# Patient Record
Sex: Female | Born: 1975
Health system: Southern US, Community
[De-identification: ages and names within clinical notes are randomized; demographics above are authoritative.]

## PROBLEM LIST (undated history)

## (undated) DIAGNOSIS — D509 Iron deficiency anemia, unspecified: Secondary | ICD-10-CM

## (undated) DIAGNOSIS — F419 Anxiety disorder, unspecified: Secondary | ICD-10-CM

## (undated) DIAGNOSIS — K5909 Other constipation: Secondary | ICD-10-CM

## (undated) DIAGNOSIS — E559 Vitamin D deficiency, unspecified: Secondary | ICD-10-CM

## (undated) DIAGNOSIS — E039 Hypothyroidism, unspecified: Secondary | ICD-10-CM

## (undated) DIAGNOSIS — G43909 Migraine, unspecified, not intractable, without status migrainosus: Secondary | ICD-10-CM

## (undated) DIAGNOSIS — T7840XA Allergy, unspecified, initial encounter: Secondary | ICD-10-CM

## (undated) DIAGNOSIS — L509 Urticaria, unspecified: Secondary | ICD-10-CM

## (undated) HISTORY — DX: Iron deficiency anemia, unspecified: D50.9

## (undated) HISTORY — DX: Vitamin D deficiency, unspecified: E55.9

## (undated) HISTORY — DX: Anxiety disorder, unspecified: F41.9

## (undated) HISTORY — DX: Allergy, unspecified, initial encounter: T78.40XA

## (undated) HISTORY — PX: NOSE SURGERY: SHX723

## (undated) HISTORY — DX: Urticaria, unspecified: L50.9

## (undated) HISTORY — DX: Hypothyroidism, unspecified: E03.9

## (undated) HISTORY — PX: BREAST BIOPSY: SHX20

---

## 2006-11-01 ENCOUNTER — Emergency Department (HOSPITAL_COMMUNITY): Admission: EM | Admit: 2006-11-01 | Discharge: 2006-11-01 | Payer: Self-pay | Admitting: Emergency Medicine

## 2006-11-16 ENCOUNTER — Other Ambulatory Visit: Admission: RE | Admit: 2006-11-16 | Discharge: 2006-11-16 | Payer: Self-pay | Admitting: Gynecology

## 2006-11-21 ENCOUNTER — Encounter: Admission: RE | Admit: 2006-11-21 | Discharge: 2006-11-21 | Payer: Self-pay | Admitting: Gynecology

## 2008-04-17 ENCOUNTER — Emergency Department (HOSPITAL_COMMUNITY): Admission: EM | Admit: 2008-04-17 | Discharge: 2008-04-17 | Payer: Self-pay | Admitting: Emergency Medicine

## 2010-05-06 ENCOUNTER — Encounter: Admission: RE | Admit: 2010-05-06 | Discharge: 2010-05-06 | Payer: Self-pay | Admitting: Infectious Diseases

## 2010-07-09 ENCOUNTER — Encounter: Admission: RE | Admit: 2010-07-09 | Discharge: 2010-07-09 | Payer: Self-pay | Admitting: Infectious Diseases

## 2011-08-04 LAB — URINALYSIS, ROUTINE W REFLEX MICROSCOPIC
Bilirubin Urine: NEGATIVE
Glucose, UA: NEGATIVE
Hgb urine dipstick: NEGATIVE
Ketones, ur: 15 — AB
Nitrite: NEGATIVE
Protein, ur: NEGATIVE
Specific Gravity, Urine: 1.015
Urobilinogen, UA: 0.2
pH: 6.5

## 2012-07-24 ENCOUNTER — Encounter (HOSPITAL_COMMUNITY): Payer: Self-pay

## 2012-07-24 ENCOUNTER — Emergency Department (HOSPITAL_COMMUNITY)
Admission: EM | Admit: 2012-07-24 | Discharge: 2012-07-24 | Disposition: A | Discharge status: Home / Self Care | Payer: Self-pay | Attending: Emergency Medicine | Admitting: Emergency Medicine

## 2012-07-24 DIAGNOSIS — K5900 Constipation, unspecified: Secondary | ICD-10-CM | POA: Insufficient documentation

## 2012-07-24 DIAGNOSIS — N390 Urinary tract infection, site not specified: Secondary | ICD-10-CM | POA: Insufficient documentation

## 2012-07-24 DIAGNOSIS — Z809 Family history of malignant neoplasm, unspecified: Secondary | ICD-10-CM | POA: Insufficient documentation

## 2012-07-24 DIAGNOSIS — Z8489 Family history of other specified conditions: Secondary | ICD-10-CM | POA: Insufficient documentation

## 2012-07-24 DIAGNOSIS — R112 Nausea with vomiting, unspecified: Secondary | ICD-10-CM | POA: Insufficient documentation

## 2012-07-24 DIAGNOSIS — Z888 Allergy status to other drugs, medicaments and biological substances status: Secondary | ICD-10-CM | POA: Insufficient documentation

## 2012-07-24 DIAGNOSIS — Z8249 Family history of ischemic heart disease and other diseases of the circulatory system: Secondary | ICD-10-CM | POA: Insufficient documentation

## 2012-07-24 DIAGNOSIS — R109 Unspecified abdominal pain: Secondary | ICD-10-CM | POA: Insufficient documentation

## 2012-07-24 HISTORY — DX: Migraine, unspecified, not intractable, without status migrainosus: G43.909

## 2012-07-24 LAB — CBC WITH DIFFERENTIAL/PLATELET
Basophils Absolute: 0 10*3/uL (ref 0.0–0.1)
Basophils Relative: 0 % (ref 0–1)
Eosinophils Absolute: 0.1 10*3/uL (ref 0.0–0.7)
Eosinophils Relative: 0 % (ref 0–5)
HCT: 40 % (ref 36.0–46.0)
Hemoglobin: 13.8 g/dL (ref 12.0–15.0)
Lymphocytes Relative: 9 % — ABNORMAL LOW (ref 12–46)
Lymphs Abs: 1 10*3/uL (ref 0.7–4.0)
MCH: 30.8 pg (ref 26.0–34.0)
MCHC: 34.5 g/dL (ref 30.0–36.0)
MCV: 89.3 fL (ref 78.0–100.0)
Monocytes Absolute: 1 10*3/uL (ref 0.1–1.0)
Monocytes Relative: 9 % (ref 3–12)
Neutro Abs: 9.1 10*3/uL — ABNORMAL HIGH (ref 1.7–7.7)
Neutrophils Relative %: 82 % — ABNORMAL HIGH (ref 43–77)
Platelets: 240 10*3/uL (ref 150–400)
RBC: 4.48 MIL/uL (ref 3.87–5.11)
RDW: 12.1 % (ref 11.5–15.5)
WBC: 11.2 10*3/uL — ABNORMAL HIGH (ref 4.0–10.5)

## 2012-07-24 LAB — COMPREHENSIVE METABOLIC PANEL
ALT: 16 U/L (ref 0–35)
AST: 17 U/L (ref 0–37)
Albumin: 4 g/dL (ref 3.5–5.2)
Alkaline Phosphatase: 98 U/L (ref 39–117)
BUN: 10 mg/dL (ref 6–23)
CO2: 28 mEq/L (ref 19–32)
Calcium: 8.9 mg/dL (ref 8.4–10.5)
Chloride: 94 mEq/L — ABNORMAL LOW (ref 96–112)
Creatinine, Ser: 0.57 mg/dL (ref 0.50–1.10)
GFR calc Af Amer: >90 mL/min (ref >90)
GFR calc non Af Amer: >90 mL/min (ref >90)
Glucose, Bld: 131 mg/dL — ABNORMAL HIGH (ref 70–99)
Potassium: 3.5 mEq/L (ref 3.5–5.1)
Sodium: 134 mEq/L — ABNORMAL LOW (ref 135–145)
Total Bilirubin: 1.4 mg/dL — ABNORMAL HIGH (ref 0.3–1.2)
Total Protein: 7.6 g/dL (ref 6.0–8.3)

## 2012-07-24 LAB — URINALYSIS, MICROSCOPIC ONLY
Glucose, UA: NEGATIVE mg/dL
Hgb urine dipstick: NEGATIVE
Ketones, ur: >80 mg/dL — AB
Nitrite: NEGATIVE
Protein, ur: 30 mg/dL — AB
Specific Gravity, Urine: 1.025 (ref 1.005–1.030)
Urobilinogen, UA: 1 mg/dL (ref 0.0–1.0)
pH: 6 (ref 5.0–8.0)

## 2012-07-24 LAB — LIPASE, BLOOD: Lipase: 15 U/L (ref 11–59)

## 2012-07-24 LAB — POCT PREGNANCY, URINE: Preg Test, Ur: NEGATIVE

## 2012-07-24 MED ORDER — DEXTROSE 5 % AND 0.9 % NACL IV BOLUS
1000.0000 mL | Freq: Once | INTRAVENOUS | Status: AC
  Administered 2012-07-24: 1000 mL via INTRAVENOUS

## 2012-07-24 MED ORDER — CIPROFLOXACIN HCL 500 MG PO TABS: 500.0000 mg | ORAL_TABLET | Freq: Two times a day (BID) | ORAL | Status: DC

## 2012-07-24 MED ORDER — SODIUM CHLORIDE 0.9 % IV BOLUS (SEPSIS)
1000.0000 mL | Freq: Once | INTRAVENOUS | Status: DC
  Administered 2012-07-24: 1000 mL via INTRAVENOUS

## 2012-07-24 MED ORDER — HYDROMORPHONE HCL PF 1 MG/ML IJ SOLN
1.0000 mg | Freq: Once | INTRAMUSCULAR | Status: AC
  Administered 2012-07-24: 1 mg via INTRAVENOUS
  Filled 2012-07-24: qty 1

## 2012-07-24 MED ORDER — PANTOPRAZOLE SODIUM 40 MG IV SOLR
40.0000 mg | Freq: Once | INTRAVENOUS | Status: AC
  Administered 2012-07-24: 40 mg via INTRAVENOUS
  Filled 2012-07-24: qty 40

## 2012-07-24 MED ORDER — DEXTROSE 5 % IV SOLN
1.0000 g | Freq: Once | INTRAVENOUS | Status: AC
  Administered 2012-07-24: 1 g via INTRAVENOUS
  Filled 2012-07-24: qty 10

## 2012-07-24 MED ORDER — ONDANSETRON 8 MG/NS 50 ML IVPB
8.0000 mg | Freq: Once | INTRAVENOUS | Status: AC
  Administered 2012-07-24: 8 mg via INTRAVENOUS
  Filled 2012-07-24: qty 8

## 2012-07-24 MED ORDER — ONDANSETRON HCL 4 MG PO TABS: 4.0000 mg | ORAL_TABLET | Freq: Four times a day (QID) | ORAL | Status: DC | PRN

## 2012-07-24 MED ORDER — ONDANSETRON HCL 4 MG/2ML IJ SOLN
4.0000 mg | Freq: Once | INTRAMUSCULAR | Status: AC
  Administered 2012-07-24: 4 mg via INTRAVENOUS
  Filled 2012-07-24: qty 2

## 2012-07-24 NOTE — ED Notes (Addendum)
Pt states nausea is lessened.  Asked for water.  Gave water, will see if pt able to tolerate

## 2012-07-24 NOTE — ED Notes (Signed)
Pt presents with no acute distress.  Pt reports vomiting since Sunday.  Denies Diarrhea and fever-  Epigastric pain

## 2012-07-25 ENCOUNTER — Encounter (HOSPITAL_COMMUNITY): Payer: Self-pay | Admitting: *Deleted

## 2012-07-25 ENCOUNTER — Emergency Department (HOSPITAL_COMMUNITY): Payer: Self-pay

## 2012-07-25 ENCOUNTER — Inpatient Hospital Stay (HOSPITAL_COMMUNITY)
Admission: EM | Admit: 2012-07-25 | Discharge: 2012-07-26 | DRG: 392 | Disposition: A | Discharge status: Home / Self Care | Payer: MEDICAID | Attending: Internal Medicine | Admitting: Internal Medicine

## 2012-07-25 DIAGNOSIS — R51 Headache: Secondary | ICD-10-CM | POA: Diagnosis present

## 2012-07-25 DIAGNOSIS — Z79899 Other long term (current) drug therapy: Secondary | ICD-10-CM

## 2012-07-25 DIAGNOSIS — R109 Unspecified abdominal pain: Secondary | ICD-10-CM

## 2012-07-25 DIAGNOSIS — K838 Other specified diseases of biliary tract: Secondary | ICD-10-CM

## 2012-07-25 DIAGNOSIS — K5900 Constipation, unspecified: Secondary | ICD-10-CM | POA: Diagnosis present

## 2012-07-25 DIAGNOSIS — A088 Other specified intestinal infections: Principal | ICD-10-CM | POA: Diagnosis present

## 2012-07-25 DIAGNOSIS — K2970 Gastritis, unspecified, without bleeding: Secondary | ICD-10-CM

## 2012-07-25 DIAGNOSIS — E876 Hypokalemia: Secondary | ICD-10-CM

## 2012-07-25 DIAGNOSIS — R112 Nausea with vomiting, unspecified: Secondary | ICD-10-CM

## 2012-07-25 HISTORY — DX: Other constipation: K59.09

## 2012-07-25 LAB — COMPREHENSIVE METABOLIC PANEL
ALT: 25 U/L (ref 0–35)
AST: 26 U/L (ref 0–37)
Albumin: 3.3 g/dL — ABNORMAL LOW (ref 3.5–5.2)
Alkaline Phosphatase: 86 U/L (ref 39–117)
BUN: 5 mg/dL — ABNORMAL LOW (ref 6–23)
CO2: 26 mEq/L (ref 19–32)
Calcium: 7.9 mg/dL — ABNORMAL LOW (ref 8.4–10.5)
Chloride: 99 mEq/L (ref 96–112)
Creatinine, Ser: 0.54 mg/dL (ref 0.50–1.10)
GFR calc Af Amer: >90 mL/min (ref >90)
GFR calc non Af Amer: >90 mL/min (ref >90)
Glucose, Bld: 92 mg/dL (ref 70–99)
Potassium: 3.2 mEq/L — ABNORMAL LOW (ref 3.5–5.1)
Sodium: 135 mEq/L (ref 135–145)
Total Bilirubin: 0.9 mg/dL (ref 0.3–1.2)
Total Protein: 6.4 g/dL (ref 6.0–8.3)

## 2012-07-25 LAB — CBC WITH DIFFERENTIAL/PLATELET
Basophils Absolute: 0 10*3/uL (ref 0.0–0.1)
Basophils Relative: 0 % (ref 0–1)
Eosinophils Absolute: 0.1 10*3/uL (ref 0.0–0.7)
Eosinophils Relative: 2 % (ref 0–5)
HCT: 35.9 % — ABNORMAL LOW (ref 36.0–46.0)
Hemoglobin: 12.7 g/dL (ref 12.0–15.0)
Lymphocytes Relative: 13 % (ref 12–46)
Lymphs Abs: 1.2 10*3/uL (ref 0.7–4.0)
MCH: 31.5 pg (ref 26.0–34.0)
MCHC: 35.4 g/dL (ref 30.0–36.0)
MCV: 89.1 fL (ref 78.0–100.0)
Monocytes Absolute: 1 10*3/uL (ref 0.1–1.0)
Monocytes Relative: 10 % (ref 3–12)
Neutro Abs: 7.3 10*3/uL (ref 1.7–7.7)
Neutrophils Relative %: 75 % (ref 43–77)
Platelets: 158 10*3/uL (ref 150–400)
RBC: 4.03 MIL/uL (ref 3.87–5.11)
RDW: 12.4 % (ref 11.5–15.5)
WBC: 9.7 10*3/uL (ref 4.0–10.5)

## 2012-07-25 LAB — LIPASE, BLOOD: Lipase: 13 U/L (ref 11–59)

## 2012-07-25 MED ORDER — SODIUM CHLORIDE 0.9 % IV SOLN
INTRAVENOUS | Status: AC
  Administered 2012-07-25 – 2012-07-26 (×2): via INTRAVENOUS

## 2012-07-25 MED ORDER — ONDANSETRON HCL 4 MG/2ML IJ SOLN
4.0000 mg | Freq: Once | INTRAMUSCULAR | Status: AC
  Administered 2012-07-25: 4 mg via INTRAVENOUS
  Filled 2012-07-25: qty 2

## 2012-07-25 MED ORDER — SODIUM CHLORIDE 0.9 % IV BOLUS (SEPSIS)
1000.0000 mL | Freq: Once | INTRAVENOUS | Status: AC
  Administered 2012-07-25: 1000 mL via INTRAVENOUS

## 2012-07-25 MED ORDER — ONDANSETRON 8 MG PO TBDP
ORAL_TABLET | ORAL | Status: AC
  Filled 2012-07-25: qty 1

## 2012-07-25 MED ORDER — IOHEXOL 300 MG/ML  SOLN
100.0000 mL | Freq: Once | INTRAMUSCULAR | Status: AC | PRN
  Administered 2012-07-25: 100 mL via INTRAVENOUS

## 2012-07-25 MED ORDER — PROMETHAZINE HCL 25 MG/ML IJ SOLN
25.0000 mg | Freq: Once | INTRAMUSCULAR | Status: AC
  Administered 2012-07-25: 25 mg via INTRAVENOUS
  Filled 2012-07-25: qty 1

## 2012-07-25 MED ORDER — PROMETHAZINE HCL 25 MG/ML IJ SOLN
25.0000 mg | INTRAMUSCULAR | Status: DC | PRN
  Administered 2012-07-26 (×3): 25 mg via INTRAVENOUS
  Filled 2012-07-25 (×3): qty 1

## 2012-07-25 MED ORDER — FAMOTIDINE IN NACL 20-0.9 MG/50ML-% IV SOLN
20.0000 mg | Freq: Once | INTRAVENOUS | Status: AC
  Administered 2012-07-25: 20 mg via INTRAVENOUS
  Filled 2012-07-25: qty 50

## 2012-07-25 MED ORDER — GI COCKTAIL ~~LOC~~
30.0000 mL | Freq: Once | ORAL | Status: AC
  Administered 2012-07-25: 30 mL via ORAL
  Filled 2012-07-25: qty 30

## 2012-07-25 MED ORDER — HYDROMORPHONE HCL PF 1 MG/ML IJ SOLN
1.0000 mg | Freq: Once | INTRAMUSCULAR | Status: AC
  Administered 2012-07-25: 1 mg via INTRAVENOUS
  Filled 2012-07-25: qty 1

## 2012-07-25 MED ORDER — ONDANSETRON 8 MG PO TBDP
8.0000 mg | ORAL_TABLET | Freq: Once | ORAL | Status: AC
  Administered 2012-07-25: 8 mg via ORAL

## 2012-07-25 NOTE — ED Notes (Signed)
Pt c/o abd pain a/w vomiting since Sunday. States pain is epigastric and sharp in nature. States was seen last night for same with no improvement in symptoms with prescribed medications.

## 2012-07-25 NOTE — ED Provider Notes (Signed)
History     CSN: [id number]  Arrival date & time 07/25/12  1310   First MD Initiated Contact with Patient 07/25/12 1445      Chief Complaint  Patient presents with  . Emesis  . Abdominal Pain    (Consider location/radiation/quality/duration/timing/severity/associated sxs/prior treatment) HPI Comments: 36 y/o female presents to the ED with worsening abdominal pain, nausea and vomiting since leaving the ED yesterday. Symptoms began on Sunday. She was diagnosed with a UTI and began taking cipro. Also given zofran which is not helping. Describes pain as constant, non radiating, sharp and cramping. She cannot eat or drink anything without vomiting. Admits to dysuria and increased frequency which is beginning to improve. On Saturday she ate [place] food and has not eaten anything since. Admits to eating fatty foods. Denies fever or chills.  Patient is a 36 y.o. female presenting with vomiting and abdominal pain. The history is provided by the patient.  Emesis  Associated symptoms include abdominal pain and headaches. Pertinent negatives include no chills and no fever.  Abdominal Pain The primary symptoms of the illness include abdominal pain, fatigue, shortness of breath, vomiting and dysuria. The primary symptoms of the illness do not include fever.  The dysuria is associated with frequency. The dysuria is not associated with hematuria.   Additional symptoms associated with the illness include frequency. Symptoms associated with the illness do not include chills, hematuria or back pain.    Past Medical History  Diagnosis Date  . Migraine     History reviewed. No pertinent past surgical history.  History reviewed. No pertinent family history.  History  Substance Use Topics  . Smoking status: Never Smoker   . Smokeless tobacco: Not on file  . Alcohol Use: Yes    OB History    Grav Para Term Preterm Abortions TAB SAB Ect Mult Living                  Review of Systems    Constitutional: Positive for appetite change and fatigue. Negative for fever and chills.  HENT: Negative for neck pain and neck stiffness.   Respiratory: Positive for shortness of breath.   Gastrointestinal: Positive for vomiting and abdominal pain.  Genitourinary: Positive for dysuria and frequency. Negative for hematuria, flank pain and pelvic pain.  Musculoskeletal: Negative for back pain.  Skin: Negative for color change.  Neurological: Positive for weakness and headaches. Negative for dizziness and light-headedness.  Psychiatric/Behavioral: Negative for confusion.    Allergies  Motrin  Home Medications   Current Outpatient Rx  Name Route Sig Dispense Refill  . CETIRIZINE HCL 10 MG PO TABS Oral Take 10 mg by mouth daily.    Marland Kitchen CIPROFLOXACIN HCL 500 MG PO TABS Oral Take 1 tablet (500 mg total) by mouth every 12 (twelve) hours. 8 tablet 0  . DIMENHYDRINATE 50 MG PO TABS Oral Take 50 mg by mouth every 6 (six) hours as needed. nausea    . ONDANSETRON HCL 4 MG PO TABS Oral Take 1 tablet (4 mg total) by mouth every 6 (six) hours as needed for nausea. 20 tablet 0    BP 103/62  Pulse 68  Temp 98.5 F (36.9 C) (Oral)  Resp 18  Ht 5' (1.524 m)  Wt 135 lb (61.236 kg)  BMI 26.37 kg/m2  SpO2 100%  Physical Exam  Constitutional: She is oriented to person, place, and time. She appears well-developed and well-nourished. No distress.  HENT:  Head: Normocephalic and atraumatic.  Mouth/Throat: Oropharynx  is clear and moist.  Eyes: Conjunctivae normal are normal.  Neck: Normal range of motion. Neck supple.  Cardiovascular: Normal rate, regular rhythm, normal heart sounds and intact distal pulses.   Pulmonary/Chest: Effort normal and breath sounds normal. She has no wheezes.  Abdominal: Soft. Normal appearance and bowel sounds are normal. There is tenderness in the right upper quadrant and epigastric area. There is positive Murphy's sign. There is no rigidity, no rebound, no guarding and  no CVA tenderness.  Musculoskeletal: Normal range of motion. She exhibits no edema.  Neurological: She is alert and oriented to person, place, and time.  Skin: Skin is warm and dry. No rash noted. She is not diaphoretic. No pallor.  Psychiatric: She has a normal mood and affect. Her behavior is normal.    ED Course  Procedures (including critical care time)  Labs Reviewed - No data to display Results for orders placed during the hospital encounter of 07/25/12  CBC WITH DIFFERENTIAL      Component Value Range   WBC 9.7  4.0 - 10.5 K/uL   RBC 4.03  3.87 - 5.11 MIL/uL   Hemoglobin 12.7  12.0 - 15.0 g/dL   HCT 81.1 (*) 91.4 - 78.2 %   MCV 89.1  78.0 - 100.0 fL   MCH 31.5  26.0 - 34.0 pg   MCHC 35.4  30.0 - 36.0 g/dL   RDW 95.6  21.3 - 08.6 %   Platelets 158  150 - 400 K/uL   Neutrophils Relative 75  43 - 77 %   Neutro Abs 7.3  1.7 - 7.7 K/uL   Lymphocytes Relative 13  12 - 46 %   Lymphs Abs 1.2  0.7 - 4.0 K/uL   Monocytes Relative 10  3 - 12 %   Monocytes Absolute 1.0  0.1 - 1.0 K/uL   Eosinophils Relative 2  0 - 5 %   Eosinophils Absolute 0.1  0.0 - 0.7 K/uL   Basophils Relative 0  0 - 1 %   Basophils Absolute 0.0  0.0 - 0.1 K/uL  COMPREHENSIVE METABOLIC PANEL      Component Value Range   Sodium 135  135 - 145 mEq/L   Potassium 3.2 (*) 3.5 - 5.1 mEq/L   Chloride 99  96 - 112 mEq/L   CO2 26  19 - 32 mEq/L   Glucose, Bld 92  70 - 99 mg/dL   BUN 5 (*) 6 - 23 mg/dL   Creatinine, Ser 5.78  0.50 - 1.10 mg/dL   Calcium 7.9 (*) 8.4 - 10.5 mg/dL   Total Protein 6.4  6.0 - 8.3 g/dL   Albumin 3.3 (*) 3.5 - 5.2 g/dL   AST 26  0 - 37 U/L   ALT 25  0 - 35 U/L   Alkaline Phosphatase 86  39 - 117 U/L   Total Bilirubin 0.9  0.3 - 1.2 mg/dL   GFR calc non Af Amer >90  >90 mL/min   GFR calc Af Amer >90  >90 mL/min  LIPASE, BLOOD      Component Value Range   Lipase 13  11 - 59 U/L    US Abdomen Complete  07/25/2012  *RADIOLOGY REPORT*  Clinical Data:  Abdominal pain.  Nausea and  vomiting the  ABDOMINAL ULTRASOUND COMPLETE  Comparison:  None.  Findings:  Gallbladder:  Several small less than 6 mm polyps are identified within the lumen of the gallbladder.  Sludge is also noted within the gallbladder.  No gallstones present.  No wall thickening or pericholecystic fluid.  Negative sonographic Murphy's sign.  Common Bile Duct:  Within normal limits in caliber.  Liver: No focal mass lesion identified.  Within normal limits in parenchymal echogenicity.  IVC:  Appears normal.  Pancreas:  No abnormality identified.  Spleen:  Within normal limits in size and echotexture.  Right kidney:  Normal in size and parenchymal echogenicity.  No evidence of mass or hydronephrosis.  Left kidney:  Normal in size and parenchymal echogenicity.  No evidence of mass or hydronephrosis.  Abdominal Aorta:  No aneurysm identified.  IMPRESSION:  1.  No acute findings. 2.  Gallbladder sludge and polyps.   Original Report Authenticated By: Rosealee Albee, M.D.      No diagnosis found.    MDM  36 y/o female with abdominal pain, nausea and vomiting. Abdominal US showing gallbladder sludge and polyps. Labs without concern for infection. Will manage pain and nausea in ED.  5:37 PM Pain and nausea improving with dilaudid and zofran, but not completely. Will CT scan abdomen/pelvis to r/o any other cause of pain including appendicitis. If CT normal, will d/c home with pain control, nausea control, and f/u with GI. 8:05 PM Case discussed with Schinlever, PA-C who will take over care of patient at this time.     Trevor Mace, PA-C 07/25/12 2005

## 2012-07-25 NOTE — ED Provider Notes (Signed)
Pt received from [place], [place].  CT abd/pelvis unremarkable.  Results discussed w/ pt.  She reports that the nausea medication she was prescribed yesterday and received here in the ED is not controlling her symptoms.   On re-examination, tenderness across upper abdomen, reported to be worst in epigastrium.  I suspect that patient has gastritis based on exam and her report that the pain radiates into the center of her chest and she vomits almost immediately after taking any pos.  Will treat w/ another NS bolus, phenergan, pepcid and GI cocktail and reassess shortly.  9:23 PM   Pt reports that pain is mildly improved, but she continues to feel nauseous, particularly when she attempts to drink water.  She is requesting admission because this is her second ER visit and she has not had relief w/ phenergan and zofran at home.  [facility] consulted to admit.  11:21 PM   Otilio Miu, PA 07/26/12 [phone]

## 2012-07-25 NOTE — Progress Notes (Signed)
Pt confirms no pcp self pay Cm provided pt with written information for self pay pcp, medication and financial resources  

## 2012-07-26 ENCOUNTER — Encounter (HOSPITAL_COMMUNITY): Payer: Self-pay | Admitting: Internal Medicine

## 2012-07-26 DIAGNOSIS — K2970 Gastritis, unspecified, without bleeding: Secondary | ICD-10-CM

## 2012-07-26 DIAGNOSIS — R112 Nausea with vomiting, unspecified: Secondary | ICD-10-CM | POA: Diagnosis present

## 2012-07-26 DIAGNOSIS — E876 Hypokalemia: Secondary | ICD-10-CM | POA: Diagnosis present

## 2012-07-26 DIAGNOSIS — R109 Unspecified abdominal pain: Secondary | ICD-10-CM | POA: Diagnosis present

## 2012-07-26 LAB — URINE CULTURE: Colony Count: 7000

## 2012-07-26 LAB — BASIC METABOLIC PANEL
BUN: 3 mg/dL — ABNORMAL LOW (ref 6–23)
CO2: 27 mEq/L (ref 19–32)
Calcium: 7.3 mg/dL — ABNORMAL LOW (ref 8.4–10.5)
Chloride: 102 mEq/L (ref 96–112)
Creatinine, Ser: 0.62 mg/dL (ref 0.50–1.10)
GFR calc Af Amer: >90 mL/min (ref >90)
GFR calc non Af Amer: >90 mL/min (ref >90)
Glucose, Bld: 72 mg/dL (ref 70–99)
Potassium: 3.2 mEq/L — ABNORMAL LOW (ref 3.5–5.1)
Sodium: 136 mEq/L (ref 135–145)

## 2012-07-26 MED ORDER — MORPHINE SULFATE 2 MG/ML IJ SOLN
INTRAMUSCULAR | Status: AC
  Filled 2012-07-26: qty 1

## 2012-07-26 MED ORDER — MORPHINE SULFATE 2 MG/ML IJ SOLN
2.0000 mg | Freq: Once | INTRAMUSCULAR | Status: AC
  Administered 2012-07-26: 2 mg via INTRAVENOUS
  Filled 2012-07-26: qty 1

## 2012-07-26 MED ORDER — GI COCKTAIL ~~LOC~~
30.0000 mL | Freq: Two times a day (BID) | ORAL | Status: DC | PRN
  Filled 2012-07-26: qty 30

## 2012-07-26 MED ORDER — MORPHINE SULFATE 2 MG/ML IJ SOLN
2.0000 mg | Freq: Once | INTRAMUSCULAR | Status: AC
  Administered 2012-07-26: 2 mg via INTRAVENOUS

## 2012-07-26 MED ORDER — PROMETHAZINE HCL 12.5 MG PO TABS: 12.5000 mg | ORAL_TABLET | Freq: Four times a day (QID) | ORAL | Status: DC | PRN

## 2012-07-26 MED ORDER — BUTALBITAL-APAP-CAFFEINE 50-325-40 MG PO TABS: 1.0000 | ORAL_TABLET | ORAL | Status: DC | PRN

## 2012-07-26 MED ORDER — HYDROCODONE-ACETAMINOPHEN 5-500 MG PO TABS: 1.0000 | ORAL_TABLET | Freq: Four times a day (QID) | ORAL | Status: DC | PRN

## 2012-07-26 MED ORDER — PROMETHAZINE HCL 25 MG/ML IJ SOLN
INTRAMUSCULAR | Status: AC
  Filled 2012-07-26: qty 1

## 2012-07-26 MED ORDER — POTASSIUM CHLORIDE 10 MEQ/100ML IV SOLN
10.0000 meq | INTRAVENOUS | Status: DC
  Administered 2012-07-26 (×2): 10 meq via INTRAVENOUS
  Filled 2012-07-26 (×4): qty 100

## 2012-07-26 MED ORDER — CIPROFLOXACIN IN D5W 400 MG/200ML IV SOLN
400.0000 mg | Freq: Two times a day (BID) | INTRAVENOUS | Status: DC
  Administered 2012-07-26 (×2): 400 mg via INTRAVENOUS
  Filled 2012-07-26 (×3): qty 200

## 2012-07-26 MED ORDER — POTASSIUM CHLORIDE CRYS ER 20 MEQ PO TBCR
40.0000 meq | EXTENDED_RELEASE_TABLET | Freq: Once | ORAL | Status: AC
  Administered 2012-07-26: 40 meq via ORAL
  Filled 2012-07-26: qty 2

## 2012-07-26 MED ORDER — SODIUM CHLORIDE 0.9 % IV SOLN: INTRAVENOUS | Status: DC

## 2012-07-26 MED ORDER — POTASSIUM CHLORIDE 10 MEQ/100ML IV SOLN
10.0000 meq | INTRAVENOUS | Status: AC
  Administered 2012-07-26 (×4): 10 meq via INTRAVENOUS
  Filled 2012-07-26 (×4): qty 100

## 2012-07-26 MED ORDER — ONDANSETRON HCL 4 MG/2ML IJ SOLN
4.0000 mg | Freq: Four times a day (QID) | INTRAMUSCULAR | Status: DC | PRN
  Filled 2012-07-26: qty 2

## 2012-07-26 NOTE — Progress Notes (Signed)
Pt c/o of really bad HA related to straining while vomiting.  Notified MD.  Received new orders.  Will continue to monitor pt. Daphene Calamity [place] 01:00 07/26/2012

## 2012-07-26 NOTE — H&P (Signed)
[facility] History and Physical  Lisa Townsend ZOX:[id number] DOB: [birth year] DOA: 07/25/2012  Referring physician: ED PCP: No primary provider on file.   Chief Complaint: Intractable nausea and vomiting  HPI: Lisa Townsend is a 36 y.o. female who was healthy (other than the occasional migraine), until she ate at a [employer] on Sat.  She went home was feeling a little "off" but not having any symptoms when she went to bed Sat night.  Then on Sunday she woke up with severe nausea and vomiting, no diarrhea.  She tried taking 3 pills of doxycycline that they had at home, on Monday with no effect.  After determining that this didn't work, she presented to the ED on Tues, was treated and released on cipro and anti nausea meds.  Unfortunately she has not been able to keep the cipro down, or the anti nausea meds down and has returned today.  In the ED a CT scan of her abdomen was negative, her pain was relieved by a GI coctail (inidicating an upper GI process and source of pain).  Vomiting is nonbilious non bloody in quality, and progresses to dry heaves when she doesn't have anything in her stomach.  She was able to keep a little fluid down PO after zofran and phenergan, but because of her symptoms we have been asked to admit for IV fluids for presumed gastritis.  Review of Systems: Patient denies fever, admits to a few chills, denies SOB, chest pain, diarrhea (actually is constipated but this chronic), does have mild headache, husband ate with the patient on Sat and he is not sick, no sick contacts, 12 systems are reviewed and otherwise negative.  Past Medical History  Diagnosis Date  . Migraine   . Chronic constipation    Past Surgical History  Procedure Date  . Nose surgery    Social History:  reports that she has never smoked. She does not have any smokeless tobacco history on file. She reports that she drinks alcohol. She reports that she does not use illicit drugs. Patient lives  at home, performs all ADLs.  Allergies  Allergen Reactions  . Motrin (Ibuprofen)     itching    Family History  Problem Relation Age of Onset  . Urolithiasis Father   . Cancer Mother   . Hypertension Mother     Prior to Admission medications   Medication Sig Start Date End Date Taking? Authorizing Provider  cetirizine (ZYRTEC) 10 MG tablet Take 10 mg by mouth daily.   Yes Historical Provider, MD  ciprofloxacin (CIPRO) 500 MG tablet Take 1 tablet (500 mg total) by mouth every 12 (twelve) hours. 07/24/12  Yes Raeford Razor, MD  dimenhyDRINATE (DRAMAMINE) 50 MG tablet Take 50 mg by mouth every 6 (six) hours as needed. nausea   Yes Historical Provider, MD  ondansetron (ZOFRAN) 4 MG tablet Take 1 tablet (4 mg total) by mouth every 6 (six) hours as needed for nausea. 07/24/12  Yes Raeford Razor, MD   Physical Exam: Filed Vitals:   07/25/12 1353 07/25/12 1935 07/26/12 0020  BP: 103/62 116/74 112/63  Pulse: 68 77 73  Temp: 98.5 F (36.9 C) 98.2 F (36.8 C) 99.7 F (37.6 C)  TempSrc: Oral Oral Oral  Resp: 18 16 16   Height: 5' (1.524 m)    Weight: 61.236 kg (135 lb)    SpO2: 100% 98% 97%     General:  NAD, resting comfortably in hospital bed  Eyes: PEERLA EOMI, sclera non-icteric  ENT:  moist mucous membranes  Neck: supple w/o JVD  Cardiovascular: RRR w/o MRG  Respiratory: CTA B  Abdomen: soft, mild tenderness diffusely, worst in the epigastric region, no rebound, no guarding, bowel sounds are hypoactive.  Skin: without rash or lesion  Labs on Admission:  Basic Metabolic Panel:  Lab 07/25/12 4782 07/24/12 1136  NA 135 134*  K 3.2* 3.5  CL 99 94*  CO2 26 28  GLUCOSE 92 131*  BUN 5* 10  CREATININE 0.54 0.57  CALCIUM 7.9* 8.9  MG -- --  PHOS -- --   Liver Function Tests:  Lab 07/25/12 1615 07/24/12 1136  AST 26 17  ALT 25 16  ALKPHOS 86 98  BILITOT 0.9 1.4*  PROT 6.4 7.6  ALBUMIN 3.3* 4.0    Lab 07/25/12 1615 07/24/12 1136  LIPASE 13 15  AMYLASE --  --   No results found for this basename: AMMONIA:5 in the last 168 hours CBC:  Lab 07/25/12 1615 07/24/12 1136  WBC 9.7 11.2*  NEUTROABS 7.3 9.1*  HGB 12.7 13.8  HCT 35.9* 40.0  MCV 89.1 89.3  PLT 158 240   Cardiac Enzymes: No results found for this basename: CKTOTAL:5,CKMB:5,CKMBINDEX:5,TROPONINI:5 in the last 168 hours  BNP (last 3 results) No results found for this basename: PROBNP:3 in the last 8760 hours CBG: No results found for this basename: GLUCAP:5 in the last 168 hours  Radiological Exams on Admission: US Abdomen Complete  07/25/2012  *RADIOLOGY REPORT*  Clinical Data:  Abdominal pain.  Nausea and vomiting the  ABDOMINAL ULTRASOUND COMPLETE  Comparison:  None.  Findings:  Gallbladder:  Several small less than 6 mm polyps are identified within the lumen of the gallbladder.  Sludge is also noted within the gallbladder.  No gallstones present.  No wall thickening or pericholecystic fluid.  Negative sonographic Murphy's sign.  Common Bile Duct:  Within normal limits in caliber.  Liver: No focal mass lesion identified.  Within normal limits in parenchymal echogenicity.  IVC:  Appears normal.  Pancreas:  No abnormality identified.  Spleen:  Within normal limits in size and echotexture.  Right kidney:  Normal in size and parenchymal echogenicity.  No evidence of mass or hydronephrosis.  Left kidney:  Normal in size and parenchymal echogenicity.  No evidence of mass or hydronephrosis.  Abdominal Aorta:  No aneurysm identified.  IMPRESSION:  1.  No acute findings. 2.  Gallbladder sludge and polyps.   Original Report Authenticated By: Rosealee Albee, M.D.    Ct Abdomen Pelvis W Contrast  07/25/2012  *RADIOLOGY REPORT*  Clinical Data: Abdominal pain with nausea for past 3 days.  CT ABDOMEN AND PELVIS WITH CONTRAST  Technique:  Multidetector CT imaging of the abdomen and pelvis was performed following the standard protocol during bolus administration of intravenous contrast.  Contrast:  OMNIPAQUE IOHEXOL 300 MG/ML  SOLN  Comparison: None.  Findings: Minimal basilar atelectasis.  No extraluminal bowel inflammatory process, free fluid or free air.  No focal hepatic, splenic, pancreatic, renal or adrenal lesion.  No calcified gallstones.  No abdominal aortic aneurysm.  No adenopathy.  Noncontrast filled views urinary bladder unremarkable.  Bilateral ovarian cysts larger on the right measuring up to 3.4 cm.  Degenerative changes most notable L5-S1.  IMPRESSION: No extraluminal bowel inflammatory process, free fluid or free air.  Bilateral ovarian cysts larger on the right measuring up to 3.4 cm.   Original Report Authenticated By: Fuller Canada, M.D.     Assessment/Plan Principal Problem:  *Nausea  and vomiting Active Problems:  Abdominal pain  Hypokalemia   1. Nausea and vomiting - Likely gastritis with infectious cause / food poisoning from the Sat evening dinner, will admit patient, put patient on IV cipro, also treat nausea with zofran and phenergan if zofran isnt sufficient.  Fluid resuscitation. 2. Abdominal pain - secondary to frequent vomiting, improved with GI cocktail, repeat if needed. 3. Hypokalemia - from frequent vomiting - will replace KCl IV  Code Status: Full code Family Communication: Family members present in room at bedside Disposition Plan: Admit to obs  Time spent: 50 min  GARDNER, JARED M. [facility] Pager [phone]  If 7PM-7AM, please contact night-coverage [email or web address] Password [facility] 07/26/2012, 12:29 AM

## 2012-07-26 NOTE — ED Provider Notes (Signed)
Medical screening examination/treatment/procedure(s) were performed by non-physician practitioner and as supervising physician I was immediately available for consultation/collaboration.   Alleyne Lac, MD 07/26/12 0722 

## 2012-07-26 NOTE — Progress Notes (Signed)
Skin around pt's IV reddened and burning despite lowering rate of K+ IVF and adding a y-site of NS.  Spoke with Dr. Elisabeth Pigeon, who recommended attempting the oral K+ which the pt was unable to take in the morning in place of the IV K+.  Pt tolerated oral K+ when dissolved in applesauce.  Pt stated that she felt ready to go home, and that she could manage her pain and nausea if given rx.  I reviewed her discharge medications and instructions with her, and the pt verbalized understanding.   Levonne Lapping, RN

## 2012-07-26 NOTE — Progress Notes (Signed)
Observation review is complete. 

## 2012-07-26 NOTE — Progress Notes (Signed)
Pt is still c/o of a very bad HA after a few hours of sleep.  Notified the MD.  Received new orders.  Will continue to monitor the pt. Lisa Townsend [place] 05:00 07/26/2012

## 2012-07-26 NOTE — Discharge Summary (Signed)
Physician Discharge Summary  Magdalene River VHQ:[id number] DOB: 07/18/1976 DOA: 07/25/2012  PCP: No primary provider on file.  Admit date: 07/25/2012 Discharge date: 07/26/2012  Recommendations for Outpatient Follow-up:  1. Please follow up with PCP in 1-2 weeks or sooner if symptoms worsen 2. Please note that the potassium has been repleted prior to discharge  Discharge Diagnoses:  Principal Problem:  *Nausea and vomiting Active Problems:  Abdominal pain  Hypokalemia   Discharge Condition: medically stable for discharge home today, patient agrees with discharge plan; please note that the scripts provided for fioricet, vicodin and phenergan  Diet recommendation: as tolerated  History of present illness:  36 year old female with history of migraine headache admitted for nausea and vomiting couple of hours after eating in [employer]. No fever or chills, some abdominal cram-like pain, otherwise no blood in stool or urine.  Assessment and Plan:  Active Problems: Viral gastroenteritis - no fever or chills, no elevated WBC - patient will be discharged home with phenergan as needed; she reported nausea but no vomiting - hemodynamically stable prior to discharge  Hypokalemia - due to GI losses - mild, repleted prior to discharge  Manson Passey [facility] [phone]  Discharge Exam: Filed Vitals:   07/26/12 0610  BP: 98/60  Pulse:   Temp:   Resp:    Filed Vitals:   07/26/12 0020 07/26/12 0051 07/26/12 0551 07/26/12 0610  BP: 112/63 106/72 93/51 98/60   Pulse: 73 65 69   Temp: 99.7 F (37.6 C) 99.1 F (37.3 C) 99.9 F (37.7 C)   TempSrc: Oral Oral Oral   Resp: 16 16 16    Height:  5' (1.524 m)    Weight:  61.236 kg (135 lb)    SpO2: 97% 99% 98%     General: Pt is alert, follows commands appropriately, not in acute distress Cardiovascular: Regular rate and rhythm, S1/S2 +, no murmurs, no rubs, no gallops Respiratory: Clear to auscultation bilaterally, no wheezing, no  crackles, no rhonchi Abdominal: Soft, non tender, non distended, bowel sounds +, no guarding Extremities: no edema, no cyanosis, pulses palpable bilaterally DP and PT Neuro: Grossly nonfocal  Discharge Instructions  Discharge Orders    Future Orders Please Complete By Expires   Diet - low sodium heart healthy      Increase activity slowly      Call MD for:  persistant nausea and vomiting      Call MD for:  severe uncontrolled pain      Call MD for:  persistant dizziness or light-headedness      Call MD for:  extreme fatigue          Medication List     As of 07/26/2012 11:12 AM    STOP taking these medications         ciprofloxacin 500 MG tablet   Commonly known as: CIPRO      ondansetron 4 MG tablet   Commonly known as: ZOFRAN      TAKE these medications         butalbital-acetaminophen-caffeine 50-325-40 MG per tablet   Commonly known as: FIORICET, ESGIC   Take 1 tablet by mouth every 4 (four) hours as needed for headache.      cetirizine 10 MG tablet   Commonly known as: ZYRTEC   Take 10 mg by mouth daily.      dimenhyDRINATE 50 MG tablet   Commonly known as: DRAMAMINE   Take 50 mg by mouth every 6 (six) hours as needed.  nausea      HYDROcodone-acetaminophen 5-500 MG per tablet   Commonly known as: VICODIN   Take 1 tablet by mouth every 6 (six) hours as needed for pain.      promethazine 12.5 MG tablet   Commonly known as: PHENERGAN   Take 1 tablet (12.5 mg total) by mouth every 6 (six) hours as needed for nausea.          The results of significant diagnostics from this hospitalization (including imaging, microbiology, ancillary and laboratory) are listed below for reference.    Significant Diagnostic Studies: US Abdomen Complete  07/25/2012  *RADIOLOGY REPORT*  Clinical Data:  Abdominal pain.  Nausea and vomiting the  ABDOMINAL ULTRASOUND COMPLETE  Comparison:  None.  Findings:  Gallbladder:  Several small less than 6 mm polyps are identified within  the lumen of the gallbladder.  Sludge is also noted within the gallbladder.  No gallstones present.  No wall thickening or pericholecystic fluid.  Negative sonographic Murphy's sign.  Common Bile Duct:  Within normal limits in caliber.  Liver: No focal mass lesion identified.  Within normal limits in parenchymal echogenicity.  IVC:  Appears normal.  Pancreas:  No abnormality identified.  Spleen:  Within normal limits in size and echotexture.  Right kidney:  Normal in size and parenchymal echogenicity.  No evidence of mass or hydronephrosis.  Left kidney:  Normal in size and parenchymal echogenicity.  No evidence of mass or hydronephrosis.  Abdominal Aorta:  No aneurysm identified.  IMPRESSION:  1.  No acute findings. 2.  Gallbladder sludge and polyps.   Original Report Authenticated By: Rosealee Albee, M.D.    Ct Abdomen Pelvis W Contrast  07/25/2012  *RADIOLOGY REPORT*  Clinical Data: Abdominal pain with nausea for past 3 days.  CT ABDOMEN AND PELVIS WITH CONTRAST  Technique:  Multidetector CT imaging of the abdomen and pelvis was performed following the standard protocol during bolus administration of intravenous contrast.  Contrast: OMNIPAQUE IOHEXOL 300 MG/ML  SOLN  Comparison: None.  Findings: Minimal basilar atelectasis.  No extraluminal bowel inflammatory process, free fluid or free air.  No focal hepatic, splenic, pancreatic, renal or adrenal lesion.  No calcified gallstones.  No abdominal aortic aneurysm.  No adenopathy.  Noncontrast filled views urinary bladder unremarkable.  Bilateral ovarian cysts larger on the right measuring up to 3.4 cm.  Degenerative changes most notable L5-S1.  IMPRESSION: No extraluminal bowel inflammatory process, free fluid or free air.  Bilateral ovarian cysts larger on the right measuring up to 3.4 cm.   Original Report Authenticated By: Fuller [place], M.D.     Microbiology: Recent Results (from the past 240 hour(s))  URINE CULTURE     Status: Normal    Collection Time   07/24/12  1:15 PM      Component Value Range Status Comment   Specimen Description URINE, CLEAN CATCH   Final    Special Requests NONE   Final    Culture  Setup Time 07/25/2012 01:18   Final    Colony Count 7,000 COLONIES/ML   Final    Culture INSIGNIFICANT GROWTH   Final    Report Status 07/26/2012 FINAL   Final      Labs: Basic Metabolic Panel:  Lab 07/26/12 1610 07/25/12 1615 07/24/12 1136  NA 136 135 134*  K 3.2* 3.2* 3.5  CL 102 99 94*  CO2 27 26 28   GLUCOSE 72 92 131*  BUN 3* 5* 10  CREATININE 0.62 0.54 0.57  CALCIUM 7.3* 7.9* 8.9  MG -- -- --  PHOS -- -- --   Liver Function Tests:  Lab 07/25/12 1615 07/24/12 1136  AST 26 17  ALT 25 16  ALKPHOS 86 98  BILITOT 0.9 1.4*  PROT 6.4 7.6  ALBUMIN 3.3* 4.0    Lab 07/25/12 1615 07/24/12 1136  LIPASE 13 15  AMYLASE -- --   No results found for this basename: AMMONIA:5 in the last 168 hours CBC:  Lab 07/25/12 1615 07/24/12 1136  WBC 9.7 11.2*  NEUTROABS 7.3 9.1*  HGB 12.7 13.8  HCT 35.9* 40.0  MCV 89.1 89.3  PLT 158 240    Time coordinating discharge: Over 30 minutes  Signed:  Manson Passey, MD  [facility] 07/26/2012, 11:12 AM  Pager #: [phone]

## 2012-07-28 NOTE — ED Provider Notes (Signed)
Medical screening examination/treatment/procedure(s) were performed by non-physician practitioner and as supervising physician I was immediately available for consultation/collaboration.   Celene Kras, MD 07/28/12 [phone]

## 2012-08-03 NOTE — ED Provider Notes (Signed)
History    36 year old female with abdominal pain and nausea and vomiting. Onset around Sunday. Gradual onset. Pain is in the upper abdomen and does not radiate. Waxes and wanes but doesn't completely go away. No appreciable exacerbating relieving factors. No fevers or chills. No diarrhea. No urinary complaints.  CSN: [id number]  Arrival date & time 07/24/12  1225   First MD Initiated Contact with Patient 07/24/12 1329      Chief Complaint  Patient presents with  . Emesis    since sunday  . Abdominal Pain    (Consider location/radiation/quality/duration/timing/severity/associated sxs/prior treatment) HPI  Past Medical History  Diagnosis Date  . Migraine   . Chronic constipation     Past Surgical History  Procedure Date  . Nose surgery     Family History  Problem Relation Age of Onset  . Urolithiasis Father   . Cancer Mother   . Hypertension Mother     History  Substance Use Topics  . Smoking status: Never Smoker   . Smokeless tobacco: Not on file  . Alcohol Use: Yes    OB History    Grav Para Term Preterm Abortions TAB SAB Ect Mult Living                  Review of Systems   Review of symptoms negative unless otherwise noted in HPI.   Allergies  Motrin  Home Medications   Current Outpatient Rx  Name Route Sig Dispense Refill  . DIMENHYDRINATE 50 MG PO TABS Oral Take 50 mg by mouth every 6 (six) hours as needed. nausea    . BUTALBITAL-APAP-CAFFEINE 50-325-40 MG PO TABS Oral Take 1 tablet by mouth every 4 (four) hours as needed for headache. 45 tablet 0  . CETIRIZINE HCL 10 MG PO TABS Oral Take 10 mg by mouth daily.    Marland Kitchen HYDROCODONE-ACETAMINOPHEN 5-500 MG PO TABS Oral Take 1 tablet by mouth every 6 (six) hours as needed for pain. 30 tablet 0  . PROMETHAZINE HCL 12.5 MG PO TABS Oral Take 1 tablet (12.5 mg total) by mouth every 6 (six) hours as needed for nausea. 30 tablet 0    BP 99/58  Pulse 68  Temp 97.8 F (36.6 C) (Oral)  Resp 16  SpO2  100%  Physical Exam  Nursing note and vitals reviewed. Constitutional: She appears well-developed and well-nourished. No distress.  HENT:  Head: Normocephalic and atraumatic.  Eyes: Conjunctivae normal are normal. Right eye exhibits no discharge. Left eye exhibits no discharge.  Neck: Neck supple.  Cardiovascular: Normal rate, regular rhythm and normal heart sounds.  Exam reveals no gallop and no friction rub.   No murmur heard. Pulmonary/Chest: Effort normal and breath sounds normal. No respiratory distress.  Abdominal: Soft. She exhibits no distension and no mass. There is tenderness. There is no rebound and no guarding.       Tenderness in epigastrium without rebound or guarding. No mass palpated. No distention.  Genitourinary:       No costovertebral angle tenderness  Musculoskeletal: She exhibits no edema and no tenderness.  Neurological: She is alert.  Skin: Skin is warm and dry.  Psychiatric: She has a normal mood and affect. Her behavior is normal. Thought content normal.    ED Course  Procedures (including critical care time)  Labs Reviewed  URINALYSIS, WITH MICROSCOPIC - Abnormal; Notable for the following:    Color, Urine ORANGE (*)  BIOCHEMICALS MAY BE AFFECTED BY COLOR   APPearance TURBID (*)  Bilirubin Urine SMALL (*)     Ketones, ur >80 (*)     Protein, ur 30 (*)     Leukocytes, UA MODERATE (*)     Bacteria, UA MANY (*)     Squamous Epithelial / LPF MANY (*)     All other components within normal limits  COMPREHENSIVE METABOLIC PANEL - Abnormal; Notable for the following:    Sodium 134 (*)     Chloride 94 (*)     Glucose, Bld 131 (*)     Total Bilirubin 1.4 (*)     All other components within normal limits  CBC WITH DIFFERENTIAL - Abnormal; Notable for the following:    WBC 11.2 (*)     Neutrophils Relative 82 (*)     Neutro Abs 9.1 (*)     Lymphocytes Relative 9 (*)     All other components within normal limits  POCT PREGNANCY, URINE  LIPASE, BLOOD   URINE CULTURE  LAB REPORT - SCANNED   No results found.   1. Nausea and vomiting   2. Abdominal pain   3. UTI (lower urinary tract infection)       MDM  36 year old female with abdominal pain and nausea vomiting. Suspect gastritis, less likely PUD with location and description of pain. Lipase is normal. No peritoneal signs. UA is suggestive of infection. A lot of ketones in the urine suggestive that patient may indeed be having quite a bit of emesis. Fluids with dextrose was provided. Patient with better control of symptoms prior to discharge. Although patient with little urinary complaints. Antibiotics. Zofran as needed for nausea and vomiting. Return precautions were discussed. Outpatient followup otherwise.        Raeford Razor, MD 08/03/12 1133

## 2013-08-15 ENCOUNTER — Encounter: Payer: Self-pay | Admitting: Internal Medicine

## 2013-09-18 ENCOUNTER — Ambulatory Visit: Payer: Self-pay | Admitting: Internal Medicine

## 2013-09-23 IMAGING — CT CT ABD-PELV W/ CM
1 of 2 series · 16 of 32 positions shown, 20 images · IV contrast (OMNIPAQUE 300)
Comparison: None.

CLINICAL DATA: Abdominal pain with nausea for past 3 days.

CT ABDOMEN AND PELVIS WITH CONTRAST
TECHNIQUE: Multidetector CT imaging of the abdomen and pelvis was
performed following the standard protocol during bolus
administration of intravenous contrast.
Contrast: 100mL OMNIPAQUE IOHEXOL 300 MG/ML  SOLN

[Series 2: abd/pel with · axial · 0.67mm/px · z∈[-616,-216]mm · 16 of 88 slices shown, 20 images]
[im 4/88  soft-tissue]
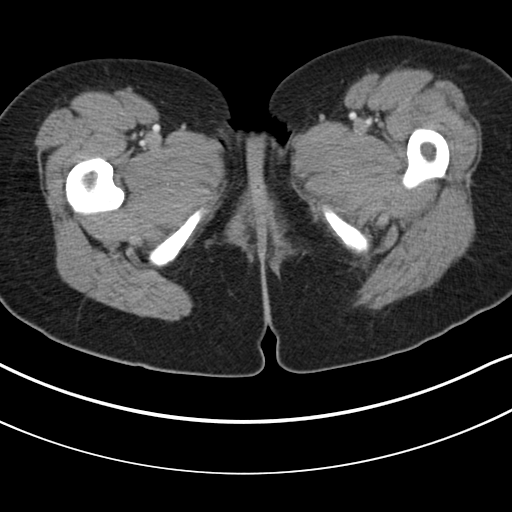
[im 4/88  bone]
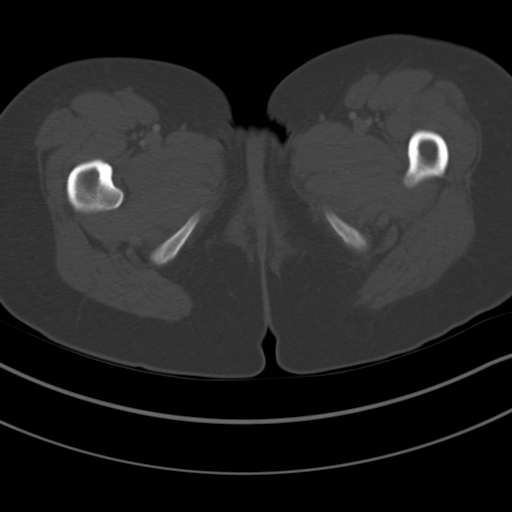
[im 11/88  soft-tissue]
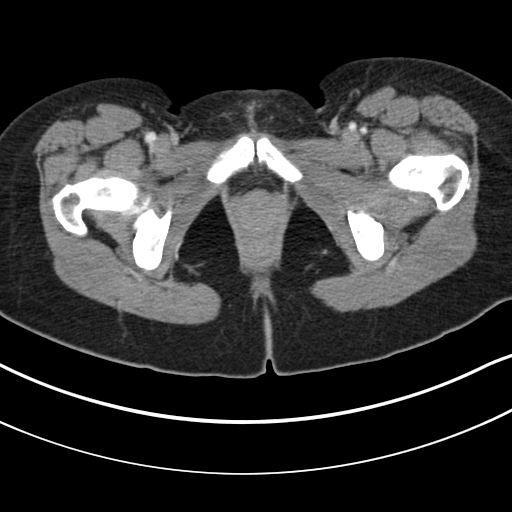
[im 18/88  soft-tissue]
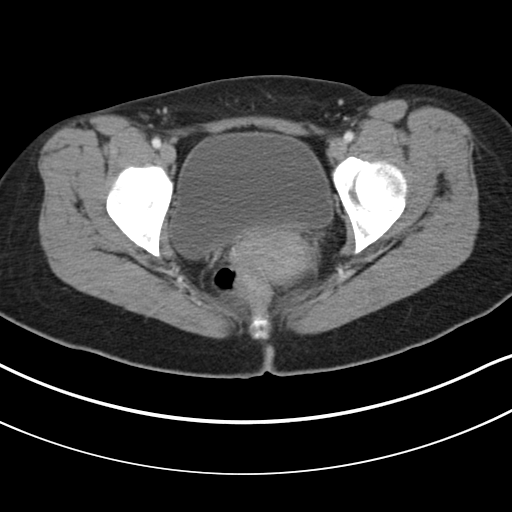
[im 25/88  soft-tissue]
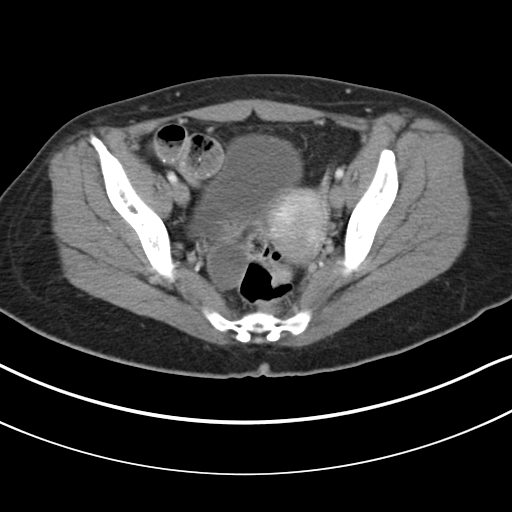
[im 28/88  soft-tissue]
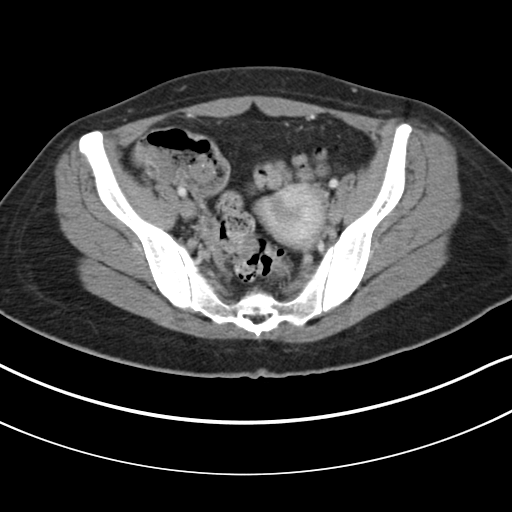
[im 35/88  soft-tissue]
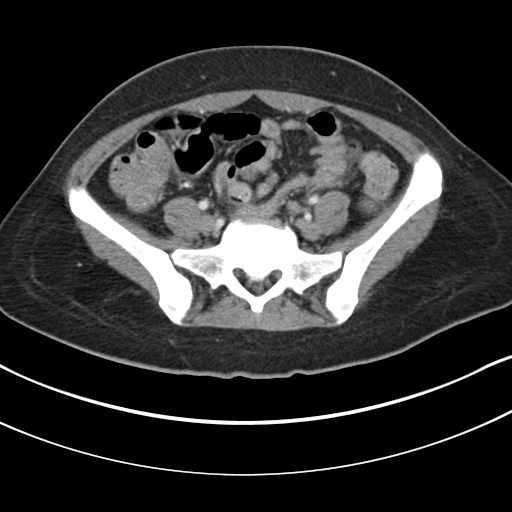
[im 42/88  soft-tissue]
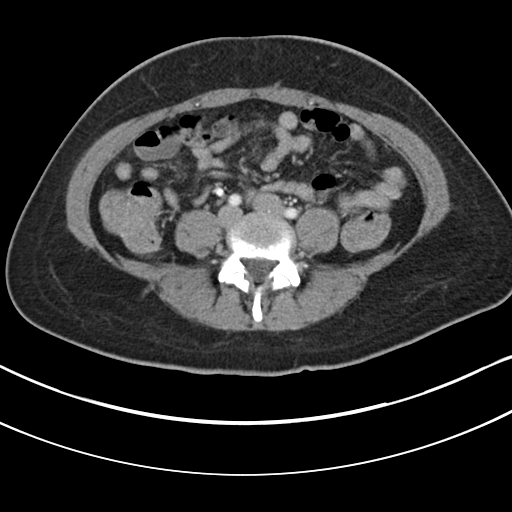
[im 46/88  soft-tissue]
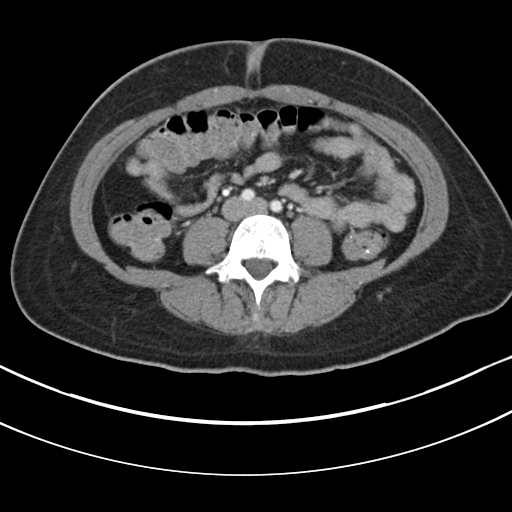
[im 53/88  soft-tissue]
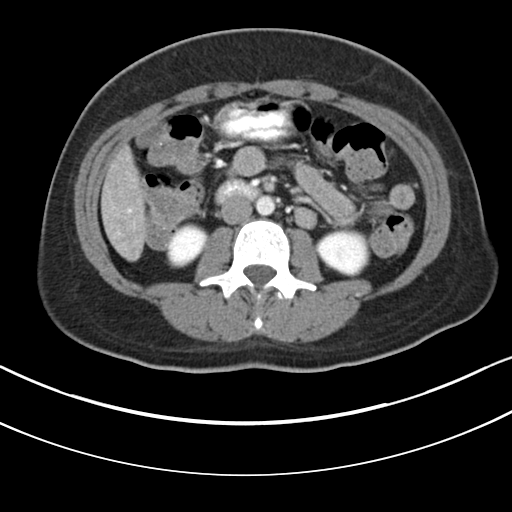
[im 53/88  bone]
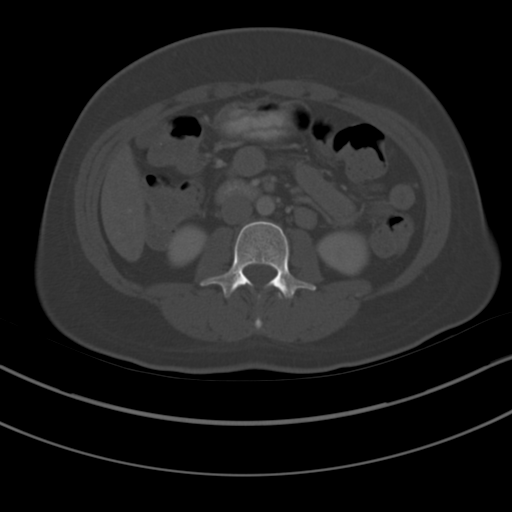
[im 60/88  soft-tissue]
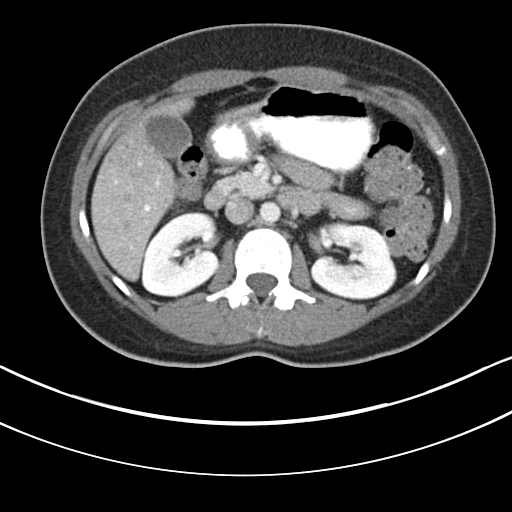
[im 67/88  soft-tissue]
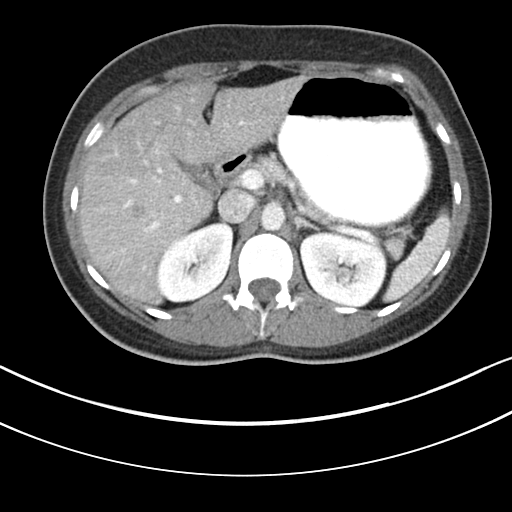
[im 70/88  soft-tissue]
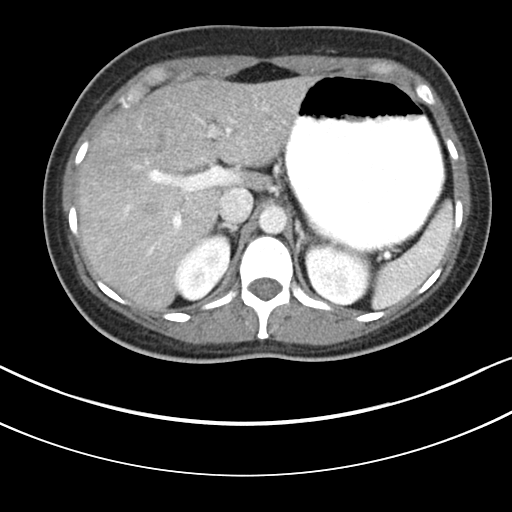
[im 74/88  lung]
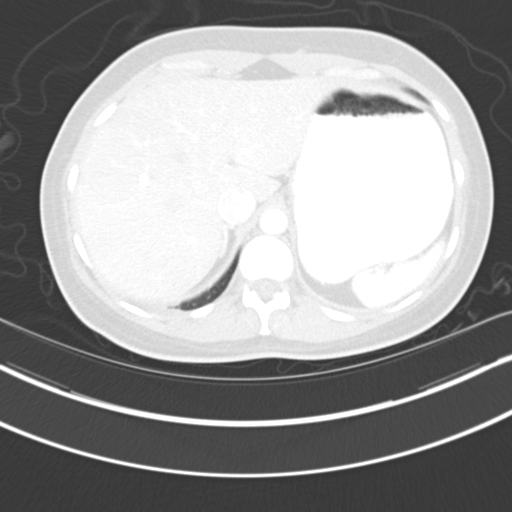
[im 77/88  soft-tissue]
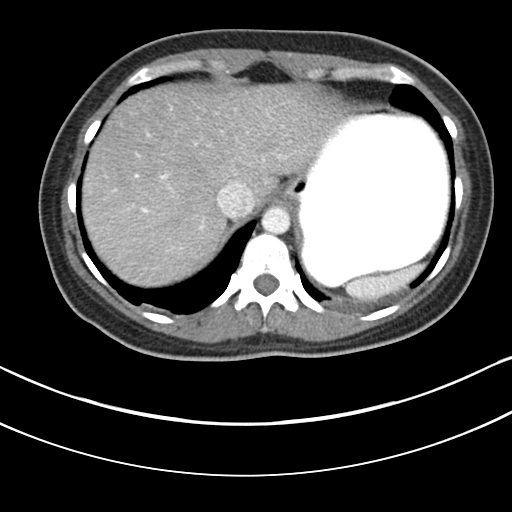
[im 77/88  lung]
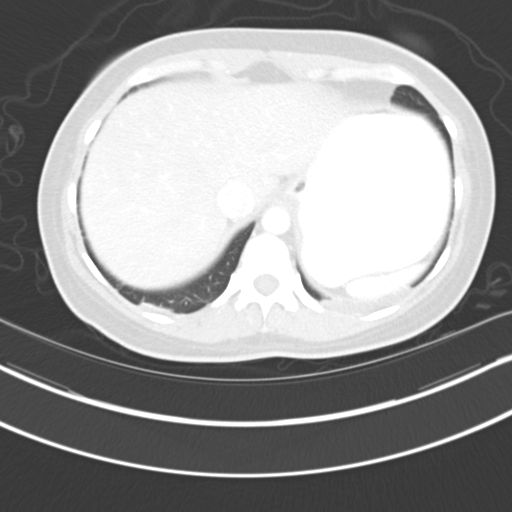
[im 81/88  lung]
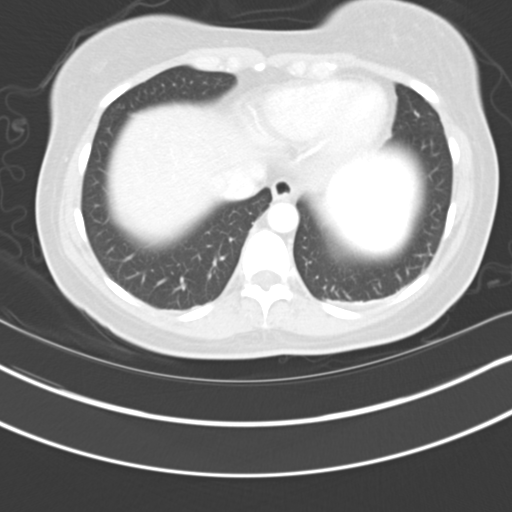
[im 84/88  soft-tissue]
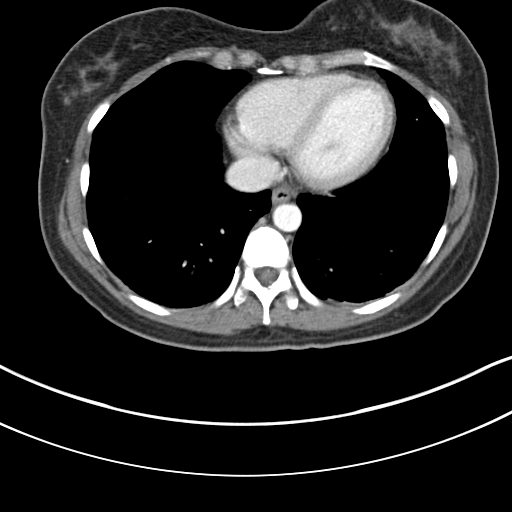
[im 84/88  lung]
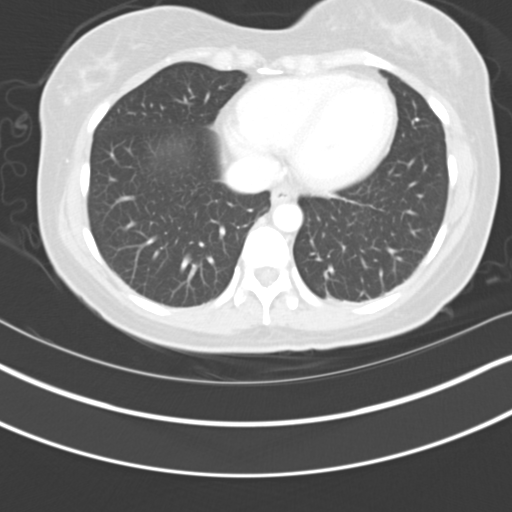

[16 of 32 positions shown; findings below may reference images not displayed]

FINDINGS: Minimal basilar atelectasis.

No extraluminal bowel inflammatory process, free fluid or free air.

No focal hepatic, splenic, pancreatic, renal or adrenal lesion.  No
calcified gallstones.

No abdominal aortic aneurysm.  No adenopathy.

Noncontrast filled views urinary bladder unremarkable.

Bilateral ovarian cysts larger on the right measuring up to 3.4 cm.

Degenerative changes most notable L5-S1.
IMPRESSION: No extraluminal bowel inflammatory process, free fluid or free air.

Bilateral ovarian cysts larger on the right measuring up to 3.4 cm.

## 2013-12-02 ENCOUNTER — Ambulatory Visit: Payer: Self-pay | Admitting: Gynecology

## 2013-12-27 ENCOUNTER — Encounter: Payer: Self-pay | Admitting: Gynecology

## 2013-12-27 ENCOUNTER — Other Ambulatory Visit (HOSPITAL_COMMUNITY)
Admission: RE | Admit: 2013-12-27 | Discharge: 2013-12-27 | Disposition: A | Discharge status: Home / Self Care | Payer: BC Managed Care – PPO | Source: Ambulatory Visit | Attending: Gynecology | Admitting: Gynecology

## 2013-12-27 ENCOUNTER — Ambulatory Visit (INDEPENDENT_AMBULATORY_CARE_PROVIDER_SITE_OTHER): Payer: BC Managed Care – PPO | Admitting: Gynecology

## 2013-12-27 ENCOUNTER — Telehealth: Payer: Self-pay | Admitting: *Deleted

## 2013-12-27 VITALS — BP 120/76 | Ht 60.25 in | Wt 132.6 lb

## 2013-12-27 DIAGNOSIS — Z803 Family history of malignant neoplasm of breast: Secondary | ICD-10-CM

## 2013-12-27 DIAGNOSIS — Z01419 Encounter for gynecological examination (general) (routine) without abnormal findings: Secondary | ICD-10-CM

## 2013-12-27 DIAGNOSIS — Z1151 Encounter for screening for human papillomavirus (HPV): Secondary | ICD-10-CM | POA: Insufficient documentation

## 2013-12-27 LAB — CBC WITH DIFFERENTIAL/PLATELET
Basophils Absolute: 0 10*3/uL (ref 0.0–0.1)
Basophils Relative: 0 % (ref 0–1)
Eosinophils Absolute: 0.1 10*3/uL (ref 0.0–0.7)
Eosinophils Relative: 2 % (ref 0–5)
HCT: 36.9 % (ref 36.0–46.0)
Hemoglobin: 12.9 g/dL (ref 12.0–15.0)
Lymphocytes Relative: 19 % (ref 12–46)
Lymphs Abs: 1.3 10*3/uL (ref 0.7–4.0)
MCH: 31.4 pg (ref 26.0–34.0)
MCHC: 35 g/dL (ref 30.0–36.0)
MCV: 89.8 fL (ref 78.0–100.0)
Monocytes Absolute: 0.4 10*3/uL (ref 0.1–1.0)
Monocytes Relative: 6 % (ref 3–12)
Neutro Abs: 4.8 10*3/uL (ref 1.7–7.7)
Neutrophils Relative %: 73 % (ref 43–77)
Platelets: 248 10*3/uL (ref 150–400)
RBC: 4.11 MIL/uL (ref 3.87–5.11)
RDW: 13.8 % (ref 11.5–15.5)
WBC: 6.6 10*3/uL (ref 4.0–10.5)

## 2013-12-27 LAB — COMPREHENSIVE METABOLIC PANEL
ALT: 15 U/L (ref 0–35)
AST: 16 U/L (ref 0–37)
Albumin: 4.2 g/dL (ref 3.5–5.2)
Alkaline Phosphatase: 59 U/L (ref 39–117)
BUN: 8 mg/dL (ref 6–23)
CO2: 27 mEq/L (ref 19–32)
Calcium: 8.7 mg/dL (ref 8.4–10.5)
Chloride: 103 mEq/L (ref 96–112)
Creat: 0.49 mg/dL — ABNORMAL LOW (ref 0.50–1.10)
Glucose, Bld: 82 mg/dL (ref 70–99)
Potassium: 3.8 mEq/L (ref 3.5–5.3)
Sodium: 136 mEq/L (ref 135–145)
Total Bilirubin: 0.6 mg/dL (ref 0.2–1.2)
Total Protein: 6.5 g/dL (ref 6.0–8.3)

## 2013-12-27 LAB — TSH: TSH: 5.403 u[IU]/mL — ABNORMAL HIGH (ref 0.350–4.500)

## 2013-12-27 LAB — CHOLESTEROL, TOTAL: Cholesterol: 176 mg/dL (ref 0–200)

## 2013-12-27 NOTE — Telephone Encounter (Signed)
Referral faxed to [facility] they will contact patient to schedule.

## 2013-12-27 NOTE — Addendum Note (Signed)
Addended by: Alen Blew on: 12/27/2013 12:24 PM   Modules accepted: Orders

## 2013-12-27 NOTE — Patient Instructions (Addendum)
BRCA-1 y BRCA-2 (BRCA-1 and BRCA-2) Los genes BRCA-1 y BRCA-2 estn vinculados al cncer de mama y al cncer de ovario hereditarios. Cada ao, a aproximadamente 200000 mujeres se les diagnostica cncer de mama invasivo, y a alrededor de 23000, se les diagnostica cncer de ovario (segn la American Cancer Society [Sociedad Estadounidense del Cncer]). De estos tipos de cncer, entre el 5% y el 10% se deben a una mutacin en uno de los genes BRCA. Los hombres tambin pueden heredar un riesgo mayor de Horticulturist, commercial de mama, principalmente debido a una alteracin en el gen BRCA-2.  Las Engineer, manufacturing con [employer] en el gen BRCA-1 o BRCA-2 tienen un riesgo significativamente mayor de Best boy cncer de mama (riesgo de por vida de hasta el 80%), de ovario (riesgo de por vida de hasta el 40%), de mama bilateral y otros tipos de cncer. Las [employer] BRCA se heredan y traspasan de generacin a generacin. Vidor, se heredan del lado paterno.  Se [place] el ADN de los glbulos blancos para detectar las mutaciones en los genes BRCA. Si bien los productos gnicos (protenas) de los genes BRCA actan solo en los tejidos de la mama y de los ovarios, los genes se presentan en cada clula del organismo y Physiological scientist la sangre es la forma ms sencilla de acceder a Patent examiner. PREPARACIN PARA EL ESTUDIO El anlisis para Chief Operating Officer en los genes BRCA se hace con Truddie Coco de sangre obtenida con Ardelia Mems aguja en una vena del brazo. El anlisis no requiere una biopsia [place] del tejido Engineer, structural u ovrico.  HALLAZGOS NORMALES Ningn signo de mutaciones genticas. Los rangos para los resultados normales pueden variar entre diferentes laboratorios y hospitales. Consulte siempre con su mdico despus de Psychologist, counselling estudio para Armed forces logistics/support/administrative officer significado de los [place] y si los valores se consideran "dentro de los lmites normales". SIGNIFICADO DEL ESTUDIO  El mdico leer los resultados y Electrical engineer con  usted sobre la importancia y el significado de los [place], as como las opciones de tratamiento y la necesidad de Optometrist pruebas adicionales, si fuera necesario. OBTENCIN DE LOS RESULTADOS DE LAS PRUEBAS Es su responsabilidad retirar el resultado del [place]. Consulte en el laboratorio cundo y cmo podr [employer]. OTRAS COSAS A TENER EN CUENTA Los resultados de su anlisis pueden tener repercusiones para otros familiares. Cuando un familiar se Network engineer para Chief Operating Officer en el BRCA, suelen surgir dudas sobre si compartir o no esta informacin con el resto de la familia. Asesrese con un consejero gentico sobre cmo Careers information officer resultado a sus familiares.  No est de ms insistir en que consulte a un mdico informado sobre pruebas genticas antes y despus del anlisis.  Deben considerarse muchos puntos durante la preparacin para un [employer] gentico y al [employer], y un consejero gentico tiene la experiencia y los conocimientos que lo ayudarn a Licensed conveyancer.  Si el anlisis de BRCA confirma la mutacin, las opciones son aumentar la frecuencia de los controles (por [place], [place], anlisis de sangre de CA-125 o ecografa transvaginal); recibir medicamentos para reducir Catering manager (por ejemplo, anticonceptivos orales o tamoxifeno); o someterse a una ciruga para extraer las mamas o los ovarios. Existen muchas variables involucradas y es Designer, jewellery sus opciones con su mdico y su consejero gentico. Segn los informes de los estudios de investigacin, cada 1034mjeres sin mutaciones en los genes BRCA, entre 12 y 4Firefighterde mama antes de l[place] y e[place]3 y 4Surveyor, minerals  cncer de ovario antes de esa edad. El riesgo aumenta con la edad. El anlisis puede indicarlo un mdico, preferentemente uno capaz de ofrecer asesoramiento gentico. Cornelia Copa de [place] se enviar a un laboratorio especializado en anlisis de BRCA. Fish farm manager of Clinical Oncology (Sociedad Estadounidense de Oncologa [place]) y [place] ([place]) Psychologist, forensic a las mujeres que se realizan el anlisis para que participen en estudios de [place] a largo plazo para ayudar a Therapist, music informacin sobre la efectividad de los distintos controles y las opciones de Clinical research associate. Document Released: 08/14/2013 [facility] Patient Information 2014 [place], [place].   Autoexamen de [employer]  Teacher, English as a foreign language) El autoexamen de mamas puede detectar problemas de manera temprana, prevenir complicaciones mdicas significativas y posiblemente salvar su vida. Al hacerlo, podr familiarizarse con el aspecto y forma de sus [place], y observar cambios. Esto le permite descubrir cambios de manera precoz. Este autoexamen [employer] ofrece la tranquilidad de que sus senos estn en buen [place] de [place]. Una forma de aprender qu es normal para sus mamas y si sufren modificaciones es Field seismologist un autoexamen.   Si encuentra un bulto o algo que no estaba presente anteriormente, lo mejor es ponerse en contacto con su mdico inmediatamente. Otro hallazgo que debe ser evaluado por su mdico es la secrecin del pezn, especialmente si es con sangre; cambios en la piel o enrojecimiento; reas donde la piel parece estar tironeada (retrada) o nuevos bultos o protuberancias. El dolor en los senos es rara vez se asocia con el cncer (malignidad), pero tambin debe ser evaluado por un mdico.  CMO REALIZAR EL AUTOEXAMEN DE MAMAS  El mejor momento para examinar sus mamas es a los 5 a 7 das despus de finalizado el perodo menstrual. Durante la menstruacin, las mamas estn ms abultadas y puede haber ms dificultad para Pension scheme manager modificaciones. Si no menstra, ha llegado a la menopausia, o le han extirpado el tero (histerectomia), usted debe examinar sus senos a intervalos regulares, por ejemplo cada mes. Si est amamantando, examine sus senos  despus de alimentar al beb o despus de usar un extractor de [place]. Los implantes mamarios no disminuyen el riesgo de bultos o tumores, por lo que debe seguir realizando el autoexamen de [employer] se recomienda. Hable con su mdico acerca de cmo determinar la diferencia entre el implante y el tejido [place]. Adems, debe consultar cuanta presin debe hacer durante el examen. Con el tiempo se familiarizar con las variaciones de las mamas y se sentir ms cmoda para Restaurant manager, fast food. Para el autoexamen deber quitarse toda la ropa de la cintura para [place].  1.  Observe sus senos y pezones. Prese frente a un espejo en una habitacin con buena iluminacin. Con las [employer] caderas, presione las manos firmemente [place]. Busque diferencias en la forma, el contorno y el tamao de un pecho al otro (asimetras). [place] asimetras se incluyen arrugas, depresiones o protuberancias. Tambin, busque cambios en la piel, como reas enrojecidas o escamosas. Busque cambios en los pezones, como secreciones, hoyuelos, cambios en la posicin, o enrojecimiento. 2. [place] cuidadosamente sus senos. Es mucho mejor [employer] en la ducha o en la baera, [place] jabn o cuando est recostada sobre su espalda. Coloque el brazo (en el lado de la mama que se examina) por arriba de la cabeza. Use las yemas (no las puntas) de los tres dedos centrales de la mano opuesta para palpar. Comience en la zona de la axila,  haga crculos de  de pulgada (2 cm) y vaya superponindolos. Utilice 3 niveles diferentes de presin (ligero, medio y [place]) en cada crculo antes de pasar al siguiente. Se necesita una presin ligera para sentir los tejidos ms cercanos a la piel. La presin media ayudar a sentir el tejido [employer] un poco ms profundo, mientras que se necesita una presin firme para palpar el tejido que se encuentra cerca de las [place]. Continuar superponiendo crculos y vaya hacia abajo, hasta sentir las [place], por  debajo del [place]. Luego mueva un espacio del ancho de un dedo hacia el centro del cuerpo. Siga con los crculos del  de pulgada (2 cm) mientras va lentamente hacia la clavcula, cerca de la base del cuello. Contine con el examen hacia arriba y hacia abajo con las 3 intensidades de presin Librarian, academic a la mitad del pecho. Hgalo con cada seno cuidadosamente, buscando bultos o modificaciones. 3. Debe llevar un registro escrito con los cambios o los hallazgos normales que encuentre para cada seno. Si registra esta informacin, no tiene que depender slo de la memoria para Financial trader, la sensibilidad o la ubicacin de los [place]. [place] en qu momento se encuentra del ciclo menstrual, si usted todava est menstruando. El tejido [employer] puede tener algunos bultos o tejidos engrosados. Sin embargo, consulte a su mdico si usted Location manager.   SOLICITE ATENCIN MDICA SI:   Observa cambios en la forma, en el contorno o el tamao de las mamas o los pezones.   Hay modificaciones en la piel, como zonas enrojecidas o escamosas en las mamas o en los pezones.   Tiene una secrecin anormal en los pezones.   Siente un nuevo bulto o reas engrosadas de [place] anormal.  Document Released: 10/24/2005 Document Revised: 10/10/2012 [facility] Patient Information 2014 [place], [place].

## 2013-12-27 NOTE — Progress Notes (Addendum)
Lisa Townsend [birth year] [id number]   History:    38 y.o.  for annual gyn exam has not been seen in our office since 2008. Patient did state that she had been receiving her Pap sm[facility] and her last one was approximately 3 years ago. Patient denied any prior history of abnormal Pap smears. Patient has a Nexplanon  that was placed in June of 2012 which needs to be removed this year. Patient states that currently she is not sexually active.  Patient with strong family history of breast cancer as follows: Mother breast cancer at age 79 Grandmother breast cancer age 27 Patient had a normal mammogram at age 61.  Past medical history,surgical history, family history and social history were all reviewed and documented in the EPIC chart.  Gynecologic History No LMP recorded. Patient has had an implant. Contraception: Nexplanon Last Pap:  3 years ago. Results were: normal Last mammogram: Age 64. Results were: normal  Obstetric History OB History  Gravida Para Term Preterm AB SAB TAB Ectopic Multiple Living  0         0         ROS: A ROS was performed and pertinent positives and negatives are included in the history.  GENERAL: No fevers or chills. HEENT: No change in vision, no earache, sore throat or sinus congestion. NECK: No pain or stiffness. CARDIOVASCULAR: No chest pain or pressure. No palpitations. PULMONARY: No shortness of breath, cough or wheeze. GASTROINTESTINAL: No abdominal pain, nausea, vomiting or diarrhea, melena or bright red blood per rectum. GENITOURINARY: No urinary frequency, urgency, hesitancy or dysuria. MUSCULOSKELETAL: No joint or muscle pain, no back pain, no recent trauma. DERMATOLOGIC: No rash, no itching, no lesions. ENDOCRINE: No polyuria, polydipsia, no heat or cold intolerance. No recent change in weight. HEMATOLOGICAL: No anemia or easy bruising or bleeding. NEUROLOGIC: No headache, seizures, numbness, tingling or weakness. PSYCHIATRIC:  No depression, no loss of interest in normal activity or change in sleep pattern.     Exam: chaperone present  BP 120/76  Ht 5' 0.25" (1.53 m)  Wt 132 lb 9.6 oz (60.147 kg)  BMI 25.69 kg/m2  Body mass index is 25.69 kg/(m^2).  General appearance : Well developed well nourished female. No acute distress HEENT: Neck supple, trachea midline, no carotid bruits, no thyroidmegaly Lungs: Clear to auscultation, no rhonchi or wheezes, or rib retractions  Heart: Regular rate and rhythm, no murmurs or gallops Breast:Examined in sitting and supine position were symmetrical in appearance, no palpable masses or tenderness,  no skin retraction, no nipple inversion, no nipple discharge, no skin discoloration, no axillary or supraclavicular lymphadenopathy Abdomen: no palpable masses or tenderness, no rebound or guarding Extremities: no edema or skin discoloration or tenderness  Pelvic:  Bartholin, Urethra, Skene Glands: Within normal limits             Vagina: No gross lesions or discharge  Cervix: No gross lesions or discharge  Uterus  anteverted, normal size, shape and consistency, non-tender and mobile  Adnexa  Without masses or tenderness  Anus and perineum  normal   Rectovaginal  normal sphincter tone without palpated masses or tenderness             Hemoccult not indicated     Assessment/Plan:  38 y.o. female for annual exam with strong family history of breast cancer will be referred to the [facility] for genetic counseling for BRCA1 and BRCA2 gene mutation testing. Information was  provided in [place]. I have stressed on her the importance of monthly breast exam as well. Her Pap smear was done today in the following labs were ordered: CBC, comprehensive metabolic panel, fasting lipid profile, TSH and urinalysis. She was provided also with a requisition to schedule for a 3-dimensional mammogram.  Note: This dictation was prepared with  Dragon/digital dictation along  withSmart phrase technology. Any transcriptional errors that result from this process are unintentional.   Terrance Mass MD, 11:49 AM 12/27/2013

## 2013-12-27 NOTE — Telephone Encounter (Signed)
Message copied by Thamas Jaegers on Fri Dec 27, 2013 11:55 AM ------      Message from: Terrance Mass      Created: Fri Dec 27, 2013 11:37 AM       Please schedule appointment for genetic counseling at the [facility] from  [place]. Patients mother with breast Ca at age 39 and grandmother on mother side at 18.  ------

## 2013-12-28 LAB — URINALYSIS W MICROSCOPIC + REFLEX CULTURE
Bacteria, UA: NONE SEEN
Bilirubin Urine: NEGATIVE
Casts: NONE SEEN
Crystals: NONE SEEN
Glucose, UA: NEGATIVE mg/dL
Hgb urine dipstick: NEGATIVE
Ketones, ur: NEGATIVE mg/dL
Leukocytes, UA: NEGATIVE
Nitrite: NEGATIVE
Protein, ur: NEGATIVE mg/dL
Specific Gravity, Urine: 1.008 (ref 1.005–1.030)
Squamous Epithelial / LPF: NONE SEEN
Urobilinogen, UA: 0.2 mg/dL (ref 0.0–1.0)
pH: 6 (ref 5.0–8.0)

## 2013-12-30 ENCOUNTER — Other Ambulatory Visit: Payer: Self-pay | Admitting: Gynecology

## 2013-12-30 DIAGNOSIS — R7989 Other specified abnormal findings of blood chemistry: Secondary | ICD-10-CM

## 2013-12-31 ENCOUNTER — Telehealth: Payer: Self-pay | Admitting: Genetic Counselor

## 2013-12-31 ENCOUNTER — Other Ambulatory Visit: Payer: Self-pay

## 2013-12-31 ENCOUNTER — Other Ambulatory Visit: Payer: BC Managed Care – PPO

## 2013-12-31 DIAGNOSIS — Z803 Family history of malignant neoplasm of breast: Secondary | ICD-10-CM

## 2013-12-31 DIAGNOSIS — Z1231 Encounter for screening mammogram for malignant neoplasm of breast: Secondary | ICD-10-CM

## 2013-12-31 DIAGNOSIS — R7989 Other specified abnormal findings of blood chemistry: Secondary | ICD-10-CM

## 2013-12-31 LAB — THYROID PANEL WITH TSH
Free Thyroxine Index: 2.9 (ref 1.0–3.9)
T3 Uptake: 37.6 % — ABNORMAL HIGH (ref 22.5–37.0)
T4, Total: 7.8 ug/dL (ref 5.0–12.5)
TSH: 6.263 u[IU]/mL — ABNORMAL HIGH (ref 0.350–4.500)

## 2013-12-31 NOTE — Telephone Encounter (Signed)
S/W PATIENT AND GAVE GENETIC COUSELING APPT FOR 05/04 @ 10 W/KAREN POWELL.  REFERRING DR. Uvaldo Rising DX- GENETIC BRCA 1/2 TESTING WELCOME PACKET MAILED.

## 2014-01-03 NOTE — Telephone Encounter (Signed)
appt 03/10/14 @ 10:00 am

## 2014-01-06 ENCOUNTER — Ambulatory Visit (INDEPENDENT_AMBULATORY_CARE_PROVIDER_SITE_OTHER): Payer: BC Managed Care – PPO | Admitting: Gynecology

## 2014-01-06 ENCOUNTER — Encounter: Payer: Self-pay | Admitting: Gynecology

## 2014-01-06 VITALS — BP 120/76

## 2014-01-06 DIAGNOSIS — R5381 Other malaise: Secondary | ICD-10-CM

## 2014-01-06 DIAGNOSIS — B9689 Other specified bacterial agents as the cause of diseases classified elsewhere: Secondary | ICD-10-CM

## 2014-01-06 DIAGNOSIS — E039 Hypothyroidism, unspecified: Secondary | ICD-10-CM | POA: Insufficient documentation

## 2014-01-06 DIAGNOSIS — Z113 Encounter for screening for infections with a predominantly sexual mode of transmission: Secondary | ICD-10-CM

## 2014-01-06 DIAGNOSIS — N898 Other specified noninflammatory disorders of vagina: Secondary | ICD-10-CM

## 2014-01-06 DIAGNOSIS — R5383 Other fatigue: Secondary | ICD-10-CM

## 2014-01-06 DIAGNOSIS — A499 Bacterial infection, unspecified: Secondary | ICD-10-CM

## 2014-01-06 DIAGNOSIS — Z8349 Family history of other endocrine, nutritional and metabolic diseases: Secondary | ICD-10-CM

## 2014-01-06 DIAGNOSIS — N760 Acute vaginitis: Secondary | ICD-10-CM

## 2014-01-06 LAB — WET PREP FOR TRICH, YEAST, CLUE
Trich, Wet Prep: NONE SEEN
Yeast Wet Prep HPF POC: NONE SEEN

## 2014-01-06 LAB — HEPATITIS B SURFACE ANTIGEN: Hepatitis B Surface Ag: NEGATIVE

## 2014-01-06 LAB — HEPATITIS C ANTIBODY: HCV Ab: NEGATIVE

## 2014-01-06 LAB — HIV ANTIBODY (ROUTINE TESTING W REFLEX): HIV: NONREACTIVE

## 2014-01-06 MED ORDER — TINIDAZOLE 500 MG PO TABS: ORAL_TABLET | ORAL | Status: DC

## 2014-01-06 MED ORDER — LEVOTHYROXINE SODIUM 50 MCG PO TABS: 50.0000 ug | ORAL_TABLET | Freq: Every day | ORAL | Status: DC

## 2014-01-06 NOTE — Patient Instructions (Addendum)
Levothyroxine oral capsules Qu es este medicamento? La LEVOTIROXINA es una hormona tiroidea. Este medicamento puede [employer] Corporation sntomas de deficiencia tiroidea, tales como hablar con lentitud, falta de energa, aumento de [place], cada del cabello, piel seca y sensibilidad inusual al fro. Tambin sirve para tratar una enfermedad llamada bocio (la dilatacin de la glndula tiroides). Se utiliza tambin para tratar algunos tipos de cncer tiroideo junto con la [place] y otros medicamentos. Este medicamento puede ser utilizado para otros usos; si tiene alguna pregunta consulte con su proveedor de atencin mdica o con su farmacutico. MARCAS COMERCIALES DISPONIBLES: Tirosint Qu le debo informar a mi profesional de la salud antes de tomar este medicamento? Necesita saber si usted presenta alguno de los [employer] o situaciones: -angina -problemas de coagulacin  -diabetes -si est haciendo dieta o participa en un programa para bajar de peso -problemas de fertilidad -enfermedad cardiaca -altos niveles de la hormona tiroidea -problema de la glndula pituitaria -ataque cardaco previo -una reaccin alrgica o inusual a la levotiroxina, otras hormonas tiroideas, a otros medicamentos, alimentos, colorantes o conservantes -si est embarazada o buscando quedar embarazada -si est amamantando a un beb Cmo debo utilizar este medicamento? Tome este medicamento por va oral con un vaso de agua. Es mejor tomar este medicamento con el estmago vaco por lo menos 30 minutos hasta una hora antes de Merchandiser, retail. Evite tomar anticidos que contengan aluminio o magnesio, simeticona, secuestradores del cidos biliares, carbonato de calcio, sulfonato de sodio de poliestireno, sulfato de hierro y Child psychotherapist de 4 horas de Systems developer. No corte, triture ni mstique este medicamento. Siga las instrucciones de la etiqueta del [place]. Tmelo a la [employer] Technologies Corporation. No tome su  medicamento con una frecuencia mayor que la indicada. Hable con su pediatra para informarse acerca del uso de este medicamento en nios. Aunque este medicamento se puede recetar para condiciones selectivas, las precauciones se aplican. Ya que las cpsulas no se pueden triturar o Sports administrator, solo se Occupational hygienist a los nios que pueden tragar una cpsula intacta. Sobredosis: Pngase en contacto inmediatamente con un centro toxicolgico o una sala de urgencia si usted cree que haya tomado demasiado medicamento. ATENCIN: [employer] es solo para usted. No comparta este medicamento con nadie. Qu sucede si me olvido de una dosis? Si olvida una dosis, tmela lo antes posible. Si es casi la hora de la prxima dosis, tome slo esa dosis. No tome dosis adicionales o dobles. Qu puede interactuar con este medicamento? -amiodarona -anticidos -medicamentos antitiroideos -suplementos de calcio -carbamazepina -colestiramina -colestipol -digoxina -hormonas femeninas, incluyendo el contraconceptivo o las pldoras anticonceptivas -suplementos de hierro -Personal assistant -productos lquidos de nutricin, como Ensure -medicamentos para resfros y Primary school teacher -medicamentos para la diabetes -medicamentos para la depresin mental -medicamentos o productos a base de hierbas para bajar de peso o reducir el apetito -fenobarbital u otros barbitricos -fenitona -prednisona u otros corticosteroides -rifabutina -rifampicina -simeticona -sulfonato de sodio de poliestireno -isoflavonas de soya -sucralfato -teofilina -warfarina Puede ser que esta lista no menciona todas las posibles interacciones. Informe a su profesional de KB Home	[place] de [employer] Corporation productos a base de hierbas, medicamentos de [place] o suplementos nutritivos que est tomando. Si usted fuma, consume bebidas alcohlicas o si utiliza drogas ilegales, indqueselo tambin a su profesional de KB Home	[place]. Algunas sustancias pueden  interactuar con su medicamento. A qu debo estar atento al usar Coca-Cola? No cambie de marca de Coca-Cola a menos que su profesional de KB Home	[place]  est de acuerdo. Consulte con su profesional de la salud si no est seguro. Necesitar realizarse [employer] regularmente y [place] de sangre de [place] en cuando para evaluar su respuesta al [place]. Si est News Corporation para el hipotiroidismo, pueden transcurrir varias semanas antes de que observe Southwest Airlines. Consulte con su mdico o con su profesional de la salud si sus sntomas no mejoran. Tal vez se necesario usar este medicamento de por vida. No deje de usarlo a menos que su mdico o su profesional de KB Home	[place] as lo indique. Este medicamento puede afectar su nivel de Dispensing optician. Si tiene diabetes, controle su nivel de azcar como se le haya indicado. Es posible que se le caiga un poco el cabello al comenzar [place]. Por lo general este problema se resuelve solo. Si va a someterse a una operacin, informe a su mdico o a su profesional de la salud que est tomando Coca-Cola. Qu efectos secundarios puedo tener al Masco Corporation este medicamento? Efectos secundarios que debe informar a su mdico o a Barrister's clerk de la salud tan pronto como sea posible: -Chief of Staff como erupcin cutnea, picazn o urticarias, hinchazn de la cara, labios o lengua -dolor en el pecho -sudoracin excesiva o intolerancia al calor -pulso cardiaco rpido o irregular -nerviosismo -hinchazn de tobillos, pies o piernas -temblores  Efectos secundarios que, por lo general, no requieren atencin mdica (debe informarlos a su mdico o a su profesional de la salud si persisten o si son molestos): -cambios en el apetito -cambios en los perodos menstruales -diarrea -cada del cabello -dolor de cabeza -dificultad para conciliar el sueno -prdida de peso Puede ser que esta lista no menciona todos los posibles efectos  secundarios. Comunquese a su mdico por asesoramiento mdico Humana Inc. Usted puede informar los efectos secundarios a la FDA por telfono al 1-800-FDA-1088. Dnde debo guardar mi medicina? Mantngala fuera del alcance de los nios. Gurdela a FPL Group, entre 15 y 43 grados C (96 y 26 grados F). Protjala de la luz y de la humedad. Mantenga el envase bien cerrado. Deseche todo el medicamento que no haya utilizado, despus de la fecha de vencimiento. ATENCIN: Este folleto es un resumen. Puede ser que no cubra toda la posible informacin. Si usted tiene preguntas acerca de esta medicina, consulte con su mdico, su farmacutico o su profesional de Technical sales engineer.  2014, Elsevier/Gold Standard. (2009-02-04 11:22:55) Hipotiroidismo (Hypothyroidism) La tiroides es una glndula grande ubicada en la parte anterior e inferior del cuello. La glndula tiroides interviene en el control del metabolismo. El metabolismo es el modo en que el organismo utiliza los alimentos. El control del metabolismo se realiza a travs de una hormona denominada tiroxina. Cuando la actividad de esta glndula est por debajo de lo normal (hipotiroidismo) produce muy poca cantidad de hormona. CAUSAS Aqu se incluyen:   Ausencia de tejido tiroideo.  Bocio por dficit de yodo.  Bocio por medicamentos.  Defectos congnitos (desde el nacimiento).  Trastornos de la glndula pituitaria Esto ocasiona la falta de TSH (sigla que significa hormona estimulante de la tiroides) Esta hormona le informa a la tiroides que debe producir ms hormona. SNTOMAS  Letargia (sentir que no se tiene Teacher, early years/pre)  Intolerancia al fro  Sun[employer] (a pesar de una ingesta normal de alimentos)  Piel seca  Cabello seco  Irregularidades menstruales  Enlentecimiento de los procesos de pensamiento La insuficiente cantidad de hormona tiroidea tambin puede ocasionar problemas cardacos. El hipotiroidismo en el recin  nacido es el cretinismo en su forma extrema. Es importante que esta forma se trate de modo adecuado e inmediato, ya que puede conducir rpidamente al retardo del desarrollo fsico y mental. DIAGNSTICO Para comprobar la existencia de hipotiroidismo, Mining engineer anlisis de sangre y radiografas y estudios con [place]. Muchas veces los signos estn ocultos. Es necesario que el profesional vigile la enfermedad con anlisis de [place]. Esto se realiza luego de Electrical engineer diagnstico (determinar cul es el problema). Puede ser necesario que el profesional que lo asiste controle esta enfermedad con anlisis de sangre ya sea antes o despus del diagnstico y [place]. TRATAMIENTO Los niveles bajos de hormona tiroidea se incrementan con el uso de hormona tiroidea sinttica. Este es un tratamiento seguro y [place]. Se dispone de hormona tiroidea sinttica para el tratamiento de este trastorno. Generalmente lleva algunas semanas obtener el efecto total de los medicamentos. Luego de obtener el efecto completo del [place], habitualmente pasan otras cuatro semanas para que los sntomas empiezan a Armed forces operational officer. El profesional podr comenzar indicndole dosis bajas. Si usted tuvo problemas cardacos, la dosis se aumentar de manera gradual. Podr volver a lo normal sin Firefighter una situacin de emergencia. [place] los [employer] ha indicado el profesional que lo asiste. Infrmele al profesional todos los medicamentos que toma o que ha comenzado a Radio producer. El profesional que lo asiste lo ayudar con los esquemas de las dosis.  A medida que obtiene mejora, es necesario aumentar la dosis. Ser necesario Optometrist continuos anlisis de [place], segn lo indique el profesional.  Informe acerca de todos los efectos secundarios que sospeche que podran deberse a los medicamentos. SOLICITE ATENCIN MDICA SI: Solicite atencin mdica si  observa:  Sudoracin.  Temblores.  Ansiedad.  Rpida prdida de peso.  Intolerancia al calor.  Cambios emocionales.  Diarrea.  Debilidad. SOLICITE ATENCIN MDICA DE INMEDIATO SI: Presenta dolor en el pecho, una frecuencia cardaca irregular (palpitaciones) o latidos cardacos rpidos. EST SEGURO QUE:   Comprende las instrucciones para el alta mdica.  Controlar su enfermedad.  Solicitar atencin mdica de inmediato segn las indicaciones. Document Released: 10/24/2005 Document Revised: 01/16/2012 [facility] Patient Information 2014 [place], [place]. Vaginosis bacteriana (Bacterial Vaginosis) La vaginosis bacteriana es una infeccin vaginal que perturba el equilibrio normal de las bacterias que se encuentran en la vagina. Es el resultado de un crecimiento excesivo de ciertas bacterias. Esta es la infeccin vaginal ms frecuente en mujeres en edad reproductiva. El tratamiento es importante para prevenir complicaciones, especialmente en mujeres embarazadas, dado que puede causar un parto prematuro. CAUSAS  La vaginosis bacteriana se origina por un aumento de bacterias nocivas que, generalmente, estn presentes en cantidades ms pequeas en la vagina. Varios tipos diferentes de bacterias pueden causar esta afeccin. Sin embargo, la causa de su desarrollo no se comprende totalmente. Soldier o comportamientos pueden exponerlo a un mayor riesgo de desarrollar vaginosis bacteriana, entre los que se incluyen:  Tener una nueva pareja sexual o mltiples parejas sexuales.  Las duchas vaginales  El uso del DIU (dispositivo intrauterino) como mtodo anticonceptivo. El contagio no se produce en baos, por ropas de cama, en piscinas o por contacto con objetos. SIGNOS Y SNTOMAS  Algunas mujeres que padecen vaginosis bacteriana no presentan signos ni sntomas. Los sntomas ms comunes son:  Secrecin vaginal de color grisceo.  Secrecin vaginal con olor  similar al W[employer] International, especialmente despus de Retail banker.  Picazn o sensacin de ardor en  la vagina o la vulva.  Ardor o dolor al [employer] Airlines. DIAGNSTICO  Su mdico analizar su historia clnica y le examinar la vagina para detectar signos de vaginosis bacteriana. Puede tomarle Tr[place]ie [place] de flujo vaginal. Su mdico examinar esta muestra con un microscopio para controlar las bacterias y clulas anormales. Tambin puede realizarse un anlisis del pH vaginal.  TRATAMIENTO  La vaginosis bacteriana puede tratarse con antibiticos, en forma de comprimidos o de crema vaginal. Puede indicarse una segunda tanda de antibiticos si la afeccin se repite despus del tratamiento.  Bonita solo medicamentos de venta libre o recetados, segn las indicaciones del mdico.  Si le han recetado antibiticos, tmelos como se le indic. Asegrese de que finaliza la prescripcin completa aunque se sienta mejor.  No mantenga relaciones sexuales Animator.  Comunique a sus compaeros sexuales que sufre una infeccin vaginal. Deben consultar a su mdico y recibir tratamiento si tienen problemas, como picazn o una erupcin cutnea leve.  Practique el sexo seguro usando preservativos y tenga un nico compaero sexual. SOLICITE ATENCIN MDICA SI:   Sus sntomas no mejoran despus de 3 das de [place].  Aumenta la secrecin o Conservation officer, historic buildings.  Tiene fiebre. ASEGRESE DE QUE:   Comprende estas instrucciones.  Controlar su afeccin.  Recibir ayuda de inmediato si no mejora o si empeora. PARA OBTENER MS INFORMACIN  Centros para el control y la prevencin de Probation officer for Disease Control and Prevention, CDC): AppraiserFraud.fi Asociacin Estadounidense de la Salud Sexual (American Sexual Health Association, SHA): www.ashastd.org  Document Released: 01/31/2008 Document Revised: 08/14/2013 [facility] Patient Information  2014 [place], [place].

## 2014-01-06 NOTE — Progress Notes (Signed)
   Patient presented to the office today to discuss the results of her recent thyroid function tests that were ordered, for annual exam. It was repeated a week later because her TSH was continued to be found elevated and this is the reason for today's visit. Patient's mother had history of hypothyroidism as well. Patient's complain of tiredness and fatigue and lack of energy but states her weight has remained more or less the same. She has a Nexplanon which is due to be removed this coming May. She also is requesting this STD screen today. She was also complaining of fascia and vaginal discharge.  Her recent thyroid function tests were as follows: Results for Lisa Townsend (MRN [id number]) as of 01/06/2014 12:24  Ref. Range 12/27/2013 11:51 12/31/2013 09:22  TSH Latest Range: 0.350-4.500 uIU/mL 5.403 (H) 6.263 (H)  T4, Total Latest Range: 5.0-12.5 ug/dL  7.8  Free Thyroxine Index Latest Range: 1.0-3.9   2.9  T3 was 37.6  Exam: Bartholin urethra Skene was within normal limits Vagina: Fishy odor  gray-colored discharge Cervix: No lesions seen Bimanual exam: Not done Rectal exam: Not done  GC and Chlamydia culture obtained Wet prep: Pos Amine, many clue cells, few WBC, too numerous to count bacteria  Assessment 4/plan: #1 hypothyroidism. Patient be started on levothyroxine 50 mcg daily. Patient will return back to the office in one month for a followup TSH. Literature and information on thyroid disease and on levothyroxine was provided in [place]. #2 bacterial vaginosis will be treated with Tindamaz 500 mg 4 tablets today and to repeat in 24 hours #3 we discussed probiotic gel to use twice a week to prevent recurrent yeast or BV #4 STD screening consisting of GC and Chlamydia culture, HIV, RPR, hepatitis B and C. pending at time of this dictation. #5 patient will return back to the office in May to remove her Nexplanon which will expire.

## 2014-01-07 LAB — GC/CHLAMYDIA PROBE AMP
CT Probe RNA: NEGATIVE
GC Probe RNA: NEGATIVE

## 2014-01-07 LAB — RPR

## 2014-01-08 ENCOUNTER — Ambulatory Visit: Payer: BC Managed Care – PPO | Admitting: Gynecology

## 2014-01-14 ENCOUNTER — Ambulatory Visit
Admission: RE | Admit: 2014-01-14 | Discharge: 2014-01-14 | Disposition: A | Discharge status: Home / Self Care | Payer: BC Managed Care – PPO | Source: Ambulatory Visit

## 2014-01-14 DIAGNOSIS — Z803 Family history of malignant neoplasm of breast: Secondary | ICD-10-CM

## 2014-01-14 DIAGNOSIS — Z1231 Encounter for screening mammogram for malignant neoplasm of breast: Secondary | ICD-10-CM

## 2014-01-15 ENCOUNTER — Other Ambulatory Visit: Payer: Self-pay | Admitting: Gynecology

## 2014-01-15 DIAGNOSIS — R928 Other abnormal and inconclusive findings on diagnostic imaging of breast: Secondary | ICD-10-CM

## 2014-01-28 ENCOUNTER — Ambulatory Visit
Admission: RE | Admit: 2014-01-28 | Discharge: 2014-01-28 | Disposition: A | Discharge status: Home / Self Care | Payer: BC Managed Care – PPO | Source: Ambulatory Visit | Attending: Gynecology | Admitting: Gynecology

## 2014-01-28 ENCOUNTER — Other Ambulatory Visit: Payer: BC Managed Care – PPO

## 2014-01-28 ENCOUNTER — Other Ambulatory Visit: Payer: Self-pay | Admitting: Gynecology

## 2014-01-28 ENCOUNTER — Ambulatory Visit (HOSPITAL_BASED_OUTPATIENT_CLINIC_OR_DEPARTMENT_OTHER): Payer: BC Managed Care – PPO | Admitting: Genetic Counselor

## 2014-01-28 ENCOUNTER — Encounter: Payer: Self-pay | Admitting: Genetic Counselor

## 2014-01-28 DIAGNOSIS — Z8042 Family history of malignant neoplasm of prostate: Secondary | ICD-10-CM

## 2014-01-28 DIAGNOSIS — Z803 Family history of malignant neoplasm of breast: Secondary | ICD-10-CM

## 2014-01-28 DIAGNOSIS — IMO0002 Reserved for concepts with insufficient information to code with codable children: Secondary | ICD-10-CM

## 2014-01-28 DIAGNOSIS — R928 Other abnormal and inconclusive findings on diagnostic imaging of breast: Secondary | ICD-10-CM

## 2014-01-28 DIAGNOSIS — Z800 Family history of malignant neoplasm of digestive organs: Secondary | ICD-10-CM

## 2014-01-28 NOTE — Progress Notes (Signed)
Dr. Uvaldo Rising requested a consultation for genetic counseling and risk assessment for Lisa Townsend, a 38 y.o. female, for discussion of her family history of breast and stomach cancer.  She presents to clinic today to discuss the possibility of a genetic predisposition to cancer, and to further clarify her risks, as well as her family members' risks for cancer.   HISTORY OF PRESENT ILLNESS: Lisa Townsend is a 38 y.o. female with no personal history of cancer.  Her mother passed away last month from breast cancer.  She was a patient of Dr. Jana Hakim.  [name] has had one mammogram, and has never had a breast biopsy.  Past Medical History  Diagnosis Date  . Migraine   . Chronic constipation     Past Surgical History  Procedure Laterality Date  . Nose surgery      History   Social History  . Marital Status: Single    Spouse Name: N/A    Number of Children: 0  . Years of Education: N/A   Social History Main Topics  . Smoking status: Never Smoker   . Smokeless tobacco: None  . Alcohol Use: Yes     Comment: occasional  . Drug Use: No  . Sexual Activity: Yes   Other Topics Concern  . None   Social History Narrative  . None    REPRODUCTIVE HISTORY AND PERSONAL RISK ASSESSMENT FACTORS: Menarche was at age 22.   premenopausal Uterus Intact: yes Ovaries Intact: yes [id number], first live birth at age N/A  She has not previously undergone treatment for infertility.   Oral Contraceptive use: 1 years   She has not used HRT in the past.    FAMILY HISTORY:  We obtained a detailed, 4-generation family history.  Significant diagnoses are listed below: Family History  Problem Relation Age of Onset  . Urolithiasis Father   . Cancer Mother   . Hypertension Mother   . Breast cancer Mother 67  . Breast cancer Maternal Grandmother     dx under 66  . Stomach cancer Paternal Grandmother     dx in her 78s  . Alcohol abuse Maternal Uncle   . Prostate cancer Paternal Uncle     mid  44s    Patient's maternal ancestors are of [place] (originally from [place]) descent, and paternal ancestors are of [place] (originally from [place]) descent. There is no reported Ashkenazi Jewish ancestry. There is no known consanguinity.  GENETIC COUNSELING ASSESSMENT: Lisa Townsend is a 37 y.o. female with a family history of breast cancer which somewhat suggestive of a hereditary cancer syndrome and predisposition to cancer. We, therefore, discussed and recommended the following at today's visit.   DISCUSSION: We reviewed the characteristics, features and inheritance patterns of hereditary cancer syndromes. We also discussed genetic testing, including the appropriate family members to test, the process of testing, insurance coverage and turn-around-time for results. We reviewed hereditary cancer genes, and the common reasons why there would be a hereditary breast cancer running in the family.  PLAN: After considering the risks, benefits, and limitations, Lisa Townsend provided informed consent to pursue genetic testing and the blood sample will be sent to [facility] for analysis of the Breast/Ovarian Cancer Panel. We discussed the implications of a positive, negative and/ or variant of uncertain significance genetic test result. Results should be available within approximately 3 weeks' time, at which point they will be disclosed by telephone to Lisa Townsend, as will any additional recommendations warranted by these results. Lisa Townsend  will receive a summary of her genetic counseling visit and a copy of her results once available. This information will also be available in Epic. We encouraged Lisa Townsend to remain in contact with cancer genetics annually so that we can continuously update the family history and inform her of any changes in cancer genetics and testing that may be of benefit for her family. [name] Morris's questions were answered to her satisfaction today. Our contact information  was provided should additional questions or concerns arise.  The patient was seen for a total of 60 minutes, greater than 50% of which was spent face-to-face counseling.  This note will also be sent to the referring provider via the electronic medical record. The patient will be supplied with a summary of this genetic counseling discussion as well as educational information on the discussed hereditary cancer syndromes following the conclusion of their visit.   Patient was discussed with Dr. Marcy Panning.   _______________________________________________________________________ For Office Staff:  Number of people involved in session: 1 Was an Intern/ student involved with case: no

## 2014-01-30 ENCOUNTER — Other Ambulatory Visit: Payer: BC Managed Care – PPO

## 2014-01-30 DIAGNOSIS — R5383 Other fatigue: Secondary | ICD-10-CM

## 2014-01-30 DIAGNOSIS — Z8349 Family history of other endocrine, nutritional and metabolic diseases: Secondary | ICD-10-CM

## 2014-01-30 DIAGNOSIS — E039 Hypothyroidism, unspecified: Secondary | ICD-10-CM

## 2014-01-31 LAB — TSH: TSH: 2.084 u[IU]/mL (ref 0.350–4.500)

## 2014-02-03 ENCOUNTER — Ambulatory Visit
Admission: RE | Admit: 2014-02-03 | Discharge: 2014-02-03 | Disposition: A | Discharge status: Home / Self Care | Payer: BC Managed Care – PPO | Source: Ambulatory Visit | Attending: Gynecology | Admitting: Gynecology

## 2014-02-03 ENCOUNTER — Other Ambulatory Visit: Payer: Self-pay | Admitting: Gynecology

## 2014-02-03 DIAGNOSIS — R928 Other abnormal and inconclusive findings on diagnostic imaging of breast: Secondary | ICD-10-CM

## 2014-02-03 HISTORY — PX: BREAST BIOPSY: SHX20

## 2014-02-03 LAB — HM MAMMOGRAPHY

## 2014-02-21 ENCOUNTER — Encounter: Payer: Self-pay | Admitting: Genetic Counselor

## 2014-02-21 NOTE — Progress Notes (Signed)
HPI:  Lisa Townsend was previously seen in the [facility] due to a family history of cancer and concerns regarding a hereditary predisposition to cancer. Please refer to our prior cancer genetics clinic note for more information regarding Lisa Townsend's medical, social and family histories, and our assessment and recommendations, at the time. Lisa Townsend recent genetic test results were disclosed to her, as were recommendations warranted by these results. These results and recommendations are discussed in more detail below.  GENETIC TEST RESULTS: At the time of Lisa Townsend's visit, we recommended she pursue genetic testing of the Breast/Ovarian cancer gene panel. This test, which included sequencing and deletion/duplication analysis of the genes listed on the test report, was performed at [facility]. Genetic testing was normal, and did not reveal a pathogenic mutation in these genes. A complete list of all genes tested is located on the test report scanned into EPIC. 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 At this time, it is unknown if this variant is a normal finding in Lisa Townsend or is associated with risk. With time, we suspect the lab will reclassify this variant and when that happens, we will try to recontact Lisa Townsend to discuss the reclassification and it's significance, if any. At this time, we would not change her medical management based on this variant found.   We discussed with Lisa Townsend that since the current genetic testing is not perfect, it is possible there may be a gene mutation in one of these genes that current testing cannot detect, but that chance is small.  We also discussed, that it is possible that another gene that has not yet been discovered, or that we have not yet tested, is responsible for the cancer diagnoses in the family, and it is, therefore, important to remain in touch with  cancer genetics in the future so that we can continue to offer Lisa Townsend the most up to date genetic testing.   CANCER SCREENING RECOMMENDATIONS: This normal result is reassuring and indicates that Lisa Townsend does not likely have an increased risk of cancer due to a a mutation in one of these genes.  We, therefore, recommended  Lisa Townsend continue to follow the cancer screening guidelines provided by her primary healthcare providers.   RECOMMENDATIONS FOR FAMILY MEMBERS:  Women in this family might be at some increased risk of developing cancer, over the general population risk, simply due to the family history of cancer.  We recommended women in this family have a yearly mammogram beginning at age 54, an an annual clinical breast exam, and perform monthly breast self-exams. Women in this family should also have a gynecological exam as recommended by their primary provider. All family members should have a colonoscopy by age 91.  FOLLOW-UP: Lastly, we discussed with Lisa Townsend that cancer genetics is a rapidly advancing field and it is possible that new genetic tests will be appropriate for her and/or her family members in the future. We encouraged her to remain in contact with cancer genetics on an annual basis so we can update her personal and family histories and let her know of advances in cancer genetics that may benefit this family.   Our contact number was provided. Lisa Townsend questions were answered to her satisfaction, and she knows she is welcome to call us at anytime with additional questions or concerns. This patient was discussed with Dr. Toney Rakes who agrees with the above.  Catherine A. Fine, MS, CGC Certified Genetic Counseor phone: [phone] [email or web address]

## 2014-03-10 ENCOUNTER — Other Ambulatory Visit: Payer: Self-pay

## 2014-03-10 ENCOUNTER — Encounter: Payer: Self-pay | Admitting: Genetic Counselor

## 2014-03-25 ENCOUNTER — Ambulatory Visit: Payer: BC Managed Care – PPO | Admitting: Gynecology

## 2014-03-25 ENCOUNTER — Ambulatory Visit (INDEPENDENT_AMBULATORY_CARE_PROVIDER_SITE_OTHER): Payer: BC Managed Care – PPO | Admitting: Women's Health

## 2014-03-25 DIAGNOSIS — G43909 Migraine, unspecified, not intractable, without status migrainosus: Secondary | ICD-10-CM

## 2014-03-25 DIAGNOSIS — B9689 Other specified bacterial agents as the cause of diseases classified elsewhere: Secondary | ICD-10-CM

## 2014-03-25 DIAGNOSIS — Z113 Encounter for screening for infections with a predominantly sexual mode of transmission: Secondary | ICD-10-CM

## 2014-03-25 DIAGNOSIS — N760 Acute vaginitis: Secondary | ICD-10-CM

## 2014-03-25 DIAGNOSIS — L293 Anogenital pruritus, unspecified: Secondary | ICD-10-CM

## 2014-03-25 DIAGNOSIS — A499 Bacterial infection, unspecified: Secondary | ICD-10-CM

## 2014-03-25 DIAGNOSIS — N898 Other specified noninflammatory disorders of vagina: Secondary | ICD-10-CM

## 2014-03-25 LAB — HEPATITIS B SURFACE ANTIGEN: Hepatitis B Surface Ag: NEGATIVE

## 2014-03-25 LAB — WET PREP FOR TRICH, YEAST, CLUE: Trich, Wet Prep: NONE SEEN

## 2014-03-25 LAB — HEPATITIS C ANTIBODY: HCV Ab: NEGATIVE

## 2014-03-25 LAB — RPR

## 2014-03-25 MED ORDER — METRONIDAZOLE 500 MG PO TABS: 500.0000 mg | ORAL_TABLET | Freq: Two times a day (BID) | ORAL | Status: DC

## 2014-03-25 MED ORDER — FLUCONAZOLE 150 MG PO TABS: 150.0000 mg | ORAL_TABLET | Freq: Once | ORAL | Status: DC

## 2014-03-25 MED ORDER — SUMATRIPTAN SUCCINATE 50 MG PO TABS: ORAL_TABLET | ORAL | Status: DC

## 2014-03-25 NOTE — Patient Instructions (Signed)
Monilial Vaginitis  Vaginitis in a soreness, swelling and redness (inflammation) of the vagina and vulva. Monilial vaginitis is not a sexually transmitted infection.  CAUSES   Yeast vaginitis is caused by yeast (candida) that is normally found in your vagina. With a yeast infection, the candida has overgrown in number to a point that upsets the chemical balance.  SYMPTOMS   · White, thick vaginal discharge.  · Swelling, itching, redness and irritation of the vagina and possibly the lips of the vagina (vulva).  · Burning or painful urination.  · Painful intercourse.  DIAGNOSIS   Things that may contribute to monilial vaginitis are:  · Postmenopausal and virginal states.  · Pregnancy.  · Infections.  · Being tired, sick or stressed, especially if you had monilial vaginitis in the past.  · Diabetes. Good control will help lower the chance.  · Birth control pills.  · Tight fitting garments.  · Using bubble bath, feminine sprays, douches or deodorant tampons.  · Taking certain medications that kill germs (antibiotics).  · Sporadic recurrence can occur if you become ill.  TREATMENT   Your caregiver will give you medication.  · There are several kinds of anti monilial vaginal creams and suppositories specific for monilial vaginitis. For recurrent yeast infections, use a suppository or cream in the vagina 2 times a week, or as directed.  · Anti-monilial or steroid cream for the itching or irritation of the vulva may also be used. Get your caregiver's permission.  · Painting the vagina with methylene blue solution may help if the monilial cream does not work.  · Eating yogurt may help prevent monilial vaginitis.  HOME CARE INSTRUCTIONS   · Finish all medication as prescribed.  · Do not have sex until treatment is completed or after your caregiver tells you it is okay.  · Take warm sitz baths.  · Do not douche.  · Do not use tampons, especially scented ones.  · Wear cotton underwear.  · Avoid tight pants and panty  hose.  · Tell your sexual partner that you have a yeast infection. They should go to their caregiver if they have symptoms such as mild rash or itching.  · Your sexual partner should be treated as well if your infection is difficult to eliminate.  · Practice safer sex. Use condoms.  · Some vaginal medications cause latex condoms to fail. Vaginal medications that harm condoms are:  · Cleocin cream.  · Butoconazole (Femstat®).  · Terconazole (Terazol®) vaginal suppository.  · Miconazole (Monistat®) (may be purchased over the counter).  SEEK MEDICAL CARE IF:   · You have a temperature by mouth above 102° F (38.9° C).  · The infection is getting worse after 2 days of treatment.  · The infection is not getting better after 3 days of treatment.  · You develop blisters in or around your vagina.  · You develop vaginal bleeding, and it is not your menstrual period.  · You have pain when you urinate.  · You develop intestinal problems.  · You have pain with sexual intercourse.  Document Released: 08/03/2005 Document Revised: 01/16/2012 Document Reviewed: 04/17/2009  Exit[employer]® Patient Information ©2014 [employer].

## 2014-03-25 NOTE — Progress Notes (Signed)
Patient ID: Reita Cliche, female   DOB: [birth year], 38 y.o.   MRN: [id number] Presents with vaginal irritation with itching. Had tried over-the-counter Monistat with little relief. Denies urinary symptoms, abdominal pain or fever. Rare cycle on nexplanon, due to be removed next month. New partner.  Exam: Appears well. External genitalia erythematous, speculum exam scant discharge, no odor noted wet prep positive for yeast, clues, amines, and TNTC bacteria. GC/Chlamydia culture taken. Bimanual no CMT or adnexal fullness or tenderness.  Yeast vaginitis Bacteria vaginosis Contraception management  Plan: Diflucan 150 by mouth x1 dose, Flagyl 500 twice daily for 7 days, alcohol precautions reviewed. Instructed to call or return if no relief of  symptoms. Contraception options reviewed, would like to try birth control pills, history of migraines without aaura, has used Imitrex in the past with good relief. Would like refill. Imitrex 50 mg onset of headache, repeat if needed in 2 hours, name more than 2 tablets in 24 hours prescription given.

## 2014-03-26 LAB — GC/CHLAMYDIA PROBE AMP
CT Probe RNA: NEGATIVE
GC Probe RNA: NEGATIVE

## 2014-03-26 LAB — HIV ANTIBODY (ROUTINE TESTING W REFLEX): HIV 1&2 Ab, 4th Generation: NONREACTIVE

## 2014-03-27 ENCOUNTER — Telehealth: Payer: Self-pay | Admitting: *Deleted

## 2014-03-27 NOTE — Telephone Encounter (Signed)
Pt was prescribed Diflucan 150 by mouth x1 dose, Flagyl 500 twice daily for 7 days, on OV 03/25/14 pt said no relief at all still itching and burning. Please advise

## 2014-03-27 NOTE — Telephone Encounter (Signed)
Unable to leave message on cell

## 2014-03-28 ENCOUNTER — Telehealth: Payer: Self-pay

## 2014-03-28 NOTE — Telephone Encounter (Signed)
Imitrex 50 mg, may repeat in 2 hours if headaches persists, no more than 2 tablets in 24 hours.

## 2014-03-28 NOTE — Telephone Encounter (Signed)
Pharmacist is calling to verify directions on Imitrex you prescribed.  Your directions said "may repeat in two hours if headache persists or returns".   I think they need more complete directions. Please advise what to tell them.

## 2014-03-28 NOTE — Telephone Encounter (Signed)
Telephone call, reviewed on day 4 of  medication, instructed to finish out medication it still symptomatic next week to call the office.

## 2014-03-28 NOTE — Telephone Encounter (Signed)
Pharmacy advised "Take one tablet initially, then may repeat in two hours if headache persists. No more than 2 tabs in 24 hours."

## 2014-04-23 ENCOUNTER — Ambulatory Visit (INDEPENDENT_AMBULATORY_CARE_PROVIDER_SITE_OTHER): Payer: BC Managed Care – PPO | Admitting: Gynecology

## 2014-04-23 ENCOUNTER — Encounter: Payer: Self-pay | Admitting: Gynecology

## 2014-04-23 VITALS — BP 126/72

## 2014-04-23 DIAGNOSIS — E039 Hypothyroidism, unspecified: Secondary | ICD-10-CM

## 2014-04-23 DIAGNOSIS — N390 Urinary tract infection, site not specified: Secondary | ICD-10-CM

## 2014-04-23 DIAGNOSIS — Z309 Encounter for contraceptive management, unspecified: Secondary | ICD-10-CM

## 2014-04-23 DIAGNOSIS — B3731 Acute candidiasis of vulva and vagina: Secondary | ICD-10-CM

## 2014-04-23 DIAGNOSIS — R5381 Other malaise: Secondary | ICD-10-CM

## 2014-04-23 DIAGNOSIS — R5383 Other fatigue: Secondary | ICD-10-CM

## 2014-04-23 DIAGNOSIS — L293 Anogenital pruritus, unspecified: Secondary | ICD-10-CM

## 2014-04-23 DIAGNOSIS — B373 Candidiasis of vulva and vagina: Secondary | ICD-10-CM

## 2014-04-23 DIAGNOSIS — N898 Other specified noninflammatory disorders of vagina: Secondary | ICD-10-CM

## 2014-04-23 DIAGNOSIS — R300 Dysuria: Secondary | ICD-10-CM

## 2014-04-23 DIAGNOSIS — Z3046 Encounter for surveillance of implantable subdermal contraceptive: Secondary | ICD-10-CM

## 2014-04-23 LAB — URINALYSIS W MICROSCOPIC + REFLEX CULTURE
Bilirubin Urine: NEGATIVE
Casts: NONE SEEN
Crystals: NONE SEEN
Glucose, UA: NEGATIVE mg/dL
Ketones, ur: NEGATIVE mg/dL
Nitrite: POSITIVE — AB
Protein, ur: NEGATIVE mg/dL
Specific Gravity, Urine: 1.02 (ref 1.005–1.030)
Urobilinogen, UA: 0.2 mg/dL (ref 0.0–1.0)
pH: 6.5 (ref 5.0–8.0)

## 2014-04-23 LAB — HEMOGLOBIN A1C
Hgb A1c MFr Bld: 5.3 % (ref <5.7)
Mean Plasma Glucose: 105 mg/dL (ref <117)

## 2014-04-23 MED ORDER — NITROFURANTOIN MONOHYD MACRO 100 MG PO CAPS: 100.0000 mg | ORAL_CAPSULE | Freq: Two times a day (BID) | ORAL | Status: DC

## 2014-04-23 MED ORDER — LEVONORGESTREL-ETHINYL ESTRAD 0.1-20 MG-MCG PO TABS: 1.0000 | ORAL_TABLET | Freq: Every day | ORAL | Status: DC

## 2014-04-23 MED ORDER — FLUCONAZOLE 150 MG PO TABS: 150.0000 mg | ORAL_TABLET | Freq: Once | ORAL | Status: DC

## 2014-04-23 MED ORDER — PHENAZOPYRIDINE HCL 200 MG PO TABS: 200.0000 mg | ORAL_TABLET | Freq: Three times a day (TID) | ORAL | Status: DC | PRN

## 2014-04-23 NOTE — Progress Notes (Signed)
Patient presents to the office today to discuss several issues. Patient is here first of all to have her Nexplanon removed which was [facility] 3 years [facility]. Patient also would like to go on the oral contraceptive pill for better cycle control as well. Patient had been on oral contraceptive pills several years ago without no issues. The patient has family history of breast cancer as follows:  Mother breast cancer at age 39  Grandmother breast cancer age 72  Patient had a normal mammogram at age 75.  The patient recently was tested for BRCA1 and BRCA2 mutation was negative.  Patient also complaining today of dysuria and frequency. She has history of hypothyroidism and has been complaining of tiredness and sleeping for long periods of time. She is on Synthroid 50 mcg daily.                                                             Nexplanon procedure note (removal)  The patient presented to the office today requesting for removal of her Nexplanon that was placed in the year 2012 on her left arm.   On examination the nexplanon implant was palpated and the distal end  (end  closest to the elbow) was marked. The area was sterilized with Betadine solution. 1% lidocaine was used for local anesthesia and approximately 1 cc  was injected into the site that was marked where  the incision was to be made. The local anesthetic was injected under the implant in an effort to keep it  close to the skin surface. Slight pressure pushing downward was made at the proximal end  of the implant in an effort to stabilize it. A bulge appeared indicating the distal end of the implant. Starting at the distal tip of the implant, a small longitudinal incision of 2 mm was made towards the elbow. By gently pushing the implant toward the incision the tip became visible. Grasping the implant with a curved forcep facilitated in gently removing the implant. Full confirmation of the entire implant which is 4 cm  long was inspected and was intact and was shown to the patient and discarded. After removing the implant, the incision was closed with a Steri-Strip and applying and adhesive Kerlix bandage. Patient will be instructed to remove the pressure bandage in 24 hours and the Steri-Strips in 3-5 days.  Follow labs ordered today: Hemoglobin A1c, TSH, and vitamin D level.  Urinalysis today demonstrated 11-20 WBC, 3-6 RBC many bacteria and yeast were noted.  Assessment/plan: #1 removal of Nexplanon which was outdated. Patient started on Alesse 28 day oral contraceptive pill which she will start on her second day of her cycle meanwhile she will use barrier contraception. #2 for her urinary tract infection she will be started on Macrobid one by mouth twice a day for 7 days. Her bladder discomfort she was prescribed peridium 200 mg to take 1 by mouth 3 times a day for the next 2-3 days. #3 for yeast infection she was prescribed Diflucan 150 mg one by mouth. #4 hypothyroidism we'll check her TSH to the fact that she's been feeling tired and sleeping. We'll also check a hemoglobin A1c and vitamin D level.  Note: This dictation was prepared with  Dragon/digital dictation along withSmart phrase technology.  Any transcriptional errors that result from this process are unintentional.   

## 2014-04-23 NOTE — Patient Instructions (Signed)

## 2014-04-24 ENCOUNTER — Other Ambulatory Visit: Payer: Self-pay | Admitting: Gynecology

## 2014-04-24 DIAGNOSIS — E559 Vitamin D deficiency, unspecified: Secondary | ICD-10-CM

## 2014-04-24 LAB — VITAMIN D 25 HYDROXY (VIT D DEFICIENCY, FRACTURES): Vit D, 25-Hydroxy: 23 ng/mL — ABNORMAL LOW (ref 30–89)

## 2014-04-24 LAB — TSH: TSH: 4.46 u[IU]/mL (ref 0.350–4.500)

## 2014-04-24 MED ORDER — VITAMIN D (ERGOCALCIFEROL) 1.25 MG (50000 UNIT) PO CAPS: 50000.0000 [IU] | ORAL_CAPSULE | ORAL | Status: DC

## 2014-04-26 LAB — URINE CULTURE: Colony Count: >=100000

## 2014-04-28 ENCOUNTER — Other Ambulatory Visit: Payer: Self-pay | Admitting: Gynecology

## 2014-04-28 MED ORDER — SULFAMETHOXAZOLE-TMP DS 800-160 MG PO TABS: 1.0000 | ORAL_TABLET | Freq: Two times a day (BID) | ORAL | Status: DC

## 2014-07-04 ENCOUNTER — Other Ambulatory Visit: Payer: Self-pay | Admitting: Gynecology

## 2014-08-05 ENCOUNTER — Ambulatory Visit (INDEPENDENT_AMBULATORY_CARE_PROVIDER_SITE_OTHER): Payer: BC Managed Care – PPO | Admitting: Family Medicine

## 2014-08-05 ENCOUNTER — Ambulatory Visit (INDEPENDENT_AMBULATORY_CARE_PROVIDER_SITE_OTHER): Payer: BC Managed Care – PPO

## 2014-08-05 VITALS — BP 100/62 | HR 74 | Temp 98.4°F | Resp 18 | Ht 61.0 in | Wt 133.0 lb

## 2014-08-05 DIAGNOSIS — J029 Acute pharyngitis, unspecified: Secondary | ICD-10-CM

## 2014-08-05 DIAGNOSIS — R079 Chest pain, unspecified: Secondary | ICD-10-CM

## 2014-08-05 DIAGNOSIS — R5381 Other malaise: Secondary | ICD-10-CM

## 2014-08-05 DIAGNOSIS — R109 Unspecified abdominal pain: Secondary | ICD-10-CM

## 2014-08-05 DIAGNOSIS — R319 Hematuria, unspecified: Secondary | ICD-10-CM

## 2014-08-05 DIAGNOSIS — Z3009 Encounter for other general counseling and advice on contraception: Secondary | ICD-10-CM

## 2014-08-05 DIAGNOSIS — R5383 Other fatigue: Secondary | ICD-10-CM

## 2014-08-05 DIAGNOSIS — N390 Urinary tract infection, site not specified: Secondary | ICD-10-CM

## 2014-08-05 DIAGNOSIS — R300 Dysuria: Secondary | ICD-10-CM

## 2014-08-05 LAB — POCT URINALYSIS DIPSTICK
Bilirubin, UA: NEGATIVE
Glucose, UA: NEGATIVE
Ketones, UA: NEGATIVE
Nitrite, UA: NEGATIVE
Protein, UA: NEGATIVE
Spec Grav, UA: 1.015
Urobilinogen, UA: 0.2
pH, UA: 6

## 2014-08-05 LAB — POCT UA - MICROSCOPIC ONLY
Casts, Ur, LPF, POC: NEGATIVE
Crystals, Ur, HPF, POC: NEGATIVE
Mucus, UA: NEGATIVE
Yeast, UA: NEGATIVE

## 2014-08-05 LAB — POCT CBC
Granulocyte percent: 7.7 %G — AB (ref 37–80)
HCT, POC: 41.4 % (ref 37.7–47.9)
Hemoglobin: 13.4 g/dL (ref 12.2–16.2)
Lymph, poc: 1.5 (ref 0.6–3.4)
MCH, POC: 31.5 pg — AB (ref 27–31.2)
MCHC: 32.3 g/dL (ref 31.8–35.4)
MCV: 97.5 fL — AB (ref 80–97)
MID (cbc): 0.3 (ref 0–0.9)
MPV: 8.8 fL (ref 0–99.8)
POC Granulocyte: 81.5 — AB (ref 2–6.9)
POC LYMPH PERCENT: 15.4 %L (ref 10–50)
POC MID %: 3.1 %M (ref 0–12)
Platelet Count, POC: 254 10*3/uL (ref 142–424)
RBC: 4.25 M/uL (ref 4.04–5.48)
RDW, POC: 13.4 %
WBC: 9.5 10*3/uL (ref 4.6–10.2)

## 2014-08-05 LAB — COMPREHENSIVE METABOLIC PANEL
ALT: 14 U/L (ref 0–35)
AST: 13 U/L (ref 0–37)
Albumin: 4.7 g/dL (ref 3.5–5.2)
Alkaline Phosphatase: 52 U/L (ref 39–117)
BUN: 8 mg/dL (ref 6–23)
CO2: 24 mEq/L (ref 19–32)
Calcium: 9.2 mg/dL (ref 8.4–10.5)
Chloride: 101 mEq/L (ref 96–112)
Creat: 0.73 mg/dL (ref 0.50–1.10)
Glucose, Bld: 94 mg/dL (ref 70–99)
Potassium: 4.2 mEq/L (ref 3.5–5.3)
Sodium: 136 mEq/L (ref 135–145)
Total Bilirubin: 0.8 mg/dL (ref 0.2–1.2)
Total Protein: 7.2 g/dL (ref 6.0–8.3)

## 2014-08-05 LAB — POCT URINE PREGNANCY: Preg Test, Ur: NEGATIVE

## 2014-08-05 LAB — POCT RAPID STREP A (OFFICE): Rapid Strep A Screen: NEGATIVE

## 2014-08-05 MED ORDER — CIPROFLOXACIN HCL 250 MG PO TABS: 250.0000 mg | ORAL_TABLET | Freq: Two times a day (BID) | ORAL | Status: DC

## 2014-08-05 NOTE — Progress Notes (Signed)
Subjective:  This chart was scribed for Merri Ray, MD by Marti Sleigh, Medical Scribe. This patient was seen in Room 14 and the patient's care was started at 1:50 PM.     Patient ID: Lisa Townsend, female    DOB: February 06, 1976, 38 y.o.   MRN: [id number]  Sore Throat  Associated symptoms include abdominal pain and shortness of breath. Pertinent negatives include no coughing, diarrhea, headaches or vomiting.   HPI Comments: Lisa Townsend is a 38 y.o. female who presents to [facility] complaining of chest soreness and SOB that started two weeks ago. Pt reports symptoms improved three days after symptoms began, and worsened a day later. Pt has been taking mucinex, nyquil and dramamine for the last two weeks without relief. Pt finds some relief from chest pain with dramamine. Pt experiences heartburn when she takes mucinex. Pt has experienced symptoms before but only when she is stressed. Pt complains of mild sore throat, diaphoresis, fatigue, decreased appetite, myalgias, chills, dysuria, and frequency as associated symptoms. Pt denies increased stress this week, cough, diarrhea, vomiting suicidal ideation, depression, hematuria, urgency and homicidal ideation. Pt states that she had high levels of stress last week due to family problems, better this week. Pt states that she has not seen a counselor and does not think she needs to see a counselor. Pt does not have a history of antidepressants, or antianxiety medication. Pt takes birth control pills, and her LNMP was two weeks ago, not sexually active recently.  Pt has normal BM daily. Also notes some dysuria at times, and urinating more frequently - past few weeks. No hematuria.   Last UTI few months ago.   Patient Active Problem List   Diagnosis Date Noted  . Unspecified hypothyroidism 01/06/2014  . Family history of breast cancer 12/27/2013  . Nausea and vomiting 07/26/2012  . Abdominal pain 07/26/2012  . Hypokalemia 07/26/2012   Past Medical History   Diagnosis Date  . Migraine   . Chronic constipation    Past Surgical History  Procedure Laterality Date  . Nose surgery     Allergies  Allergen Reactions  . Motrin [Ibuprofen]     itching   Prior to Admission medications   Medication Sig Start Date End Date Taking? Authorizing Provider  cetirizine (ZYRTEC) 10 MG tablet Take 10 mg by mouth daily.   Yes Historical Provider, MD  levonorgestrel-ethinyl estradiol (AVIANE,ALESSE,LESSINA) 0.1-20 MG-MCG tablet Take 1 tablet by mouth daily. 04/23/14  Yes Terrance Mass, MD  levothyroxine (SYNTHROID, LEVOTHROID) 50 MCG tablet Take 1 tablet (50 mcg total) by mouth daily. 01/06/14  Yes Terrance Mass, MD  Vitamin D, Ergocalciferol, (DRISDOL) 50000 UNITS CAPS capsule Take 1 capsule (50,000 Units total) by mouth every 7 (seven) days. 04/24/14  Yes Terrance Mass, MD   History   Social History  . Marital Status: Divorced    Spouse Name: N/A    Number of Children: 0  . Years of Education: N/A   Occupational History  . Not on file.   Social History Main Topics  . Smoking status: Never Smoker   . Smokeless tobacco: Not on file  . Alcohol Use: Yes     Comment: occasional  . Drug Use: No  . Sexual Activity: Yes   Other Topics Concern  . Not on file   Social History Narrative  . No narrative on file      Review of Systems  Constitutional: Positive for fever, chills, diaphoresis, appetite change and fatigue. Negative for  unexpected weight change.  HENT: Positive for sore throat.   Respiratory: Positive for shortness of breath. Negative for cough and chest tightness.   Cardiovascular: Positive for chest pain (Muscular chest wall.). Negative for palpitations and leg swelling.  Gastrointestinal: Positive for nausea and abdominal pain. Negative for vomiting, diarrhea and blood in stool.  Genitourinary: Positive for dysuria and pelvic pain.  Neurological: Negative for dizziness, syncope, light-headedness and headaches.    Psychiatric/Behavioral: Negative for suicidal ideas. The patient is nervous/anxious.        Objective:   Physical Exam  Vitals reviewed. Constitutional: She is oriented to person, place, and time. She appears well-developed and well-nourished. No distress.  HENT:  Head: Normocephalic and atraumatic.  Right Ear: Hearing, tympanic membrane, external ear and ear canal normal.  Left Ear: Hearing, tympanic membrane, external ear and ear canal normal.  Nose: Nose normal.  Mouth/Throat: Oropharynx is clear and moist. No oropharyngeal exudate.  Eyes: Conjunctivae and EOM are normal. Pupils are equal, round, and reactive to light.  Cardiovascular: Normal rate, regular rhythm, normal heart sounds and intact distal pulses.   No murmur heard. Pulmonary/Chest: Effort normal and breath sounds normal. No respiratory distress. She has no wheezes. She has no rhonchi.  Abdominal: Soft. There is tenderness. There is no rebound and no guarding.  Slight epigastric RUQ, suprapubic, tenderness.  Neurological: She is alert and oriented to person, place, and time.  Skin: Skin is warm and dry. No rash noted.  Psychiatric: She has a normal mood and affect. Her behavior is normal.      Filed Vitals:   08/05/14 1316  BP: 100/62  Pulse: 74  Temp: 98.4 F (36.9 C)  TempSrc: Oral  Resp: 18  Height: 5\' 1"  (1.549 m)  Weight: 133 lb (60.328 kg)  SpO2: 100%   Results for orders placed in visit on 08/05/14  POCT RAPID STREP A (OFFICE)      Result Value Ref Range   Rapid Strep A Screen Negative  Negative  POCT CBC      Result Value Ref Range   WBC 9.5  4.6 - 10.2 K/uL   Lymph, poc 1.5  0.6 - 3.4   POC LYMPH PERCENT 15.4  10 - 50 %L   MID (cbc) 0.3  0 - 0.9   POC MID % 3.1  0 - 12 %M   POC Granulocyte 81.5 (*) 2 - 6.9   Granulocyte percent 7.7 (*) 37 - 80 %G   RBC 4.25  4.04 - 5.48 M/uL   Hemoglobin 13.4  12.2 - 16.2 g/dL   HCT, POC 41.4  37.7 - 47.9 %   MCV 97.5 (*) 80 - 97 fL   MCH, POC 31.5 (*)  27 - 31.2 pg   MCHC 32.3  31.8 - 35.4 g/dL   RDW, POC 13.4     Platelet Count, POC 254  142 - 424 K/uL   MPV 8.8  0 - 99.8 fL  POCT URINE PREGNANCY      Result Value Ref Range   Preg Test, Ur Negative    POCT UA - MICROSCOPIC ONLY      Result Value Ref Range   WBC, Ur, HPF, POC 17-20     RBC, urine, microscopic 2-3     Bacteria, U Microscopic 3+     Mucus, UA neg     Epithelial cells, urine per micros 2-10     Crystals, Ur, HPF, POC neg     Casts, Ur, LPF, POC  neg     Yeast, UA neg     Renal tubular cells      POCT URINALYSIS DIPSTICK      Result Value Ref Range   Color, UA yellow     Clarity, UA cloudy     Glucose, UA neg     Bilirubin, UA neg     Ketones, UA neg     Spec Grav, UA 1.015     Blood, UA trace-lysed     pH, UA 6.0     Protein, UA neg     Urobilinogen, UA 0.2     Nitrite, UA neg     Leukocytes, UA moderate (2+)     EKG: SR, no acute findings.  [facility] reading (PRIMARY) by  Dr. Carlota Raspberry: CXR: NAD.    Assessment & Plan:    Lisa Townsend is a 38 y.o. female Dysuria , urinary tract infection with hematuria, site unspecified - Plan: Urine culture, ciprofloxacin (CIPRO) 250 MG tablet  -5 days of Cipro as prior cx intermediate sensitivity to Macrobid. Sx care, fluids rtc precautions. Urine cx pending.  Chest pain, unspecified - Plan: DG Chest 2 View, EKG 12-Lead  -reassuring EKG, CXR, no known cardiac RF's, or calf pain/swelling prior. Has been on OCP's, so will have staff call and offer for blood draw for D Dimer (as not discussed prior to her leaving office), but less likely PE with normal HR, O2sat, CXR and EKG.  Recheck in next 5 days if not improving.   Other malaise and fatigue - Plan: Culture, Group A Strep, Comprehensive metabolic panel  -may be related to UTI. If not improved with abx, rtc to discuss further. Labs pending.   Sore throat - Plan: POCT rapid strep A, Culture, Group A Strep  -?viral syndrome. Throat cx pending. Clearing fluids without  difficulty.   Abdominal pain, unspecified site - Plan: POCT CBC, Comprehensive metabolic panel  - possibly d/t UTI. Check CMP, but if persists/worsens - RTC.  Other general counseling and advice for contraceptive management  -she plans to d/c OCP's for awhile as not sexually active and feels like some o her current sx's are related to these medicines, btu cautioned on pregnancy risk, even oince she decides to restart. CAn rtc to discuss options further.   Meds ordered this encounter  Medications  . ciprofloxacin (CIPRO) 250 MG tablet    Sig: Take 1 tablet (250 mg total) by mouth 2 (two) times daily.    Dispense:  10 tablet    Refill:  0   Patient Instructions  Start antibiotic. You should receive a call or letter about your lab results within the next week to 10 days. If chest pain or other symptoms not improving in next 5 days, or any worsening sooner - return for recheck here or in emergency room.  If you are not sexually active and choose to stop contraception, remember you will need to be back on these medicines for at least 2-4 weeks for contraception to be effective again.   Return to the clinic or go to the nearest emergency room if any of your symptoms worsen or new symptoms occur.  Urinary Tract Infection Urinary tract infections (UTIs) can develop anywhere along your urinary tract. Your urinary tract is your body's drainage system for removing wastes and extra water. Your urinary tract includes two kidneys, two ureters, a bladder, and a urethra. Your kidneys are a pair of bean-shaped organs. Each kidney is about the size of your fist.  They are located below your ribs, one on each side of your spine. CAUSES Infections are caused by microbes, which are microscopic organisms, including fungi, viruses, and bacteria. These organisms are so small that they can only be seen through a microscope. Bacteria are the microbes that most commonly cause UTIs. SYMPTOMS  Symptoms of UTIs may vary  by age and gender of the patient and by the location of the infection. Symptoms in young women typically include a frequent and intense urge to urinate and a painful, burning feeling in the bladder or urethra during urination. Older women and men are more likely to be tired, shaky, and weak and have muscle aches and abdominal pain. A fever may mean the infection is in your kidneys. Other symptoms of a kidney infection include pain in your back or sides below the ribs, nausea, and vomiting. DIAGNOSIS To diagnose a UTI, your caregiver will ask you about your symptoms. Your caregiver also will ask to provide a urine sample. The urine sample will be tested for bacteria and white blood cells. White blood cells are made by your body to help fight infection. TREATMENT  Typically, UTIs can be treated with medication. Because most UTIs are caused by a bacterial infection, they usually can be treated with the use of antibiotics. The choice of antibiotic and length of treatment depend on your symptoms and the type of bacteria causing your infection. HOME CARE INSTRUCTIONS  If you were prescribed antibiotics, take them exactly as your caregiver instructs you. Finish the medication even if you feel better after you have only taken some of the medication.  Drink enough water and fluids to keep your urine clear or pale yellow.  Avoid caffeine, tea, and carbonated beverages. They tend to irritate your bladder.  Empty your bladder often. Avoid holding urine for long periods of time.  Empty your bladder before and after sexual intercourse.  After a bowel movement, women should cleanse from front to back. Use each tissue only once. SEEK MEDICAL CARE IF:   You have back pain.  You develop a fever.  Your symptoms do not begin to resolve within 3 days. SEEK IMMEDIATE MEDICAL CARE IF:   You have severe back pain or lower abdominal pain.  You develop chills.  You have nausea or vomiting.  You have continued  burning or discomfort with urination. MAKE SURE YOU:   Understand these instructions.  Will watch your condition.  Will get help right away if you are not doing well or get worse. Document Released: 08/03/2005 Document Revised: 04/24/2012 Document Reviewed: 12/02/2011 [facility] Patient Information 2015 [place], [place]. This information is not intended to replace advice given to you by your health care provider. Make sure you discuss any questions you have with your health care provider.   Chest Pain (Nonspecific) It is often hard to give a specific diagnosis for the cause of chest pain. There is always a chance that your pain could be related to something serious, such as a heart attack or a blood clot in the lungs. You need to follow up with your health care provider for further evaluation. CAUSES   Heartburn.  Pneumonia or bronchitis.  Anxiety or stress.  Inflammation around your heart (pericarditis) or lung (pleuritis or pleurisy).  A blood clot in the lung.  A collapsed lung (pneumothorax). It can develop suddenly on its own (spontaneous pneumothorax) or from trauma to the chest.  Shingles infection (herpes zoster virus). The chest wall is composed of bones, muscles, and  cartilage. Any of these can be the source of the pain.  The bones can be bruised by injury.  The muscles or cartilage can be strained by coughing or overwork.  The cartilage can be affected by inflammation and become sore (costochondritis). DIAGNOSIS  Lab tests or other studies may be needed to find the cause of your pain. Your health care provider may have you take a test called an ambulatory electrocardiogram (ECG). An ECG records your heartbeat patterns over a 24-hour period. You may also have other tests, such as:  Transthoracic echocardiogram (TTE). During echocardiography, sound waves are used to evaluate how blood flows through your heart.  Transesophageal echocardiogram (TEE).  Cardiac  monitoring. This allows your health care provider to monitor your heart rate and rhythm in real time.  Holter monitor. This is a portable device that records your heartbeat and can help diagnose heart arrhythmias. It allows your health care provider to track your heart activity for several days, if needed.  Stress tests by exercise or by giving medicine that makes the heart beat faster. TREATMENT   Treatment depends on what may be causing your chest pain. Treatment may include:  Acid blockers for heartburn.  Anti-inflammatory medicine.  Pain medicine for inflammatory conditions.  Antibiotics if an infection is present.  You may be advised to change lifestyle habits. This includes stopping smoking and avoiding alcohol, caffeine, and chocolate.  You may be advised to keep your head raised (elevated) when sleeping. This reduces the chance of acid going backward from your stomach into your esophagus. Most of the time, nonspecific chest pain will improve within 2-3 days with rest and mild pain medicine.  HOME CARE INSTRUCTIONS   If antibiotics were prescribed, take them as directed. Finish them even if you start to feel better.  For the next few days, avoid physical activities that bring on chest pain. Continue physical activities as directed.  Do not use any tobacco products, including cigarettes, chewing tobacco, or electronic cigarettes.  Avoid drinking alcohol.  Only take medicine as directed by your health care provider.  Follow your health care provider's suggestions for further testing if your chest pain does not go away.  Keep any follow-up appointments you made. If you do not go to an appointment, you could develop lasting (chronic) problems with pain. If there is any problem keeping an appointment, call to reschedule. SEEK MEDICAL CARE IF:   Your chest pain does not go away, even after treatment.  You have a rash with blisters on your chest.  You have a fever. SEEK  IMMEDIATE MEDICAL CARE IF:   You have increased chest pain or pain that spreads to your arm, neck, jaw, back, or abdomen.  You have shortness of breath.  You have an increasing cough, or you cough up blood.  You have severe back or abdominal pain.  You feel nauseous or vomit.  You have severe weakness.  You faint.  You have chills. This is an emergency. Do not wait to see if the pain will go away. Get medical help at once. Call your local emergency services (911 in [place] Do not drive yourself to the hospital. MAKE SURE YOU:   Understand these instructions.  Will watch your condition.  Will get help right away if you are not doing well or get worse. Document Released: 08/03/2005 Document Revised: 10/29/2013 Document Reviewed: 05/29/2008 [facility] Patient Information 2015 [place], [place]. This information is not intended to replace advice given to you by your health care  provider. Make sure you discuss any questions you have with your health care provider.     I personally performed the services described in this documentation, which was scribed in my presence. The recorded information has been reviewed and considered, and addended by me as needed.

## 2014-08-05 NOTE — Patient Instructions (Signed)
Start antibiotic. You should receive a call or letter about your lab results within the next week to 10 days. If chest pain or other symptoms not improving in next 5 days, or any worsening sooner - return for recheck here or in emergency room.  If you are not sexually active and choose to stop contraception, remember you will need to be back on these medicines for at least 2-4 weeks for contraception to be effective again.   Return to the clinic or go to the nearest emergency room if any of your symptoms worsen or new symptoms occur.  Urinary Tract Infection Urinary tract infections (UTIs) can develop anywhere along your urinary tract. Your urinary tract is your body's drainage system for removing wastes and extra water. Your urinary tract includes two kidneys, two ureters, a bladder, and a urethra. Your kidneys are a pair of bean-shaped organs. Each kidney is about the size of your fist. They are located below your ribs, one on each side of your spine. CAUSES Infections are caused by microbes, which are microscopic organisms, including fungi, viruses, and bacteria. These organisms are so small that they can only be seen through a microscope. Bacteria are the microbes that most commonly cause UTIs. SYMPTOMS  Symptoms of UTIs may vary by age and gender of the patient and by the location of the infection. Symptoms in young women typically include a frequent and intense urge to urinate and a painful, burning feeling in the bladder or urethra during urination. Older women and men are more likely to be tired, shaky, and weak and have muscle aches and abdominal pain. A fever may mean the infection is in your kidneys. Other symptoms of a kidney infection include pain in your back or sides below the ribs, nausea, and vomiting. DIAGNOSIS To diagnose a UTI, your caregiver will ask you about your symptoms. Your caregiver also will ask to provide a urine sample. The urine sample will be tested for bacteria and white  blood cells. White blood cells are made by your body to help fight infection. TREATMENT  Typically, UTIs can be treated with medication. Because most UTIs are caused by a bacterial infection, they usually can be treated with the use of antibiotics. The choice of antibiotic and length of treatment depend on your symptoms and the type of bacteria causing your infection. HOME CARE INSTRUCTIONS  If you were prescribed antibiotics, take them exactly as your caregiver instructs you. Finish the medication even if you feel better after you have only taken some of the medication.  Drink enough water and fluids to keep your urine clear or pale yellow.  Avoid caffeine, tea, and carbonated beverages. They tend to irritate your bladder.  Empty your bladder often. Avoid holding urine for long periods of time.  Empty your bladder before and after sexual intercourse.  After a bowel movement, women should cleanse from front to back. Use each tissue only once. SEEK MEDICAL CARE IF:   You have back pain.  You develop a fever.  Your symptoms do not begin to resolve within 3 days. SEEK IMMEDIATE MEDICAL CARE IF:   You have severe back pain or lower abdominal pain.  You develop chills.  You have nausea or vomiting.  You have continued burning or discomfort with urination. MAKE SURE YOU:   Understand these instructions.  Will watch your condition.  Will get help right away if you are not doing well or get worse. Document Released: 08/03/2005 Document Revised: 04/24/2012 Document Reviewed: 12/02/2011  [employer] Patient Information 2015 [place]. This information is not intended to replace advice given to you by your health care provider. Make sure you discuss any questions you have with your health care provider.   Chest Pain (Nonspecific) It is often hard to give a specific diagnosis for the cause of chest pain. There is always a chance that your pain could be related to something serious,  such as a heart attack or a blood clot in the lungs. You need to follow up with your health care provider for further evaluation. CAUSES   Heartburn.  Pneumonia or bronchitis.  Anxiety or stress.  Inflammation around your heart (pericarditis) or lung (pleuritis or pleurisy).  A blood clot in the lung.  A collapsed lung (pneumothorax). It can develop suddenly on its own (spontaneous pneumothorax) or from trauma to the chest.  Shingles infection (herpes zoster virus). The chest wall is composed of bones, muscles, and cartilage. Any of these can be the source of the pain.  The bones can be bruised by injury.  The muscles or cartilage can be strained by coughing or overwork.  The cartilage can be affected by inflammation and become sore (costochondritis). DIAGNOSIS  Lab tests or other studies may be needed to find the cause of your pain. Your health care provider may have you take a test called an ambulatory electrocardiogram (ECG). An ECG records your heartbeat patterns over a 24-hour period. You may also have other tests, such as:  Transthoracic echocardiogram (TTE). During echocardiography, sound waves are used to evaluate how blood flows through your heart.  Transesophageal echocardiogram (TEE).  Cardiac monitoring. This allows your health care provider to monitor your heart rate and rhythm in real time.  Holter monitor. This is a portable device that records your heartbeat and can help diagnose heart arrhythmias. It allows your health care provider to track your heart activity for several days, if needed.  Stress tests by exercise or by giving medicine that makes the heart beat faster. TREATMENT   Treatment depends on what may be causing your chest pain. Treatment may include:  Acid blockers for heartburn.  Anti-inflammatory medicine.  Pain medicine for inflammatory conditions.  Antibiotics if an infection is present.  You may be advised to change lifestyle habits.  This includes stopping smoking and avoiding alcohol, caffeine, and chocolate.  You may be advised to keep your head raised (elevated) when sleeping. This reduces the chance of acid going backward from your stomach into your esophagus. Most of the time, nonspecific chest pain will improve within 2-3 days with rest and mild pain medicine.  HOME CARE INSTRUCTIONS   If antibiotics were prescribed, take them as directed. Finish them even if you start to feel better.  For the next few days, avoid physical activities that bring on chest pain. Continue physical activities as directed.  Do not use any tobacco products, including cigarettes, chewing tobacco, or electronic cigarettes.  Avoid drinking alcohol.  Only take medicine as directed by your health care provider.  Follow your health care provider's suggestions for further testing if your chest pain does not go away.  Keep any follow-up appointments you made. If you do not go to an appointment, you could develop lasting (chronic) problems with pain. If there is any problem keeping an appointment, call to reschedule. SEEK MEDICAL CARE IF:   Your chest pain does not go away, even after treatment.  You have a rash with blisters on your chest.  You have a fever. SEEK  IMMEDIATE MEDICAL CARE IF:   You have increased chest pain or pain that spreads to your arm, neck, jaw, back, or abdomen.  You have shortness of breath.  You have an increasing cough, or you cough up blood.  You have severe back or abdominal pain.  You feel nauseous or vomit.  You have severe weakness.  You faint.  You have chills. This is an emergency. Do not wait to see if the pain will go away. Get medical help at once. Call your local emergency services (911 in [place] Do not drive yourself to the hospital. MAKE SURE YOU:   Understand these instructions.  Will watch your condition.  Will get help right away if you are not doing well or get worse. Document  Released: 08/03/2005 Document Revised: 10/29/2013 Document Reviewed: 05/29/2008 [facility] Patient Information 2015 [place], [place]. This information is not intended to replace advice given to you by your health care provider. Make sure you discuss any questions you have with your health care provider.

## 2014-08-06 ENCOUNTER — Telehealth: Payer: Self-pay | Admitting: *Deleted

## 2014-08-06 NOTE — Telephone Encounter (Signed)
Message copied by Belva Chimes on Wed Aug 06, 2014  9:37 AM ------      Message from: Carlota Raspberry, [place] R      Created: Tue Aug 05, 2014 10:54 PM       Please call patient.       I saw her Tuesday afternoon for a number of symptoms, but did complain of chest pain. Did not send out a ddimer to rule out PE as did not sound like this was case in office, but as she has been on OCP's, recommend we draw this, then if elevated  - may need chest CT.       Can have drawn as lab only order - DDimer., then if elevated - CT chest if she is ok with this plan.        ------

## 2014-08-06 NOTE — Telephone Encounter (Signed)
Tried to reach pt on cell phone- VM is not set up. Will try again later.

## 2014-08-07 LAB — URINE CULTURE: Colony Count: >=100000

## 2014-08-07 LAB — CULTURE, GROUP A STREP: Organism ID, Bacteria: NORMAL

## 2014-08-15 ENCOUNTER — Encounter: Payer: Self-pay | Admitting: Radiology

## 2014-10-03 ENCOUNTER — Other Ambulatory Visit: Payer: Self-pay | Admitting: Women's Health

## 2014-10-13 ENCOUNTER — Ambulatory Visit (INDEPENDENT_AMBULATORY_CARE_PROVIDER_SITE_OTHER): Payer: BC Managed Care – PPO | Admitting: Anesthesiology

## 2014-10-13 DIAGNOSIS — Z23 Encounter for immunization: Secondary | ICD-10-CM

## 2014-10-27 ENCOUNTER — Other Ambulatory Visit: Payer: Self-pay | Admitting: Gynecology

## 2014-10-29 ENCOUNTER — Ambulatory Visit (INDEPENDENT_AMBULATORY_CARE_PROVIDER_SITE_OTHER): Payer: BC Managed Care – PPO | Admitting: Gynecology

## 2014-10-29 ENCOUNTER — Encounter: Payer: Self-pay | Admitting: Gynecology

## 2014-10-29 DIAGNOSIS — N3000 Acute cystitis without hematuria: Secondary | ICD-10-CM

## 2014-10-29 DIAGNOSIS — B3731 Acute candidiasis of vulva and vagina: Secondary | ICD-10-CM

## 2014-10-29 DIAGNOSIS — R300 Dysuria: Secondary | ICD-10-CM

## 2014-10-29 DIAGNOSIS — B373 Candidiasis of vulva and vagina: Secondary | ICD-10-CM

## 2014-10-29 DIAGNOSIS — N898 Other specified noninflammatory disorders of vagina: Secondary | ICD-10-CM

## 2014-10-29 LAB — URINALYSIS W MICROSCOPIC + REFLEX CULTURE
Bilirubin Urine: NEGATIVE
Casts: NONE SEEN
Crystals: NONE SEEN
Glucose, UA: NEGATIVE mg/dL
Ketones, ur: NEGATIVE mg/dL
Nitrite: NEGATIVE
Protein, ur: 30 mg/dL — AB
Specific Gravity, Urine: 1.015 (ref 1.005–1.030)
Urobilinogen, UA: 0.2 mg/dL (ref 0.0–1.0)
pH: 6.5 (ref 5.0–8.0)

## 2014-10-29 LAB — WET PREP FOR TRICH, YEAST, CLUE
Clue Cells Wet Prep HPF POC: NONE SEEN
Trich, Wet Prep: NONE SEEN

## 2014-10-29 MED ORDER — FLUCONAZOLE 200 MG PO TABS: 200.0000 mg | ORAL_TABLET | Freq: Every day | ORAL | Status: DC

## 2014-10-29 MED ORDER — SULFAMETHOXAZOLE-TRIMETHOPRIM 800-160 MG PO TABS: 1.0000 | ORAL_TABLET | Freq: Two times a day (BID) | ORAL | Status: DC

## 2014-10-29 NOTE — Addendum Note (Signed)
Addended by: Nelva Nay on: 10/29/2014 11:34 AM   Modules accepted: Orders, SmartSet

## 2014-10-29 NOTE — Progress Notes (Signed)
Lisa Townsend 08/28/1976 [id number]        38 y.o.  G0P0 patient presents complaining of vaginal irritation and dysuria over the last several days. She took one Diflucan pill that she had at home but her symptoms have persisted. No real discharge but primarily dysuria and vulvar/vaginal irritation. No fever or chills, low back pain, abdominal pain, nausea, vomiting, diarrhea, constipation.  Past medical history,surgical history, problem list, medications, allergies, family history and social history were all reviewed and documented in the EPIC chart.  Directed ROS with pertinent positives and negatives documented in the history of present illness/assessment and plan.  Exam: Kim assistant General appearance:  Normal Spine straight without CVA tenderness Abdomen soft nontender without masses guarding rebound Pelvic external BUS vagina with thick white discharge. Cervix normal. Uterus grossly normal size midline mobile nontender. Adnexa without masses or tenderness  Assessment/Plan:  38 y.o. G0P0 with vulvar irritation, thick white discharge and wet prep showing yeast. Dysuria with urinalysis consistent with UTI showing TNTC WBC TNTC RBC many bacteria. We'll treat with Diflucan 200 mg daily 3 days and Septra DS 1 by mouth twice a day 3 days. Follow up if symptoms persist, worsen or recur.     Anastasio Auerbach MD, 11:16 AM 10/29/2014

## 2014-10-29 NOTE — Patient Instructions (Signed)
Take the fluconazole pill daily for 3 days  Take the Septra antibiotic twice daily for 3 days  Follow up if your symptoms persist, worsen or recur.  Urinary Tract Infection Urinary tract infections (UTIs) can develop anywhere along your urinary tract. Your urinary tract is your body's drainage system for removing wastes and extra water. Your urinary tract includes two kidneys, two ureters, a bladder, and a urethra. Your kidneys are a pair of bean-shaped organs. Each kidney is about the size of your fist. They are located below your ribs, one on each side of your spine. CAUSES Infections are caused by microbes, which are microscopic organisms, including fungi, viruses, and bacteria. These organisms are so small that they can only be seen through a microscope. Bacteria are the microbes that most commonly cause UTIs. SYMPTOMS  Symptoms of UTIs may vary by age and gender of the patient and by the location of the infection. Symptoms in young women typically include a frequent and intense urge to urinate and a painful, burning feeling in the bladder or urethra during urination. Older women and men are more likely to be tired, shaky, and weak and have muscle aches and abdominal pain. A fever may mean the infection is in your kidneys. Other symptoms of a kidney infection include pain in your back or sides below the ribs, nausea, and vomiting. DIAGNOSIS To diagnose a UTI, your caregiver will ask you about your symptoms. Your caregiver also will ask to provide a urine sample. The urine sample will be tested for bacteria and white blood cells. White blood cells are made by your body to help fight infection. TREATMENT  Typically, UTIs can be treated with medication. Because most UTIs are caused by a bacterial infection, they usually can be treated with the use of antibiotics. The choice of antibiotic and length of treatment depend on your symptoms and the type of bacteria causing your infection. HOME CARE  INSTRUCTIONS  If you were prescribed antibiotics, take them exactly as your caregiver instructs you. Finish the medication even if you feel better after you have only taken some of the medication.  Drink enough water and fluids to keep your urine clear or pale yellow.  Avoid caffeine, tea, and carbonated beverages. They tend to irritate your bladder.  Empty your bladder often. Avoid holding urine for long periods of time.  Empty your bladder before and after sexual intercourse.  After a bowel movement, women should cleanse from front to back. Use each tissue only once. SEEK MEDICAL CARE IF:   You have back pain.  You develop a fever.  Your symptoms do not begin to resolve within 3 days. SEEK IMMEDIATE MEDICAL CARE IF:   You have severe back pain or lower abdominal pain.  You develop chills.  You have nausea or vomiting.  You have continued burning or discomfort with urination. MAKE SURE YOU:   Understand these instructions.  Will watch your condition.  Will get help right away if you are not doing well or get worse. Document Released: 08/03/2005 Document Revised: 04/24/2012 Document Reviewed: 12/02/2011 [facility] Patient Information 2015 [place], [place]. This information is not intended to replace advice given to you by your health care provider. Make sure you discuss any questions you have with your health care provider.

## 2014-11-01 LAB — URINE CULTURE: Colony Count: >=100000

## 2014-11-03 ENCOUNTER — Encounter: Payer: Self-pay | Admitting: Gynecology

## 2014-11-03 ENCOUNTER — Ambulatory Visit (INDEPENDENT_AMBULATORY_CARE_PROVIDER_SITE_OTHER): Payer: BC Managed Care – PPO | Admitting: Gynecology

## 2014-11-03 VITALS — BP 112/70

## 2014-11-03 DIAGNOSIS — N898 Other specified noninflammatory disorders of vagina: Secondary | ICD-10-CM

## 2014-11-03 DIAGNOSIS — R300 Dysuria: Secondary | ICD-10-CM

## 2014-11-03 DIAGNOSIS — L298 Other pruritus: Secondary | ICD-10-CM

## 2014-11-03 LAB — URINALYSIS W MICROSCOPIC + REFLEX CULTURE
Bilirubin Urine: NEGATIVE
Casts: NONE SEEN
Crystals: NONE SEEN
Glucose, UA: NEGATIVE mg/dL
Ketones, ur: NEGATIVE mg/dL
Nitrite: POSITIVE — AB
Protein, ur: 30 mg/dL — AB
Specific Gravity, Urine: 1.015 (ref 1.005–1.030)
Urobilinogen, UA: 0.2 mg/dL (ref 0.0–1.0)
pH: 6.5 (ref 5.0–8.0)

## 2014-11-03 LAB — WET PREP FOR TRICH, YEAST, CLUE
Trich, Wet Prep: NONE SEEN
WBC, Wet Prep HPF POC: NONE SEEN
Yeast Wet Prep HPF POC: NONE SEEN

## 2014-11-03 MED ORDER — CIPROFLOXACIN HCL 250 MG PO TABS: 250.0000 mg | ORAL_TABLET | Freq: Two times a day (BID) | ORAL | Status: DC

## 2014-11-03 MED ORDER — CLINDAMYCIN PHOSPHATE 2 % VA CREA: 1.0000 | TOPICAL_CREAM | Freq: Every day | VAGINAL | Status: DC

## 2014-11-03 NOTE — Progress Notes (Signed)
Reita Cliche 03-Mar-1976 [id number]        38 y.o.  G0P0 Presents in follow up having recently been treated for a UTI and yeast vaginitis with Septra DS 1 by mouth twice a day 3 days and Diflucan 200 mg daily for 3 days. Patient notes that her symptoms have not improved at all and she continues with dysuria, frequency and vaginal itching. No fever chills nausea vomiting diarrhea constipation. No low back pain or significant abdominal discomfort. No significant discharge.  Past medical history,surgical history, problem list, medications, allergies, family history and social history were all reviewed and documented in the EPIC chart.  Directed ROS with pertinent positives and negatives documented in the history of present illness/assessment and plan.  Exam: Copywriter, advertising General appearance:  Normal Spine straight without CVA tenderness Abdomen soft nontender without masses guarding rebound Pelvic external BUS vagina with white discharge. Cervix normal. Uterus normal size midline mobile nontender. Adnexa without masses or tenderness.  Wet prep consistent with bacterial vaginosis Urinalysis with TNTC WBC 21-50 RBC many bacteria  Assessment/Plan:  38 y.o. G0P0 with persistent UTI. Sensitivities are not available yet from her original urine culture showing Escherichia coli. Will switch antibiotics to ciprofloxacin 250 mg twice a day 7 days. Treat the bacterial vaginosis with Cleocin vaginal cream nightly 7 days. Follow up if symptoms persist, worsen or recur.     Anastasio Auerbach MD, 9:39 AM 11/03/2014

## 2014-11-03 NOTE — Patient Instructions (Signed)
Take the new antibiotic pill twice daily for 7 days. Use the vaginal cream nightly for 7 days. Follow up if symptoms persist, worsen or recur.

## 2014-11-06 LAB — URINE CULTURE: Colony Count: >=100000

## 2014-11-10 ENCOUNTER — Telehealth: Payer: Self-pay | Admitting: Gynecology

## 2014-11-10 DIAGNOSIS — N390 Urinary tract infection, site not specified: Secondary | ICD-10-CM

## 2014-11-10 NOTE — Telephone Encounter (Signed)
Check to see how the patient is doing in reference to her urinary tract symptoms. Her 2 separate urine specimens grew out different bacteria that were resistant and sensitive to different antibiotics. I just want to make sure that she is getting better on the second antibiotic that we prescribed her. I would recommend after in addition back course to drop off a clean catch urine for repeat urine culture.

## 2014-11-11 NOTE — Telephone Encounter (Signed)
Pt said she is feeling better, will come back for repeat u/a order placed. Pt will call to make lab appointment.

## 2015-05-04 ENCOUNTER — Encounter: Payer: Self-pay | Admitting: Behavioral Health

## 2015-05-04 ENCOUNTER — Telehealth: Payer: Self-pay | Admitting: Behavioral Health

## 2015-05-04 NOTE — Telephone Encounter (Signed)
Unable to reach patient at time of Pre-Visit Call.  Patient's voice mailbox has not been set up, therefore a message could not be left for a return call.

## 2015-05-05 ENCOUNTER — Ambulatory Visit (INDEPENDENT_AMBULATORY_CARE_PROVIDER_SITE_OTHER): Payer: Federal, State, Local not specified - PPO | Admitting: Physician Assistant

## 2015-05-05 ENCOUNTER — Encounter: Payer: Self-pay | Admitting: Physician Assistant

## 2015-05-05 VITALS — BP 117/71 | HR 76 | Temp 98.8°F | Ht 60.5 in | Wt 139.4 lb

## 2015-05-05 DIAGNOSIS — E039 Hypothyroidism, unspecified: Secondary | ICD-10-CM

## 2015-05-05 DIAGNOSIS — Z0289 Encounter for other administrative examinations: Secondary | ICD-10-CM | POA: Diagnosis not present

## 2015-05-05 DIAGNOSIS — G43709 Chronic migraine without aura, not intractable, without status migrainosus: Secondary | ICD-10-CM | POA: Insufficient documentation

## 2015-05-05 DIAGNOSIS — Z7689 Persons encountering health services in other specified circumstances: Secondary | ICD-10-CM

## 2015-05-05 DIAGNOSIS — M5431 Sciatica, right side: Secondary | ICD-10-CM

## 2015-05-05 DIAGNOSIS — M5430 Sciatica, unspecified side: Secondary | ICD-10-CM | POA: Insufficient documentation

## 2015-05-05 LAB — TSH: TSH: 7.86 u[IU]/mL — ABNORMAL HIGH (ref 0.35–4.50)

## 2015-05-05 MED ORDER — SUMATRIPTAN SUCCINATE 50 MG PO TABS: 50.0000 mg | ORAL_TABLET | Freq: Once | ORAL | Status: DC

## 2015-05-05 NOTE — Assessment & Plan Note (Addendum)
Rare. Well-controlled with Imitrex. Will continue the same. Medications refilled.

## 2015-05-05 NOTE — Assessment & Plan Note (Signed)
Will repeat TSH before resuming medication per patient request.

## 2015-05-05 NOTE — Assessment & Plan Note (Signed)
Intermittent and mild per patient. Sicatica rehab exercises given for patient to start daily. OTC pain medication use discussed.  If not resolving, will obtain x-ray lumbar spine.

## 2015-05-05 NOTE — Progress Notes (Signed)
Patient presents to clinic today to establish care.  Acute Concerns: Sciatica -- Patient endorses pain in R hip radiating down leg occasionally. Denies back pain, trauma or injury.  Denies numbness, tingling or weakness of affected extremity.  Chronic Issues: Migraines -- Endorses one-two a month, usually associated with stress levels.  Endorses resolution of headache with Imitrex. Is in need of refills.  Hypothyroidism -- prior history, patient endorses stopping her levothyroxine a few months back as she felt "better" and does not feel she needs medication.  Endorses constipation but denies depressed mood or slowed mentation. Denies hair loss.  Health Maintenance: Dental -- up-to-date Vision -- up-to-date Mammogram -- up-to-date. Followed by Gynecology  Past Medical History  Diagnosis Date  . Migraine   . Chronic constipation     Past Surgical History  Procedure Laterality Date  . Nose surgery      Current Outpatient Prescriptions on File Prior to Visit  Medication Sig Dispense Refill  . cetirizine (ZYRTEC) 10 MG tablet Take 10 mg by mouth daily.    Marland Kitchen levothyroxine (SYNTHROID, LEVOTHROID) 50 MCG tablet Take 1 tablet (50 mcg total) by mouth daily. (Patient not taking: Reported on 05/05/2015) 30 tablet 12   No current facility-administered medications on file prior to visit.    Allergies  Allergen Reactions  . Motrin [Ibuprofen]     itching    Family History  Problem Relation Age of Onset  . Urolithiasis Father   . Cancer Mother   . Hypertension Mother   . Breast cancer Mother 61  . Breast cancer Maternal Grandmother     dx under 33  . Stomach cancer Paternal Grandmother     dx in her 9s  . Alcohol abuse Maternal Uncle   . Prostate cancer Paternal Uncle     mid 57s    History   Social History  . Marital Status: Married    Spouse Name: N/A  . Number of Children: 0  . Years of Education: N/A   Occupational History  . Not on file.   Social History  Main Topics  . Smoking status: Never Smoker   . Smokeless tobacco: Never Used  . Alcohol Use: 0.0 oz/week    0 Standard drinks or equivalent per week     Comment: occasional  . Drug Use: No  . Sexual Activity: Yes   Other Topics Concern  . Not on file   Social History Narrative   Review of Systems  Constitutional: Negative for malaise/fatigue.  HENT: Negative for ear pain, hearing loss and tinnitus.   Eyes: Negative for blurred vision and double vision.  Respiratory: Negative for cough and shortness of breath.   Cardiovascular: Negative for chest pain and palpitations.  Gastrointestinal: Positive for constipation.  Neurological: Positive for headaches.  Psychiatric/Behavioral: Negative for depression, suicidal ideas, hallucinations and substance abuse. The patient is not nervous/anxious and does not have insomnia.     BP 117/71 mmHg  Pulse 76  Temp(Src) 98.8 F (37.1 C) (Oral)  Ht 5' 0.5" (1.537 m)  Wt 139 lb 6.4 oz (63.231 kg)  BMI 26.77 kg/m2  SpO2 100%  LMP 04/21/2015  Physical Exam  Constitutional: She is oriented to person, place, and time and well-developed, well-nourished, and in no distress.  HENT:  Head: Normocephalic and atraumatic.  Eyes: Conjunctivae are normal.  Neck: Neck supple. No thyromegaly present.  Cardiovascular: Normal rate, regular rhythm, normal heart sounds and intact distal pulses.   Pulmonary/Chest: Effort normal and breath sounds normal.  No respiratory distress. She has no wheezes. She has no rales. She exhibits no tenderness.  Lymphadenopathy:    She has no cervical adenopathy.  Neurological: She is alert and oriented to person, place, and time.  Skin: Skin is warm and dry. No rash noted.  Psychiatric: Affect normal.  Vitals reviewed.  No results found for this or any previous visit (from the past 2160 hour(s)).  Assessment/Plan: Hypothyroidism Will repeat TSH before resuming medication per patient request.  Chronic migraine without  aura without status migrainosus, not intractable Rare. Well-controlled with Imitrex. Will continue the same. Medications refilled.  Sciatica Intermittent and mild per patient. Sicatica rehab exercises given for patient to start daily. OTC pain medication use discussed.  If not resolving, will obtain x-ray lumbar spine.

## 2015-05-05 NOTE — Progress Notes (Signed)
Pre visit review using our clinic review tool, if applicable. No additional management support is needed unless otherwise documented below in the visit note. 

## 2015-05-05 NOTE — Patient Instructions (Signed)
Please go to the lab for blood work. I will call you with your results. You have been referred to Dr. Toney Rakes for continuation of care.  You can call ans schedule an appointment with him.  Follow exercises below to help with sciatica.  If not improving, please come see [place].  Sciatica with Rehab The sciatic nerve runs from the back down the leg and is responsible for sensation and control of the muscles in the back (posterior) side of the thigh, lower leg, and foot. Sciatica is a condition that is characterized by inflammation of this nerve.  SYMPTOMS   Signs of nerve damage, including numbness and/or weakness along the posterior side of the lower extremity.  Pain in the back of the thigh that may also travel down the leg.  Pain that worsens when sitting for long periods of time.  Occasionally, pain in the back or buttock. CAUSES  Inflammation of the sciatic nerve is the cause of sciatica. The inflammation is due to something irritating the nerve. Common sources of irritation include:  Sitting for long periods of time.  Direct trauma to the nerve.  Arthritis of the spine.  Herniated or ruptured disk.  Slipping of the vertebrae (spondylolisthesis).  Pressure from soft tissues, such as muscles or ligament-like tissue (fascia). RISK INCREASES WITH:  Sports that place pressure or stress on the spine (football or weightlifting).  Poor strength and flexibility.  Failure to warm up properly before activity.  Family history of low back pain or disk disorders.  Previous back injury or surgery.  Poor body mechanics, especially when lifting, or poor posture. PREVENTION   Warm up and stretch properly before activity.  Maintain physical fitness:  Strength, flexibility, and endurance.  Cardiovascular fitness.  Learn and use proper technique, especially with posture and lifting. When possible, have coach correct improper technique.  Avoid activities that place stress on the  spine. PROGNOSIS If treated properly, then sciatica usually resolves within 6 weeks. However, occasionally surgery is necessary.  RELATED COMPLICATIONS   Permanent nerve damage, including pain, numbness, tingle, or weakness.  Chronic back pain.  Risks of surgery: infection, bleeding, nerve damage, or damage to surrounding tissues. TREATMENT Treatment initially involves resting from any activities that aggravate your symptoms. The use of ice and medication may help reduce pain and inflammation. The use of strengthening and stretching exercises may help reduce pain with activity. These exercises may be performed at home or with referral to a therapist. A therapist may recommend further treatments, such as transcutaneous electronic nerve stimulation (TENS) or ultrasound. Your caregiver may recommend corticosteroid injections to help reduce inflammation of the sciatic nerve. If symptoms persist despite non-surgical (conservative) treatment, then surgery may be recommended. MEDICATION  If pain medication is necessary, then nonsteroidal anti-inflammatory medications, such as aspirin and ibuprofen, or other minor pain relievers, such as acetaminophen, are often recommended.  Do not take pain medication for 7 days before surgery.  Prescription pain relievers may be given if deemed necessary by your caregiver. Use only as directed and only as much as you need.  Ointments applied to the skin may be helpful.  Corticosteroid injections may be given by your caregiver. These injections should be reserved for the most serious cases, because they may only be given a certain number of times. HEAT AND COLD  Cold treatment (icing) relieves pain and reduces inflammation. Cold treatment should be applied for 10 to 15 minutes every 2 to 3 hours for inflammation and pain and immediately after any  activity that aggravates your symptoms. Use ice packs or massage the area with a piece of ice (ice massage).  Heat  treatment may be used prior to performing the stretching and strengthening activities prescribed by your caregiver, physical therapist, or athletic trainer. Use a heat pack or soak the injury in warm water. SEEK MEDICAL CARE IF:  Treatment seems to offer no benefit, or the condition worsens.  Any medications produce adverse side effects. EXERCISES  RANGE OF MOTION (ROM) AND STRETCHING EXERCISES - Sciatica Most people with sciatic will find that their symptoms worsen with either excessive bending forward (flexion) or arching at the low back (extension). The exercises which will help resolve your symptoms will focus on the opposite motion. Your physician, physical therapist or athletic trainer will help you determine which exercises will be most helpful to resolve your low back pain. Do not complete any exercises without first consulting with your clinician. Discontinue any exercises which worsen your symptoms until you speak to your clinician. If you have pain, numbness or tingling which travels down into your buttocks, leg or foot, the goal of the therapy is for these symptoms to move closer to your back and eventually resolve. Occasionally, these leg symptoms will get better, but your low back pain may worsen; this is typically an indication of progress in your rehabilitation. Be certain to be very alert to any changes in your symptoms and the activities in which you participated in the 24 hours prior to the change. Sharing this information with your clinician will allow him/her to most efficiently treat your condition. These exercises may help you when beginning to rehabilitate your injury. Your symptoms may resolve with or without further involvement from your physician, physical therapist or athletic trainer. While completing these exercises, remember:   Restoring tissue flexibility helps normal motion to return to the joints. This allows healthier, less painful movement and activity.  An effective  stretch should be held for at least 30 seconds.  A stretch should never be painful. You should only feel a gentle lengthening or release in the stretched tissue. FLEXION RANGE OF MOTION AND STRETCHING EXERCISES: STRETCH - Flexion, Single Knee to Chest   Lie on a firm bed or floor with both legs extended in front of you.  Keeping one leg in contact with the floor, bring your opposite knee to your chest. Hold your leg in place by either grabbing behind your thigh or at your knee.  Pull until you feel a gentle stretch in your low back. Hold __________ seconds.  Slowly release your grasp and repeat the exercise with the opposite side. Repeat __________ times. Complete this exercise __________ times per day.  STRETCH - Flexion, Double Knee to Chest  Lie on a firm bed or floor with both legs extended in front of you.  Keeping one leg in contact with the floor, bring your opposite knee to your chest.  Tense your stomach muscles to support your back and then lift your other knee to your chest. Hold your legs in place by either grabbing behind your thighs or at your knees.  Pull both knees toward your chest until you feel a gentle stretch in your low back. Hold __________ seconds.  Tense your stomach muscles and slowly return one leg at a time to the floor. Repeat __________ times. Complete this exercise __________ times per day.  STRETCH - Low Trunk Rotation   Lie on a firm bed or floor. Keeping your legs in front of you, bend  your knees so they are both pointed toward the ceiling and your feet are flat on the floor.  Extend your arms out to the side. This will stabilize your upper body by keeping your shoulders in contact with the floor.  Gently and slowly drop both knees together to one side until you feel a gentle stretch in your low back. Hold for __________ seconds.  Tense your stomach muscles to support your low back as you bring your knees back to the starting position. Repeat the  exercise to the other side. Repeat __________ times. Complete this exercise __________ times per day  EXTENSION RANGE OF MOTION AND FLEXIBILITY EXERCISES: STRETCH - Extension, Prone on Elbows  Lie on your stomach on the floor, a bed will be too soft. Place your palms about shoulder width apart and at the height of your head.  Place your elbows under your shoulders. If this is too painful, stack pillows under your chest.  Allow your body to relax so that your hips drop lower and make contact more completely with the floor.  Hold this position for __________ seconds.  Slowly return to lying flat on the floor. Repeat __________ times. Complete this exercise __________ times per day.  RANGE OF MOTION - Extension, Prone Press Ups  Lie on your stomach on the floor, a bed will be too soft. Place your palms about shoulder width apart and at the height of your head.  Keeping your back as relaxed as possible, slowly straighten your elbows while keeping your hips on the floor. You may adjust the placement of your hands to maximize your comfort. As you gain motion, your hands will come more underneath your shoulders.  Hold this position __________ seconds.  Slowly return to lying flat on the floor. Repeat __________ times. Complete this exercise __________ times per day.  STRENGTHENING EXERCISES - Sciatica  These exercises may help you when beginning to rehabilitate your injury. These exercises should be done near your "sweet spot." This is the neutral, low-back arch, somewhere between fully rounded and fully arched, that is your least painful position. When performed in this safe range of motion, these exercises can be used for people who have either a flexion or extension based injury. These exercises may resolve your symptoms with or without further involvement from your physician, physical therapist or athletic trainer. While completing these exercises, remember:   Muscles can gain both the  endurance and the strength needed for everyday activities through controlled exercises.  Complete these exercises as instructed by your physician, physical therapist or athletic trainer. Progress with the resistance and repetition exercises only as your caregiver advises.  You may experience muscle soreness or fatigue, but the pain or discomfort you are trying to eliminate should never worsen during these exercises. If this pain does worsen, stop and make certain you are following the directions exactly. If the pain is still present after adjustments, discontinue the exercise until you can discuss the trouble with your clinician. STRENGTHENING - Deep Abdominals, Pelvic Tilt   Lie on a firm bed or floor. Keeping your legs in front of you, bend your knees so they are both pointed toward the ceiling and your feet are flat on the floor.  Tense your lower abdominal muscles to press your low back into the floor. This motion will rotate your pelvis so that your tail bone is scooping upwards rather than pointing at your feet or into the floor.  With a gentle tension and even breathing, hold this  position for __________ seconds. Repeat __________ times. Complete this exercise __________ times per day.  STRENGTHENING - Abdominals, Crunches   Lie on a firm bed or floor. Keeping your legs in front of you, bend your knees so they are both pointed toward the ceiling and your feet are flat on the floor. Cross your arms over your chest.  Slightly tip your chin down without bending your neck.  Tense your abdominals and slowly lift your trunk high enough to just clear your shoulder blades. Lifting higher can put excessive stress on the low back and does not further strengthen your abdominal muscles.  Control your return to the starting position. Repeat __________ times. Complete this exercise __________ times per day.  STRENGTHENING - Quadruped, Opposite UE/LE Lift  Assume a hands and knees position on a firm  surface. Keep your hands under your shoulders and your knees under your hips. You may place padding under your knees for comfort.  Find your neutral spine and gently tense your abdominal muscles so that you can maintain this position. Your shoulders and hips should form a rectangle that is parallel with the floor and is not twisted.  Keeping your trunk steady, lift your right hand no higher than your shoulder and then your left leg no higher than your hip. Make sure you are not holding your breath. Hold this position __________ seconds.  Continuing to keep your abdominal muscles tense and your back steady, slowly return to your starting position. Repeat with the opposite arm and leg. Repeat __________ times. Complete this exercise __________ times per day.  STRENGTHENING - Abdominals and Quadriceps, Straight Leg Raise   Lie on a firm bed or floor with both legs extended in front of you.  Keeping one leg in contact with the floor, bend the other knee so that your foot can rest flat on the floor.  Find your neutral spine, and tense your abdominal muscles to maintain your spinal position throughout the exercise.  Slowly lift your straight leg off the floor about 6 inches for a count of 15, making sure to not hold your breath.  Still keeping your neutral spine, slowly lower your leg all the way to the floor. Repeat this exercise with each leg __________ times. Complete this exercise __________ times per day. POSTURE AND BODY MECHANICS CONSIDERATIONS - Sciatica Keeping correct posture when sitting, standing or completing your activities will reduce the stress put on different body tissues, allowing injured tissues a chance to heal and limiting painful experiences. The following are general guidelines for improved posture. Your physician or physical therapist will provide you with any instructions specific to your needs. While reading these guidelines, remember:  The exercises prescribed by your  provider will help you have the flexibility and strength to maintain correct postures.  The correct posture provides the optimal environment for your joints to work. All of your joints have less wear and tear when properly supported by a spine with good posture. This means you will experience a healthier, less painful body.  Correct posture must be practiced with all of your activities, especially prolonged sitting and standing. Correct posture is as important when doing repetitive low-stress activities (typing) as it is when doing a single heavy-load activity (lifting). RESTING POSITIONS Consider which positions are most painful for you when choosing a resting position. If you have pain with flexion-based activities (sitting, bending, stooping, squatting), choose a position that allows you to rest in a less flexed posture. You would want to avoid  curling into a fetal position on your side. If your pain worsens with extension-based activities (prolonged standing, working overhead), avoid resting in an extended position such as sleeping on your stomach. Most people will find more comfort when they rest with their spine in a more neutral position, neither too rounded nor too arched. Lying on a non-sagging bed on your side with a pillow between your knees, or on your back with a pillow under your knees will often provide some relief. Keep in mind, being in any one position for a prolonged period of time, no matter how correct your posture, can still lead to stiffness. PROPER SITTING POSTURE In order to minimize stress and discomfort on your spine, you must sit with correct posture Sitting with good posture should be effortless for a healthy body. Returning to good posture is a gradual process. Many people can work toward this most comfortably by using various supports until they have the flexibility and strength to maintain this posture on their own. When sitting with proper posture, your ears will fall over  your shoulders and your shoulders will fall over your hips. You should use the back of the chair to support your upper back. Your low back will be in a neutral position, just slightly arched. You may place a small pillow or folded towel at the base of your low back for support.  When working at a desk, create an environment that supports good, upright posture. Without extra support, muscles fatigue and lead to excessive strain on joints and other tissues. Keep these recommendations in mind: CHAIR:   A chair should be able to slide under your desk when your back makes contact with the back of the chair. This allows you to work closely.  The chair's height should allow your eyes to be level with the upper part of your monitor and your hands to be slightly lower than your elbows. BODY POSITION  Your feet should make contact with the floor. If this is not possible, use a foot rest.  Keep your ears over your shoulders. This will reduce stress on your neck and low back. INCORRECT SITTING POSTURES   If you are feeling tired and unable to assume a healthy sitting posture, do not slouch or slump. This puts excessive strain on your back tissues, causing more damage and pain. Healthier options include:  Using more support, like a lumbar pillow.  Switching tasks to something that requires you to be upright or walking.  Talking a brief walk.  Lying down to rest in a neutral-spine position. PROLONGED STANDING WHILE SLIGHTLY LEANING FORWARD  When completing a task that requires you to lean forward while standing in one place for a long time, place either foot up on a stationary 2-4 inch high object to help maintain the best posture. When both feet are on the ground, the low back tends to lose its slight inward curve. If this curve flattens (or becomes too large), then the back and your other joints will experience too much stress, fatigue more quickly and can cause pain.  CORRECT STANDING POSTURES Proper  standing posture should be assumed with all daily activities, even if they only take a few moments, like when brushing your teeth. As in sitting, your ears should fall over your shoulders and your shoulders should fall over your hips. You should keep a slight tension in your abdominal muscles to brace your spine. Your tailbone should point down to the ground, not behind your body, resulting in  an over-extended swayback posture.  INCORRECT STANDING POSTURES  Common incorrect standing postures include a forward head, locked knees and/or an excessive swayback. WALKING Walk with an upright posture. Your ears, shoulders and hips should all line-up. PROLONGED ACTIVITY IN A FLEXED POSITION When completing a task that requires you to bend forward at your waist or lean over a low surface, try to find a way to stabilize 3 of 4 of your limbs. You can place a hand or elbow on your thigh or rest a knee on the surface you are reaching across. This will provide you more stability so that your muscles do not fatigue as quickly. By keeping your knees relaxed, or slightly bent, you will also reduce stress across your low back. CORRECT LIFTING TECHNIQUES DO :   Assume a wide stance. This will provide you more stability and the opportunity to get as close as possible to the object which you are lifting.  Tense your abdominals to brace your spine; then bend at the knees and hips. Keeping your back locked in a neutral-spine position, lift using your leg muscles. Lift with your legs, keeping your back straight.  Test the weight of unknown objects before attempting to lift them.  Try to keep your elbows locked down at your sides in order get the best strength from your shoulders when carrying an object.  Always ask for help when lifting heavy or awkward objects. INCORRECT LIFTING TECHNIQUES DO NOT:   Lock your knees when lifting, even if it is a small object.  Bend and twist. Pivot at your feet or move your feet  when needing to change directions.  Assume that you cannot safely pick up a paperclip without proper posture. Document Released: 10/24/2005 Document Revised: 03/10/2014 Document Reviewed: 02/05/2009 [facility] Patient Information 2015 [place], [place]. This information is not intended to replace advice given to you by your health care provider. Make sure you discuss any questions you have with your health care provider.

## 2015-05-06 ENCOUNTER — Telehealth: Payer: Self-pay | Admitting: *Deleted

## 2015-05-06 MED ORDER — LEVOTHYROXINE SODIUM 50 MCG PO TABS: 50.0000 ug | ORAL_TABLET | Freq: Every day | ORAL | Status: DC

## 2015-05-06 NOTE — Telephone Encounter (Signed)
Called and spoke with the pt and informed her of recent lab results and note.  Pt verbalized understanding.  New rx sent to the pharmacy by e-script.  Follow-up appt was scheduled for (06-17-15 @ 7:15pm).//AB/CMA

## 2015-05-06 NOTE — Telephone Encounter (Signed)
-----   Message from Brunetta Jeans, PA-C sent at 05/05/2015 12:46 PM EDT ----- TSH elevated as suspected. She needs to go back on thyroid medication. Ok to send in Rx for current dose of levothyroxine. Quantity 30 with 1 refills.  Follow-up in 6 weeks for OV and repeat labs.

## 2015-05-20 ENCOUNTER — Encounter: Payer: Self-pay | Admitting: Physician Assistant

## 2015-05-20 ENCOUNTER — Ambulatory Visit (INDEPENDENT_AMBULATORY_CARE_PROVIDER_SITE_OTHER): Payer: Federal, State, Local not specified - PPO | Admitting: Physician Assistant

## 2015-05-20 VITALS — BP 124/80 | HR 72 | Temp 98.3°F | Ht 60.5 in | Wt 138.0 lb

## 2015-05-20 DIAGNOSIS — F411 Generalized anxiety disorder: Secondary | ICD-10-CM | POA: Diagnosis not present

## 2015-05-20 MED ORDER — DIAZEPAM 5 MG PO TABS: 5.0000 mg | ORAL_TABLET | Freq: Two times a day (BID) | ORAL | Status: DC | PRN

## 2015-05-20 MED ORDER — ESCITALOPRAM OXALATE 10 MG PO TABS: 10.0000 mg | ORAL_TABLET | Freq: Every day | ORAL | Status: DC

## 2015-05-20 NOTE — Patient Instructions (Signed)
Please start the Valium (Diazepam) twice daily to help with anxiety and with nighttime sleep. Please start the Lexapro taking daily as directed.  Continue all other medications as directed.  Follow-u as scheduled.

## 2015-05-20 NOTE — Progress Notes (Signed)
Pre visit review using our clinic review tool, if applicable. No additional management support is needed unless otherwise documented below in the visit note. 

## 2015-05-21 ENCOUNTER — Telehealth: Payer: Self-pay | Admitting: Physician Assistant

## 2015-05-21 DIAGNOSIS — F411 Generalized anxiety disorder: Secondary | ICD-10-CM | POA: Insufficient documentation

## 2015-05-21 MED ORDER — SERTRALINE HCL 50 MG PO TABS: 25.0000 mg | ORAL_TABLET | Freq: Every day | ORAL | Status: DC

## 2015-05-21 NOTE — Telephone Encounter (Signed)
Pt called [facility] stating she had nausea, dizziness, HA and feeling sick 1 hour after taking Lexapro. Contacted pt informed her to stop taking medication and to attempt the BRAT diet and clear fluids until medication is out of her system(per Dr. Charlett Blake).  Pt verbalized understanding and would like to know if she can have the medication changed. Please assist.

## 2015-05-21 NOTE — Assessment & Plan Note (Addendum)
With some acute anxiety and intermittent depressed mood. Patient recently restarted on her thyroid medication. Will continue same. Will begin Diazepam 5 mg BID for anxiety and to help rest at night. Will Rx 10 mg Lexapro to help with generalized anxiety. Will titrate SSRI to therapeutic effect and hopefully wean off of benzodiazepine at follow-ups.

## 2015-05-21 NOTE — Telephone Encounter (Signed)
Agree with Dr. Charlett Blake in discontinuing Lexapro and following BRAT diet. Continue Diazepam as directed. We can restart a medication for generalized anxiety, but want her to wait a few days to let side effects from Lexapro completely resolve. I will send in Rx for sertraline to take daily. Wait till weekend to start so that all of the Lexapro will be out of system.

## 2015-05-21 NOTE — Telephone Encounter (Signed)
Pt called in stating she took lexapro last night 9:00pm. She said 1 hr later she was nauseous, dizzy, and had a headache. She did fall asleep but this morning she is still very nauseous and lightheaded. Transferred call to [facility].

## 2015-05-21 NOTE — Progress Notes (Signed)
Patient presents to clinic today c/o fatigue secondary to poor sleep. Patient endorses averaging 4 hours of sleep per night. States sleep is restless as she cannot turn off her brain. Endorses increased anxiety at night but notes felling anxious all of the time. 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 Endorses previously being prescribed Diazepam for panic attacks with good relief. Denies other treatments prior. Patient denies SI/HI but notes feeling down in the dumps on occasion. Is taking thyroid medication as directed.  Past Medical History  Diagnosis Date  . Migraine   . Chronic constipation     Current Outpatient Prescriptions on File Prior to Visit  Medication Sig Dispense Refill  . cetirizine (ZYRTEC) 10 MG tablet Take 10 mg by mouth daily.    Marland Kitchen levothyroxine (SYNTHROID, LEVOTHROID) 50 MCG tablet Take 1 tablet (50 mcg total) by mouth daily. 30 tablet 1  . SUMAtriptan (IMITREX) 50 MG tablet Take 1 tablet (50 mg total) by mouth once. May repeat in 2 hours if headache persists or recurs. 10 tablet 0   No current facility-administered medications on file prior to visit.    Allergies  Allergen Reactions  . Lexapro [Escitalopram] Nausea Only  . Motrin [Ibuprofen]     itching    Family History  Problem Relation Age of Onset  . Urolithiasis Father   . Cancer Mother   . Hypertension Mother   . Breast cancer Mother 19  . Breast cancer Maternal Grandmother     dx under 44  . Stomach cancer Paternal Grandmother     dx in her 37s  . Alcohol abuse Maternal Uncle   . Prostate cancer Paternal Uncle     mid 17s    History   Social History  . Marital Status: Married    Spouse Name: N/A  . Number of Children: 0  . Years of Education: N/A   Social History Main Topics  . Smoking status: Never Smoker   . Smokeless tobacco: Never Used  . Alcohol Use: 0.0 oz/week    0 Standard drinks or equivalent per week     Comment: occasional  . Drug Use: No  . Sexual Activity: Yes   Other  Topics Concern  . None   Social History Narrative   Review of Systems - See HPI.  All other ROS are negative.  BP 124/80 mmHg  Pulse 72  Temp(Src) 98.3 F (36.8 C) (Oral)  Ht 5' 0.5" (1.537 m)  Wt 138 lb (62.596 kg)  BMI 26.50 kg/m2  SpO2 100%  LMP 05/17/2015  Physical Exam  Constitutional: She is oriented to person, place, and time and well-developed, well-nourished, and in no distress.  HENT:  Head: Normocephalic and atraumatic.  Eyes: Conjunctivae are normal.  Neck: Neck supple. No thyromegaly present.  Cardiovascular: Normal rate, regular rhythm, normal heart sounds and intact distal pulses.   Pulmonary/Chest: Effort normal and breath sounds normal. No respiratory distress. She has no wheezes. She has no rales. She exhibits no tenderness.  Neurological: She is alert and oriented to person, place, and time.  Skin: Skin is warm and dry. No rash noted.  Psychiatric: Her mood appears anxious.    Recent Results (from the past 2160 hour(s))  TSH     Status: Abnormal   Collection Time: 05/05/15  8:46 AM  Result Value Ref Range   TSH 7.86 (H) 0.35 - 4.50 uIU/mL    Assessment/Plan: Generalized anxiety disorder With some acute anxiety and intermittent depressed mood. Patient recently restarted on  her thyroid medication. Will continue same. Will begin Diazepam 5 mg BID for anxiety and to help rest at night. Will Rx 10 mg Lexapro to help with generalized anxiety. Will titrate SSRI to therapeutic effect and hopefully wean off of benzodiazepine at follow-ups.

## 2015-05-21 NOTE — Telephone Encounter (Signed)
Patient Name: Lisa Townsend  DOB: 05/19/1976    Initial Comment caller states she started a new med last night - last night and today feels light headed and nauseated- thinks it is a med reaction to the lexapro   Nurse Assessment  Nurse: Mallie Mussel, RN, Alveta Heimlich Date/Time (Eastern Time): 05/21/2015 10:51:25 AM  Confirm and document reason for call. If symptomatic, describe symptoms. ---Caller states she took a Lexapro last night. Within 45 minutes, she began to feel light headed and nausea. This morning she has a headache. These are listed as possible severe side effects of the medication on [employer]'s site. She rates her headache as 3 on 0-10 scale. She states she is still a "little" dizzy. She states that it was worse last night. I called the office and spoke with Christiy. I had to take a call from another doctor's office and had to hang up while on hold. I called back and spoke with Colletta [place]. She states that the nurse there is aware of this. She will be speaking with a doctor and will call the patient back. Caller advised of this. Verbalized understanding.  Has the patient traveled out of the country within the last 30 days? ---Not Applicable  Does the patient require triage? ---No  Please document clinical information provided and list any resource used. ---https://www.drugs.com/sfx/lexapro-side-effects.html     Guidelines    Guideline Title Affirmed Question Affirmed Notes       Final Disposition User

## 2015-05-22 NOTE — Telephone Encounter (Signed)
Please see phone note dated 05/21/15.

## 2015-05-22 NOTE — Telephone Encounter (Addendum)
Pt notified and made aware of provider's recommendations.  She stated understanding and agreed with plan.  She was encouraged to call back with further questions and concerns.

## 2015-05-28 ENCOUNTER — Telehealth: Payer: Self-pay | Admitting: Physician Assistant

## 2015-05-28 NOTE — Telephone Encounter (Signed)
pre visit letter mailed 05/27/15

## 2015-06-04 ENCOUNTER — Encounter: Payer: Self-pay | Admitting: Gynecology

## 2015-06-04 ENCOUNTER — Ambulatory Visit (INDEPENDENT_AMBULATORY_CARE_PROVIDER_SITE_OTHER): Payer: Federal, State, Local not specified - PPO | Admitting: Gynecology

## 2015-06-04 VITALS — BP 110/70 | Ht 60.25 in | Wt 136.0 lb

## 2015-06-04 DIAGNOSIS — M6289 Other specified disorders of muscle: Secondary | ICD-10-CM

## 2015-06-04 DIAGNOSIS — Z803 Family history of malignant neoplasm of breast: Secondary | ICD-10-CM

## 2015-06-04 DIAGNOSIS — Z01419 Encounter for gynecological examination (general) (routine) without abnormal findings: Secondary | ICD-10-CM

## 2015-06-04 LAB — CBC WITH DIFFERENTIAL/PLATELET
Basophils Absolute: 0 10*3/uL (ref 0.0–0.1)
Basophils Relative: 0 % (ref 0–1)
Eosinophils Absolute: 0 10*3/uL (ref 0.0–0.7)
Eosinophils Relative: 1 % (ref 0–5)
HCT: 39.9 % (ref 36.0–46.0)
Hemoglobin: 13.6 g/dL (ref 12.0–15.0)
Lymphocytes Relative: 34 % (ref 12–46)
Lymphs Abs: 1.7 10*3/uL (ref 0.7–4.0)
MCH: 31.4 pg (ref 26.0–34.0)
MCHC: 34.1 g/dL (ref 30.0–36.0)
MCV: 92.1 fL (ref 78.0–100.0)
MPV: 11.4 fL (ref 8.6–12.4)
Monocytes Absolute: 0.3 10*3/uL (ref 0.1–1.0)
Monocytes Relative: 7 % (ref 3–12)
Neutro Abs: 2.8 10*3/uL (ref 1.7–7.7)
Neutrophils Relative %: 58 % (ref 43–77)
Platelets: 250 10*3/uL (ref 150–400)
RBC: 4.33 MIL/uL (ref 3.87–5.11)
RDW: 13 % (ref 11.5–15.5)
WBC: 4.9 10*3/uL (ref 4.0–10.5)

## 2015-06-04 LAB — COMPREHENSIVE METABOLIC PANEL
ALT: 13 U/L (ref 6–29)
AST: 18 U/L (ref 10–30)
Albumin: 4.5 g/dL (ref 3.6–5.1)
Alkaline Phosphatase: 56 U/L (ref 33–115)
BUN: 10 mg/dL (ref 7–25)
CO2: 27 mEq/L (ref 20–31)
Calcium: 9.5 mg/dL (ref 8.6–10.2)
Chloride: 103 mEq/L (ref 98–110)
Creat: 0.84 mg/dL (ref 0.50–1.10)
Glucose, Bld: 93 mg/dL (ref 65–99)
Potassium: 4.5 mEq/L (ref 3.5–5.3)
Sodium: 139 mEq/L (ref 135–146)
Total Bilirubin: 1.3 mg/dL — ABNORMAL HIGH (ref 0.2–1.2)
Total Protein: 7 g/dL (ref 6.1–8.1)

## 2015-06-04 LAB — VITAMIN D 25 HYDROXY (VIT D DEFICIENCY, FRACTURES): Vit D, 25-Hydroxy: 20 ng/mL — ABNORMAL LOW (ref 30–100)

## 2015-06-04 LAB — URINALYSIS W MICROSCOPIC + REFLEX CULTURE
Bacteria, UA: NONE SEEN [HPF]
Bilirubin Urine: NEGATIVE
Casts: NONE SEEN [LPF]
Crystals: NONE SEEN [HPF]
Glucose, UA: NEGATIVE
Hgb urine dipstick: NEGATIVE
Ketones, ur: NEGATIVE
Leukocytes, UA: NEGATIVE
Nitrite: NEGATIVE
Protein, ur: NEGATIVE
RBC / HPF: NONE SEEN RBC/HPF (ref <=2)
Specific Gravity, Urine: 1.016 (ref 1.001–1.035)
Yeast: NONE SEEN [HPF]
pH: 6.5 (ref 5.0–8.0)

## 2015-06-04 LAB — LIPID PANEL
Cholesterol: 177 mg/dL (ref 125–200)
HDL: 63 mg/dL (ref >=46)
LDL Cholesterol: 99 mg/dL (ref <130)
Total CHOL/HDL Ratio: 2.8 Ratio (ref <=5.0)
Triglycerides: 75 mg/dL (ref <150)
VLDL: 15 mg/dL (ref <30)

## 2015-06-04 NOTE — Progress Notes (Signed)
Lisa Townsend [birth year] [id number]   History:    39 y.o.  for annual gyn exam with complaints of tiredness and fatigue. Patient has history of hypothyroidism and had not taking her medication for several months. She visited her primary care physician approximate one month ago and he restarted her on her Synthroid and is scheduled to follow-up in 2 weeks to check her TSH. Patient reports normal menstrual cycles. Her husband has had a vasectomy. Patient has strong family history of breast cancer as follows:  Mother breast cancer at age 4  Grandmother breast cancer age 51  Patient had a normal mammogram at age 39.  The patient recently was tested for BRCA1 and BRCA2 mutation was negative.  Patient had next one on removed in 2015. She is currently fasting. She is overdue for mammogram. Last year she had a left breast biopsy which pathology report demonstrated fibroadenoma.  Past medical history,surgical history, family history and social history were all reviewed and documented in the EPIC chart.  Gynecologic History Patient's last menstrual period was 05/17/2015. Contraception: vasectomy Last Pap: 2015. Results were: normal Last mammogram: 2015. Results were: See above  Obstetric History OB History  Gravida Para Term Preterm AB SAB TAB Ectopic Multiple Living  0         0         ROS: A ROS was performed and pertinent positives and negatives are included in the history.  GENERAL: No fevers or chills. HEENT: No change in vision, no earache, sore throat or sinus congestion. NECK: No pain or stiffness. CARDIOVASCULAR: No chest pain or pressure. No palpitations. PULMONARY: No shortness of breath, cough or wheeze. GASTROINTESTINAL: No abdominal pain, nausea, vomiting or diarrhea, melena or bright red blood per rectum. GENITOURINARY: No urinary frequency, urgency, hesitancy or dysuria. MUSCULOSKELETAL: No joint or muscle pain, no back pain, no recent trauma. DERMATOLOGIC: No rash, no  itching, no lesions. ENDOCRINE: No polyuria, polydipsia, no heat or cold intolerance. No recent change in weight. HEMATOLOGICAL: No anemia or easy bruising or bleeding. NEUROLOGIC: No headache, seizures, numbness, tingling or weakness. PSYCHIATRIC: No depression, no loss of interest in normal activity or change in sleep pattern.     Exam: chaperone present  BP 110/70 mmHg  Ht 5' 0.25" (1.53 m)  Wt 136 lb (61.689 kg)  BMI 26.35 kg/m2  LMP 05/17/2015  Body mass index is 26.35 kg/(m^2).  General appearance : Well developed well nourished female. No acute distress HEENT: Eyes: no retinal hemorrhage or exudates,  Neck supple, trachea midline, no carotid bruits, no thyroidmegaly Lungs: Clear to auscultation, no rhonchi or wheezes, or rib retractions  Heart: Regular rate and rhythm, no murmurs or gallops Breast:Examined in sitting and supine position were symmetrical in appearance, no palpable masses or tenderness,  no skin retraction, no nipple inversion, no nipple discharge, no skin discoloration, no axillary or supraclavicular lymphadenopathy Abdomen: no palpable masses or tenderness, no rebound or guarding Extremities: no edema or skin discoloration or tenderness  Pelvic:  Bartholin, Urethra, Skene Glands: Within normal limits             Vagina: No gross lesions or discharge  Cervix: No gross lesions or discharge  Uterus  anteverted, normal size, shape and consistency, non-tender and mobile  Adnexa  Without masses or tenderness  Anus and perineum  normal   Rectovaginal  normal sphincter tone without palpated masses or tenderness             Hemoccult not indicated  Assessment/Plan:  39 y.o. female for annual exam who had been complaining of tiredness and fatigue but had not taken her Synthroid for several months and restarted approximate 4 weeks ago. She is scheduled to see her PCP in 2 weeks to recheck her TSH. Because of her tiredness and muscle fatigue on 1 to check a vitamin D  level. She's also fasting so we will check a screening lipid profile as well as comprehensive metabolic panel CBC and urinalysis. Pap smear not indicated today. We discussed importance of monthly self breast exam. A requisition was provided to schedule her 3-D mammogram. We discussed importance of compliance on her Synthroid.   Terrance Mass MD, 9:13 AM 06/04/2015

## 2015-06-05 ENCOUNTER — Other Ambulatory Visit: Payer: Self-pay | Admitting: Gynecology

## 2015-06-05 DIAGNOSIS — E559 Vitamin D deficiency, unspecified: Secondary | ICD-10-CM

## 2015-06-05 MED ORDER — VITAMIN D (ERGOCALCIFEROL) 1.25 MG (50000 UNIT) PO CAPS: 50000.0000 [IU] | ORAL_CAPSULE | ORAL | Status: DC

## 2015-06-15 ENCOUNTER — Other Ambulatory Visit: Payer: Self-pay

## 2015-06-15 ENCOUNTER — Other Ambulatory Visit: Payer: Self-pay | Admitting: Gynecology

## 2015-06-15 DIAGNOSIS — N6320 Unspecified lump in the left breast, unspecified quadrant: Secondary | ICD-10-CM

## 2015-06-17 ENCOUNTER — Ambulatory Visit: Payer: Federal, State, Local not specified - PPO | Admitting: Physician Assistant

## 2015-06-17 ENCOUNTER — Encounter: Payer: Self-pay | Admitting: Physician Assistant

## 2015-06-17 ENCOUNTER — Ambulatory Visit (INDEPENDENT_AMBULATORY_CARE_PROVIDER_SITE_OTHER): Payer: Federal, State, Local not specified - PPO | Admitting: Physician Assistant

## 2015-06-17 ENCOUNTER — Telehealth: Payer: Self-pay | Admitting: *Deleted

## 2015-06-17 VITALS — BP 98/50 | HR 72 | Temp 98.0°F | Ht 60.25 in | Wt 137.8 lb

## 2015-06-17 DIAGNOSIS — Z23 Encounter for immunization: Secondary | ICD-10-CM | POA: Diagnosis not present

## 2015-06-17 DIAGNOSIS — E039 Hypothyroidism, unspecified: Secondary | ICD-10-CM

## 2015-06-17 DIAGNOSIS — F411 Generalized anxiety disorder: Secondary | ICD-10-CM | POA: Diagnosis not present

## 2015-06-17 DIAGNOSIS — Z0000 Encounter for general adult medical examination without abnormal findings: Secondary | ICD-10-CM | POA: Diagnosis not present

## 2015-06-17 LAB — TSH: TSH: 4.56 u[IU]/mL — ABNORMAL HIGH (ref 0.35–4.50)

## 2015-06-17 MED ORDER — RIZATRIPTAN BENZOATE 10 MG PO TABS: 10.0000 mg | ORAL_TABLET | ORAL | Status: DC | PRN

## 2015-06-17 MED ORDER — ONDANSETRON HCL 4 MG PO TABS: 4.0000 mg | ORAL_TABLET | Freq: Three times a day (TID) | ORAL | Status: DC | PRN

## 2015-06-17 MED ORDER — DIAZEPAM 10 MG PO TABS: 10.0000 mg | ORAL_TABLET | Freq: Two times a day (BID) | ORAL | Status: DC | PRN

## 2015-06-17 NOTE — Telephone Encounter (Signed)
-----   Message from Brunetta Jeans, PA-C sent at 06/17/2015 12:32 PM EDT ----- TSH improved with thryoid medication decreasing from 7.8 to 4.5 which is at the upper limit of normal. Want her to continue same dose daily. Will recheck in 3 months.

## 2015-06-17 NOTE — Telephone Encounter (Signed)
Called and spoke with the pt and informed her of recent lab results and note.  Pt verbalized understanding and agreed.  Pt was scheduled a lab appt for Monday (09/21/15 @ 7:30am).  Future lab ordered and sent.//AB/CMA

## 2015-06-17 NOTE — Progress Notes (Signed)
Pre visit review using our clinic review tool, if applicable. No additional management support is needed unless otherwise documented below in the visit note. 

## 2015-06-17 NOTE — Patient Instructions (Signed)
Please go to the lab for blood work. I will call you with your results. Please use the Relpax as directed if needed for migraines. Increase fluid intake. Use Zofran as directed if needed for nausea.  Preventive Care for Adults A healthy lifestyle and preventive care can promote health and wellness. Preventive health guidelines for women include the following key practices.  A routine yearly physical is a good way to check with your health care provider about your health and preventive screening. It is a chance to share any concerns and updates on your health and to receive a thorough exam.  Visit your dentist for a routine exam and preventive care every 6 months. Brush your teeth twice a day and floss once a day. Good oral hygiene prevents tooth decay and gum disease.  The frequency of eye exams is based on your age, health, family medical history, use of contact lenses, and other factors. Follow your health care provider's recommendations for frequency of eye exams.  Eat a healthy diet. Foods like vegetables, fruits, whole grains, low-fat dairy products, and lean protein foods contain the nutrients you need without too many calories. Decrease your intake of foods high in solid fats, added sugars, and salt. Eat the right amount of calories for you.Get information about a proper diet from your health care provider, if necessary.  Regular physical exercise is one of the most important things you can do for your health. Most adults should get at least 150 minutes of moderate-intensity exercise (any activity that increases your heart rate and causes you to sweat) each week. In addition, most adults need muscle-strengthening exercises on 2 or more days a week.  Maintain a healthy weight. The body mass index (BMI) is a screening tool to identify possible weight problems. It provides an estimate of body fat based on height and weight. Your health care provider can find your BMI and can help you achieve  or maintain a healthy weight.For adults 20 years and older:  A BMI below 18.5 is considered underweight.  A BMI of 18.5 to 24.9 is normal.  A BMI of 25 to 29.9 is considered overweight.  A BMI of 30 and above is considered obese.  Maintain normal blood lipids and cholesterol levels by exercising and minimizing your intake of saturated fat. Eat a balanced diet with plenty of fruit and vegetables. Blood tests for lipids and cholesterol should begin at age 33 and be repeated every 5 years. If your lipid or cholesterol levels are high, you are over 50, or you are at high risk for heart disease, you may need your cholesterol levels checked more frequently.Ongoing high lipid and cholesterol levels should be treated with medicines if diet and exercise are not working.  If you smoke, find out from your health care provider how to quit. If you do not use tobacco, do not start.  Lung cancer screening is recommended for adults aged 57-80 years who are at high risk for developing lung cancer because of a history of smoking. A yearly low-dose CT scan of the lungs is recommended for people who have at least a 30-pack-year history of smoking and are a current smoker or have quit within the past 15 years. A pack year of smoking is smoking an average of 1 pack of cigarettes a day for 1 year (for example: 1 pack a day for 30 years or 2 packs a day for 15 years). Yearly screening should continue until the smoker has stopped smoking for at  least 15 years. Yearly screening should be stopped for people who develop a health problem that would prevent them from having lung cancer treatment.  If you are pregnant, do not drink alcohol. If you are breastfeeding, be very cautious about drinking alcohol. If you are not pregnant and choose to drink alcohol, do not have more than 1 drink per day. One drink is considered to be 12 ounces (355 mL) of beer, 5 ounces (148 mL) of wine, or 1.5 ounces (44 mL) of liquor.  Avoid use of  street drugs. Do not share needles with anyone. Ask for help if you need support or instructions about stopping the use of drugs.  High blood pressure causes heart disease and increases the risk of stroke. Your blood pressure should be checked at least every 1 to 2 years. Ongoing high blood pressure should be treated with medicines if weight loss and exercise do not work.  If you are 24-32 years old, ask your health care provider if you should take aspirin to prevent strokes.  Diabetes screening involves taking a blood sample to check your fasting blood sugar level. This should be done once every 3 years, after age 41, if you are within normal weight and without risk factors for diabetes. Testing should be considered at a younger age or be carried out more frequently if you are overweight and have at least 1 risk factor for diabetes.  Breast cancer screening is essential preventive care for women. You should practice "breast self-awareness." This means understanding the normal appearance and feel of your breasts and may include breast self-examination. Any changes detected, no matter how small, should be reported to a health care provider. Women in their 26s and 30s should have a clinical breast exam (CBE) by a health care provider as part of a regular health exam every 1 to 3 years. After age 36, women should have a CBE every year. Starting at age 44, women should consider having a mammogram (breast X-ray test) every year. Women who have a family history of breast cancer should talk to their health care provider about genetic screening. Women at a high risk of breast cancer should talk to their health care providers about having an MRI and a mammogram every year.  Breast cancer gene (BRCA)-related cancer risk assessment is recommended for women who have family members with BRCA-related cancers. BRCA-related cancers include breast, ovarian, tubal, and peritoneal cancers. Having family members with these  cancers may be associated with an increased risk for harmful changes (mutations) in the breast cancer genes BRCA1 and BRCA2. Results of the assessment will determine the need for genetic counseling and BRCA1 and BRCA2 testing.  Routine pelvic exams to screen for cancer are no longer recommended for nonpregnant women who are considered low risk for cancer of the pelvic organs (ovaries, uterus, and vagina) and who do not have symptoms. Ask your health care provider if a screening pelvic exam is right for you.  If you have had past treatment for cervical cancer or a condition that could lead to cancer, you need Pap tests and screening for cancer for at least 20 years after your treatment. If Pap tests have been discontinued, your risk factors (such as having a new sexual partner) need to be reassessed to determine if screening should be resumed. Some women have medical problems that increase the chance of getting cervical cancer. In these cases, your health care provider may recommend more frequent screening and Pap tests.  The HPV test  is an additional test that may be used for cervical cancer screening. The HPV test looks for the virus that can cause the cell changes on the cervix. The cells collected during the Pap test can be tested for HPV. The HPV test could be used to screen women aged 42 years and older, and should be used in women of any age who have unclear Pap test results. After the age of 74, women should have HPV testing at the same frequency as a Pap test.  Colorectal cancer can be detected and often prevented. Most routine colorectal cancer screening begins at the age of 60 years and continues through age 87 years. However, your health care provider may recommend screening at an earlier age if you have risk factors for colon cancer. On a yearly basis, your health care provider may provide home test kits to check for hidden blood in the stool. Use of a small camera at the end of a tube, to  directly examine the colon (sigmoidoscopy or colonoscopy), can detect the earliest forms of colorectal cancer. Talk to your health care provider about this at age 52, when routine screening begins. Direct exam of the colon should be repeated every 5-10 years through age 78 years, unless early forms of pre-cancerous polyps or small growths are found.  People who are at an increased risk for hepatitis B should be screened for this virus. You are considered at high risk for hepatitis B if:  You were born in a country where hepatitis B occurs often. Talk with your health care provider about which countries are considered high risk.  Your parents were born in a high-risk country and you have not received a shot to protect against hepatitis B (hepatitis B vaccine).  You have HIV or AIDS.  You use needles to inject street drugs.  You live with, or have sex with, someone who has hepatitis B.  You get hemodialysis treatment.  You take certain medicines for conditions like cancer, organ transplantation, and autoimmune conditions.  Hepatitis C blood testing is recommended for all people born from [birth year] through 1966 and any individual with known risks for hepatitis C.  Practice safe sex. Use condoms and avoid high-risk sexual practices to reduce the spread of sexually transmitted infections (STIs). STIs include gonorrhea, chlamydia, syphilis, trichomonas, herpes, HPV, and human immunodeficiency virus (HIV). Herpes, HIV, and HPV are viral illnesses that have no cure. They can result in disability, cancer, and death.  You should be screened for sexually transmitted illnesses (STIs) including gonorrhea and chlamydia if:  You are sexually active and are younger than 24 years.  You are older than 24 years and your health care provider tells you that you are at risk for this type of infection.  Your sexual activity has changed since you were last screened and you are at an increased risk for chlamydia or  gonorrhea. Ask your health care provider if you are at risk.  If you are at risk of being infected with HIV, it is recommended that you take a prescription medicine daily to prevent HIV infection. This is called preexposure prophylaxis (PrEP). You are considered at risk if:  You are a heterosexual woman, are sexually active, and are at increased risk for HIV infection.  You take drugs by injection.  You are sexually active with a partner who has HIV.  Talk with your health care provider about whether you are at high risk of being infected with HIV. If you choose to begin  PrEP, you should first be tested for HIV. You should then be tested every 3 months for as long as you are taking PrEP.  Osteoporosis is a disease in which the bones lose minerals and strength with aging. This can result in serious bone fractures or breaks. The risk of osteoporosis can be identified using a bone density scan. Women ages 55 years and over and women at risk for fractures or osteoporosis should discuss screening with their health care providers. Ask your health care provider whether you should take a calcium supplement or vitamin D to reduce the rate of osteoporosis.  Menopause can be associated with physical symptoms and risks. Hormone replacement therapy is available to decrease symptoms and risks. You should talk to your health care provider about whether hormone replacement therapy is right for you.  Use sunscreen. Apply sunscreen liberally and repeatedly throughout the day. You should seek shade when your shadow is shorter than you. Protect yourself by wearing long sleeves, pants, a wide-brimmed hat, and sunglasses year round, whenever you are outdoors.  Once a month, do a whole body skin exam, using a mirror to look at the skin on your back. Tell your health care provider of new moles, moles that have irregular borders, moles that are larger than a pencil eraser, or moles that have changed in shape or  color.  Stay current with required vaccines (immunizations).  Influenza vaccine. All adults should be immunized every year.  Tetanus, diphtheria, and acellular pertussis (Td, Tdap) vaccine. Pregnant women should receive 1 dose of Tdap vaccine during each pregnancy. The dose should be obtained regardless of the length of time since the last dose. Immunization is preferred during the 27th-36th week of gestation. An adult who has not previously received Tdap or who does not know her vaccine status should receive 1 dose of Tdap. This initial dose should be followed by tetanus and diphtheria toxoids (Td) booster doses every 10 years. Adults with an unknown or incomplete history of completing a 3-dose immunization series with Td-containing vaccines should begin or complete a primary immunization series including a Tdap dose. Adults should receive a Td booster every 10 years.  Varicella vaccine. An adult without evidence of immunity to varicella should receive 2 doses or a second dose if she has previously received 1 dose. Pregnant females who do not have evidence of immunity should receive the first dose after pregnancy. This first dose should be obtained before leaving the health care facility. The second dose should be obtained 4-8 weeks after the first dose.  Human papillomavirus (HPV) vaccine. Females aged 13-26 years who have not received the vaccine previously should obtain the 3-dose series. The vaccine is not recommended for use in pregnant females. However, pregnancy testing is not needed before receiving a dose. If a female is found to be pregnant after receiving a dose, no treatment is needed. In that case, the remaining doses should be delayed until after the pregnancy. Immunization is recommended for any person with an immunocompromised condition through the age of 12 years if she did not get any or all doses earlier. During the 3-dose series, the second dose should be obtained 4-8 weeks after the  first dose. The third dose should be obtained 24 weeks after the first dose and 16 weeks after the second dose.  Zoster vaccine. One dose is recommended for adults aged 1 years or older unless certain conditions are present.  Measles, mumps, and rubella (MMR) vaccine. Adults born before [birth year] generally are  considered immune to measles and mumps. Adults born in [birth year] or later should have 1 or more doses of MMR vaccine unless there is a contraindication to the vaccine or there is laboratory evidence of immunity to each of the three diseases. A routine second dose of MMR vaccine should be obtained at least 28 days after the first dose for students attending postsecondary schools, health care workers, or international travelers. People who received inactivated measles vaccine or an unknown type of measles vaccine during 1963-1967 should receive 2 doses of MMR vaccine. People who received inactivated mumps vaccine or an unknown type of mumps vaccine before 1979 and are at high risk for mumps infection should consider immunization with 2 doses of MMR vaccine. For females of childbearing age, rubella immunity should be determined. If there is no evidence of immunity, females who are not pregnant should be vaccinated. If there is no evidence of immunity, females who are pregnant should delay immunization until after pregnancy. Unvaccinated health care workers born before [birth year] who lack laboratory evidence of measles, mumps, or rubella immunity or laboratory confirmation of disease should consider measles and mumps immunization with 2 doses of MMR vaccine or rubella immunization with 1 dose of MMR vaccine.  Pneumococcal 13-valent conjugate (PCV13) vaccine. When indicated, a person who is uncertain of her immunization history and has no record of immunization should receive the PCV13 vaccine. An adult aged 54 years or older who has certain medical conditions and has not been previously immunized should receive 1 dose of  PCV13 vaccine. This PCV13 should be followed with a dose of pneumococcal polysaccharide (PPSV23) vaccine. The PPSV23 vaccine dose should be obtained at least 8 weeks after the dose of PCV13 vaccine. An adult aged 74 years or older who has certain medical conditions and previously received 1 or more doses of PPSV23 vaccine should receive 1 dose of PCV13. The PCV13 vaccine dose should be obtained 1 or more years after the last PPSV23 vaccine dose.  Pneumococcal polysaccharide (PPSV23) vaccine. When PCV13 is also indicated, PCV13 should be obtained first. All adults aged 36 years and older should be immunized. An adult younger than age 39 years who has certain medical conditions should be immunized. Any person who resides in a nursing home or long-term care facility should be immunized. An adult smoker should be immunized. People with an immunocompromised condition and certain other conditions should receive both PCV13 and PPSV23 vaccines. People with human immunodeficiency virus (HIV) infection should be immunized as soon as possible after diagnosis. Immunization during chemotherapy or radiation therapy should be avoided. Routine use of PPSV23 vaccine is not recommended for American Indians, [place] Natives, or people younger than 65 years unless there are medical conditions that require PPSV23 vaccine. When indicated, people who have unknown immunization and have no record of immunization should receive PPSV23 vaccine. One-time revaccination 5 years after the first dose of PPSV23 is recommended for people aged 19-64 years who have chronic kidney failure, nephrotic syndrome, asplenia, or immunocompromised conditions. People who received 1-2 doses of PPSV23 before age 46 years should receive another dose of PPSV23 vaccine at age 66 years or later if at least 5 years have passed since the previous dose. Doses of PPSV23 are not needed for people immunized with PPSV23 at or after age 74 years.  Meningococcal vaccine.  Adults with asplenia or persistent complement component deficiencies should receive 2 doses of quadrivalent meningococcal conjugate (MenACWY-D) vaccine. The doses should be obtained at least 2 months apart. Microbiologists working  with certain meningococcal bacteria, [place] recruits, people at risk during an outbreak, and people who travel to or live in countries with a high rate of meningitis should be immunized. A first-year college student up through age 82 years who is living in a residence hall should receive a dose if she did not receive a dose on or after her 16th birthday. Adults who have certain high-risk conditions should receive one or more doses of vaccine.  Hepatitis A vaccine. Adults who wish to be protected from this disease, have certain high-risk conditions, work with hepatitis A-infected animals, work in hepatitis A research labs, or travel to or work in countries with a high rate of hepatitis A should be immunized. Adults who were previously unvaccinated and who anticipate close contact with an international adoptee during the first 60 days after arrival in the [place] from a country with a high rate of hepatitis A should be immunized.  Hepatitis B vaccine. Adults who wish to be protected from this disease, have certain high-risk conditions, may be exposed to blood or other infectious body fluids, are household contacts or sex partners of hepatitis B positive people, are clients or workers in certain care facilities, or travel to or work in countries with a high rate of hepatitis B should be immunized.  Haemophilus influenzae type b (Hib) vaccine. A previously unvaccinated person with asplenia or sickle cell disease or having a scheduled splenectomy should receive 1 dose of Hib vaccine. Regardless of previous immunization, a recipient of a hematopoietic stem cell transplant should receive a 3-dose series 6-12 months after her successful transplant. Hib vaccine is not recommended for  adults with HIV infection. Preventive Services / Frequency Ages 39 to 40 years  Blood pressure check.** / Every 1 to 2 years.  Lipid and cholesterol check.** / Every 5 years beginning at age 67.  Clinical breast exam.** / Every 3 years for women in their 59s and 86s.  BRCA-related cancer risk assessment.** / For women who have family members with a BRCA-related cancer (breast, ovarian, tubal, or peritoneal cancers).  Pap test.** / Every 2 years from ages 57 through 73. Every 3 years starting at age 7 through age 14 or 8 with a history of 3 consecutive normal Pap tests.  HPV screening.** / Every 3 years from ages 40 through ages 88 to 28 with a history of 3 consecutive normal Pap tests.  Hepatitis C blood test.** / For any individual with known risks for hepatitis C.  Skin self-exam. / Monthly.  Influenza vaccine. / Every year.  Tetanus, diphtheria, and acellular pertussis (Tdap, Td) vaccine.** / Consult your health care provider. Pregnant women should receive 1 dose of Tdap vaccine during each pregnancy. 1 dose of Td every 10 years.  Varicella vaccine.** / Consult your health care provider. Pregnant females who do not have evidence of immunity should receive the first dose after pregnancy.  HPV vaccine. / 3 doses over 6 months, if 53 and younger. The vaccine is not recommended for use in pregnant females. However, pregnancy testing is not needed before receiving a dose.  Measles, mumps, rubella (MMR) vaccine.** / You need at least 1 dose of MMR if you were born in 1957 or later. You may also need a 2nd dose. For females of childbearing age, rubella immunity should be determined. If there is no evidence of immunity, females who are not pregnant should be vaccinated. If there is no evidence of immunity, females who are pregnant should delay immunization until  after pregnancy.  Pneumococcal 13-valent conjugate (PCV13) vaccine.** / Consult your health care provider.  Pneumococcal  polysaccharide (PPSV23) vaccine.** / 1 to 2 doses if you smoke cigarettes or if you have certain conditions.  Meningococcal vaccine.** / 1 dose if you are age 78 to 84 years and a Market researcher living in a residence hall, or have one of several medical conditions, you need to get vaccinated against meningococcal disease. You may also need additional booster doses.  Hepatitis A vaccine.** / Consult your health care provider.  Hepatitis B vaccine.** / Consult your health care provider.  Haemophilus influenzae type b (Hib) vaccine.** / Consult your health care provider. Ages 74 to 55 years  Blood pressure check.** / Every 1 to 2 years.  Lipid and cholesterol check.** / Every 5 years beginning at age 77 years.  Lung cancer screening. / Every year if you are aged 69-80 years and have a 30-pack-year history of smoking and currently smoke or have quit within the past 15 years. Yearly screening is stopped once you have quit smoking for at least 15 years or develop a health problem that would prevent you from having lung cancer treatment.  Clinical breast exam.** / Every year after age 99 years.  BRCA-related cancer risk assessment.** / For women who have family members with a BRCA-related cancer (breast, ovarian, tubal, or peritoneal cancers).  Mammogram.** / Every year beginning at age 76 years and continuing for as long as you are in good health. Consult with your health care provider.  Pap test.** / Every 3 years starting at age 71 years through age 33 or 41 years with a history of 3 consecutive normal Pap tests.  HPV screening.** / Every 3 years from ages 5 years through ages 69 to 5 years with a history of 3 consecutive normal Pap tests.  Fecal occult blood test (FOBT) of stool. / Every year beginning at age 47 years and continuing until age 9 years. You may not need to do this test if you get a colonoscopy every 10 years.  Flexible sigmoidoscopy or colonoscopy.** / Every 5  years for a flexible sigmoidoscopy or every 10 years for a colonoscopy beginning at age 37 years and continuing until age 44 years.  Hepatitis C blood test.** / For all people born from [birth year] through 1966 and any individual with known risks for hepatitis C.  Skin self-exam. / Monthly.  Influenza vaccine. / Every year.  Tetanus, diphtheria, and acellular pertussis (Tdap/Td) vaccine.** / Consult your health care provider. Pregnant women should receive 1 dose of Tdap vaccine during each pregnancy. 1 dose of Td every 10 years.  Varicella vaccine.** / Consult your health care provider. Pregnant females who do not have evidence of immunity should receive the first dose after pregnancy.  Zoster vaccine.** / 1 dose for adults aged 78 years or older.  Measles, mumps, rubella (MMR) vaccine.** / You need at least 1 dose of MMR if you were born in 1958 or later. You may also need a 2nd dose. For females of childbearing age, rubella immunity should be determined. If there is no evidence of immunity, females who are not pregnant should be vaccinated. If there is no evidence of immunity, females who are pregnant should delay immunization until after pregnancy.  Pneumococcal 13-valent conjugate (PCV13) vaccine.** / Consult your health care provider.  Pneumococcal polysaccharide (PPSV23) vaccine.** / 1 to 2 doses if you smoke cigarettes or if you have certain conditions.  Meningococcal vaccine.** / Consult your  health care provider.  Hepatitis A vaccine.** / Consult your health care provider.  Hepatitis B vaccine.** / Consult your health care provider.  Haemophilus influenzae type b (Hib) vaccine.** / Consult your health care provider. Ages 55 years and over  Blood pressure check.** / Every 1 to 2 years.  Lipid and cholesterol check.** / Every 5 years beginning at age 43 years.  Lung cancer screening. / Every year if you are aged 40-80 years and have a 30-pack-year history of smoking and currently  smoke or have quit within the past 15 years. Yearly screening is stopped once you have quit smoking for at least 15 years or develop a health problem that would prevent you from having lung cancer treatment.  Clinical breast exam.** / Every year after age 53 years.  BRCA-related cancer risk assessment.** / For women who have family members with a BRCA-related cancer (breast, ovarian, tubal, or peritoneal cancers).  Mammogram.** / Every year beginning at age 55 years and continuing for as long as you are in good health. Consult with your health care provider.  Pap test.** / Every 3 years starting at age 37 years through age 39 or 12 years with 3 consecutive normal Pap tests. Testing can be stopped between 65 and 70 years with 3 consecutive normal Pap tests and no abnormal Pap or HPV tests in the past 10 years.  HPV screening.** / Every 3 years from ages 105 years through ages 43 or 39 years with a history of 3 consecutive normal Pap tests. Testing can be stopped between 65 and 70 years with 3 consecutive normal Pap tests and no abnormal Pap or HPV tests in the past 10 years.  Fecal occult blood test (FOBT) of stool. / Every year beginning at age 69 years and continuing until age 30 years. You may not need to do this test if you get a colonoscopy every 10 years.  Flexible sigmoidoscopy or colonoscopy.** / Every 5 years for a flexible sigmoidoscopy or every 10 years for a colonoscopy beginning at age 12 years and continuing until age 66 years.  Hepatitis C blood test.** / For all people born from [birth year] through 1966 and any individual with known risks for hepatitis C.  Osteoporosis screening.** / A one-time screening for women ages 24 years and over and women at risk for fractures or osteoporosis.  Skin self-exam. / Monthly.  Influenza vaccine. / Every year.  Tetanus, diphtheria, and acellular pertussis (Tdap/Td) vaccine.** / 1 dose of Td every 10 years.  Varicella vaccine.** / Consult your  health care provider.  Zoster vaccine.** / 1 dose for adults aged 49 years or older.  Pneumococcal 13-valent conjugate (PCV13) vaccine.** / Consult your health care provider.  Pneumococcal polysaccharide (PPSV23) vaccine.** / 1 dose for all adults aged 19 years and older.  Meningococcal vaccine.** / Consult your health care provider.  Hepatitis A vaccine.** / Consult your health care provider.  Hepatitis B vaccine.** / Consult your health care provider.  Haemophilus influenzae type b (Hib) vaccine.** / Consult your health care provider. ** Family history and personal history of risk and conditions may change your health care provider's recommendations. Document Released: 12/20/2001 Document Revised: 03/10/2014 Document Reviewed: 03/21/2011 [facility] Patient Information 2015 [place], [place]. This information is not intended to replace advice given to you by your health care provider. Make sure you discuss any questions you have with your health care provider.

## 2015-06-18 DIAGNOSIS — Z0000 Encounter for general adult medical examination without abnormal findings: Secondary | ICD-10-CM | POA: Insufficient documentation

## 2015-06-18 DIAGNOSIS — Z23 Encounter for immunization: Secondary | ICD-10-CM | POA: Insufficient documentation

## 2015-06-18 NOTE — Assessment & Plan Note (Signed)
TDaP given by nursing staff. 

## 2015-06-18 NOTE — Assessment & Plan Note (Signed)
Sertraline discontinued as patient is hesitant to take due to side effect of lexapro. Is handling anxiety much better on her own presently. Will continue Valium but will increase to 10 mg BID as needed. Follow-up 6 months.

## 2015-06-18 NOTE — Progress Notes (Signed)
Patient presents to clinic today for annual exam.  Patient is fasting for labs.  Chronic Issues: Hypothyroidism -- patient endorses taking her levothyroxine 50 mcg tablet daily as directed. Endorses improvement in energy. Is due for repeat TSH to assess efficacy of current dose in normalizing TSH.  Migraines -- patient with history of chronic migraine. Endorses headaches every few weeks. Was previously controlled with Imitrex but patient notes medications are now sub-therapeutic.  Health Maintenance: Dental -- up-to-date Vision -- up-to-date Immunizations -- TDaP due. Mammogram -- Followed by OB/GYN Toney Rakes). Has mammogram scheduled. PAP -- Followed by OB/GYN. Endorses up-to-date   Past Medical History  Diagnosis Date  . Migraine   . Chronic constipation   . Vitamin D deficiency     Past Surgical History  Procedure Laterality Date  . Nose surgery      Current Outpatient Prescriptions on File Prior to Visit  Medication Sig Dispense Refill  . cetirizine (ZYRTEC) 10 MG tablet Take 10 mg by mouth daily.    Marland Kitchen levothyroxine (SYNTHROID, LEVOTHROID) 50 MCG tablet Take 1 tablet (50 mcg total) by mouth daily. 30 tablet 1  . Vitamin D, Ergocalciferol, (DRISDOL) 50000 UNITS CAPS capsule Take 1 capsule (50,000 Units total) by mouth every 7 (seven) days. 12 capsule 0   No current facility-administered medications on file prior to visit.    Allergies  Allergen Reactions  . Lexapro [Escitalopram] Nausea Only  . Motrin [Ibuprofen]     itching    Family History  Problem Relation Age of Onset  . Urolithiasis Father   . Cancer Mother   . Hypertension Mother   . Breast cancer Mother 76  . Breast cancer Maternal Grandmother     dx under 70  . Stomach cancer Paternal Grandmother     dx in her 82s  . Alcohol abuse Maternal Uncle   . Prostate cancer Paternal Uncle     mid 51s    Social History   Social History  . Marital Status: Married    Spouse Name: N/A  . Number of  Children: 0  . Years of Education: N/A   Occupational History  . Not on file.   Social History Main Topics  . Smoking status: Never Smoker   . Smokeless tobacco: Never Used  . Alcohol Use: 0.0 oz/week    0 Standard drinks or equivalent per week     Comment: occasional  . Drug Use: No  . Sexual Activity: Yes   Other Topics Concern  . Not on file   Social History Narrative   Review of Systems  Constitutional: Negative for fever and weight loss.  HENT: Negative for ear discharge, ear pain, hearing loss and tinnitus.   Eyes: Negative for blurred vision, double vision, photophobia and pain.  Respiratory: Negative for cough and shortness of breath.   Cardiovascular: Negative for chest pain and palpitations.  Gastrointestinal: Negative for heartburn, nausea, vomiting, abdominal pain, diarrhea, constipation, blood in stool and melena.  Genitourinary: Negative for dysuria, urgency, frequency, hematuria and flank pain.  Musculoskeletal: Negative for falls.  Neurological: Positive for headaches. Negative for dizziness and loss of consciousness.  Endo/Heme/Allergies: Negative for environmental allergies.  Psychiatric/Behavioral: Negative for depression, suicidal ideas, hallucinations and substance abuse. The patient is not nervous/anxious and does not have insomnia.     BP 98/50 mmHg  Pulse 72  Temp(Src) 98 F (36.7 C) (Oral)  Ht 5' 0.25" (1.53 m)  Wt 137 lb 12.8 oz (62.506 kg)  BMI 26.70 kg/m2  SpO2 99%  LMP 06/13/2015  Physical Exam  Constitutional: She is oriented to person, place, and time and well-developed, well-nourished, and in no distress.  HENT:  Head: Normocephalic and atraumatic.  Right Ear: Tympanic membrane, external ear and ear canal normal.  Left Ear: Tympanic membrane, external ear and ear canal normal.  Nose: Nose normal. No mucosal edema.  Mouth/Throat: Uvula is midline, oropharynx is clear and moist and mucous membranes are normal. No oropharyngeal exudate  or posterior oropharyngeal erythema.  Eyes: Conjunctivae are normal. Pupils are equal, round, and reactive to light.  Neck: Neck supple. No thyromegaly present.  Cardiovascular: Normal rate, regular rhythm, normal heart sounds and intact distal pulses.   Pulmonary/Chest: Effort normal and breath sounds normal. No respiratory distress. She has no wheezes. She has no rales.  Abdominal: Soft. Bowel sounds are normal. She exhibits no distension and no mass. There is no tenderness. There is no rebound and no guarding.  Lymphadenopathy:    She has no cervical adenopathy.  Neurological: She is alert and oriented to person, place, and time. No cranial nerve deficit.  Skin: Skin is warm and dry. No rash noted.  Psychiatric: Affect normal.  Vitals reviewed.   Recent Results (from the past 2160 hour(s))  TSH     Status: Abnormal   Collection Time: 05/05/15  8:46 AM  Result Value Ref Range   TSH 7.86 (H) 0.35 - 4.50 uIU/mL  CBC with Differential/Platelet     Status: None   Collection Time: 06/04/15  9:17 AM  Result Value Ref Range   WBC 4.9 4.0 - 10.5 K/uL   RBC 4.33 3.87 - 5.11 MIL/uL   Hemoglobin 13.6 12.0 - 15.0 g/dL   HCT 39.9 36.0 - 46.0 %   MCV 92.1 78.0 - 100.0 fL   MCH 31.4 26.0 - 34.0 pg   MCHC 34.1 30.0 - 36.0 g/dL   RDW 13.0 11.5 - 15.5 %   Platelets 250 150 - 400 K/uL   MPV 11.4 8.6 - 12.4 fL   Neutrophils Relative % 58 43 - 77 %   Neutro Abs 2.8 1.7 - 7.7 K/uL   Lymphocytes Relative 34 12 - 46 %   Lymphs Abs 1.7 0.7 - 4.0 K/uL   Monocytes Relative 7 3 - 12 %   Monocytes Absolute 0.3 0.1 - 1.0 K/uL   Eosinophils Relative 1 0 - 5 %   Eosinophils Absolute 0.0 0.0 - 0.7 K/uL   Basophils Relative 0 0 - 1 %   Basophils Absolute 0.0 0.0 - 0.1 K/uL   Smear Review Criteria for review not met   Comprehensive metabolic panel     Status: Abnormal   Collection Time: 06/04/15  9:17 AM  Result Value Ref Range   Sodium 139 135 - 146 mEq/L   Potassium 4.5 3.5 - 5.3 mEq/L   Chloride  103 98 - 110 mEq/L   CO2 27 20 - 31 mEq/L   Glucose, Bld 93 65 - 99 mg/dL   BUN 10 7 - 25 mg/dL   Creat 0.84 0.50 - 1.10 mg/dL   Total Bilirubin 1.3 (H) 0.2 - 1.2 mg/dL   Alkaline Phosphatase 56 33 - 115 U/L   AST 18 10 - 30 U/L   ALT 13 6 - 29 U/L   Total Protein 7.0 6.1 - 8.1 g/dL   Albumin 4.5 3.6 - 5.1 g/dL   Calcium 9.5 8.6 - 10.2 mg/dL  Lipid panel     Status: None  Collection Time: 06/04/15  9:17 AM  Result Value Ref Range   Cholesterol 177 125 - 200 mg/dL    Comment: ATP III Classification:       < 200        mg/dL        Desirable      200 - 239     mg/dL        Borderline High      >= 240        mg/dL        High      Triglycerides 75 <150 mg/dL   HDL 63 >=46 mg/dL   Total CHOL/HDL Ratio 2.8 <=5.0 Ratio   VLDL 15 <30 mg/dL   LDL Cholesterol 99 <130 mg/dL    Comment:   Total Cholesterol/HDL Ratio:CHD Risk                        Coronary Heart Disease Risk Table                                        Men       Women          1/2 Average Risk              3.4        3.3              Average Risk              5.0        4.4           2X Average Risk              9.6        7.1           3X Average Risk             23.4       11.0 Use the calculated Patient Ratio above and the CHD Risk table  to determine the patient's CHD Risk. ATP III Classification (LDL):       < 100        mg/dL         Optimal      100 - 129     mg/dL         Near or Above Optimal      130 - 159     mg/dL         Borderline High      160 - 189     mg/dL         High       > 190        mg/dL         Very High     Vit D  25 hydroxy (rtn osteoporosis monitoring)     Status: Abnormal   Collection Time: 06/04/15  9:17 AM  Result Value Ref Range   Vit D, 25-Hydroxy 20 (L) 30 - 100 ng/mL    Comment: Vitamin D Status           25-OH Vitamin D        Deficiency                <20 ng/mL        Insufficiency  20 - 29 ng/mL        Optimal             > or = 30 ng/mL   For 25-OH Vitamin D  testing on patients on D2-supplementation and patients for whom quantitation of D2 and D3 fractions is required, the QuestAssureD 25-OH VIT D, (D2,D3), LC/MS/MS is recommended: order code [phone] (patients > 2 yrs).   Footnotes:  (1) ** Please note change in unit of measure and reference range(s). **     Urinalysis w microscopic + reflex cultur     Status: Abnormal   Collection Time: 06/04/15  9:18 AM  Result Value Ref Range   Color, Urine YELLOW YELLOW    Comment: ** Please note change in unit of measure and reference range(s). **      APPearance CLEAR CLEAR   Specific Gravity, Urine 1.016 1.001 - 1.035   pH 6.5 5.0 - 8.0   Glucose, UA NEGATIVE NEGATIVE   Bilirubin Urine NEGATIVE NEGATIVE   Ketones, ur NEGATIVE NEGATIVE   Hgb urine dipstick NEGATIVE NEGATIVE   Protein, ur NEGATIVE NEGATIVE   Nitrite NEGATIVE NEGATIVE   Leukocytes, UA NEGATIVE NEGATIVE   WBC, UA 0-5 <=5 WBC/HPF   RBC / HPF NONE SEEN <=2 RBC/HPF   Squamous Epithelial / LPF 6-10 (A) <=5 HPF   Bacteria, UA NONE SEEN NONE SEEN HPF   Crystals NONE SEEN NONE SEEN HPF   Casts NONE SEEN NONE SEEN LPF   Yeast NONE SEEN NONE SEEN HPF  TSH     Status: Abnormal   Collection Time: 06/17/15  8:18 AM  Result Value Ref Range   TSH 4.56 (H) 0.35 - 4.50 uIU/mL    Assessment/Plan: Need for diphtheria-tetanus-pertussis (Tdap) vaccine, adult/adolescent TDaP given by nursing staff.  Visit for preventive health examination Depression screen negative. Mammogram scheduled. PAP up-to-date. Preventive schedule discussed and handout given in AVS.  Will obtain fasting labs today.  Generalized anxiety disorder Sertraline discontinued as patient is hesitant to take due to side effect of lexapro. Is handling anxiety much better on her own presently. Will continue Valium but will increase to 10 mg BID as needed. Follow-up 6 months.  Hypothyroidism Will obtain repeat TSH today.

## 2015-06-18 NOTE — Assessment & Plan Note (Signed)
Depression screen negative. Mammogram scheduled. PAP up-to-date. Preventive schedule discussed and handout given in AVS.  Will obtain fasting labs today.

## 2015-06-18 NOTE — Assessment & Plan Note (Signed)
Will obtain repeat TSH today. 

## 2015-06-22 ENCOUNTER — Ambulatory Visit
Admission: RE | Admit: 2015-06-22 | Discharge: 2015-06-22 | Disposition: A | Discharge status: Home / Self Care | Payer: Federal, State, Local not specified - PPO | Source: Ambulatory Visit | Attending: Gynecology | Admitting: Gynecology

## 2015-06-22 DIAGNOSIS — N6320 Unspecified lump in the left breast, unspecified quadrant: Secondary | ICD-10-CM

## 2015-07-21 ENCOUNTER — Telehealth: Payer: Self-pay | Admitting: Physician Assistant

## 2015-07-21 NOTE — Telephone Encounter (Signed)
She will need an appointment for evaluation.

## 2015-07-21 NOTE — Telephone Encounter (Signed)
Scheduled pt for 07/22/15 9:15am

## 2015-07-21 NOTE — Telephone Encounter (Signed)
Caller name: [name] Dresden   Relationship to patient: self  Can be reached: [phone] Pharmacy:  Reason for call: pt says that she need a Rx for Flexeril for her sciatic nerve pain.

## 2015-07-22 ENCOUNTER — Ambulatory Visit: Payer: Federal, State, Local not specified - PPO | Admitting: Physician Assistant

## 2015-07-22 ENCOUNTER — Other Ambulatory Visit: Payer: Self-pay | Admitting: Physician Assistant

## 2015-07-22 ENCOUNTER — Telehealth: Payer: Self-pay | Admitting: Physician Assistant

## 2015-07-22 ENCOUNTER — Ambulatory Visit (INDEPENDENT_AMBULATORY_CARE_PROVIDER_SITE_OTHER): Payer: Federal, State, Local not specified - PPO | Admitting: Physician Assistant

## 2015-07-22 ENCOUNTER — Encounter: Payer: Self-pay | Admitting: Physician Assistant

## 2015-07-22 VITALS — BP 98/62 | HR 72 | Temp 97.8°F | Resp 16 | Ht 60.25 in | Wt 140.1 lb

## 2015-07-22 DIAGNOSIS — M5431 Sciatica, right side: Secondary | ICD-10-CM | POA: Diagnosis not present

## 2015-07-22 MED ORDER — CYCLOBENZAPRINE HCL 10 MG PO TABS: 10.0000 mg | ORAL_TABLET | Freq: Every day | ORAL | Status: DC

## 2015-07-22 MED ORDER — METHYLPREDNISOLONE 4 MG PO TBPK: ORAL_TABLET | ORAL | Status: DC

## 2015-07-22 MED ORDER — HYDROCODONE-ACETAMINOPHEN 10-325 MG PO TABS
1.0000 | ORAL_TABLET | Freq: Three times a day (TID) | ORAL | Status: DC | PRN
Start: 2015-07-22 — End: 2016-02-17
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
## 2015-07-22 NOTE — Assessment & Plan Note (Signed)
Rx medrol dose pack. Norco for severe pain. Flexeril at bedtime. Limit heavy lifting. Rest. Follow sciatica exercises given previously. Imaging will be needed if symptoms not quickly resolving.

## 2015-07-22 NOTE — Telephone Encounter (Signed)
Refill sent per [facility] refill protocol/SLS  

## 2015-07-22 NOTE — Patient Instructions (Signed)
Please take Medrol dose pack as directed. Take Flexeril at bedtime. Use Norco if needed for severe pain. Limit heavy lifting or overexertion. Restart the stretching exercises as symptoms begin to remove.  If symptoms are not resolving we will need repeat imaging.

## 2015-08-05 NOTE — Telephone Encounter (Signed)
No charge this time. 

## 2015-08-05 NOTE — Telephone Encounter (Signed)
Pt was no show 07/22/15 9:15am, acute appt, pt came in same day at 11:0am, charge for no show?

## 2015-08-25 ENCOUNTER — Other Ambulatory Visit: Payer: Self-pay | Admitting: Physician Assistant

## 2015-09-11 ENCOUNTER — Other Ambulatory Visit: Payer: Federal, State, Local not specified - PPO

## 2015-09-11 DIAGNOSIS — E559 Vitamin D deficiency, unspecified: Secondary | ICD-10-CM

## 2015-09-12 LAB — VITAMIN D 25 HYDROXY (VIT D DEFICIENCY, FRACTURES): Vit D, 25-Hydroxy: 33 ng/mL (ref 30–100)

## 2015-09-21 ENCOUNTER — Telehealth: Payer: Self-pay | Admitting: Physician Assistant

## 2015-09-21 ENCOUNTER — Other Ambulatory Visit (INDEPENDENT_AMBULATORY_CARE_PROVIDER_SITE_OTHER): Payer: Federal, State, Local not specified - PPO

## 2015-09-21 DIAGNOSIS — E039 Hypothyroidism, unspecified: Secondary | ICD-10-CM | POA: Diagnosis not present

## 2015-09-21 LAB — TSH: TSH: 3.21 u[IU]/mL (ref 0.35–4.50)

## 2015-09-21 NOTE — Telephone Encounter (Signed)
error:[id number] ° °

## 2015-09-22 ENCOUNTER — Other Ambulatory Visit: Payer: Self-pay | Admitting: Physician Assistant

## 2015-09-22 NOTE — Telephone Encounter (Signed)
Rx request faxed to pharmacy/SLS  

## 2015-09-23 ENCOUNTER — Other Ambulatory Visit: Payer: Self-pay | Admitting: Physician Assistant

## 2015-10-22 ENCOUNTER — Other Ambulatory Visit: Payer: Self-pay | Admitting: Physician Assistant

## 2015-12-25 ENCOUNTER — Other Ambulatory Visit: Payer: Self-pay | Admitting: Physician Assistant

## 2015-12-30 ENCOUNTER — Encounter: Payer: Self-pay | Admitting: Gynecology

## 2015-12-30 ENCOUNTER — Ambulatory Visit (INDEPENDENT_AMBULATORY_CARE_PROVIDER_SITE_OTHER): Payer: Federal, State, Local not specified - PPO | Admitting: Gynecology

## 2015-12-30 VITALS — BP 124/78

## 2015-12-30 DIAGNOSIS — N898 Other specified noninflammatory disorders of vagina: Secondary | ICD-10-CM

## 2015-12-30 DIAGNOSIS — Z113 Encounter for screening for infections with a predominantly sexual mode of transmission: Secondary | ICD-10-CM

## 2015-12-30 DIAGNOSIS — N3000 Acute cystitis without hematuria: Secondary | ICD-10-CM | POA: Diagnosis not present

## 2015-12-30 DIAGNOSIS — R300 Dysuria: Secondary | ICD-10-CM | POA: Diagnosis not present

## 2015-12-30 LAB — WET PREP FOR TRICH, YEAST, CLUE
Clue Cells Wet Prep HPF POC: NONE SEEN
Trich, Wet Prep: NONE SEEN
Yeast Wet Prep HPF POC: NONE SEEN

## 2015-12-30 LAB — URINALYSIS W MICROSCOPIC + REFLEX CULTURE
Bilirubin Urine: NEGATIVE
Casts: NONE SEEN [LPF]
Crystals: NONE SEEN [HPF]
Glucose, UA: NEGATIVE
Hgb urine dipstick: NEGATIVE
Leukocytes, UA: NEGATIVE
Nitrite: NEGATIVE
Protein, ur: NEGATIVE
Specific Gravity, Urine: 1.015 (ref 1.001–1.035)
Yeast: NONE SEEN [HPF]
pH: 6 (ref 5.0–8.0)

## 2015-12-30 MED ORDER — PHENAZOPYRIDINE HCL 200 MG PO TABS: 200.0000 mg | ORAL_TABLET | Freq: Three times a day (TID) | ORAL | Status: DC | PRN

## 2015-12-30 MED ORDER — NITROFURANTOIN MONOHYD MACRO 100 MG PO CAPS: 100.0000 mg | ORAL_CAPSULE | Freq: Two times a day (BID) | ORAL | Status: DC

## 2015-12-30 MED ORDER — FLUCONAZOLE 150 MG PO TABS: ORAL_TABLET | ORAL | Status: DC

## 2015-12-30 NOTE — Progress Notes (Signed)
   Patient is a 40 year old who presented to the office today complaining of several days of pressure 1 voiding some mild dysuria she had taken some preterm she had at home which helped with some of her symptoms. She is also complaining some vulvar irritation and pruritus and thought she had a white discharge but no odor recently. She isn't an monogamous relationship. Her husband has had a vasectomy. She reports normal menstrual cycles. Patient denies any back pain, chills, nausea, vomiting.  Exam: Back: No CVA tenderness Abdomen: Soft nontender no rebound or guarding Pelvic: Bartholin urethra Skene glands within normal limits Vagina: No discharge noted Cervix: No lesions or discharge Bimanual exam not done Adnexa: Not examined Rectal exam: Not examined  Wet prep all a few bacteria rare WBC  Urinalysis: 0-WBC, 0-2 RBC, many bacteria  Assessment/plan: Based on patient's symptoms very likely she has an early urinary tract infection for this reason as we wait for the urine culture she's going to be prescribed Macrobid one by mouth twice a day for 7 days. She'll also be prescribed Pyridium 200 mg take 1 by mouth 3 times a day for 3 days. She will be encouraged to increase her fluid intake. Also in the event that she does have a yeast infection which was not sampled from her external genitalia I have given her prescription Diflucan 150 mg 1 by mouth today. A GC and Chlamydia culture was obtained today result pending at time of this dictation as well.

## 2015-12-30 NOTE — Patient Instructions (Addendum)
Phenazopyridine tablets  What is this medicine?  PHENAZOPYRIDINE (fen az oh PEER i deen) is a pain reliever. It is used to stop the pain, burning, or discomfort caused by infection or irritation of the urinary tract. This medicine is not an antibiotic. It will not cure a urinary tract infection.  This medicine may be used for other purposes; ask your health care provider or pharmacist if you have questions.  What should I tell my health care provider before I take this medicine?  They need to know if you have any of these conditions:  -glucose-6-phosphate dehydrogenase (G6PD) deficiency  -kidney disease  -an unusual or allergic reaction to phenazopyridine, other medicines, foods, dyes, or preservatives  -pregnant or trying to get pregnant  -breast-feeding  How should I use this medicine?  Take this medicine by mouth with a glass of water. Follow the directions on the prescription label. Take after meals. Take your doses at regular intervals. Do not take your medicine more often than directed. Do not skip doses or stop your medicine early even if you feel better. Do not stop taking except on your doctor's advice.  Talk to your pediatrician regarding the use of this medicine in children. Special care may be needed.  Overdosage: If you think you have taken too much of this medicine contact a poison control center or emergency room at once.  NOTE: This medicine is only for you. Do not share this medicine with others.  What if I miss a dose?  If you miss a dose, take it as soon as you can. If it is almost time for your next dose, take only that dose. Do not take double or extra doses.  What may interact with this medicine?  Interactions are not expected.  This list may not describe all possible interactions. Give your health care provider a list of all the medicines, herbs, non-prescription drugs, or dietary supplements you use. Also tell them if you smoke, drink alcohol, or use illegal drugs. Some items may interact  with your medicine.  What should I watch for while using this medicine?  Tell your doctor or health care professional if your symptoms do not improve or if they get worse.  This medicine colors body fluids red. This effect is harmless and will go away after you are done taking the medicine. It will change urine to an dark orange or red color. The red color may stain clothing. Soft contact lenses may become permanently stained. It is best not to wear soft contact lenses while taking this medicine.  If you are diabetic you may get a false positive result for sugar in your urine. Talk to your health care provider.  What side effects may I notice from receiving this medicine?  Side effects that you should report to your doctor or health care professional as soon as possible:  -allergic reactions like skin rash, itching or hives, swelling of the face, lips, or tongue  -blue or purple color of the skin  -difficulty breathing  -fever  -less urine  -unusual bleeding, bruising  -unusual tired, weak  -vomiting  -yellowing of the eyes or skin  Side effects that usually do not require medical attention (report to your doctor or health care professional if they continue or are bothersome):  -dark urine  -headache  -stomach upset  This list may not describe all possible side effects. Call your doctor for medical advice about side effects. You may report side effects to FDA at 1-800-FDA-1088.    expiration date. NOTE: This sheet is a summary. It may not cover all possible information. If you have questions about this medicine, talk to your doctor, pharmacist, or health care provider.    2016, Elsevier/Gold Standard. (2008-05-22 11:04:07) Nitrofurantoin tablets or capsules What is this medicine? NITROFURANTOIN  (nye troe fyoor AN toyn) is an antibiotic. It is used to treat urinary tract infections. This medicine may be used for other purposes; ask your health care provider or pharmacist if you have questions. What should I tell my health care provider before I take this medicine? They need to know if you have any of these conditions: -anemia -diabetes -glucose-6-phosphate dehydrogenase deficiency -kidney disease -liver disease -lung disease -other chronic illness -an unusual or allergic reaction to nitrofurantoin, other antibiotics, other medicines, foods, dyes or preservatives -pregnant or trying to get pregnant -breast-feeding How should I use this medicine? Take this medicine by mouth with a glass of water. Follow the directions on the prescription label. Take this medicine with food or milk. Take your doses at regular intervals. Do not take your medicine more often than directed. Do not stop taking except on your doctor's advice. Talk to your pediatrician regarding the use of this medicine in children. While this drug may be prescribed for selected conditions, precautions do apply. Overdosage: If you think you have taken too much of this medicine contact a poison control center or emergency room at once. NOTE: This medicine is only for you. Do not share this medicine with others. What if I miss a dose? If you miss a dose, take it as soon as you can. If it is almost time for your next dose, take only that dose. Do not take double or extra doses. What may interact with this medicine? -antacids containing magnesium trisilicate -probenecid -quinolone antibiotics like ciprofloxacin, lomefloxacin, norfloxacin and ofloxacin -sulfinpyrazone This list may not describe all possible interactions. Give your health care provider a list of all the medicines, herbs, non-prescription drugs, or dietary supplements you use. Also tell them if you smoke, drink alcohol, or use illegal drugs. Some items may  interact with your medicine. What should I watch for while using this medicine? Tell your doctor or health care professional if your symptoms do not improve or if you get new symptoms. Drink several glasses of water a day. If you are taking this medicine for a long time, visit your doctor for regular checks on your progress. If you are diabetic, you may get a false positive result for sugar in your urine with certain brands of urine tests. Check with your doctor. What side effects may I notice from receiving this medicine? Side effects that you should report to your doctor or health care professional as soon as possible: -allergic reactions like skin rash or hives, swelling of the face, lips, or tongue -chest pain -cough -difficulty breathing -dizziness, drowsiness -fever or infection -joint aches or pains -pale or blue-tinted skin -redness, blistering, peeling or loosening of the skin, including inside the mouth -tingling, burning, pain, or numbness in hands or feet -unusual bleeding or bruising -unusually weak or tired -yellowing of eyes or skin Side effects that usually do not require medical attention (report to your doctor or health care professional if they continue or are bothersome): -dark urine -diarrhea -headache -loss of appetite -nausea or vomiting -temporary hair loss This list may not describe all possible side effects. Call your doctor for medical advice about side effects. You may report side effects to FDA at 1-800-FDA-1088. Where  should I keep my medicine? Keep out of the reach of children. Store at room temperature between 15 and 30 degrees C (59 and 86 degrees F). Protect from light. Throw away any unused medicine after the expiration date. NOTE: This sheet is a summary. It may not cover all possible information. If you have questions about this medicine, talk to your doctor, pharmacist, or health care provider.    2016, Elsevier/Gold Standard. (2008-05-14  15:56:47) Monilial Vaginitis Vaginitis in a soreness, swelling and redness (inflammation) of the vagina and vulva. Monilial vaginitis is not a sexually transmitted infection. CAUSES  Yeast vaginitis is caused by yeast (candida) that is normally found in your vagina. With a yeast infection, the candida has overgrown in number to a point that upsets the chemical balance. SYMPTOMS   White, thick vaginal discharge.  Swelling, itching, redness and irritation of the vagina and possibly the lips of the vagina (vulva).  Burning or painful urination.  Painful intercourse. DIAGNOSIS  Things that may contribute to monilial vaginitis are:  Postmenopausal and virginal states.  Pregnancy.  Infections.  Being tired, sick or stressed, especially if you had monilial vaginitis in the past.  Diabetes. Good control will help lower the chance.  Birth control pills.  Tight fitting garments.  Using bubble bath, feminine sprays, douches or deodorant tampons.  Taking certain medications that kill germs (antibiotics).  Sporadic recurrence can occur if you become ill. TREATMENT  Your caregiver will give you medication.  There are several kinds of anti monilial vaginal creams and suppositories specific for monilial vaginitis. For recurrent yeast infections, use a suppository or cream in the vagina 2 times a week, or as directed.  Anti-monilial or steroid cream for the itching or irritation of the vulva may also be used. Get your caregiver's permission.  Painting the vagina with methylene blue solution may help if the monilial cream does not work.  Eating yogurt may help prevent monilial vaginitis. HOME CARE INSTRUCTIONS   Finish all medication as prescribed.  Do not have sex until treatment is completed or after your caregiver tells you it is okay.  Take warm sitz baths.  Do not douche.  Do not use tampons, especially scented ones.  Wear cotton underwear.  Avoid tight pants and panty  hose.  Tell your sexual partner that you have a yeast infection. They should go to their caregiver if they have symptoms such as mild rash or itching.  Your sexual partner should be treated as well if your infection is difficult to eliminate.  Practice safer sex. Use condoms.  Some vaginal medications cause latex condoms to fail. Vaginal medications that harm condoms are:  Cleocin cream.  Butoconazole (Femstat).  Terconazole (Terazol) vaginal suppository.  Miconazole (Monistat) (may be purchased over the counter). SEEK MEDICAL CARE IF:   You have a temperature by mouth above 102 F (38.9 C).  The infection is getting worse after 2 days of treatment.  The infection is not getting better after 3 days of treatment.  You develop blisters in or around your vagina.  You develop vaginal bleeding, and it is not your menstrual period.  You have pain when you urinate.  You develop intestinal problems.  You have pain with sexual intercourse.   This information is not intended to replace advice given to you by your health care provider. Make sure you discuss any questions you have with your health care provider.   Document Released: 08/03/2005 Document Revised: 01/16/2012 Document Reviewed: 04/27/2015 Elsevier Interactive Patient Education  2016 [employer] Inc. Urinary Tract Infection Urinary tract infections (UTIs) can develop anywhere along your urinary tract. Your urinary tract is your body's drainage system for removing wastes and extra water. Your urinary tract includes two kidneys, two ureters, a bladder, and a urethra. Your kidneys are a pair of bean-shaped organs. Each kidney is about the size of your fist. They are located below your ribs, one on each side of your spine. CAUSES Infections are caused by microbes, which are microscopic organisms, including fungi, viruses, and bacteria. These organisms are so small that they can only be seen through a microscope. Bacteria are  the microbes that most commonly cause UTIs. SYMPTOMS  Symptoms of UTIs may vary by age and gender of the patient and by the location of the infection. Symptoms in young women typically include a frequent and intense urge to urinate and a painful, burning feeling in the bladder or urethra during urination. Older women and men are more likely to be tired, shaky, and weak and have muscle aches and abdominal pain. A fever may mean the infection is in your kidneys. Other symptoms of a kidney infection include pain in your back or sides below the ribs, nausea, and vomiting. DIAGNOSIS To diagnose a UTI, your caregiver will ask you about your symptoms. Your caregiver will also ask you to provide a urine sample. The urine sample will be tested for bacteria and white blood cells. White blood cells are made by your body to help fight infection. TREATMENT  Typically, UTIs can be treated with medication. Because most UTIs are caused by a bacterial infection, they usually can be treated with the use of antibiotics. The choice of antibiotic and length of treatment depend on your symptoms and the type of bacteria causing your infection. HOME CARE INSTRUCTIONS  If you were prescribed antibiotics, take them exactly as your caregiver instructs you. Finish the medication even if you feel better after you have only taken some of the medication.  Drink enough water and fluids to keep your urine clear or pale yellow.  Avoid caffeine, tea, and carbonated beverages. They tend to irritate your bladder.  Empty your bladder often. Avoid holding urine for long periods of time.  Empty your bladder before and after sexual intercourse.  After a bowel movement, women should cleanse from front to back. Use each tissue only once. SEEK MEDICAL CARE IF:   You have back pain.  You develop a fever.  Your symptoms do not begin to resolve within 3 days. SEEK IMMEDIATE MEDICAL CARE IF:   You have severe back pain or lower  abdominal pain.  You develop chills.  You have nausea or vomiting.  You have continued burning or discomfort with urination. MAKE SURE YOU:   Understand these instructions.  Will watch your condition.  Will get help right away if you are not doing well or get worse.   This information is not intended to replace advice given to you by your health care provider. Make sure you discuss any questions you have with your health care provider.   Document Released: 08/03/2005 Document Revised: 07/15/2015 Document Reviewed: 12/02/2011 Elsevier Interactive Patient Education 2016 [employer] Inc.  Fluconazole tablets What is this medicine? FLUCONAZOLE (floo KON na zole) is an antifungal medicine. It is used to treat certain kinds of fungal or yeast infections. This medicine may be used for other purposes; ask your health care provider or pharmacist if you have questions. What should I tell my health care provider before I  take this medicine? They need to know if you have any of these conditions: -electrolyte abnormalities -history of irregular heart beat -kidney disease -an unusual or allergic reaction to fluconazole, other azole antifungals, medicines, foods, dyes, or preservatives -pregnant or trying to get pregnant -breast-feeding How should I use this medicine? Take this medicine by mouth. Follow the directions on the prescription label. Do not take your medicine more often than directed. Talk to your pediatrician regarding the use of this medicine in children. Special care may be needed. This medicine has been used in children as young as 3 months of age. Overdosage: If you think you have taken too much of this medicine contact a poison control center or emergency room at once. NOTE: This medicine is only for you. Do not share this medicine with others. What if I miss a dose? If you miss a dose, take it as soon as you can. If it is almost time for your next dose, take only that dose. Do  not take double or extra doses. What may interact with this medicine? Do not take this medicine with any of the following medications: -astemizole -certain medicines for irregular heart beat like dofetilide, dronedarone, quinidine -cisapride -erythromycin -lomitapide -other medicines that prolong the QT interval (cause an abnormal heart rhythm) -pimozide -terfenadine -thioridazine -tolvaptan -ziprasidone This medicine may also interact with the following medications: -antiviral medicines for HIV or AIDS -birth control pills -certain antibiotics like rifabutin, rifampin -certain medicines for blood pressure like amlodipine, isradipine, felodipine, hydrochlorothiazide, losartan, nifedipine -certain medicines for cancer like cyclophosphamide, vinblastine, vincristine -certain medicines for cholesterol like atorvastatin, lovastatin, fluvastatin, simvastatin -certain medicines for depression, anxiety, or psychotic disturbances like amitriptyline, midazolam, nortriptyline, triazolam -certain medicines for diabetes like glipizide, glyburide, tolbutamide -certain medicines for pain like alfentanil, fentanyl, methadone -certain medicines for seizures like carbamazepine, phenytoin -certain medicines that treat or prevent blood clots like warfarin -halofantrine -medicines that lower your chance of fighting infection like cyclosporine, prednisone, tacrolimus -NSAIDS, medicines for pain and inflammation, like celecoxib, diclofenac, flurbiprofen, ibuprofen, meloxicam, naproxen -other medicines for fungal infections -sirolimus -theophylline -tofacitinib This list may not describe all possible interactions. Give your health care provider a list of all the medicines, herbs, non-prescription drugs, or dietary supplements you use. Also tell them if you smoke, drink alcohol, or use illegal drugs. Some items may interact with your medicine. What should I watch for while using this medicine? Visit your  doctor or health care professional for regular checkups. If you are taking this medicine for a long time you may need blood work. Tell your doctor if your symptoms do not improve. Some fungal infections need many weeks or months of treatment to cure. Alcohol can increase possible damage to your liver. Avoid alcoholic drinks. If you have a vaginal infection, do not have sex until you have finished your treatment. You can wear a sanitary napkin. Do not use tampons. Wear freshly washed cotton, not synthetic, panties. What side effects may I notice from receiving this medicine? Side effects that you should report to your doctor or health care professional as soon as possible: -allergic reactions like skin rash or itching, hives, swelling of the lips, mouth, tongue, or throat -dark urine -feeling dizzy or faint -irregular heartbeat or chest pain -redness, blistering, peeling or loosening of the skin, including inside the mouth -trouble breathing -unusual bruising or bleeding -vomiting -yellowing of the eyes or skin Side effects that usually do not require medical attention (report to your doctor or health care professional  if they continue or are bothersome): -changes in how food tastes -diarrhea -headache -stomach upset or nausea This list may not describe all possible side effects. Call your doctor for medical advice about side effects. You may report side effects to FDA at 1-800-FDA-1088. Where should I keep my medicine? Keep out of the reach of children. Store at room temperature below 30 degrees C (86 degrees F). Throw away any medicine after the expiration date. NOTE: This sheet is a summary. It may not cover all possible information. If you have questions about this medicine, talk to your doctor, pharmacist, or health care provider.    2016, Elsevier/Gold Standard. (2013-06-01 16:13:04)

## 2015-12-31 LAB — GC/CHLAMYDIA PROBE AMP
CT Probe RNA: NOT DETECTED
GC Probe RNA: NOT DETECTED

## 2016-01-01 LAB — URINE CULTURE: Colony Count: >=100000

## 2016-02-02 ENCOUNTER — Encounter: Payer: Self-pay | Admitting: Family

## 2016-02-02 ENCOUNTER — Ambulatory Visit (INDEPENDENT_AMBULATORY_CARE_PROVIDER_SITE_OTHER): Payer: Federal, State, Local not specified - PPO | Admitting: Family

## 2016-02-02 VITALS — BP 100/76 | HR 88 | Temp 98.9°F | Resp 16 | Ht 60.25 in | Wt 139.4 lb

## 2016-02-02 DIAGNOSIS — R52 Pain, unspecified: Secondary | ICD-10-CM

## 2016-02-02 LAB — POC INFLUENZA A&B (BINAX/QUICKVUE)
Influenza A, POC: NEGATIVE
Influenza B, POC: NEGATIVE

## 2016-02-02 NOTE — Progress Notes (Signed)
Subjective:    Patient ID: Lisa Townsend, female    DOB: 1976/08/23, 40 y.o.   MRN: [id number]  HPI  Lisa Townsend is a 40 yr old female who presents today with c/o body aches and chills x 2 days. Using mucinex.  Reports mild sore throat.  Denies ear pain, cough, vomiting but does report + nausea.  She has not had a fever at home.  Husband was mildly sick last few days with similar symptoms but not severe.   Review of Systems See HPI  Past Medical History  Diagnosis Date  . Migraine   . Chronic constipation   . Vitamin D deficiency     Social History   Social History  . Marital Status: Married    Spouse Name: N/A  . Number of Children: 0  . Years of Education: N/A   Occupational History  . Not on file.   Social History Main Topics  . Smoking status: Never Smoker   . Smokeless tobacco: Never Used  . Alcohol Use: 0.0 oz/week    0 Standard drinks or equivalent per week     Comment: occasional  . Drug Use: No  . Sexual Activity: Yes   Other Topics Concern  . Not on file   Social History Narrative    Past Surgical History  Procedure Laterality Date  . Nose surgery      Family History  Problem Relation Age of Onset  . Urolithiasis Father   . Cancer Mother   . Hypertension Mother   . Breast cancer Mother 1  . Breast cancer Maternal Grandmother     dx under 40  . Stomach cancer Paternal Grandmother     dx in her 57s  . Alcohol abuse Maternal Uncle   . Prostate cancer Paternal Uncle     mid 43s    Allergies  Allergen Reactions  . Lexapro [Escitalopram] Nausea Only  . Motrin [Ibuprofen]     itching    Current Outpatient Prescriptions on File Prior to Visit  Medication Sig Dispense Refill  . cetirizine (ZYRTEC) 10 MG tablet Take 10 mg by mouth daily.    . cyclobenzaprine (FLEXERIL) 10 MG tablet Take 1 tablet (10 mg total) by mouth at bedtime. 30 tablet 0  . diazepam (VALIUM) 10 MG tablet TAKE 1 TABLET BY MOUTH EVERY 12 HOURS AS NEEDED FOR ANXIETY 60 tablet 2   . HYDROcodone-acetaminophen (NORCO) 10-325 MG per tablet Take 1 tablet by mouth every 8 (eight) hours as needed. 30 tablet 0  . levothyroxine (SYNTHROID, LEVOTHROID) 50 MCG tablet TAKE 1 TABLET(50 MCG) BY MOUTH DAILY 30 tablet 5  . ondansetron (ZOFRAN) 4 MG tablet Take 1 tablet (4 mg total) by mouth every 8 (eight) hours as needed for nausea or vomiting. 20 tablet 5  . rizatriptan (MAXALT) 10 MG tablet TAKE 1 TABLET BY MOUTH AS NEEDED FOR MIGRAINE MAY REPEAT IN 2 HOURS IF NEEDED 10 tablet 0  . Vitamin D, Ergocalciferol, (DRISDOL) 50000 UNITS CAPS capsule Take 1 capsule (50,000 Units total) by mouth every 7 (seven) days. 12 capsule 0   No current facility-administered medications on file prior to visit.    BP 100/76 mmHg  Pulse 88  Temp(Src) 98.9 F (37.2 C) (Oral)  Resp 16  Ht 5' 0.25" (1.53 m)  Wt 139 lb 6.4 oz (63.231 kg)  BMI 27.01 kg/m2  SpO2 98%  LMP 01/12/2016       Objective:   Physical Exam  Constitutional: She is oriented  to person, place, and time. She appears well-developed and well-nourished.  HENT:  Head: Normocephalic and atraumatic.  Right Ear: Tympanic membrane and ear canal normal.  Left Ear: Tympanic membrane and ear canal normal.  Mouth/Throat: No oropharyngeal exudate, posterior oropharyngeal edema or posterior oropharyngeal erythema.  Cardiovascular: Normal rate, regular rhythm and normal heart sounds.   No murmur heard. Pulmonary/Chest: Effort normal and breath sounds normal. No respiratory distress. She has no wheezes.  Musculoskeletal: She exhibits no edema.  Neurological: She is alert and oriented to person, place, and time.  Psychiatric: She has a normal mood and affect. Her behavior is normal. Judgment and thought content normal.          Assessment & Plan:  She is requesting a refill on her hydrocodone which she has used x 1 acutely in the past for sciatica. Denies current sciatic pain. Advised pt to follow up with PCP to discuss.   Acute  viral illness- rapid flu is negative.  Advised pt on supportive measures as follows:  Your symptoms are most consistent with a viral illness.  Viral illnesses generally last 7-10 days.  For body aches- you may use ibuprofen (motrin) 400mg  every 6 hours as needed. Drink plenty of fluid, get lots of rest.  Call if symptoms worsen, if you develop fever >101 or if you are not improved in 3-4 days.

## 2016-02-02 NOTE — Patient Instructions (Signed)
Your symptoms are most consistent with a viral illness.  Viral illnesses generally last 7-10 days.  For body aches- you may use ibuprofen (motrin) 400mg  every 6 hours as needed. Drink plenty of fluid, get lots of rest.  Call if symptoms worsen, if you develop fever >101 or if you are not improved in 3-4 days.

## 2016-02-12 ENCOUNTER — Other Ambulatory Visit: Payer: Self-pay | Admitting: Physician Assistant

## 2016-02-17 ENCOUNTER — Encounter: Payer: Self-pay | Admitting: Physician Assistant

## 2016-02-17 ENCOUNTER — Telehealth: Payer: Self-pay | Admitting: *Deleted

## 2016-02-17 ENCOUNTER — Ambulatory Visit (INDEPENDENT_AMBULATORY_CARE_PROVIDER_SITE_OTHER): Payer: Federal, State, Local not specified - PPO | Admitting: Physician Assistant

## 2016-02-17 VITALS — BP 90/58 | HR 93 | Temp 98.1°F | Ht 60.25 in | Wt 135.4 lb

## 2016-02-17 DIAGNOSIS — J309 Allergic rhinitis, unspecified: Secondary | ICD-10-CM

## 2016-02-17 DIAGNOSIS — M5431 Sciatica, right side: Secondary | ICD-10-CM

## 2016-02-17 MED ORDER — HYDROCODONE-ACETAMINOPHEN 10-325 MG PO TABS: 1.0000 | ORAL_TABLET | Freq: Three times a day (TID) | ORAL | Status: DC | PRN

## 2016-02-17 MED ORDER — AZELASTINE-FLUTICASONE 137-50 MCG/ACT NA SUSP: 1.0000 | Freq: Two times a day (BID) | NASAL | Status: DC

## 2016-02-17 MED ORDER — FLUTICASONE PROPIONATE 50 MCG/ACT NA SUSP: 2.0000 | Freq: Every day | NASAL | Status: DC

## 2016-02-17 MED ORDER — CYCLOBENZAPRINE HCL 10 MG PO TABS: 10.0000 mg | ORAL_TABLET | Freq: Every day | ORAL | Status: DC

## 2016-02-17 NOTE — Progress Notes (Signed)
Pre visit review using our clinic review tool, if applicable. No additional management support is needed unless otherwise documented below in the visit note. 

## 2016-02-17 NOTE — Progress Notes (Signed)
Patient presents to clinic today c/o itching throat, sneezing, watery eyes and rhinorrhea. Endorses history of seasonal allergies. Is taking Zyretc and Benadryl with only minimal relief in symptoms.   Past Medical History  Diagnosis Date  . Migraine   . Chronic constipation   . Vitamin D deficiency     Current Outpatient Prescriptions on File Prior to Visit  Medication Sig Dispense Refill  . cetirizine (ZYRTEC) 10 MG tablet Take 10 mg by mouth daily.    . diazepam (VALIUM) 10 MG tablet TAKE 1 TABLET BY MOUTH EVERY 12 HOURS AS NEEDED FOR ANXIETY 60 tablet 2  . levothyroxine (SYNTHROID, LEVOTHROID) 50 MCG tablet TAKE 1 TABLET(50 MCG) BY MOUTH DAILY 30 tablet 5  . ondansetron (ZOFRAN) 4 MG tablet Take 1 tablet (4 mg total) by mouth every 8 (eight) hours as needed for nausea or vomiting. 20 tablet 5  . rizatriptan (MAXALT) 10 MG tablet TAKE 1 TABLET BY MOUTH AS NEEDED FOR MIGRAINE MAY REPEAT IN 2 HOURS IF NEEDED 10 tablet 0   No current facility-administered medications on file prior to visit.    Allergies  Allergen Reactions  . Lexapro [Escitalopram] Nausea Only  . Motrin [Ibuprofen]     itching    Family History  Problem Relation Age of Onset  . Urolithiasis Father   . Cancer Mother   . Hypertension Mother   . Breast cancer Mother 58  . Breast cancer Maternal Grandmother     dx under 65  . Stomach cancer Paternal Grandmother     dx in her 72s  . Alcohol abuse Maternal Uncle   . Prostate cancer Paternal Uncle     mid 92s   Social History   Social History  . Marital Status: Married    Spouse Name: N/A  . Number of Children: 0  . Years of Education: N/A   Social History Main Topics  . Smoking status: Never Smoker   . Smokeless tobacco: Never Used  . Alcohol Use: 0.0 oz/week    0 Standard drinks or equivalent per week     Comment: occasional  . Drug Use: No  . Sexual Activity: Yes   Other Topics Concern  . None   Social History Narrative   Review of  Systems - See HPI.  All other ROS are negative.  BP 90/58 mmHg  Pulse 93  Temp(Src) 98.1 F (36.7 C) (Oral)  Ht 5' 0.25" (1.53 m)  Wt 135 lb 6.4 oz (61.417 kg)  BMI 26.24 kg/m2  SpO2 99%  LMP 02/02/2016  Physical Exam  Constitutional: She is oriented to person, place, and time and well-developed, well-nourished, and in no distress.  HENT:  Head: Normocephalic and atraumatic.  Right Ear: Tympanic membrane normal.  Left Ear: Tympanic membrane normal.  Nose: Rhinorrhea present. No mucosal edema.  Mouth/Throat: Uvula is midline, oropharynx is clear and moist and mucous membranes are normal.  Eyes: Conjunctivae are normal.  Neck: Neck supple.  Cardiovascular: Normal rate, regular rhythm, normal heart sounds and intact distal pulses.   Pulmonary/Chest: Effort normal and breath sounds normal. No respiratory distress. She has no wheezes. She has no rales. She exhibits no tenderness.  Neurological: She is alert and oriented to person, place, and time.  Skin: Skin is warm and dry. No rash noted.  Psychiatric: Affect normal.  Vitals reviewed.   Recent Results (from the past 2160 hour(s))  WET PREP FOR TRICH, YEAST, CLUE     Status: None   Collection Time: 12/30/15  9:48 AM  Result Value Ref Range   Yeast Wet Prep HPF POC NONE SEEN NONE SEEN   Trich, Wet Prep NONE SEEN NONE SEEN   Clue Cells Wet Prep HPF POC NONE SEEN NONE SEEN   WBC, Wet Prep HPF POC RARE NONE SEEN    Comment: FEW BACTERIA SEEN (13-20) EPITH. CELLS PER HPF   Urinalysis with Culture Reflex     Status: Abnormal   Collection Time: 12/30/15  9:49 AM  Result Value Ref Range   Color, Urine DARK YELLOW YELLOW    Comment: ** Please note change in unit of measure and reference range(s). **      APPearance CLOUDY (A) CLEAR   Specific Gravity, Urine 1.015 1.001 - 1.035   pH 6.0 5.0 - 8.0   Glucose, UA NEGATIVE NEGATIVE   Bilirubin Urine NEGATIVE NEGATIVE   Ketones, ur 2+ (A) NEGATIVE   Hgb urine dipstick NEGATIVE  NEGATIVE   Protein, ur NEGATIVE NEGATIVE   Nitrite NEGATIVE NEGATIVE   Leukocytes, UA NEGATIVE NEGATIVE   WBC, UA 0-5 <=5 WBC/HPF   RBC / HPF 0-2 <=2 RBC/HPF   Squamous Epithelial / LPF 20-40 (A) <=5 HPF   Bacteria, UA MANY (A) NONE SEEN HPF   Crystals NONE SEEN NONE SEEN HPF   Casts NONE SEEN NONE SEEN LPF   Yeast NONE SEEN NONE SEEN HPF   Urine-Other SEE NOTE     Comment: *URINE CULTURE PENDING*  Urine culture     Status: None   Collection Time: 12/30/15  9:49 AM  Result Value Ref Range   Colony Count >=100,000 COLONIES/ML    Organism ID, Bacteria GROUP B STREP (S.AGALACTIAE) ISOLATED     Comment: Testing against S. agalactiae not routinely performed due to predictability of AMP/PEN/VAN susceptibility.   GC/Chlamydia Probe Amp     Status: None   Collection Time: 12/30/15  9:56 AM  Result Value Ref Range   CT Probe RNA NOT DETECTED     Comment:                    **Normal Reference Range: NOT DETECTED**   This test was performed using the APTIMA COMBO2 Assay ([employer]   The analytical performance characteristics of this assay, when used to test SurePath specimens have been determined by [facility]      GC Probe RNA NOT DETECTED     Comment:                    **Normal Reference Range: NOT DETECTED**   This test was performed using the APTIMA COMBO2 Assay ([place]   The analytical performance characteristics of this assay, when used to test SurePath specimens have been determined by [facility]     POC Influenza A&B ([facility] test)     Status: None   Collection Time: 02/02/16  2:23 PM  Result Value Ref Range   Influenza A, POC Negative Negative   Influenza B, POC Negative Negative    Assessment/Plan: Rhinitis, allergic Rx Dymista. Zyrtec and saline nasal spray as directed. FU if not resolving.

## 2016-02-17 NOTE — Telephone Encounter (Signed)
Received fax from [facility] that PA was required for medication; attempt to initiate via CMM and would not allow to send to plan, called Insurance plan and was informed that Rx does not [require PA] d/t non-coverage; Formulary alternatives are as follows: Budesonide spray, Flunisolide spray, Fluticasone spray, Triamcinolone spray, Mometasone spray/SLS 04/12 Please Advise.

## 2016-02-17 NOTE — Patient Instructions (Signed)
Please start they Dymista spray daily. Continue Zyrtec in the evenings and add on an over-the-counter Zantac each morning.  Increase fluids. Follow-up in 3-4 weeks.

## 2016-02-17 NOTE — Telephone Encounter (Signed)
Ok to send in [place] -- 2 sprays each nostril daily

## 2016-02-17 NOTE — Telephone Encounter (Signed)
Patient informed, understood & agreed; new Rx to pharmacy, informed if any issues with medication, as always D/C and call office/SLS 04/12

## 2016-02-19 DIAGNOSIS — J3089 Other allergic rhinitis: Secondary | ICD-10-CM | POA: Insufficient documentation

## 2016-02-19 DIAGNOSIS — J309 Allergic rhinitis, unspecified: Secondary | ICD-10-CM | POA: Insufficient documentation

## 2016-02-19 NOTE — Assessment & Plan Note (Signed)
Rx Dymista. Zyrtec and saline nasal spray as directed. FU if not resolving.

## 2016-03-08 ENCOUNTER — Encounter: Payer: Self-pay | Admitting: Gynecology

## 2016-03-08 ENCOUNTER — Ambulatory Visit (INDEPENDENT_AMBULATORY_CARE_PROVIDER_SITE_OTHER): Payer: Federal, State, Local not specified - PPO | Admitting: Gynecology

## 2016-03-08 ENCOUNTER — Telehealth: Payer: Self-pay | Admitting: *Deleted

## 2016-03-08 VITALS — BP 122/80 | Ht 60.0 in | Wt 135.0 lb

## 2016-03-08 DIAGNOSIS — R300 Dysuria: Secondary | ICD-10-CM | POA: Diagnosis not present

## 2016-03-08 DIAGNOSIS — R102 Pelvic and perineal pain: Secondary | ICD-10-CM

## 2016-03-08 DIAGNOSIS — R350 Frequency of micturition: Secondary | ICD-10-CM | POA: Diagnosis not present

## 2016-03-08 DIAGNOSIS — N3001 Acute cystitis with hematuria: Secondary | ICD-10-CM | POA: Diagnosis not present

## 2016-03-08 LAB — URINALYSIS W MICROSCOPIC + REFLEX CULTURE
Casts: NONE SEEN [LPF]
Crystals: NONE SEEN [HPF]
Yeast: NONE SEEN [HPF]

## 2016-03-08 MED ORDER — CEPHALEXIN 250 MG PO CAPS: 250.0000 mg | ORAL_CAPSULE | Freq: Two times a day (BID) | ORAL | Status: DC

## 2016-03-08 MED ORDER — PHENAZOPYRIDINE HCL 200 MG PO TABS: 200.0000 mg | ORAL_TABLET | Freq: Three times a day (TID) | ORAL | Status: DC | PRN

## 2016-03-08 MED ORDER — CEFUROXIME AXETIL 250 MG PO TABS: 250.0000 mg | ORAL_TABLET | Freq: Two times a day (BID) | ORAL | Status: DC

## 2016-03-08 MED ORDER — FLUCONAZOLE 150 MG PO TABS: 150.0000 mg | ORAL_TABLET | Freq: Once | ORAL | Status: DC

## 2016-03-08 NOTE — Telephone Encounter (Signed)
Pt had questions about Rx's that were prescribed at visit today. Unable to leave a message on her voicemail as it was full.

## 2016-03-08 NOTE — Patient Instructions (Addendum)
Infeccin urinaria  (Urinary Tract Infection)  La infeccin urinaria puede ocurrir en Clinical cytogeneticist del tracto urinario. El tracto urinario es un sistema de drenaje del cuerpo por el que se eliminan los desechos y el exceso de [place]. El tracto urinario est formado por dos riones, dos urteres, la vejiga y Geologist, engineering. Los riones son rganos que tienen forma de frijol. Cada rin tiene aproximadamente el tamao del puo. Estn situados debajo de las Antares, uno a cada lado de la columna vertebral CAUSAS  La causa de la infeccin son los microbios, que son organismos microscpicos, que incluyen hongos, virus, y bacterias. Estos organismos son tan pequeos que slo pueden verse a travs del microscopio. Las bacterias son los microorganismos que ms comnmente causan infecciones urinarias.  SNTOMAS  Los sntomas pueden variar segn la edad y el sexo del paciente y por la ubicacin de la infeccin. Los sntomas en las mujeres jvenes incluyen la necesidad frecuente e intensa de orinar y una sensacin dolorosa de ardor en la vejiga o en la uretra durante la miccin. Las mujeres y los hombres mayores podrn sentir cansancio, temblores y debilidad y Arts development officer musculares y Social research officer, government abdominal. Si tiene [place], puede significar que la infeccin est en los riones. Otros sntomas son dolor en la espalda o en los lados debajo de las [place], nuseas y vmitos.  DIAGNSTICO  Para diagnosticar una infeccin urinaria, el mdico le preguntar acerca de sus sntomas. [employer] una [place] de [place]. La muestra de orina se analiza para Hydrographic surveyor bacterias y glbulos blancos de Herbalist. Los glbulos blancos se forman en el organismo para ayudar a Radio broadcast assistant las infecciones.  TRATAMIENTO  Por lo general, las infecciones urinarias pueden tratarse con medicamentos. Debido a que la [employer] de las infecciones son causadas por bacterias, por lo general pueden tratarse con antibiticos. La eleccin del  antibitico y la duracin del tratamiento depender de sus sntomas y el tipo de bacteria causante de la infeccin.  INSTRUCCIONES PARA EL CUIDADO EN EL HOGAR   Si le recetaron antibiticos, tmelos exactamente como su mdico le indique. Termine el medicamento aunque se sienta mejor despus de haber tomado slo algunos.  Beba gran cantidad de lquido para mantener la orina de tono claro o color amarillo plido.  Evite la cafena, el t y las bebidas gaseosas. Estas sustancias irritan la vejiga.  Vaciar la vejiga con frecuencia. Evite retener la orina durante largos perodos.  Vace la vejiga antes y despus de Clinical biochemist.  Despus de mover el intestino, las mujeres deben higienizarse la regin perineal desde adelante hacia atrs. Use slo un papel tissue por vez. SOLICITE ATENCIN MDICA SI:   Siente dolor en la espalda.  Le sube la fiebre.  Los sntomas no mejoran luego de 3 das. SOLICITE ATENCIN MDICA DE INMEDIATO SI:   Siente dolor intenso en la espalda o en la zona inferior del abdomen.  Comienza a sentir escalofros.  Tiene nuseas o vmitos.  Tiene una sensacin continua de quemazn o molestias al [employer]. ASEGRESE DE QUE:   Comprende estas instrucciones.  Controlar su enfermedad.  Solicitar ayuda de inmediato si no mejora o empeora.   Esta informacin no tiene Marine scientist el consejo del mdico. Asegrese de hacerle al mdico cualquier pregunta que tenga.   Document Released: 08/03/2005 Document Revised: 07/18/2012 Elsevier Interactive Patient Education 2016 [place] tablets Qu es este medicamento? La FENAZOPIRIDINA es un analgsico. Genene Churn para Best boy, ardor o molestia causada por  una infeccin o irritacin del tracto urinario. Este medicamento no es un antibitico. No curar una infeccin del tracto urinario. Este medicamento puede ser utilizado para otros usos; si tiene alguna pregunta consulte con  su proveedor de atencin mdica o con su farmacutico. Qu le debo informar a mi profesional de la salud antes de tomar este medicamento? Necesita saber si usted presenta alguno de los siguientes problemas o situaciones: -deficiencia de glucosa-6-fosfato deshidrogenasa (L-6PD) -enfermedad renal -una reaccin alrgica o inusual a la fenazopiridina, a otros medicamentos, alimentos, colorantes o conservantes -si est embarazada o buscando quedar embarazada -si est amamantando a un beb Cmo debo utilizar este medicamento? Tome este medicamento por va oral con un vaso de agua. Siga las instrucciones de la etiqueta del [place]. Tmelo despus de las comidas. Tome sus dosis a intervalos regulares. No tome su medicamento con una frecuencia mayor que la indicada. No omita ninguna dosis o suspenda el uso de su medicamento antes de lo indicado aun si se siente mejor. No deje de tomarlo excepto si as lo indica su mdico. Hable con su pediatra para informarse acerca del uso de este medicamento en nios. Puede requerir atencin especial. Sobredosis: Pngase en contacto inmediatamente con un centro toxicolgico o una sala de urgencia si usted cree que haya tomado demasiado medicamento. ATENCIN: [employer] es solo para usted. No comparta este medicamento con nadie. Qu sucede si me olvido de una dosis? Si olvida una dosis, tmela lo antes posible. Si es casi la hora de la prxima dosis, tome slo esa dosis. No tome dosis adicionales o dobles. Qu puede interactuar con este medicamento? No se esperan interacciones. Puede ser que esta lista no menciona todas las posibles interacciones. Informe a su profesional de KB Home	[place] de [employer] productos a base de hierbas, medicamentos de [place] o suplementos nutritivos que est tomando. Si usted fuma, consume bebidas alcohlicas o si utiliza drogas ilegales, indqueselo tambin a su profesional de KB Home	[place]. Algunas sustancias pueden interactuar con su  medicamento. A qu debo estar atento al usar Coca[employer]Cola? Si los sntomas no mejoran o si empeoran, consulte con su mdico o con su profesional de KB Home	[place]. Este medicamento produce un color rojo en los lquidos corporales. Este efecto es inofensivo y Armed forces operational officer despus de que deje de tomar este medicamento. Este medicamento cambiar el color de su orina a un color naranja oscura o rojo. El color rojo puede manchar la ropa. Los lentes de contacto se pueden Marketing executive. Es preferible no usar lentes de contacto blandos mientras est tomando este medicamento Si es diabtico podr Therapist, music un resultado positivo falso en los anlisis de determinacin del nivel de Location manager en la orina. Consulte a su proveedor de Geophysical data processor. Qu efectos secundarios puedo tener al [employer] este medicamento? Efectos secundarios que debe informar a su mdico o a Barrister's clerk de la salud tan pronto como sea posible: -Chief of Staff como erupcin cutnea, picazn o urticarias, hinchazn de la cara, labios o lengua -color azul o morado de la piel -dificultad al respirar -fiebre -orinar menos -sangrado, magulladuras inusuales -cansancio o debilidad inusual -vmitos -color amarillento de los ojos o la piel Efectos secundarios que, por lo general, no requieren Geophysical data processor (debe informarlos a su mdico o a su profesional de la salud si persisten o si son molestos): -orina de color amarillo oscuro -dolor de cabeza -Higher education careers adviser Puede ser que esta lista no menciona todos los posibles efectos secundarios. Comunquese a su mdico por asesoramiento mdico [employer]  secundarios. Usted puede informar los efectos secundarios a la FDA por telfono al 1-800-FDA-1088. Dnde debo guardar mi medicina? Mantngala fuera del alcance de los nios. Gurdela a [employer], entre 15 y 10 grados C (31 y 90 grados F). Protjala de la luz y de la humedad. Deseche todo el medicamento que no  haya utilizado, despus de la fecha de vencimiento. ATENCIN: Este folleto es un resumen. Puede ser que no cubra toda la posible informacin. Si usted tiene preguntas acerca de esta medicina, consulte con su mdico, su farmacutico o su profesional de Technical sales engineer.    2016, Elsevier/Gold Standard. (2014-12-16 00:00:00) Cefuroxime tablets Qu es este medicamento? La CEFUROXIMA es un antibitico cefalospornico. Se utiliza en el tratamiento de ciertos tipos de infecciones bacterianas. No es efectivo para resfros, gripe u otras infecciones de origen viral. Este medicamento puede ser utilizado para otros usos; si tiene alguna pregunta consulte con su proveedor de atencin mdica o con su farmacutico. Qu le debo informar a mi profesional de la salud antes de tomar este medicamento? Necesita saber si usted presenta alguno de los siguientes problemas o situaciones: -problemas de sangrado -enfermedad intestinal, como colitis -enfermedad renal -enfermedad hepatica -una reaccin alrgica o inusual a la cefuroxima, otros antibiticos o medicamentos, alimentos, colorantes o conservantes -si est embarazada o buscando quedar embarazada -si est amamantando a un beb Cmo debo utilizar este medicamento? Tome este medicamento por va oral con un vaso lleno de agua. Siga las instrucciones de la etiqueta del [place]. No lo triture ni mastique. Este medicamento acta mejor si lo toma con alimentos. Tome sus dosis a intervalos regulares. No tome su medicamento con una frecuencia mayor a la indicada. Complete todo el tratamiento con el medicamento como indicado aun si se siente mejor. No omita ninguna dosis o suspenda el uso de su medicamento antes de lo indicado. Hable con su pediatra para informarse acerca del uso de este medicamento en nios. Puede requerir atencin especial. Aunque este medicamento ha sido recetado a nios tan menores como de 3 meses de edad para condiciones selectivas, las precauciones se  aplican. Sobredosis: Pngase en contacto inmediatamente con un centro toxicolgico o una sala de urgencia si usted cree que haya tomado demasiado medicamento. ATENCIN: [employer] es solo para usted. No comparta este medicamento con nadie. Qu sucede si me olvido de una dosis? Si olvida una dosis, tmela lo antes posible. Si es casi la hora de la prxima dosis, tome slo esa dosis. No tome dosis adicionales o dobles. Qu puede interactuar con este medicamento? Esta medicina puede interactuar con los siguientes medicamentos: -anticidos -pldoras anticonceptivas -ciertos medicamentos para tratar una infeccin, tales como la Disputanta, gentamicina, tobramicina -diurticos -probenecid -warfarina Puede ser que esta lista no menciona todas las posibles interacciones. Informe a su profesional de KB Home	[place] de [employer] productos a base de hierbas, medicamentos de [place] o suplementos nutritivos que est tomando. Si usted fuma, consume bebidas alcohlicas o si utiliza drogas ilegales, indqueselo tambin a su profesional de KB Home	[place]. Algunas sustancias pueden interactuar con su medicamento. A qu debo estar atento al usar Coca-Cola? Si los sntomas no mejoran o si desarrolla sntomas nuevos, informe a su mdico o a su profesional de KB Home	[place]. No trate la diarrea con productos de [employer]. Comunquese con su mdico si tiene diarrea que dura ms de 2 das o si es severa y [place]. Este medicamento puede interferir con algunos anlisis de glucosa en la orina. Consulte a su profesional de KB Home	[place]  si [employer]. Si est recibiendo tratamiento para [place] enfermedad de transmisin sexual, evite todo contacto sexual hasta que haya finalizado el [place]. Su pareja tambin podr Warden/ranger. Qu efectos secundarios puedo tener al [employer] este medicamento? Efectos secundarios que debe informar a su mdico o a Barrister's clerk de la salud tan pronto como sea  posible: -Chief of Staff como erupcin cutnea, picazn o urticarias, hinchazn de la cara, labios o lengua -orina de color oscuro -dificultad al respirar -fiebre -pulso cardiaco irregular o dolor en el pecho -enrojecimiento, formacin de ampollas, descamacin o distensin de la piel, inclusive dentro de la boca -convulsiones -sangrado o magulladuras inusuales -cansancio o debilidad inusual -manchas blancas o llagas dentro de la boca Efectos secundarios que, por lo general, no requieren atencin mdica (debe informarlos a su mdico o a su profesional de la salud si persisten o si son molestos): -diarrea -gas o acidez estomacal -dolor de cabeza -nuseas, vmito -picazn vaginal Puede ser que esta lista no menciona todos los posibles efectos secundarios. Comunquese a su mdico por asesoramiento mdico [employer]. Usted puede informar los efectos secundarios a la FDA por telfono al 1-800-FDA-1088. Dnde debo guardar mi medicina? Mantngala fuera del alcance de los nios. Gurdela a [employer], entre 15 y 11 grados C (40 y 102 grados F). Mantenga el envase bien cerrado. Protjalo de la humedad. Deseche todo el medicamento que no haya utilizado, despus de la fecha de vencimiento. ATENCIN: Este folleto es un resumen. Puede ser que no cubra toda la posible informacin. Si usted tiene preguntas acerca de esta medicina, consulte con su mdico, su farmacutico o su profesional de Technical sales engineer.    2016, Elsevier/Gold Standard. (2014-12-16 00:00:00)

## 2016-03-08 NOTE — Progress Notes (Signed)
   HPI: Patient is a 40 year old who presented to the office today complaining the past several days of dysuria and frequency. She denied any back pain, nausea, vomiting. She had no GI complaints. Patient had some hydrocodone which she took last night because of the pain that she was experiencing. She describes her pain mostly suprapubic no radiation to her back or to her lower extremities. She is currently on her second day of her menstrual cycle.   ROS: A ROS was performed and pertinent positives and negatives are included in the history.  GENERAL: No fevers or chills. HEENT: No change in vision, no earache, sore throat or sinus congestion. NECK: No pain or stiffness. CARDIOVASCULAR: No chest pain or pressure. No palpitations. PULMONARY: No shortness of breath, cough or wheeze. GASTROINTESTINAL: No abdominal pain, nausea, vomiting or diarrhea, melena or bright red blood per rectum. GENITOURINARY: Frequency with dysuria. MUSCULOSKELETAL: No joint or muscle pain, no back pain, no recent trauma. DERMATOLOGIC: No rash, no itching, no lesions. ENDOCRINE: No polyuria, polydipsia, no heat or cold intolerance. No recent change in weight. HEMATOLOGICAL: No anemia or easy bruising or bleeding. NEUROLOGIC: No headache, seizures, numbness, tingling or weakness. PSYCHIATRIC: No depression, no loss of interest in normal activity or change in sleep pattern.   PE: Gen. appearance well-developed well-nourished female with the above mentioned complaint Back: No CVA tenderness Abdomen soft nontender no rebound or guarding. Tenderness was elicited suprapubically. Negative Rovsing, negative obturator negative heel tap sign Pelvic exam not done currently menstruating Rectal exam not done  Urinalysis packed white blood cell, packed red blood cell, many bacteria  Her past urine cultures were evaluated dating back to 2015 and the microorganisms identified at that time or Escherichia coli and group B strep and sensitivity  panel was evaluated to start the patient on appropriate antibiotic.   Assessment Plan: Based on past culture and sensitivity patient's current urinary tract infection will be treated with Ceftin 250 mg she is to take 2 tablets as a loading dose followed by one every 12 hours for 7 days. Her bladder spasms she'll be prescribed Pyridium 200 mg take 1 by mouth 3 times a day for 3 days. Patient states that she develop yeast infections after being on antibiotics her prescription of Diflucan 150 mg to take 1 by mouth was prescribed as well. She was encouraged to increase her fluid intake. If she develops any fever, chills, nausea, vomiting or back pain she'll report to the office immediately or after-hours to the emergency room. Literature information on urinary tract infections and on the antibiotic as above was provided in Spanish and all questions were answered.    Greater than 50% of time was spent in counseling and coordinating care of this patient. Time of consultation: 15   Minutes.

## 2016-03-11 LAB — URINE CULTURE: Colony Count: >=100000

## 2016-03-18 ENCOUNTER — Telehealth: Payer: Self-pay | Admitting: *Deleted

## 2016-03-18 ENCOUNTER — Other Ambulatory Visit: Payer: Self-pay | Admitting: Gynecology

## 2016-03-18 MED ORDER — CIPROFLOXACIN HCL 250 MG PO TABS: 250.0000 mg | ORAL_TABLET | Freq: Two times a day (BID) | ORAL | Status: DC

## 2016-03-18 MED ORDER — PHENAZOPYRIDINE HCL 200 MG PO TABS: 200.0000 mg | ORAL_TABLET | Freq: Three times a day (TID) | ORAL | Status: DC | PRN

## 2016-03-18 NOTE — Telephone Encounter (Signed)
Call in Cipro 250 mg one by mouth twice a day for 3 days. Pyridium 200 mg 3 times a day for 3 more days. Also in the event of a yeast infection: Diflucan 150 mg one by mouth

## 2016-03-18 NOTE — Telephone Encounter (Signed)
Pt aware Rx sent for the below, the pharmacy had sent Rx for diflucan refill this was sent separate.

## 2016-03-18 NOTE — Telephone Encounter (Signed)
Pt was treated on OV 03/08/16 with UTI Rx completed all medications 2 days ago and states today the infection appear to come back. C/o burning with/without urination, vaginal itching as well, pt said she felt slightly better after lose dose of Rx but not 100%, pt asked if Rx could be prescribed due to discomfort? Please advise

## 2016-03-23 ENCOUNTER — Other Ambulatory Visit: Payer: Self-pay | Admitting: Physician Assistant

## 2016-04-06 ENCOUNTER — Other Ambulatory Visit: Payer: Self-pay | Admitting: Physician Assistant

## 2016-04-06 NOTE — Telephone Encounter (Signed)
Requesting Diazepam 10mg -Take 1 tablet by mouth every 12 hours as needed for anxiety. Last refill:09/22/15;#60,2 Last OV:02/17/16-Acute UDS:Not done. Please advise.//AB/CMA

## 2016-04-07 NOTE — Telephone Encounter (Signed)
Relation to PO:[id number] Call back [place]: [facility] [address] - [place], [place] AT [facility] [address] [phone] (Phone) [phone] (Fax)         Reason for call:  Patient checking on the status of medication request below. Patient states she's completley out and cant sleep. Advised patient PCP is out of the office

## 2016-04-08 NOTE — Telephone Encounter (Addendum)
Attempt to reach pt via phone regarding need to p/u Rx this time to sign CSC, no answer; VM states "the mailbox is full and cannot accept messages at this time, Rx and CSC placed at front desk [admin advised/SLS

## 2016-04-14 ENCOUNTER — Other Ambulatory Visit: Payer: Self-pay | Admitting: Physician Assistant

## 2016-04-14 NOTE — Telephone Encounter (Signed)
Advise on refills.  Patient last TSH 06/2015 Last appt. 02/2016

## 2016-06-08 ENCOUNTER — Encounter: Payer: Self-pay | Admitting: Gynecology

## 2016-06-08 ENCOUNTER — Ambulatory Visit (INDEPENDENT_AMBULATORY_CARE_PROVIDER_SITE_OTHER): Payer: Federal, State, Local not specified - PPO | Admitting: Gynecology

## 2016-06-08 VITALS — BP 128/82 | Ht 60.25 in | Wt 140.0 lb

## 2016-06-08 DIAGNOSIS — Z01419 Encounter for gynecological examination (general) (routine) without abnormal findings: Secondary | ICD-10-CM

## 2016-06-08 DIAGNOSIS — Z8639 Personal history of other endocrine, nutritional and metabolic disease: Secondary | ICD-10-CM

## 2016-06-08 LAB — CBC WITH DIFFERENTIAL/PLATELET
Basophils Absolute: 0 cells/uL (ref 0–200)
Basophils Relative: 0 %
Eosinophils Absolute: 135 cells/uL (ref 15–500)
Eosinophils Relative: 3 %
HCT: 36 % (ref 35.0–45.0)
Hemoglobin: 12.2 g/dL (ref 11.7–15.5)
Lymphocytes Relative: 45 %
Lymphs Abs: 2025 cells/uL (ref 850–3900)
MCH: 31 pg (ref 27.0–33.0)
MCHC: 33.9 g/dL (ref 32.0–36.0)
MCV: 91.4 fL (ref 80.0–100.0)
MPV: 10.7 fL (ref 7.5–12.5)
Monocytes Absolute: 495 cells/uL (ref 200–950)
Monocytes Relative: 11 %
Neutro Abs: 1845 cells/uL (ref 1500–7800)
Neutrophils Relative %: 41 %
Platelets: 276 10*3/uL (ref 140–400)
RBC: 3.94 MIL/uL (ref 3.80–5.10)
RDW: 13.1 % (ref 11.0–15.0)
WBC: 4.5 10*3/uL (ref 3.8–10.8)

## 2016-06-08 LAB — COMPREHENSIVE METABOLIC PANEL
ALT: 33 U/L — ABNORMAL HIGH (ref 6–29)
AST: 27 U/L (ref 10–30)
Albumin: 4 g/dL (ref 3.6–5.1)
Alkaline Phosphatase: 66 U/L (ref 33–115)
BUN: 12 mg/dL (ref 7–25)
CO2: 24 mmol/L (ref 20–31)
Calcium: 8.6 mg/dL (ref 8.6–10.2)
Chloride: 101 mmol/L (ref 98–110)
Creat: 0.92 mg/dL (ref 0.50–1.10)
Glucose, Bld: 79 mg/dL (ref 65–99)
Potassium: 3.8 mmol/L (ref 3.5–5.3)
Sodium: 137 mmol/L (ref 135–146)
Total Bilirubin: 0.6 mg/dL (ref 0.2–1.2)
Total Protein: 6.5 g/dL (ref 6.1–8.1)

## 2016-06-08 LAB — TSH: TSH: 6.72 mIU/L — ABNORMAL HIGH

## 2016-06-08 LAB — LIPID PANEL
Cholesterol: 170 mg/dL (ref 125–200)
HDL: 56 mg/dL (ref >=46)
LDL Cholesterol: 85 mg/dL (ref <130)
Total CHOL/HDL Ratio: 3 Ratio (ref <=5.0)
Triglycerides: 144 mg/dL (ref <150)
VLDL: 29 mg/dL (ref <30)

## 2016-06-08 NOTE — Progress Notes (Signed)
  Lisa Townsend 07/04/1976 [id number]   History:    39 y.o.  for annual gyn exam with no major complaints although she complains of tiredness at times. She is a homemaker. She does have history of hypothyroidism has not seen her PCP and is here to have her blood work done today as well as. She does have past history vitamin D deficiency and she states she is taking her vitamin D daily. She is currently on Synthroid for her hypothyroidism. She reports normal menstrual cycles. Patient with strong family history of breast cancer as follows:  Mother breast cancer at age 45  Grandmother breast cancer age 55  Patient had a normal mammogram at age 37.  The patient recently was tested for BRCA1 and BRCA2 mutation was negative Patient in 2015 had a left breast biopsy which demonstrated fibroadenoma. Patient with no past history of any abnormal Pap smears that required any biopsy or intervention.  Past medical history,surgical history, family history and social history were all reviewed and documented in the EPIC chart.  Gynecologic History Patient's last menstrual period was 05/25/2016. Contraception: vasectomy Last Pap: 2015. Results were: normal Last mammogram: 2016. Results were: 's see above  Obstetric History OB History  Gravida Para Term Preterm AB Living  0         0  SAB TAB Ectopic Multiple Live Births                    ROS: A ROS was performed and pertinent positives and negatives are included in the history.  GENERAL: No fevers or chills. HEENT: No change in vision, no earache, sore throat or sinus congestion. NECK: No pain or stiffness. CARDIOVASCULAR: No chest pain or pressure. No palpitations. PULMONARY: No shortness of breath, cough or wheeze. GASTROINTESTINAL: No abdominal pain, nausea, vomiting or diarrhea, melena or bright red blood per rectum. GENITOURINARY: No urinary frequency, urgency, hesitancy or dysuria. MUSCULOSKELETAL: No joint or muscle pain, no back pain, no  recent trauma. DERMATOLOGIC: No rash, no itching, no lesions. ENDOCRINE: No polyuria, polydipsia, no heat or cold intolerance. No recent change in weight. HEMATOLOGICAL: No anemia or easy bruising or bleeding. NEUROLOGIC: No headache, seizures, numbness, tingling or weakness. PSYCHIATRIC: No depression, no loss of interest in normal activity or change in sleep pattern.     Exam: chaperone present  BP 128/82   Ht 5' 0.25" (1.53 m)   Wt 140 lb (63.5 kg)   LMP 05/25/2016   BMI 27.12 kg/m   Body mass index is 27.12 kg/m.  General appearance : Well developed well nourished female. No acute distress HEENT: Eyes: no retinal hemorrhage or exudates,  Neck supple, trachea midline, no carotid bruits, no thyroidmegaly Lungs: Clear to auscultation, no rhonchi or wheezes, or rib retractions  Heart: Regular rate and rhythm, no murmurs or gallops Breast:Examined in sitting and supine position were symmetrical in appearance, no palpable masses or tenderness,  no skin retraction, no nipple inversion, no nipple discharge, no skin discoloration, no axillary or supraclavicular lymphadenopathy Abdomen: no palpable masses or tenderness, no rebound or guarding Extremities: no edema or skin discoloration or tenderness  Pelvic:  Bartholin, Urethra, Skene Glands: Within normal limits             Vagina: No gross lesions or discharge  Cervix: No gross lesions or discharge  Uterus  anteverted, normal size, shape and consistency, non-tender and mobile  Adnexa  Without masses or tenderness  Anus and perineum  normal     Rectovaginal  normal sphincter tone without palpated masses or tenderness             Hemoccult not indicated     Assessment/Plan:  40 y.o. female for annual exam will have the following screening blood work today: Comprehensive metabolic panel, fasting lipid profile, TSH, CBC, and urinalysis. Because of her tiredness and fatigue and past history vitamin D deficiency will check a vitamin D level  today. She was given a requisition to schedule her 3-D mammogram in the next few weeks. She was given instructed to do her monthly breast exams. Pap smear not indicated this year.   Terrance Mass MD, 9:18 AM 06/08/2016

## 2016-06-09 ENCOUNTER — Encounter: Payer: Self-pay | Admitting: Gynecology

## 2016-06-09 ENCOUNTER — Other Ambulatory Visit: Payer: Self-pay | Admitting: Gynecology

## 2016-06-09 DIAGNOSIS — E559 Vitamin D deficiency, unspecified: Secondary | ICD-10-CM

## 2016-06-09 DIAGNOSIS — R7989 Other specified abnormal findings of blood chemistry: Secondary | ICD-10-CM

## 2016-06-09 LAB — URINALYSIS W MICROSCOPIC + REFLEX CULTURE
Bacteria, UA: NONE SEEN [HPF]
Bilirubin Urine: NEGATIVE
Casts: NONE SEEN [LPF]
Crystals: NONE SEEN [HPF]
Glucose, UA: NEGATIVE
Hgb urine dipstick: NEGATIVE
Ketones, ur: NEGATIVE
Leukocytes, UA: NEGATIVE
Nitrite: NEGATIVE
Protein, ur: NEGATIVE
RBC / HPF: NONE SEEN RBC/HPF (ref <=2)
Specific Gravity, Urine: 1.01 (ref 1.001–1.035)
Yeast: NONE SEEN [HPF]
pH: 6 (ref 5.0–8.0)

## 2016-06-09 LAB — VITAMIN D 25 HYDROXY (VIT D DEFICIENCY, FRACTURES): Vit D, 25-Hydroxy: 24 ng/mL — ABNORMAL LOW (ref 30–100)

## 2016-06-09 MED ORDER — VITAMIN D (ERGOCALCIFEROL) 1.25 MG (50000 UNIT) PO CAPS: ORAL_CAPSULE | ORAL | 0 refills | Status: DC

## 2016-06-09 MED ORDER — LEVOTHYROXINE SODIUM 75 MCG PO TABS: 75.0000 ug | ORAL_TABLET | Freq: Every day | ORAL | 0 refills | Status: DC

## 2016-06-10 LAB — URINE CULTURE: Organism ID, Bacteria: NO GROWTH

## 2016-06-14 ENCOUNTER — Other Ambulatory Visit: Payer: Self-pay | Admitting: Physician Assistant

## 2016-06-14 NOTE — Telephone Encounter (Signed)
LMOM with contact name and number [for return call, if needed] RE: requested Rx needing to be p/u this month per provider instructions/SLS 08/08

## 2016-07-06 ENCOUNTER — Other Ambulatory Visit: Payer: Self-pay | Admitting: Gynecology

## 2016-07-08 ENCOUNTER — Other Ambulatory Visit: Payer: Federal, State, Local not specified - PPO

## 2016-07-09 ENCOUNTER — Encounter: Payer: Self-pay | Admitting: Physician Assistant

## 2016-07-14 ENCOUNTER — Other Ambulatory Visit: Payer: Federal, State, Local not specified - PPO

## 2016-07-14 DIAGNOSIS — E559 Vitamin D deficiency, unspecified: Secondary | ICD-10-CM

## 2016-07-14 DIAGNOSIS — R7989 Other specified abnormal findings of blood chemistry: Secondary | ICD-10-CM | POA: Diagnosis not present

## 2016-07-14 LAB — TSH: TSH: 1.36 mIU/L

## 2016-07-15 LAB — VITAMIN D 25 HYDROXY (VIT D DEFICIENCY, FRACTURES): Vit D, 25-Hydroxy: 39 ng/mL (ref 30–100)

## 2016-07-16 ENCOUNTER — Other Ambulatory Visit: Payer: Self-pay | Admitting: Physician Assistant

## 2016-07-18 ENCOUNTER — Telehealth: Payer: Self-pay | Admitting: *Deleted

## 2016-07-18 MED ORDER — LEVOTHYROXINE SODIUM 75 MCG PO TABS: 75.0000 ug | ORAL_TABLET | Freq: Every day | ORAL | 11 refills | Status: DC

## 2016-07-18 NOTE — Telephone Encounter (Signed)
Pt had TSH level drawn on 07/14/16,out of synthroid 75 mcg, okay to fill? Please advise

## 2016-07-18 NOTE — Telephone Encounter (Signed)
Yes

## 2016-07-18 NOTE — Telephone Encounter (Signed)
Pt informed the below note, Rx sent 

## 2016-07-25 ENCOUNTER — Telehealth: Payer: Self-pay | Admitting: *Deleted

## 2016-07-25 ENCOUNTER — Encounter: Payer: Self-pay | Admitting: Gynecology

## 2016-07-25 ENCOUNTER — Ambulatory Visit (INDEPENDENT_AMBULATORY_CARE_PROVIDER_SITE_OTHER): Payer: Federal, State, Local not specified - PPO | Admitting: Gynecology

## 2016-07-25 ENCOUNTER — Other Ambulatory Visit: Payer: Self-pay | Admitting: Gynecology

## 2016-07-25 VITALS — BP 112/70

## 2016-07-25 DIAGNOSIS — R350 Frequency of micturition: Secondary | ICD-10-CM | POA: Diagnosis not present

## 2016-07-25 DIAGNOSIS — Z8639 Personal history of other endocrine, nutritional and metabolic disease: Secondary | ICD-10-CM | POA: Diagnosis not present

## 2016-07-25 DIAGNOSIS — IMO0001 Reserved for inherently not codable concepts without codable children: Secondary | ICD-10-CM

## 2016-07-25 DIAGNOSIS — N3001 Acute cystitis with hematuria: Secondary | ICD-10-CM | POA: Diagnosis not present

## 2016-07-25 DIAGNOSIS — Z3169 Encounter for other general counseling and advice on procreation: Secondary | ICD-10-CM | POA: Diagnosis not present

## 2016-07-25 DIAGNOSIS — R300 Dysuria: Secondary | ICD-10-CM | POA: Diagnosis not present

## 2016-07-25 LAB — URINALYSIS W MICROSCOPIC + REFLEX CULTURE
Casts: NONE SEEN [LPF]
Crystals: NONE SEEN [HPF]
Yeast: NONE SEEN [HPF]

## 2016-07-25 MED ORDER — AMPICILLIN 500 MG PO CAPS: 500.0000 mg | ORAL_CAPSULE | Freq: Two times a day (BID) | ORAL | 0 refills | Status: DC

## 2016-07-25 MED ORDER — AMPICILLIN 250 MG PO CAPS: 250.0000 mg | ORAL_CAPSULE | Freq: Three times a day (TID) | ORAL | 0 refills | Status: DC

## 2016-07-25 MED ORDER — PHENAZOPYRIDINE HCL 200 MG PO TABS: 200.0000 mg | ORAL_TABLET | Freq: Three times a day (TID) | ORAL | 0 refills | Status: DC | PRN

## 2016-07-25 NOTE — Telephone Encounter (Signed)
500 mg twice a day for 7 days will be fine

## 2016-07-25 NOTE — Addendum Note (Signed)
Addended by: Burnett Kanaris on: 07/25/2016 03:57 PM   Modules accepted: Orders

## 2016-07-25 NOTE — Patient Instructions (Addendum)
Phenazopyridine tablets Qu es este medicamento? La FENAZOPIRIDINA es un analgsico. Genene Churn para Best boy, ardor o molestia causada por una infeccin o irritacin del tracto urinario. Este medicamento no es un antibitico. No curar una infeccin del tracto urinario. Este medicamento puede ser utilizado para otros usos; si tiene alguna pregunta consulte con su proveedor de atencin mdica o con su farmacutico. Qu le debo informar a mi profesional de la salud antes de tomar este medicamento? Necesita saber si usted presenta alguno de los siguientes problemas o situaciones: -deficiencia de glucosa-6-fosfato deshidrogenasa (L-6PD) -enfermedad renal -una reaccin alrgica o inusual a la fenazopiridina, a otros medicamentos, alimentos, colorantes o conservantes -si est embarazada o buscando quedar embarazada -si est amamantando a un beb Cmo debo utilizar este medicamento? Tome este medicamento por va oral con un vaso de agua. Siga las instrucciones de la etiqueta del [place]. Tmelo despus de las comidas. Tome sus dosis a intervalos regulares. No tome su medicamento con una frecuencia mayor que la indicada. No omita ninguna dosis o suspenda el uso de su medicamento antes de lo indicado aun si se siente mejor. No deje de tomarlo excepto si as lo indica su mdico. Hable con su pediatra para informarse acerca del uso de este medicamento en nios. Puede requerir atencin especial. Sobredosis: Pngase en contacto inmediatamente con un centro toxicolgico o una sala de urgencia si usted cree que haya tomado demasiado medicamento. ATENCIN: [employer] es solo para usted. No comparta este medicamento con nadie. Qu sucede si me olvido de una dosis? Si olvida una dosis, tmela lo antes posible. Si es casi la hora de la prxima dosis, tome slo esa dosis. No tome dosis adicionales o dobles. Qu puede interactuar con este medicamento? No se esperan interacciones. Puede ser que  esta lista no menciona todas las posibles interacciones. Informe a su profesional de KB Home	[place] de [employer] productos a base de hierbas, medicamentos de [place] o suplementos nutritivos que est tomando. Si usted fuma, consume bebidas alcohlicas o si utiliza drogas ilegales, indqueselo tambin a su profesional de KB Home	[place]. Algunas sustancias pueden interactuar con su medicamento. A qu debo estar atento al usar Coca-Cola? Si los sntomas no mejoran o si empeoran, consulte con su mdico o con su profesional de KB Home	[place]. Este medicamento produce un color rojo en los lquidos corporales. Este efecto es inofensivo y Armed forces operational officer despus de que deje de tomar este medicamento. Este medicamento cambiar el color de su orina a un color naranja oscura o rojo. El color rojo puede manchar la ropa. Los lentes de contacto se pueden Marketing executive. Es preferible no usar lentes de contacto blandos mientras est tomando este medicamento Si es diabtico podr Therapist, music un resultado positivo falso en los anlisis de determinacin del nivel de Location manager en la orina. Consulte a su proveedor de Geophysical data processor. Qu efectos secundarios puedo tener al [employer] este medicamento? Efectos secundarios que debe informar a su mdico o a Barrister's clerk de la salud tan pronto como sea posible: -Chief of Staff como erupcin cutnea, picazn o urticarias, hinchazn de la cara, labios o lengua -color azul o morado de la piel -dificultad al respirar -fiebre -orinar menos -sangrado, magulladuras inusuales -cansancio o debilidad inusual -vmitos -color amarillento de los ojos o la piel Efectos secundarios que, por lo general, no requieren Geophysical data processor (debe informarlos a su mdico o a su profesional de la salud si persisten o si son molestos): -orina de color amarillo oscuro -dolor de cabeza -Engineer, production  ser que esta lista no menciona todos los posibles efectos secundarios. Comunquese a su  mdico por asesoramiento mdico [employer]. Usted puede informar los efectos secundarios a la FDA por telfono al 1-800-FDA-1088. Dnde debo guardar mi medicina? Mantngala fuera del alcance de los nios. Gurdela a [employer], entre 15 y 80 grados C (82 y 48 grados F). Protjala de la luz y de la humedad. Deseche todo el medicamento que no haya utilizado, despus de la fecha de vencimiento. ATENCIN: Este folleto es un resumen. Puede ser que no cubra toda la posible informacin. Si usted tiene preguntas acerca de esta medicina, consulte con su mdico, su farmacutico o su profesional de Technical sales engineer.    2016, Elsevier/Gold Standard. (2014-12-16 00:00:00) Nitrofurantoin tablets or capsules What is this medicine? NITROFURANTOIN (nye troe fyoor AN toyn) is an antibiotic. It is used to treat urinary tract infections. This medicine may be used for other purposes; ask your health care provider or pharmacist if you have questions. What should I tell my health care provider before I take this medicine? They need to know if you have any of these conditions: -anemia -diabetes -glucose-6-phosphate dehydrogenase deficiency -kidney disease -liver disease -lung disease -other chronic illness -an unusual or allergic reaction to nitrofurantoin, other antibiotics, other medicines, foods, dyes or preservatives -pregnant or trying to get pregnant -breast-feeding How should I use this medicine? Take this medicine by mouth with a glass of water. Follow the directions on the prescription label. Take this medicine with food or milk. Take your doses at regular intervals. Do not take your medicine more often than directed. Do not stop taking except on your doctor's advice. Talk to your pediatrician regarding the use of this medicine in children. While this drug may be prescribed for selected conditions, precautions do apply. Overdosage: If you think you have taken too much of this  medicine contact a poison control center or emergency room at once. NOTE: This medicine is only for you. Do not share this medicine with others. What if I miss a dose? If you miss a dose, take it as soon as you can. If it is almost time for your next dose, take only that dose. Do not take double or extra doses. What may interact with this medicine? -antacids containing magnesium trisilicate -probenecid -quinolone antibiotics like ciprofloxacin, lomefloxacin, norfloxacin and ofloxacin -sulfinpyrazone This list may not describe all possible interactions. Give your health care provider a list of all the medicines, herbs, non-prescription drugs, or dietary supplements you use. Also tell them if you smoke, drink alcohol, or use illegal drugs. Some items may interact with your medicine. What should I watch for while using this medicine? Tell your doctor or health care professional if your symptoms do not improve or if you get new symptoms. Drink several glasses of water a day. If you are taking this medicine for a long time, visit your doctor for regular checks on your progress. If you are diabetic, you may get a false positive result for sugar in your urine with certain brands of urine tests. Check with your doctor. What side effects may I notice from receiving this medicine? Side effects that you should report to your doctor or health care professional as soon as possible: -allergic reactions like skin rash or hives, swelling of the face, lips, or tongue -chest pain -cough -difficulty breathing -dizziness, drowsiness -fever or infection -joint aches or pains -pale or blue-tinted skin -redness, blistering, peeling or loosening of the skin, including inside  the mouth -tingling, burning, pain, or numbness in hands or feet -unusual bleeding or bruising -unusually weak or tired -yellowing of eyes or skin Side effects that usually do not require medical attention (report to your doctor or health  care professional if they continue or are bothersome): -dark urine -diarrhea -headache -loss of appetite -nausea or vomiting -temporary hair loss This list may not describe all possible side effects. Call your doctor for medical advice about side effects. You may report side effects to FDA at 1-800-FDA-1088. Where should I keep my medicine? Keep out of the reach of children. Store at room temperature between 15 and 30 degrees C (59 and 86 degrees F). Protect from light. Throw away any unused medicine after the expiration date. NOTE: This sheet is a summary. It may not cover all possible information. If you have questions about this medicine, talk to your doctor, pharmacist, or health care provider.    2016, Elsevier/Gold Standard. (2008-05-14 15:56:47) Infeccin urinaria  (Urinary Tract Infection)  La infeccin urinaria puede ocurrir en Clinical cytogeneticist del tracto urinario. El tracto urinario es un sistema de drenaje del cuerpo por el que se eliminan los desechos y el exceso de [place]. El tracto urinario est formado por dos riones, dos urteres, la vejiga y Geologist, engineering. Los riones son rganos que tienen forma de frijol. Cada rin tiene aproximadamente el tamao del puo. Estn situados debajo de las [place], uno a cada lado de la columna vertebral CAUSAS  La causa de la infeccin son los microbios, que son organismos microscpicos, que incluyen hongos, virus, y bacterias. Estos organismos son tan pequeos que slo pueden verse a travs del microscopio. Las bacterias son los microorganismos que ms comnmente causan infecciones urinarias.  SNTOMAS  Los sntomas pueden variar segn la edad y el sexo del paciente y por la ubicacin de la infeccin. Los sntomas en las mujeres jvenes incluyen la necesidad frecuente e intensa de orinar y una sensacin dolorosa de ardor en la vejiga o en la uretra durante la miccin. Las mujeres y los hombres mayores podrn sentir cansancio, temblores y debilidad y  Arts development officer musculares y Social research officer, government abdominal. Si tiene [place], puede significar que la infeccin est en los riones. Otros sntomas son dolor en la espalda o en los lados debajo de las [place], nuseas y vmitos.  DIAGNSTICO  Para diagnosticar una infeccin urinaria, el mdico le preguntar acerca de sus sntomas. [employer] una [place] de [place]. La muestra de orina se analiza para Hydrographic surveyor bacterias y glbulos blancos de Herbalist. Los glbulos blancos se forman en el organismo para ayudar a Radio broadcast assistant las infecciones.  TRATAMIENTO  Por lo general, las infecciones urinarias pueden tratarse con medicamentos. Debido a que la [employer] de las infecciones son causadas por bacterias, por lo general pueden tratarse con antibiticos. La eleccin del antibitico y la duracin del tratamiento depender de sus sntomas y el tipo de bacteria causante de la infeccin.  INSTRUCCIONES PARA EL CUIDADO EN EL HOGAR   Si le recetaron antibiticos, tmelos exactamente como su mdico le indique. Termine el medicamento aunque se sienta mejor despus de haber tomado slo algunos.  Beba gran cantidad de lquido para mantener la orina de tono claro o color amarillo plido.  Evite la cafena, el t y las bebidas gaseosas. Estas sustancias irritan la vejiga.  Vaciar la vejiga con frecuencia. Evite retener la orina durante largos perodos.  Vace la vejiga antes y despus de Clinical biochemist.  Despus de [place]  mujeres deben higienizarse la regin perineal desde adelante hacia atrs. Use slo un papel tissue por vez. SOLICITE ATENCIN MDICA SI:   Siente dolor en la espalda.  Le sube la fiebre.  Los sntomas no mejoran luego de 3 das. SOLICITE ATENCIN MDICA DE INMEDIATO SI:   Siente dolor intenso en la espalda o en la zona inferior del abdomen.  Comienza a sentir escalofros.  Tiene nuseas o vmitos.  Tiene una sensacin continua de quemazn o molestias al  [employer]. ASEGRESE DE QUE:   Comprende estas instrucciones.  Controlar su enfermedad.  Solicitar ayuda de inmediato si no mejora o empeora.   Esta informacin no tiene Marine scientist el consejo del mdico. Asegrese de hacerle al mdico cualquier pregunta que tenga.   Document Released: 08/03/2005 Document Revised: 07/18/2012 Elsevier Interactive Patient Education 2016 [employer]. Influenza Virus Vaccine (Flucelvax) Qu es este medicamento? La VACUNA ANTIGRIPAL ayuda a disminuir el riesgo de contraer la influenza, tambin conocida como la gripe. La vacuna solo ayuda a protegerle contra algunas cepas de influenza. Este medicamento puede ser utilizado para otros usos; si tiene alguna pregunta consulte con su proveedor de atencin mdica o con su farmacutico. Qu le debo informar a mi profesional de la salud antes de tomar este medicamento? Necesita saber si usted presenta alguno de los siguientes problemas o situaciones: -trastorno de sangrado como hemofilia -fiebre o infeccin -sndrome de Guillain-Barre u otros problemas neurolgicos -problemas del sistema inmunolgico -infeccin por el virus de la inmunodeficiencia humana (VIH) o SIDA -niveles bajos de plaquetas en la sangre -esclerosis mltiple -una reaccin alrgica o inusual a las vacunas antigripales, a otros medicamentos, alimentos, colorantes o conservantes -si est embarazada o buscando quedar embarazada -si est amamantando a un beb Cmo debo utilizar este medicamento? Esta vacuna se administra mediante inyeccin por va intramuscular. Lo administra un profesional de KB Home	[place]. Recibir una copia de informacin escrita sobre la vacuna antes de cada vacuna. Asegrese de leer este folleto cada vez cuidadosamente. Este folleto puede cambiar con frecuencia. Hable con su pediatra para informarse acerca del uso de este medicamento en nios. Puede requerir atencin especial. Sobredosis: Pngase en contacto  inmediatamente con un centro toxicolgico o una sala de urgencia si usted cree que haya tomado demasiado medicamento. ATENCIN: [employer] es solo para usted. No comparta este medicamento con nadie. Qu sucede si me olvido de una dosis? No se aplica en este caso. Qu puede interactuar con este medicamento? -quimioterapia o radioterapia -medicamentos que suprimen el sistema inmunolgico, tales como etanercept, anakinra, infliximab y adalimumab -medicamentos que tratan o previenen cogulos sanguneos, como warfarina -fenitona -medicamentos esteroideos, como la prednisona o la cortisona -teofilina -vacunas Puede ser que esta lista no menciona todas las posibles interacciones. Informe a su profesional de KB Home	[place] de [employer] Corporation productos a base de hierbas, medicamentos de [place] o suplementos nutritivos que est tomando. Si usted fuma, consume bebidas alcohlicas o si utiliza drogas ilegales, indqueselo tambin a su profesional de KB Home	[place]. Algunas sustancias pueden interactuar con su medicamento. A qu debo estar atento al usar Coca-Cola? Informe a su mdico o a Barrister's clerk de la [employer] todos los efectos secundarios que persistan despus de 3 das. Llame a su proveedor de atencin mdica si se presentan sntomas inusuales dentro de las 6 semanas de recibir esta vacuna. Es posible que todava pueda contraer la gripe, pero la enfermedad no ser tan fuerte como normalmente. No puede contraer la gripe de esta vacuna. La vacuna antigripal no le  protege contra resfros u otras enfermedades que pueden causar [place]. Debe vacunarse cada ao. Qu efectos secundarios puedo tener al [employer] este medicamento? Efectos secundarios que debe informar a su mdico o a Barrister's clerk de la salud tan pronto como sea posible: -reacciones alrgicas como erupcin cutnea, picazn o urticarias, hinchazn de la cara, labios o lengua Efectos secundarios que, por lo general, no requieren atencin  mdica (debe informarlos a su mdico o a su profesional de la salud si persisten o si son molestos): -fiebre -dolor de cabeza -molestias y dolores musculares -dolor, sensibilidad, enrojecimiento o Estate agent de la inyeccin -cansancio Puede ser que esta lista no menciona todos los posibles efectos secundarios. Comunquese a su mdico por asesoramiento mdico [employer]. Usted puede informar los efectos secundarios a la FDA por telfono al 1-800-FDA-1088. Dnde debo guardar mi medicina? Esta vacuna se administrar por un profesional de la salud en una [place], Engineer, mining, consultorio mdico u otro consultorio de un profesional de la salud. No se le suministrar esta vacuna para guardar en su domicilio. ATENCIN: Este folleto es un resumen. Puede ser que no cubra toda la posible informacin. Si usted tiene preguntas acerca de esta medicina, consulte con su mdico, su farmacutico o su profesional de Technical sales engineer.    2016, Elsevier/Gold Standard. (2011-10-10 16:29:16) Infertilidad (Infertility) La infertilidad es no poder Retail buyer (concebir) despus de un ao de Theatre manager relaciones sexuales regularmente sin usar ningn mtodo de control de la natalidad. La infertilidad tambin puede significar que una mujer no puede llevar el embarazo a trmino.  Tanto hombres como mujeres pueden tener problemas de fertilidad. CULES SON LAS CAUSAS DE LA INFERTILIDAD? Cules son las causas de la infertilidad en las mujeres? Hay muchas causas posibles de infertilidad en las mujeres. En algunas mujeres, no hay ninguna causa aparente de infertilidad (infertilidad idioptica). La infertilidad tambin puede estar vinculada a ms de [place]. Los problemas de infertilidad en las mujeres pueden deberse a problemas en el ciclo menstrual o los rganos reproductivos, ciertas afecciones mdicas y factores relacionados con la edad y el estilo de vida.  Los problemas en el ciclo menstrual  pueden interferir con los ovarios que producen los vulos (ovulacin). Esto puede causar dificultad para quedar embarazada e incluye tener un ciclo menstrual muy largo, muy corto o irregular.  Los problemas relacionados con los rganos reproductivos incluyen lo siguiente:  Un cuello de tero anormalmente angosto o que no permanece cerrado durante el [place].  Una obstruccin en las trompas de [place].  Un tero de forma anormal.  Fibromas uterinos. Estos son masas de tejido (tumores) que pueden Therapist, art.  Las afecciones mdicas que pueden afectar la fertilidad en las mujeres incluyen lo siguiente:  Sndrome de ovario poliqustico (SOP). Este es un trastorno hormonal que produce pequeos quistes en los ovarios y es la causa ms frecuente de infertilidad en las mujeres.  Endometriosis. Es una enfermedad en la que el tejido que recubre el tero (endometrio) crece fuera de su ubicacin normal.  Insuficiencia ovrica primaria. Esto es cuando los ovarios dejan de producir vulos y hormonas antes de los 61aos de [place].  Enfermedades de transmisin sexual (ETS), como la clamidia o la gonorrea. Estas infecciones pueden provocar fibrosis en las trompas de [place], lo que disminuye la probabilidad de que los vulos lleguen al tero.  Trastornos autoinmunitarios. Estas son afecciones en las que el sistema inmunitario ataca las clulas normales y saludables.  Desequilibrios hormonales.  Otros factores incluyen  los siguientes:  La edad. La fertilidad en las mujeres declina con la edad, especialmente despus de los 35aos.  Tener bajo peso o sobrepeso.  Beber alcohol en exceso.  Consumir drogas.  Hacer ejercicio en exceso.  La exposicin a toxinas ambientales, como la radiacin, los plaguicidas y ciertos qumicos. Cules son las causas de la infertilidad en los hombres? Hay muchas causas de infertilidad en los hombres. La infertilidad puede estar vinculada a ms de [employer]. Los problemas de infertilidad en los hombres pueden deberse a problemas con los espermatozoides o los rganos reproductivos, ciertas afecciones mdicas y factores relacionados con la edad y el estilo de vida. Algunos hombres tienen infertilidad idioptica.   Problemas con los espermatozoides. La infertilidad puede ser consecuencia de un problema para producir lo siguiente:  Suficientes espermatozoides (bajo recuento de espermatozoides).  Suficientes espermatozoides con forma normal (morfologa de los espermatozoides).  Espermatozoides que puedan llegar al vulo (motilidad deficiente).  Las causas de la infertilidad tambin incluyen las siguientes:  Un problema con las hormonas.  Venas agrandadas (varicocele), quistes (espermatocele) o tumores en los testculos.  Disfuncin sexual.  Ardelia Mems lesin en los testculos.  Un defecto de nacimiento, como no tener los conductos que transportan los espermatozoides (conductos deferentes).  Algunas de las afecciones mdicas que pueden afectar la fertilidad en los hombres son las siguientes:  Diabetes.  Tratamiento Social worker, como la radioterapia o la quimioterapia.  El sndrome de [place]. Este es un trastorno gentico hereditario.  Problemas de tiroides, como una tiroides hipoactiva o hiperactiva.  Fibrosis qustica.  Enfermedades de transmisin sexual.  Otros factores incluyen los siguientes:  La edad. La fertilidad en los hombres declina con la edad.  Beber alcohol en exceso.  Consumir drogas.  La exposicin a toxinas ambientales, como la radiacin, los plaguicidas y el plomo. CULES SON LOS SNTOMAS DE LA INFERTILIDAD? El nico signo de infertilidad es no poder Retail buyer despus de un ao de Theatre manager relaciones sexuales regularmente sin usar ningn mtodo de control de la natalidad.  CMO SE DIAGNOSTICA LA INFERTILIDAD? Para recibir un diagnstico de infertilidad, ambos integrantes de la pareja se  harn a un examen fsico. Tambin se analizar en detalle la historia clnica y sexual de ambos. Si no hay una razn evidente de infertilidad, se harn estudios adicionales. Qu estudios se harn las mujeres? Las mujeres primero pueden hacerse estudios para controlar si ovulan todos los meses. Estos estudios pueden incluir lo siguiente:  Anlisis de sangre para [employer] niveles hormonales.  Una ecografa de los ovarios. Este estudio detecta posibles problemas en los ovarios.  Toma de [place] de tejido que recubre el tero para examinarla en el microscopio (biopsia de endometrio). Las mujeres que ovulan pueden realizarse estudios adicionales. Estos pueden incluir los siguientes:  Histerosalpingografa.  Es una radiografa de las trompas de [place] y el tero despus de Tour manager un tipo especfico de sustancia de [place].  Esta prueba puede mostrar la forma del tero y si las trompas de [place] estn abiertas.  Laparoscopia.  En [employer], se [place] un tubo que emite luz (laparoscopio) para Education officer, environmental en las trompas de [place] y otros rganos femeninos.  Ecografa transvaginal.  Este es un estudio de diagnstico por imgenes que determina si hay anomalas en el tero y los ovarios.  El mdico puede utilizar este estudio para contar la cantidad de [employer] ovarios.  Histeroscopa.  En [employer], se [place] un tubo que emite luz para Copy  cuello y el interior del tero.  Se realiza para Airline pilot en el interior del tero. Qu estudios se harn los hombres? Los estudios para Office manager infertilidad en los hombres incluyen los siguientes:  Anlisis de semen para controlar el recuento, la morfologa y la motilidad de los espermatozoides.  Anlisis de sangre para [employer].  Toma de [place] de tejido del interior de un testculo (biopsia). La muestra se examina en el microscopio.  Anlisis  de sangre para Airline pilot genticas (pruebas genticas). CUL ES EL TRATAMIENTO PARA LA INFERTILIDAD EN LAS MUJERES?  El tratamiento depende de la causa de la infertilidad. En la [employer], la infertilidad en las mujeres se trata con medicamentos o [place].  Las mujeres pueden tomar medicamentos para:  Biomedical scientist de ovulacin.  Tratar otras enfermedades, como el [place].  Se puede realizar Clementeen Hoof para lo siguiente:  Reparar daos en los ovarios, las trompas de [place], el cuello del tero o Nurse, learning disability.  Extraer tumores del tero.  Extraer tejido cicatricial del tero, la pelvis u otros rganos femeninos. CUL ES EL TRATAMIENTO PARA LA INFERTILIDAD EN LOS [place]?  El tratamiento depende de la causa de la infertilidad. En la [employer], la infertilidad en los hombres se trata con medicamentos o [place].   Los hombres pueden tomar medicamentos para:  Occupational psychologist.  Tratar otras enfermedades.  Tratar la disfuncin sexual.  Se puede realizar Clementeen Hoof para lo siguiente:  Eliminar obstrucciones en el tracto reproductivo.  Corregir otros problemas estructurales del tracto reproductivo. QU SON LAS TCNICAS DE REPRODUCCIN ASISTIDA? Las tcnicas de reproduccin asistida (TRA) se refieren a todos los tratamientos y procedimientos que unen vulos y espermatozoides fuera del cuerpo para ayudar a una pareja a Counselling psychologist. Las TRA se suelen Oceanographer con medicamentos para la fertilidad que estimulan la ovulacin. En algunos casos, las TRA se llevan a cabo con vulos extrados del cuerpo de otra mujer (vulos de donante) o con vulos previamente fertilizados y congelados (embriones).  Hay diferentes tipos de TRA. Estos incluyen los siguientes:   Inseminacin intrauterina (IIU).  En este procedimiento, los espermatozoides se colocan directamente en el tero de la mujer con un tubo largo y delgado.  Esta tcnica puede ser la ms efectiva  para la infertilidad causada por problemas con los espermatozoides, incluidos el bajo recuento y la baja motilidad.  Se puede utilizar en combinacin con medicamentos para la fertilidad.  Fertilizacin in vitro (FIV).  Por lo general, se [place] esta tcnica cuando las trompas de [place] de la mujer estn obstruidas o si el hombre tiene un bajo recuento de espermatozoides.  Los medicamentos para la infertilidad estimulan los ovarios para que produzcan varios vulos. Cuando estn maduros, estos vulos se extraen del cuerpo y se unen a los espermatozoides para ser fertilizados.  Luego los vulos fertilizados se colocan en el tero de la [place].   Esta informacin no tiene Marine scientist el consejo del mdico. Asegrese de hacerle al mdico cualquier pregunta que tenga.   Document Released: 11/13/2007 Document Revised: 11/14/2014 Elsevier Interactive Patient Education 2016 [place] (Hysterosalpingography) La histerosalpingografa es un procedimiento para observar el interior del tero y las trompas de [place]. Durante este procedimiento, se inyecta una sustancia de contraste en el tero, a travs de la vagina y el cuello del tero para poder visualizar el tero mientras se toman imgenes radiogrficas. Este procedimiento puede ayudar al mdico a diagnosticar tumores, adherencias o  anormalidades estructurales en el tero. Generalmente, se [place] para determinar las razones por las que una mujer no puede tener hijos (infertilidad). El procedimiento suele durar entre 15 y 55 minutos. INFORME A SU MDICO:  Cualquier alergia que tenga.  Todos los [employer], incluidos vitaminas, hierbas, gotas oftlmicas, cremas y medicamentos de venta libre.  Problemas previos que usted o los [employer] de su familia hayan tenido con el uso de anestsicos.  Enfermedades de [employer].  Cirugas previas.  Afecciones mdicas que tenga. RIESGOS Y COMPLICACIONES  En  general, se trata de un procedimiento seguro. Sin embargo, Engineer, technical sales, pueden surgir problemas. Estos son algunos posibles problemas:  Infeccin en el revestimiento interior del tero (endometritis) o en las trompas de [place] (salpingitis).  [place] trompas de Interior.  Reaccin alrgica a la sustancia de [place] para tomar la radiografa. ANTES DEL PROCEDIMIENTO   Debe planificar el procedimiento para despus que finalice su perodo, pero antes de su prxima ovulacin. Esto ocurre por lo general [employer] 5 y 52 de su ltimo perodo. El Da 1 es Engineer, manufacturing systems de su perodo.  Consulte a su mdico si debe cambiar o suspender los medicamentos que toma habitualmente.  Podr comer y beber normalmente.  Antes de comenzar el procedimiento, vace la vejiga. PROCEDIMIENTO  Es posible que le administren un medicamento para Nurse, children's (sedante) o un analgsico de venta libre para Public house manager las molestias durante el procedimiento.  Deber acostarse en una mesa de radiografas con los pies en los estribos.  Le colocarn en la vagina un dispositivo llamado espculo. Esto permite al mdico observar el interior de la vagina hasta el cuello del tero.  El cuello del tero se lava con un jabn especial.  Luego se pasa un tubo delgado y flexible a travs del cuello del tero hasta el tero.  Por el tubo se colocar una sustancia de [place].  Le tomarn varias radiografas a medida que el contraste pasa a travs del tero y las trompas de [place].  Despus del procedimiento se retira el tubo. DESPUS DEL PROCEDIMIENTO   La mayor parte de la sustancia de contraste se eliminar de la vagina naturalmente. Puede ser necesario que use un apsito sanitario.  Puede sentir clicos leves y notar una hemorragia vaginal leve. Luego de 24 horas deben desaparecer.  Pregunte cundo [employer]. Asegrese de [employer].   Esta informacin no tiene Marine scientist el consejo del mdico. Asegrese de hacerle al mdico cualquier pregunta que tenga.   Document Released: 01/16/2012 Document Revised: 10/29/2013 Elsevier Interactive Patient Education [employer].

## 2016-07-25 NOTE — Telephone Encounter (Signed)
Rx sent 

## 2016-07-25 NOTE — Telephone Encounter (Signed)
Pt was prescribed ampicillin 250 mg tablet today, pharmacy called stating that dose has be D/C other option is ampicillin 500 mg tablet or another medication. Please advise

## 2016-07-25 NOTE — Progress Notes (Signed)
HPI: Patient is a 40 year old gravida 0 that presented to the office today to discuss several issues. Her main complaint today is dysuria and frequency. She states she goes several times during the day but doesn't feel like she empties completely. She denies any back pain, fever, chills, nausea, or vomiting. She denies any vaginal discharge. She had a couple of peridium's that she took yesterday to alleviate some of the symptoms until her visit today. Review of her record indicated the past 2 years she's had several urinary tract infection 1 organism identified was Escherichia coli yellow was streptococcus. Recently she was complaining of tiredness and fatigue and her vitamin D level was low and she's currently on vitamin D 50,000 units every weekly to complete a three-month course for which time she will return to the office to check her vitamin D level and then begin taking maintenance dose of vitamin D 3 cholecalciferol 2000 units every daily. Also her TSH of found to be low she does have history of hypothyroidism and her Synthroid was increased from 50-75 g and return back to normal when retested 6 weeks ago. Patient is feeling less tired and fatigued than before these 2 adjustments were made.   Patient also wanted discuss issues in reference to her partner who is had a vasectomy and had 2 children with a previous partner many years ago. She herself has never had any children. She denies any past history of any pelvic inflammatory disease or infections or any pelvic surgery. She reports normal menstrual cycles once a month.   ROS: A ROS was performed and pertinent positives and negatives are included in the history.  GENERAL: No fevers or chills. HEENT: No change in vision, no earache, sore throat or sinus congestion. NECK: No pain or stiffness. CARDIOVASCULAR: No chest pain or pressure. No palpitations. PULMONARY: No shortness of breath, cough or wheeze. GASTROINTESTINAL: No abdominal pain, nausea,  vomiting or diarrhea, melena or bright red blood per rectum. GENITOURINARY:  See above MUSCULOSKELETAL: No joint or muscle pain, no back pain, no recent trauma. DERMATOLOGIC: No rash, no itching, no lesions. ENDOCRINE: No polyuria, polydipsia, no heat or cold intolerance. No recent change in weight. HEMATOLOGICAL: No anemia or easy bruising or bleeding. NEUROLOGIC: No headache, seizures, numbness, tingling or weakness. PSYCHIATRIC: No depression, no loss of interest in normal activity or change in sleep pattern.   PE: blood pressure 112/70 Gen. appearance well-developed well-nourished female in no acute distress Back: No CVA tenderness Abdomen: Soft nontender no rebound or guarding. She did have some suprapubic tenderness. Pelvic exam not done  Urinalysis many bacteria, packed WBC, packed RBC    Assessment Plan: patient with clinical evidence of urinary tract infection. After reviewing her past cultures sensitivity she is going to be placed on ampicillin 250 3 times a day for one week. Prescription for Pyridium 200 mg 3 times a day for 3 days was provided. She was encouraged to increase her fluid intake. I'm going to refer her husband to [facility] for consideration of vasectomy reversal. In the meantime we are going to schedule the patient with her next cycle hysterosalpingogram to see of her tubes are patent. I have given her prescription also for Vibramycin 100 mg to take for 3 days starting the day before the procedure for prophylaxis. We also had a lengthy discussion on the risk of advanced maternal age. We discussed fertility decreasing after the age of 75 and increase in risk of fetal aneuploidy. At the age of 50  her risk would be approximately 1 in 50. We will see her again in consultation after the HSG and after her husband cc urologist. We could care a few months trial of low-dose clomiphene citrate. The risks benefits and pros and cons include hyperstimulation and multiple gestation was  discussed. We also discussed in detail the technique of in vitro fertilization but she stated that she had read about this and it was too costly. Patient was reminded to stay on her vitamin D3 2000 units daily because her past history vitamin D deficiency and to continue her Synthroid since her medication was adjusted recently.     Greater than 50% of time was spent in counseling and coordinating care of this patient.   Time of consultation:  25   Minutes.

## 2016-07-27 ENCOUNTER — Other Ambulatory Visit: Payer: Self-pay | Admitting: Physician Assistant

## 2016-07-27 NOTE — Telephone Encounter (Signed)
Rx request to pharmacy/[facility]  

## 2016-07-28 ENCOUNTER — Ambulatory Visit: Payer: Federal, State, Local not specified - PPO | Admitting: Gynecology

## 2016-07-28 ENCOUNTER — Telehealth: Payer: Self-pay

## 2016-07-28 ENCOUNTER — Other Ambulatory Visit: Payer: Self-pay | Admitting: Gynecology

## 2016-07-28 ENCOUNTER — Telehealth: Payer: Self-pay | Admitting: Physician Assistant

## 2016-07-28 LAB — URINE CULTURE

## 2016-07-28 MED ORDER — CIPROFLOXACIN HCL 250 MG PO TABS: 250.0000 mg | ORAL_TABLET | Freq: Two times a day (BID) | ORAL | 0 refills | Status: DC

## 2016-07-28 MED ORDER — NITROFURANTOIN MONOHYD MACRO 100 MG PO CAPS: ORAL_CAPSULE | ORAL | 2 refills | Status: DC

## 2016-07-28 NOTE — Telephone Encounter (Signed)
Left message on patient's voice mail that new Rx sent in and explained how to use the Cipro and Macrobid. I left her my direct ph  # in case she has any questions.

## 2016-07-28 NOTE — Telephone Encounter (Signed)
Patient said at visit you told her because UTI sx have been happening often for her that you would put 5 refills on antibiotic. When she picked up Rx there were no refills.

## 2016-07-28 NOTE — Telephone Encounter (Signed)
Relation to [id number] Call back number:[phone]   Reason for call:  Patient requesting a refill HYDROcodone-acetaminophen (NORCO) 10-325 MG tablet

## 2016-07-28 NOTE — Telephone Encounter (Signed)
-----   Message from Terrance Mass, MD sent at 07/28/2016  9:57 AM EDT ----- Please inform patient that her urine culture demonstrated that the ampicillin that she is on is resistant to the microorganism identified. Please call in prescription for Cipro 250 mg twice a day for 3 days

## 2016-07-28 NOTE — Telephone Encounter (Signed)
She can take her Cipro 250 mg twice a day for 3 days for the current urinary tract infection. Call in prescription for Macrobid 30 tablets with 2 refills and for her to take 1 after intercourse

## 2016-07-28 NOTE — Telephone Encounter (Signed)
She would need an appointment before any refills would be given of this controlled medication.

## 2016-07-28 NOTE — Telephone Encounter (Signed)
Patient scheduled physical appointment for 07/29/16 at 1:30pm

## 2016-07-29 ENCOUNTER — Other Ambulatory Visit: Payer: Self-pay | Admitting: Gynecology

## 2016-07-29 ENCOUNTER — Ambulatory Visit (INDEPENDENT_AMBULATORY_CARE_PROVIDER_SITE_OTHER): Payer: Federal, State, Local not specified - PPO | Admitting: Physician Assistant

## 2016-07-29 ENCOUNTER — Ambulatory Visit: Payer: Federal, State, Local not specified - PPO | Admitting: Physician Assistant

## 2016-07-29 ENCOUNTER — Ambulatory Visit (HOSPITAL_BASED_OUTPATIENT_CLINIC_OR_DEPARTMENT_OTHER)
Admission: RE | Admit: 2016-07-29 | Discharge: 2016-07-29 | Disposition: A | Discharge status: Home / Self Care | Payer: Federal, State, Local not specified - PPO | Source: Ambulatory Visit | Attending: Physician Assistant | Admitting: Physician Assistant

## 2016-07-29 ENCOUNTER — Encounter: Payer: Self-pay | Admitting: Physician Assistant

## 2016-07-29 VITALS — BP 107/74 | HR 96 | Temp 98.1°F | Resp 16 | Ht 60.25 in | Wt 135.5 lb

## 2016-07-29 DIAGNOSIS — M5431 Sciatica, right side: Secondary | ICD-10-CM | POA: Diagnosis not present

## 2016-07-29 DIAGNOSIS — M5136 Other intervertebral disc degeneration, lumbar region: Secondary | ICD-10-CM | POA: Insufficient documentation

## 2016-07-29 DIAGNOSIS — M545 Low back pain: Secondary | ICD-10-CM | POA: Diagnosis not present

## 2016-07-29 DIAGNOSIS — Z0000 Encounter for general adult medical examination without abnormal findings: Secondary | ICD-10-CM

## 2016-07-29 DIAGNOSIS — Z23 Encounter for immunization: Secondary | ICD-10-CM | POA: Diagnosis not present

## 2016-07-29 DIAGNOSIS — Z1231 Encounter for screening mammogram for malignant neoplasm of breast: Secondary | ICD-10-CM

## 2016-07-29 MED ORDER — ONDANSETRON 8 MG PO TBDP: 8.0000 mg | ORAL_TABLET | Freq: Three times a day (TID) | ORAL | 0 refills | Status: DC | PRN

## 2016-07-29 MED ORDER — CYCLOBENZAPRINE HCL 10 MG PO TABS: 10.0000 mg | ORAL_TABLET | Freq: Three times a day (TID) | ORAL | 0 refills | Status: DC | PRN

## 2016-07-29 MED ORDER — FLUTICASONE PROPIONATE 50 MCG/ACT NA SUSP: 2.0000 | Freq: Every day | NASAL | 3 refills | Status: DC

## 2016-07-29 MED ORDER — HYDROCODONE-ACETAMINOPHEN 10-325 MG PO TABS: 1.0000 | ORAL_TABLET | Freq: Four times a day (QID) | ORAL | 0 refills | Status: DC | PRN

## 2016-07-29 NOTE — Patient Instructions (Signed)
Please go downstairs for imaging. I will call you with your results.  Continue medications as directed. Your insurance will not pay for the Robaxin.  Try to take 1/2 tablet of the Flexeril to avoid drowsiness.

## 2016-07-29 NOTE — Progress Notes (Signed)
Patient presents to clinic today for annual exam.  Patient is fasting for labs.  Chronic Issues: Low back pain -- Patient with history of bulging disc (endorses) found on MRI several years prior. Has had recurrent episodes of sciatica since coming into my care. Most recent was last week. Takes hydrocodone when pain is severe. Otherwise takes tylenol and does stretches to help alleviate symptoms. Patient was previously sent to Neurosurgery for further management. States she was evaluated by Dr. Rita Townsend and was told there was nothing further to be done at present. Has been seeing a chiropractor with little improvement in frequency of flare-ups. Has been some time since last imaging.  Health Maintenance: Immunizations -- Agrees to flu shot today. Tetanus up-to-date. PAP  -- Up-to-date.  Mammogram -- last 06/2015. Due for repeat. She endorses she will schedule.  Past Medical History:  Diagnosis Date  . Chronic constipation   . Hypothyroidism   . Migraine   . Vitamin D deficiency     Past Surgical History:  Procedure Laterality Date  . NOSE SURGERY      Current Outpatient Prescriptions on File Prior to Visit  Medication Sig Dispense Refill  . cetirizine (ZYRTEC) 10 MG tablet Take 10 mg by mouth daily.    . ciprofloxacin (CIPRO) 250 MG tablet Take 1 tablet (250 mg total) by mouth 2 (two) times daily. 6 tablet 0  . diazepam (VALIUM) 10 MG tablet TAKE 1 TABLET BY MOUTH EVERY 12 HOURS AS NEEDED FOR ANXIETY 60 tablet 0  . levothyroxine (SYNTHROID, LEVOTHROID) 75 MCG tablet Take 1 tablet (75 mcg total) by mouth daily before breakfast. 30 tablet 11  . nitrofurantoin, macrocrystal-monohydrate, (MACROBID) 100 MG capsule Take one tablet po after intercourse. 30 capsule 2  . ondansetron (ZOFRAN) 4 MG tablet TAKE 1 TABLET(4 MG) BY MOUTH EVERY 8 HOURS AS NEEDED FOR NAUSEA OR VOMITING 20 tablet 0  . phenazopyridine (PYRIDIUM) 200 MG tablet Take 1 tablet (200 mg total) by mouth 3 (three) times daily  as needed for pain. 9 tablet 0  . rizatriptan (MAXALT) 10 MG tablet TAKE 1 TABLET BY MOUTH AS NEEDED FOR MIGRAINE MAY REPEAT IN 2 HOURS IF NEEDED 10 tablet 3  . Vitamin D, Ergocalciferol, (DRISDOL) 50000 units CAPS capsule Take one tablet by mouth weekly for 12 weeks 12 capsule 0   No current facility-administered medications on file prior to visit.     Allergies  Allergen Reactions  . Lexapro [Escitalopram] Nausea Only  . Motrin [Ibuprofen]     itching    Family History  Problem Relation Age of Onset  . Urolithiasis Father   . Cancer Mother   . Hypertension Mother   . Breast cancer Mother 74  . Breast cancer Maternal Grandmother     dx under 54  . Stomach cancer Paternal Grandmother     dx in her 17s  . Alcohol abuse Maternal Uncle   . Prostate cancer Paternal Uncle     mid 85s    Social History   Social History  . Marital status: Married    Spouse name: N/A  . Number of children: 0  . Years of education: N/A   Occupational History  . Not on file.   Social History Main Topics  . Smoking status: Never Smoker  . Smokeless tobacco: Never Used  . Alcohol use 0.0 oz/week     Comment: occasional  . Drug use: No  . Sexual activity: Yes    Partners: Male  Comment: husband with vasectomy   Other Topics Concern  . Not on file   Social History Narrative  . No narrative on file    Review of Systems  Constitutional: Negative for fever and weight loss.  HENT: Negative for ear discharge, ear pain, hearing loss and tinnitus.   Eyes: Negative for blurred vision, double vision, photophobia and pain.  Respiratory: Negative for cough and shortness of breath.   Cardiovascular: Negative for chest pain and palpitations.  Gastrointestinal: Positive for constipation. Negative for abdominal pain, blood in stool, diarrhea, heartburn, melena, nausea and vomiting.  Genitourinary: Negative for dysuria, flank pain, frequency, hematuria and urgency.  Musculoskeletal: Positive for  back pain. Negative for falls.  Neurological: Negative for dizziness, loss of consciousness and headaches.  Endo/Heme/Allergies: Negative for environmental allergies.  Psychiatric/Behavioral: Negative for depression, hallucinations, substance abuse and suicidal ideas. The patient is not nervous/anxious and does not have insomnia.     BP 107/74 (BP Location: Left Arm, Patient Position: Sitting, Cuff Size: Normal)   Pulse 96   Temp 98.1 F (36.7 C) (Oral)   Resp 16   Ht 5' 0.25" (1.53 m)   Wt 135 lb 8 oz (61.5 kg)   LMP 07/18/2016   SpO2 100%   BMI 26.24 kg/m   Physical Exam  Constitutional: She is oriented to person, place, and time and well-developed, well-nourished, and in no distress.  HENT:  Head: Normocephalic and atraumatic.  Right Ear: Tympanic membrane, external ear and ear canal normal.  Left Ear: Tympanic membrane, external ear and ear canal normal.  Nose: Nose normal. No mucosal edema.  Mouth/Throat: Uvula is midline, oropharynx is clear and moist and mucous membranes are normal. No oropharyngeal exudate or posterior oropharyngeal erythema.  Eyes: Conjunctivae are normal. Pupils are equal, round, and reactive to light.  Neck: Neck supple. No thyromegaly present.  Cardiovascular: Normal rate, regular rhythm, normal heart sounds and intact distal pulses.   Pulmonary/Chest: Effort normal and breath sounds normal. No respiratory distress. She has no wheezes. She has no rales. She exhibits no tenderness.  Abdominal: Soft. Bowel sounds are normal. She exhibits no distension and no mass. There is no tenderness. There is no rebound and no guarding.  Musculoskeletal:       Thoracic back: Normal.       Lumbar back: She exhibits tenderness and pain. She exhibits normal range of motion, no bony tenderness and no spasm.  Lymphadenopathy:    She has no cervical adenopathy.  Neurological: She is alert and oriented to person, place, and time. No cranial nerve deficit.  Skin: Skin is  warm and dry. No rash noted.  Psychiatric: Affect normal.  Vitals reviewed.   Recent Results (from the past 2160 hour(s))  CBC with Differential/Platelet     Status: None   Collection Time: 06/08/16  9:25 AM  Result Value Ref Range   WBC 4.5 3.8 - 10.8 K/uL   RBC 3.94 3.80 - 5.10 MIL/uL   Hemoglobin 12.2 11.7 - 15.5 g/dL   HCT 36.0 35.0 - 45.0 %   MCV 91.4 80.0 - 100.0 fL   MCH 31.0 27.0 - 33.0 pg   MCHC 33.9 32.0 - 36.0 g/dL   RDW 13.1 11.0 - 15.0 %   Platelets 276 140 - 400 K/uL   MPV 10.7 7.5 - 12.5 fL   Neutro Abs 1,845 1,500 - 7,800 cells/uL   Lymphs Abs 2,025 850 - 3,900 cells/uL   Monocytes Absolute 495 200 - 950 cells/uL  Eosinophils Absolute 135 15 - 500 cells/uL   Basophils Absolute 0 0 - 200 cells/uL   Neutrophils Relative % 41 %   Lymphocytes Relative 45 %   Monocytes Relative 11 %   Eosinophils Relative 3 %   Basophils Relative 0 %   Smear Review Criteria for review not met     Comment: ** Please note change in unit of measure and reference range(s). **  Comprehensive metabolic panel     Status: Abnormal   Collection Time: 06/08/16  9:25 AM  Result Value Ref Range   Sodium 137 135 - 146 mmol/L   Potassium 3.8 3.5 - 5.3 mmol/L   Chloride 101 98 - 110 mmol/L   CO2 24 20 - 31 mmol/L   Glucose, Bld 79 65 - 99 mg/dL   BUN 12 7 - 25 mg/dL   Creat 0.92 0.50 - 1.10 mg/dL   Total Bilirubin 0.6 0.2 - 1.2 mg/dL   Alkaline Phosphatase 66 33 - 115 U/L   AST 27 10 - 30 U/L   ALT 33 (H) 6 - 29 U/L   Total Protein 6.5 6.1 - 8.1 g/dL   Albumin 4.0 3.6 - 5.1 g/dL   Calcium 8.6 8.6 - 10.2 mg/dL  Lipid panel     Status: None   Collection Time: 06/08/16  9:25 AM  Result Value Ref Range   Cholesterol 170 125 - 200 mg/dL   Triglycerides 144 <150 mg/dL   HDL 56 >=46 mg/dL   Total CHOL/HDL Ratio 3.0 <=5.0 Ratio   VLDL 29 <30 mg/dL   LDL Cholesterol 85 <130 mg/dL    Comment:   Total Cholesterol/HDL Ratio:CHD Risk                        Coronary Heart Disease Risk  Table                                        Men       Women          1/2 Average Risk              3.4        3.3              Average Risk              5.0        4.4           2X Average Risk              9.6        7.1           3X Average Risk             23.4       11.0 Use the calculated Patient Ratio above and the CHD Risk table  to determine the patient's CHD Risk.   TSH     Status: Abnormal   Collection Time: 06/08/16  9:25 AM  Result Value Ref Range   TSH 6.72 (H) mIU/L    Comment:   Reference Range   > or = 20 Years  0.40-4.50   Pregnancy Range First trimester  0.26-2.66 Second trimester 0.55-2.73 Third trimester  0.43-2.91     Urinalysis w microscopic + reflex cultur     Status: None   Collection Time: 06/08/16  9:25 AM  Result Value Ref Range   Color, Urine YELLOW YELLOW    Comment: ** Please note change in unit of measure and reference range(s). **      APPearance CLEAR CLEAR   Specific Gravity, Urine 1.010 1.001 - 1.035   pH 6.0 5.0 - 8.0   Glucose, UA NEGATIVE NEGATIVE   Bilirubin Urine NEGATIVE NEGATIVE   Ketones, ur NEGATIVE NEGATIVE   Hgb urine dipstick NEGATIVE NEGATIVE   Protein, ur NEGATIVE NEGATIVE   Nitrite NEGATIVE NEGATIVE   Leukocytes, UA NEGATIVE NEGATIVE   WBC, UA 0-5 <=5 WBC/HPF   RBC / HPF NONE SEEN <=2 RBC/HPF   Squamous Epithelial / LPF 0-5 <=5 HPF   Bacteria, UA NONE SEEN NONE SEEN HPF   Crystals NONE SEEN NONE SEEN HPF   Casts NONE SEEN NONE SEEN LPF   Yeast NONE SEEN NONE SEEN HPF  VITAMIN D 25 Hydroxy (Vit-D Deficiency, Fractures)     Status: Abnormal   Collection Time: 06/08/16  9:25 AM  Result Value Ref Range   Vit D, 25-Hydroxy 24 (L) 30 - 100 ng/mL    Comment: Vitamin D Status           25-OH Vitamin D        Deficiency                <20 ng/mL        Insufficiency         20 - 29 ng/mL        Optimal             > or = 30 ng/mL   For 25-OH Vitamin D testing on patients on D2-supplementation and patients for whom  quantitation of D2 and D3 fractions is required, the [facility] 25-OH VIT D, (D2,D3), LC/MS/MS is recommended: order code [id number] (patients > 2 yrs).   Urine culture     Status: None   Collection Time: 06/08/16  9:25 AM  Result Value Ref Range   Organism ID, Bacteria NO GROWTH   TSH     Status: None   Collection Time: 07/14/16  9:09 AM  Result Value Ref Range   TSH 1.36 mIU/L    Comment:   Reference Range   > or = 20 Years  0.40-4.50   Pregnancy Range First trimester  0.26-2.66 Second trimester 0.55-2.73 Third trimester  0.43-2.91     Vitamin D (25 hydroxy)     Status: None   Collection Time: 07/14/16  9:09 AM  Result Value Ref Range   Vit D, 25-Hydroxy 39 30 - 100 ng/mL    Comment: Vitamin D Status           25-OH Vitamin D        Deficiency                <20 ng/mL        Insufficiency         20 - 29 ng/mL        Optimal             > or = 30 ng/mL   For 25-OH Vitamin D testing on patients on D2-supplementation and patients for whom quantitation of D2 and D3 fractions is required, the [facility] 25-OH VIT D, (D2,D3), LC/MS/MS is recommended: order code [id number] (patients > 2 yrs).   Urinalysis with Culture Reflex     Status: Abnormal   Collection Time: 07/25/16  4:07 PM  Result Value Ref  Range   Color, Urine ORANGE (A) YELLOW    Comment: Biochemicals may be affected by the color of the urine.   APPearance CLOUDY (A) CLEAR   Specific Gravity, Urine CANCELED 1.001 - 1.035    Comment: Biochemical results removed due to color interference.  Result canceled by the ancillary    pH CANCELED 5.0 - 8.0    Comment: Biochemical results removed due to color interference.  Result canceled by the ancillary    Glucose, UA CANCELED NEGATIVE    Comment: Biochemical results removed due to color interference.  Result canceled by the ancillary    Bilirubin Urine CANCELED NEGATIVE    Comment: Biochemical results removed due to color interference.  Result canceled by the  ancillary    Ketones, ur CANCELED NEGATIVE    Comment: Biochemical results removed due to color interference.  Result canceled by the ancillary    Hgb urine dipstick CANCELED NEGATIVE    Comment: Biochemical results removed due to color interference.  Result canceled by the ancillary    Protein, ur CANCELED NEGATIVE    Comment: Biochemical results removed due to color interference.  Result canceled by the ancillary    Nitrite CANCELED NEGATIVE    Comment: Biochemical results removed due to color interference.  Result canceled by the ancillary    Leukocytes, UA CANCELED NEGATIVE    Comment: Biochemical results removed due to color interference.  Result canceled by the ancillary    WBC, UA PACKED (A) <=5 WBC/HPF   RBC / HPF PACKED (A) <=2 RBC/HPF   Squamous Epithelial / LPF 6-10 (A) <=5 HPF   Bacteria, UA MANY (A) NONE SEEN HPF   Crystals NONE SEEN NONE SEEN HPF   Casts NONE SEEN NONE SEEN LPF   Yeast NONE SEEN NONE SEEN HPF   Urine-Other SEE NOTE     Comment: Moderate mucous threads *URINE CULTURE PENDING* NR=NOT REPORTABLE,SEE COMMENT ORAL therapy:A cefazolin MIC of <32 predicts  susceptibility to the oral agents cefaclor, cefdinir,cefpodoxime,cefprozil,cefuroxime, cephalexin,and loracarbef when used for therapy  of uncomplicated UTIs due to E.coli,K.pneumomiae, and P.mirabilis. PARENTERAL therapy: A cefazolin MIC of >8 indicates resistance to parenteral cefazolin. An alternate test method must be performed to confirm susceptibility to parenteral cefazolin.   Urine culture     Status: None   Collection Time: 07/25/16  4:07 PM  Result Value Ref Range   Colony Count 50,000-100,000 CFU/mL    Organism ID, Bacteria ESCHERICHIA COLI       Susceptibility   Escherichia coli -  (no method available)    AMPICILLIN >=32 Resistant     AMOX/CLAVULANIC 4 Sensitive     AMPICILLIN/SULBACTAM 16 Intermediate     PIP/TAZO <=4 Sensitive     IMIPENEM <=0.25 Sensitive      CEFAZOLIN <=4 Not Reportable     CEFTRIAXONE <=1 Sensitive     CEFTAZIDIME <=1 Sensitive     CEFEPIME <=1 Sensitive     GENTAMICIN <=1 Sensitive     TOBRAMYCIN <=1 Sensitive     CIPROFLOXACIN <=0.25 Sensitive     LEVOFLOXACIN <=0.12 Sensitive     NITROFURANTOIN <=16 Sensitive     TRIMETH/SULFA >=320 Resistant    Assessment/Plan: Visit for preventive health examination Depression screen negative. Health Maintenance reviewed -- flu shot given. Patient to call and schedule mammogram. Preventive schedule discussed and handout given in AVS. Will obtain fasting labs today.   Sciatica, right Will obtain repeat x-ray today to further assess. Will likely need repeat MRI. Will attempt to  get notes from specialist. Hydrocodone refilled. UDS obtained today.    Leeanne Rio, PA-C

## 2016-07-29 NOTE — Progress Notes (Signed)
Pre visit review using our clinic review tool, if applicable. No additional management support is needed unless otherwise documented below in the visit note/SLS  

## 2016-08-02 ENCOUNTER — Other Ambulatory Visit: Payer: Self-pay | Admitting: Physician Assistant

## 2016-08-02 DIAGNOSIS — M5431 Sciatica, right side: Secondary | ICD-10-CM | POA: Insufficient documentation

## 2016-08-02 DIAGNOSIS — M5416 Radiculopathy, lumbar region: Secondary | ICD-10-CM

## 2016-08-02 DIAGNOSIS — M5126 Other intervertebral disc displacement, lumbar region: Secondary | ICD-10-CM

## 2016-08-02 NOTE — Assessment & Plan Note (Signed)
Will obtain repeat x-ray today to further assess. Will likely need repeat MRI. Will attempt to get notes from specialist. Hydrocodone refilled. UDS obtained today.

## 2016-08-02 NOTE — Assessment & Plan Note (Signed)
Depression screen negative. Health Maintenance reviewed -- flu shot given. Patient to call and schedule mammogram. Preventive schedule discussed and handout given in AVS. Will obtain fasting labs today.

## 2016-08-04 ENCOUNTER — Telehealth: Payer: Self-pay | Admitting: Physician Assistant

## 2016-08-04 NOTE — Telephone Encounter (Signed)
Caller name: Relationship to patient: Self Can be reached: [phone] Pharmacy:  Reason for call: Patient called stating she is waiting for PCP to contact her about a medication for constipation. States she was told by PCP that he would find out which medication insurance covers. Plse adv

## 2016-08-05 MED ORDER — LINACLOTIDE 145 MCG PO CAPS: 145.0000 ug | ORAL_CAPSULE | Freq: Every day | ORAL | 1 refills | Status: DC

## 2016-08-05 NOTE — Telephone Encounter (Signed)
Per what I can see the [facility] is preferred. I have sent in a script to the pharmacy. Have her let us know if there are any issues.

## 2016-08-06 ENCOUNTER — Ambulatory Visit (HOSPITAL_BASED_OUTPATIENT_CLINIC_OR_DEPARTMENT_OTHER)
Admission: RE | Admit: 2016-08-06 | Discharge: 2016-08-06 | Disposition: A | Discharge status: Home / Self Care | Payer: Federal, State, Local not specified - PPO | Source: Ambulatory Visit | Attending: Gynecology | Admitting: Gynecology

## 2016-08-06 ENCOUNTER — Ambulatory Visit (HOSPITAL_BASED_OUTPATIENT_CLINIC_OR_DEPARTMENT_OTHER)
Admission: RE | Admit: 2016-08-06 | Discharge: 2016-08-06 | Disposition: A | Discharge status: Home / Self Care | Payer: Federal, State, Local not specified - PPO | Source: Ambulatory Visit | Attending: Physician Assistant | Admitting: Physician Assistant

## 2016-08-06 DIAGNOSIS — M5416 Radiculopathy, lumbar region: Secondary | ICD-10-CM | POA: Insufficient documentation

## 2016-08-06 DIAGNOSIS — M4806 Spinal stenosis, lumbar region: Secondary | ICD-10-CM | POA: Insufficient documentation

## 2016-08-06 DIAGNOSIS — M5126 Other intervertebral disc displacement, lumbar region: Secondary | ICD-10-CM | POA: Insufficient documentation

## 2016-08-06 DIAGNOSIS — Z1231 Encounter for screening mammogram for malignant neoplasm of breast: Secondary | ICD-10-CM | POA: Diagnosis not present

## 2016-08-06 DIAGNOSIS — M545 Low back pain: Secondary | ICD-10-CM | POA: Diagnosis not present

## 2016-08-06 DIAGNOSIS — M5136 Other intervertebral disc degeneration, lumbar region: Secondary | ICD-10-CM | POA: Diagnosis not present

## 2016-08-08 ENCOUNTER — Other Ambulatory Visit: Payer: Self-pay | Admitting: Physician Assistant

## 2016-08-08 DIAGNOSIS — M48061 Spinal stenosis, lumbar region without neurogenic claudication: Secondary | ICD-10-CM

## 2016-08-08 DIAGNOSIS — M5416 Radiculopathy, lumbar region: Secondary | ICD-10-CM

## 2016-08-10 ENCOUNTER — Encounter: Payer: Self-pay | Admitting: Physician Assistant

## 2016-08-10 NOTE — Telephone Encounter (Signed)
Please check on status of Neurosurgery referral and follow-up with patient regarding waiting time.   Thank you

## 2016-08-15 ENCOUNTER — Telehealth: Payer: Self-pay | Admitting: *Deleted

## 2016-08-15 ENCOUNTER — Other Ambulatory Visit: Payer: Self-pay | Admitting: Physician Assistant

## 2016-08-15 ENCOUNTER — Other Ambulatory Visit: Payer: Federal, State, Local not specified - PPO

## 2016-08-15 DIAGNOSIS — R748 Abnormal levels of other serum enzymes: Secondary | ICD-10-CM

## 2016-08-15 DIAGNOSIS — N979 Female infertility, unspecified: Secondary | ICD-10-CM

## 2016-08-15 LAB — AST: AST: 16 U/L (ref 10–30)

## 2016-08-15 LAB — ALT: ALT: 10 U/L (ref 6–29)

## 2016-08-15 NOTE — Telephone Encounter (Signed)
Orders placed. Per note on 07/25/16 pt needs HSG, LMP:08/12/16 appointment on 08/19/16 @ 8:15 am at [facility] hospital unable to leave message her voicemail box is not set up.

## 2016-08-15 NOTE — Telephone Encounter (Signed)
Rx request to pharmacy/[facility]  

## 2016-08-15 NOTE — Telephone Encounter (Signed)
Lisa Townsend informed pt with time and date of HSG

## 2016-08-15 NOTE — Telephone Encounter (Signed)
This is the patient that you spoke with in the waiting area this am, you told pt repeat AST/ALTdue to abnormal liver  test. Pt states you told her to come back on day 3 of her cycle to have labs, LMP 08/12/16. Pt now wants to know what does her liver test have to do with infertility? Please advise

## 2016-08-15 NOTE — Telephone Encounter (Signed)
I just informed Lisa Townsend since the patient is in the lobby. We are going to check her liver function test but also check a day 3 FSH because she is 40 years of age and would like to get pregnant.

## 2016-08-16 LAB — FOLLICLE STIMULATING HORMONE: FSH: 11.7 m[IU]/mL

## 2016-08-17 ENCOUNTER — Other Ambulatory Visit: Payer: Self-pay | Admitting: Physician Assistant

## 2016-08-17 NOTE — Telephone Encounter (Signed)
Rx request faxed to pharmacy/SLS  

## 2016-08-17 NOTE — Telephone Encounter (Signed)
eScribe request from [facility] for refill on Diazepam Last filled - 06/14/16, #[id number] Last AEX - 07/29/16 Please Advise on refills/SLS 10/11

## 2016-08-19 ENCOUNTER — Ambulatory Visit (HOSPITAL_COMMUNITY)
Admission: RE | Admit: 2016-08-19 | Discharge: 2016-08-19 | Disposition: A | Discharge status: Home / Self Care | Payer: Federal, State, Local not specified - PPO | Source: Ambulatory Visit | Attending: Gynecology | Admitting: Gynecology

## 2016-08-19 DIAGNOSIS — Z3141 Encounter for fertility testing: Secondary | ICD-10-CM | POA: Diagnosis not present

## 2016-08-19 DIAGNOSIS — N979 Female infertility, unspecified: Secondary | ICD-10-CM

## 2016-08-19 MED ORDER — IOPAMIDOL (ISOVUE-300) INJECTION 61%
30.0000 mL | Freq: Once | INTRAVENOUS | Status: AC | PRN
  Administered 2016-08-19: 5 mL

## 2016-08-29 ENCOUNTER — Other Ambulatory Visit: Payer: Self-pay | Admitting: Gynecology

## 2016-08-31 ENCOUNTER — Encounter: Payer: Self-pay | Admitting: Physician Assistant

## 2016-08-31 MED ORDER — HYDROCODONE-ACETAMINOPHEN 10-325 MG PO TABS: 1.0000 | ORAL_TABLET | Freq: Four times a day (QID) | ORAL | 0 refills | Status: DC | PRN

## 2016-09-13 ENCOUNTER — Telehealth: Payer: Self-pay | Admitting: *Deleted

## 2016-09-13 ENCOUNTER — Encounter: Payer: Self-pay | Admitting: Gynecology

## 2016-09-13 ENCOUNTER — Other Ambulatory Visit: Payer: Self-pay | Admitting: Physician Assistant

## 2016-09-13 MED ORDER — FLUCONAZOLE 150 MG PO TABS: 150.0000 mg | ORAL_TABLET | Freq: Once | ORAL | 0 refills | Status: AC

## 2016-09-13 NOTE — Telephone Encounter (Signed)
Rx sent, pt aware 

## 2016-09-13 NOTE — Telephone Encounter (Signed)
Pt called c/o yeast infection vaginal itching only, unable to come in today as she will be leaving out of town x 3 days later today. Pt asked if diflucan Rx could be sent? Please advise

## 2016-09-13 NOTE — Telephone Encounter (Signed)
Yes

## 2016-09-15 DIAGNOSIS — M5416 Radiculopathy, lumbar region: Secondary | ICD-10-CM | POA: Diagnosis not present

## 2016-09-15 DIAGNOSIS — Z6825 Body mass index (BMI) 25.0-25.9, adult: Secondary | ICD-10-CM | POA: Diagnosis not present

## 2016-09-19 ENCOUNTER — Institutional Professional Consult (permissible substitution): Payer: Federal, State, Local not specified - PPO | Admitting: Gynecology

## 2016-09-20 ENCOUNTER — Other Ambulatory Visit: Payer: Self-pay | Admitting: Physician Assistant

## 2016-09-22 ENCOUNTER — Ambulatory Visit: Payer: Federal, State, Local not specified - PPO | Admitting: Physician Assistant

## 2016-09-23 ENCOUNTER — Encounter: Payer: Self-pay | Admitting: Physician Assistant

## 2016-09-23 ENCOUNTER — Ambulatory Visit (INDEPENDENT_AMBULATORY_CARE_PROVIDER_SITE_OTHER): Payer: Federal, State, Local not specified - PPO | Admitting: Physician Assistant

## 2016-09-23 VITALS — BP 110/70 | HR 72 | Temp 98.6°F | Resp 14 | Ht 60.25 in | Wt 133.0 lb

## 2016-09-23 DIAGNOSIS — J020 Streptococcal pharyngitis: Secondary | ICD-10-CM | POA: Diagnosis not present

## 2016-09-23 DIAGNOSIS — B9689 Other specified bacterial agents as the cause of diseases classified elsewhere: Secondary | ICD-10-CM | POA: Diagnosis not present

## 2016-09-23 DIAGNOSIS — J019 Acute sinusitis, unspecified: Secondary | ICD-10-CM | POA: Diagnosis not present

## 2016-09-23 LAB — POCT RAPID STREP A (OFFICE): Rapid Strep A Screen: NEGATIVE

## 2016-09-23 MED ORDER — AMOXICILLIN-POT CLAVULANATE 875-125 MG PO TABS: 1.0000 | ORAL_TABLET | Freq: Two times a day (BID) | ORAL | 0 refills | Status: DC

## 2016-09-23 NOTE — Patient Instructions (Signed)
Please take antibiotic as directed.  Increase fluid intake.  Use Saline nasal spray.  Take a daily multivitamin. Continue OTC medications.  Place a humidifier in the bedroom.  Please call or return clinic if symptoms are not improving.  Sinusitis Sinusitis is redness, soreness, and swelling (inflammation) of the paranasal sinuses. Paranasal sinuses are air pockets within the bones of your face (beneath the eyes, the middle of the forehead, or above the eyes). In healthy paranasal sinuses, mucus is able to drain out, and air is able to circulate through them by way of your nose. However, when your paranasal sinuses are inflamed, mucus and air can become trapped. This can allow bacteria and other germs to grow and cause infection. Sinusitis can develop quickly and last only a short time (acute) or continue over a long period (chronic). Sinusitis that lasts for more than 12 weeks is considered chronic.  CAUSES  Causes of sinusitis include:  Allergies.  Structural abnormalities, such as displacement of the cartilage that separates your nostrils (deviated septum), which can decrease the air flow through your nose and sinuses and affect sinus drainage.  Functional abnormalities, such as when the small hairs (cilia) that line your sinuses and help remove mucus do not work properly or are not present. SYMPTOMS  Symptoms of acute and chronic sinusitis are the same. The primary symptoms are pain and pressure around the affected sinuses. Other symptoms include:  Upper toothache.  Earache.  Headache.  Bad breath.  Decreased sense of smell and taste.  A cough, which worsens when you are lying flat.  Fatigue.  Fever.  Thick drainage from your nose, which often is green and may contain pus (purulent).  Swelling and warmth over the affected sinuses. DIAGNOSIS  Your caregiver will perform a physical exam. During the exam, your caregiver may:  Look in your nose for signs of abnormal growths in  your nostrils (nasal polyps).  Tap over the affected sinus to check for signs of infection.  View the inside of your sinuses (endoscopy) with a special imaging device with a light attached (endoscope), which is inserted into your sinuses. If your caregiver suspects that you have chronic sinusitis, one or more of the following tests may be recommended:  Allergy tests.  Nasal culture A sample of mucus is taken from your nose and sent to a lab and screened for bacteria.  Nasal cytology A sample of mucus is taken from your nose and examined by your caregiver to determine if your sinusitis is related to an allergy. TREATMENT  Most cases of acute sinusitis are related to a viral infection and will resolve on their own within 10 days. Sometimes medicines are prescribed to help relieve symptoms (pain medicine, decongestants, nasal steroid sprays, or saline sprays).  However, for sinusitis related to a bacterial infection, your caregiver will prescribe antibiotic medicines. These are medicines that will help kill the bacteria causing the infection.  Rarely, sinusitis is caused by a fungal infection. In theses cases, your caregiver will prescribe antifungal medicine. For some cases of chronic sinusitis, surgery is needed. Generally, these are cases in which sinusitis recurs more than 3 times per year, despite other treatments. HOME CARE INSTRUCTIONS   Drink plenty of water. Water helps thin the mucus so your sinuses can drain more easily.  Use a humidifier.  Inhale steam 3 to 4 times a day (for example, sit in the bathroom with the shower running).  Apply a warm, moist washcloth to your face 3 to 4   times a day, or as directed by your caregiver.  Use saline nasal sprays to help moisten and clean your sinuses.  Take over-the-counter or prescription medicines for pain, discomfort, or fever only as directed by your caregiver. SEEK IMMEDIATE MEDICAL CARE IF:  You have increasing pain or severe  headaches.  You have nausea, vomiting, or drowsiness.  You have swelling around your face.  You have vision problems.  You have a stiff neck.  You have difficulty breathing. MAKE SURE YOU:   Understand these instructions.  Will watch your condition.  Will get help right away if you are not doing well or get worse. Document Released: 10/24/2005 Document Revised: 01/16/2012 Document Reviewed: 11/08/2011 [employer] Patient Information 2014 [employer].   

## 2016-09-23 NOTE — Addendum Note (Signed)
Addended by: Leonidas Romberg on: 09/23/2016 10:16 AM   Modules accepted: Orders

## 2016-09-23 NOTE — Progress Notes (Signed)
Patient presents to clinic today c/o 5 days of progressively worsening sinus congestion, pressure, frontal sinus pain with sore throat and PND. Denies ear pressure or ear pain. Endorses cough that is now productive of yellow sputum. Endorses low-grade fever at 99.6. Denies recent travel or sick contact. Has been taking Mucinex and Dayquil for symptom relief.   Past Medical History:  Diagnosis Date  . Chronic constipation   . Hypothyroidism   . Migraine   . Vitamin D deficiency     Current Outpatient Prescriptions on File Prior to Visit  Medication Sig Dispense Refill  . cetirizine (ZYRTEC) 10 MG tablet Take 10 mg by mouth daily.    . diazepam (VALIUM) 10 MG tablet TAKE 1 TABLET BY MOUTH EVERY 12 HOURS AS NEEDED FOR ANXIETY 60 tablet 1  . fluticasone (FLONASE) 50 MCG/ACT nasal spray SHAKE LIQUID AND USE 2 SPRAYS IN EACH NOSTRIL DAILY 16 g 3  . HYDROcodone-acetaminophen (NORCO) 10-325 MG tablet Take 1 tablet by mouth every 6 (six) hours as needed for severe pain. 30 tablet 0  . levothyroxine (SYNTHROID, LEVOTHROID) 75 MCG tablet Take 1 tablet (75 mcg total) by mouth daily before breakfast. 30 tablet 11  . linaclotide (LINZESS) 145 MCG CAPS capsule Take 1 capsule (145 mcg total) by mouth daily before breakfast. 30 capsule 1  . nitrofurantoin, macrocrystal-monohydrate, (MACROBID) 100 MG capsule Take one tablet po after intercourse. 30 capsule 2  . ondansetron (ZOFRAN ODT) 8 MG disintegrating tablet Take 1 tablet (8 mg total) by mouth every 8 (eight) hours as needed for nausea or vomiting. 20 tablet 0  . ondansetron (ZOFRAN) 4 MG tablet TAKE 1 TABLET(4 MG) BY MOUTH EVERY 8 HOURS AS NEEDED FOR NAUSEA OR VOMITING 20 tablet 0  . rizatriptan (MAXALT) 10 MG tablet TAKE 1 TABLET BY MOUTH AS NEEDED FOR MIGRAINE MAY REPEAT IN 2 HOURS IF NEEDED 10 tablet 0  . cyclobenzaprine (FLEXERIL) 10 MG tablet Take 1 tablet (10 mg total) by mouth 3 (three) times daily as needed for muscle spasms. (Patient not  taking: Reported on 09/23/2016) 30 tablet 0   No current facility-administered medications on file prior to visit.     Allergies  Allergen Reactions  . Lexapro [Escitalopram] Nausea Only  . Motrin [Ibuprofen]     itching    Family History  Problem Relation Age of Onset  . Urolithiasis Father   . Cancer Mother   . Hypertension Mother   . Breast cancer Mother 74  . Breast cancer Maternal Grandmother     dx under 78  . Stomach cancer Paternal Grandmother     dx in her 32s  . Alcohol abuse Maternal Uncle   . Prostate cancer Paternal Uncle     mid 24s    Social History   Social History  . Marital status: Married    Spouse name: N/A  . Number of children: 0  . Years of education: N/A   Social History Main Topics  . Smoking status: Never Smoker  . Smokeless tobacco: Never Used  . Alcohol use 0.0 oz/week     Comment: occasional  . Drug use: No  . Sexual activity: Yes    Partners: Male     Comment: husband with vasectomy   Other Topics Concern  . None   Social History Narrative  . None    Review of Systems - See HPI.  All other ROS are negative.  BP 110/70   Pulse 72   Temp 98.6 F (37  C) (Oral)   Resp 14   Ht 5' 0.25" (1.53 m)   Wt 133 lb (60.3 kg)   SpO2 98%   BMI 25.76 kg/m   Physical Exam  Constitutional: She is oriented to person, place, and time and well-developed, well-nourished, and in no distress.  HENT:  Head: Normocephalic and atraumatic.  Right Ear: Tympanic membrane normal.  Left Ear: Tympanic membrane normal.  Nose: Right sinus exhibits maxillary sinus tenderness.  Mouth/Throat: Uvula is midline, oropharynx is clear and moist and mucous membranes are normal.  Eyes: Conjunctivae are normal.  Neck: Neck supple.  Cardiovascular: Normal rate, regular rhythm, normal heart sounds and intact distal pulses.   Pulmonary/Chest: Effort normal and breath sounds normal. No respiratory distress. She has no wheezes. She has no rales. She exhibits no  tenderness.  Lymphadenopathy:    She has no cervical adenopathy.  Neurological: She is alert and oriented to person, place, and time.  Skin: Skin is warm and dry. No rash noted.  Psychiatric: Affect normal.  Vitals reviewed.   Recent Results (from the past 2160 hour(s))  TSH     Status: None   Collection Time: 07/14/16  9:09 AM  Result Value Ref Range   TSH 1.36 mIU/L    Comment:   Reference Range   > or = 20 Years  0.40-4.50   Pregnancy Range First trimester  0.26-2.66 Second trimester 0.55-2.73 Third trimester  0.43-2.91     Vitamin D (25 hydroxy)     Status: None   Collection Time: 07/14/16  9:09 AM  Result Value Ref Range   Vit D, 25-Hydroxy 39 30 - 100 ng/mL    Comment: Vitamin D Status           25-OH Vitamin D        Deficiency                <20 ng/mL        Insufficiency         20 - 29 ng/mL        Optimal             > or = 30 ng/mL   For 25-OH Vitamin D testing on patients on D2-supplementation and patients for whom quantitation of D2 and D3 fractions is required, the [facility]AssureD 25-OH VIT D, (D2,D3), LC/MS/MS is recommended: order code [phone] (patients > 2 yrs).   Urinalysis with Culture Reflex     Status: Abnormal   Collection Time: 07/25/16  4:07 PM  Result Value Ref Range   Color, Urine ORANGE (A) YELLOW    Comment: Biochemicals may be affected by the color of the urine.   APPearance CLOUDY (A) CLEAR   Specific Gravity, Urine CANCELED 1.001 - 1.035    Comment: Biochemical results removed due to color interference.  Result canceled by the ancillary    pH CANCELED 5.0 - 8.0    Comment: Biochemical results removed due to color interference.  Result canceled by the ancillary    Glucose, UA CANCELED NEGATIVE    Comment: Biochemical results removed due to color interference.  Result canceled by the ancillary    Bilirubin Urine CANCELED NEGATIVE    Comment: Biochemical results removed due to color interference.  Result canceled by the  ancillary    Ketones, ur CANCELED NEGATIVE    Comment: Biochemical results removed due to color interference.  Result canceled by the ancillary    Hgb urine dipstick CANCELED NEGATIVE    Comment: Biochemical  results removed due to color interference.  Result canceled by the ancillary    Protein, ur CANCELED NEGATIVE    Comment: Biochemical results removed due to color interference.  Result canceled by the ancillary    Nitrite CANCELED NEGATIVE    Comment: Biochemical results removed due to color interference.  Result canceled by the ancillary    Leukocytes, UA CANCELED NEGATIVE    Comment: Biochemical results removed due to color interference.  Result canceled by the ancillary    WBC, UA PACKED (A) <=5 WBC/HPF   RBC / HPF PACKED (A) <=2 RBC/HPF   Squamous Epithelial / LPF 6-10 (A) <=5 HPF   Bacteria, UA MANY (A) NONE SEEN HPF   Crystals NONE SEEN NONE SEEN HPF   Casts NONE SEEN NONE SEEN LPF   Yeast NONE SEEN NONE SEEN HPF   Urine-Other SEE NOTE     Comment: Moderate mucous threads *URINE CULTURE PENDING* NR=NOT REPORTABLE,SEE COMMENT ORAL therapy:A cefazolin MIC of <32 predicts  susceptibility to the oral agents cefaclor, cefdinir,cefpodoxime,cefprozil,cefuroxime, cephalexin,and loracarbef when used for therapy  of uncomplicated UTIs due to E.coli,K.pneumomiae, and P.mirabilis. PARENTERAL therapy: A cefazolin MIC of >8 indicates resistance to parenteral cefazolin. An alternate test method must be performed to confirm susceptibility to parenteral cefazolin.   Urine culture     Status: None   Collection Time: 07/25/16  4:07 PM  Result Value Ref Range   Colony Count 50,000-100,000 CFU/mL    Organism ID, Bacteria ESCHERICHIA COLI       Susceptibility   Escherichia coli -  (no method available)    AMPICILLIN >=32 Resistant     AMOX/CLAVULANIC 4 Sensitive     AMPICILLIN/SULBACTAM 16 Intermediate     PIP/TAZO <=4 Sensitive     IMIPENEM <=0.25 Sensitive      CEFAZOLIN <=4 Not Reportable     CEFTRIAXONE <=1 Sensitive     CEFTAZIDIME <=1 Sensitive     CEFEPIME <=1 Sensitive     GENTAMICIN <=1 Sensitive     TOBRAMYCIN <=1 Sensitive     CIPROFLOXACIN <=0.25 Sensitive     LEVOFLOXACIN <=0.12 Sensitive     NITROFURANTOIN <=16 Sensitive     TRIMETH/SULFA >=320 Resistant   FSH     Status: None   Collection Time: 08/15/16 10:26 AM  Result Value Ref Range   FSH 11.7 mIU/mL    Comment:   Reference Range Female >=34 years of age: Follicular Phase [id number] Mid-Cycle Peak   3.1-17.7 Luteal Phase     1.5-9.1 Postmenopausal   23.0-116.3     ALT     Status: None   Collection Time: 08/15/16 10:26 AM  Result Value Ref Range   ALT 10 6 - 29 U/L  AST     Status: None   Collection Time: 08/15/16 10:26 AM  Result Value Ref Range   AST 16 10 - 30 U/L    Assessment/Plan: 1. Acute bacterial sinusitis Rapid strep negative. Rx Augmentin. Supportive measures reviewed. FU PRN if symptoms are not improving.  - amoxicillin-clavulanate (AUGMENTIN) 875-125 MG tablet; Take 1 tablet by mouth 2 (two) times daily.  Dispense: 14 tablet; Refill: 0   Leeanne Rio, [place]

## 2016-09-23 NOTE — Progress Notes (Signed)
Pre visit review using our clinic review tool, if applicable. No additional management support is needed unless otherwise documented below in the visit note. 

## 2016-09-27 ENCOUNTER — Encounter: Payer: Self-pay | Admitting: Physician Assistant

## 2016-09-29 ENCOUNTER — Other Ambulatory Visit: Payer: Self-pay | Admitting: Physician Assistant

## 2016-10-05 ENCOUNTER — Other Ambulatory Visit: Payer: Self-pay | Admitting: Physician Assistant

## 2016-10-05 NOTE — Telephone Encounter (Signed)
Refill sent per [facility] refill protocol/SLS  

## 2016-10-10 ENCOUNTER — Other Ambulatory Visit: Payer: Self-pay | Admitting: Physician Assistant

## 2016-10-10 NOTE — Telephone Encounter (Signed)
Refill sent per [facility] refill protocol/SLS  

## 2016-10-13 ENCOUNTER — Telehealth: Payer: Self-pay | Admitting: Emergency Medicine

## 2016-10-13 ENCOUNTER — Encounter: Payer: Self-pay | Admitting: Emergency Medicine

## 2016-10-13 DIAGNOSIS — K5900 Constipation, unspecified: Secondary | ICD-10-CM | POA: Insufficient documentation

## 2016-10-13 NOTE — Telephone Encounter (Signed)
After calling [employer] for PA for Linzess 145 mcg daily  Medication approved starting on 11/07/2016-02/05/2017 for 90 capsule for 90 days.

## 2016-11-08 ENCOUNTER — Encounter: Payer: Self-pay | Admitting: Physician Assistant

## 2016-11-10 ENCOUNTER — Other Ambulatory Visit: Payer: Self-pay | Admitting: Physician Assistant

## 2016-11-11 ENCOUNTER — Other Ambulatory Visit: Payer: Self-pay | Admitting: Emergency Medicine

## 2016-11-11 NOTE — Telephone Encounter (Signed)
Can we try to get approved. See MyChart message from patient. Rx Amitiza 8 mcg BID. Quantity 60 with 1 refill. Has tried and failed OTC medications -- fiber supplements, stool softeners, Miralax. Has also failed Linzess.

## 2016-11-14 ENCOUNTER — Other Ambulatory Visit: Payer: Self-pay | Admitting: Physician Assistant

## 2016-11-14 MED ORDER — RIZATRIPTAN BENZOATE 10 MG PO TABS: ORAL_TABLET | ORAL | 2 refills | Status: DC

## 2016-11-15 ENCOUNTER — Other Ambulatory Visit: Payer: Self-pay | Admitting: Emergency Medicine

## 2016-11-15 MED ORDER — LUBIPROSTONE 8 MCG PO CAPS: 8.0000 ug | ORAL_CAPSULE | Freq: Two times a day (BID) | ORAL | 3 refills | Status: DC

## 2016-11-15 NOTE — Progress Notes (Signed)
[employer] PA approved for Amitiza 8 mcg bid #180 every 90 days Approved from 11/07/2016-02/09/2017. Notified patient and pharmacy.

## 2016-11-23 ENCOUNTER — Other Ambulatory Visit: Payer: Self-pay | Admitting: Physician Assistant

## 2016-12-28 ENCOUNTER — Encounter: Payer: Self-pay | Admitting: Physician Assistant

## 2017-01-18 ENCOUNTER — Other Ambulatory Visit: Payer: Self-pay | Admitting: Physician Assistant

## 2017-01-19 NOTE — Telephone Encounter (Signed)
Rx faxed to the [facility]

## 2017-01-19 NOTE — Telephone Encounter (Signed)
Diazepam Last filled 08/17/16 #60 1 RF Last ov: 09/23/16 Acute CSC:04/08/16 UDS: 07/09/16  Please advise

## 2017-01-24 ENCOUNTER — Encounter: Payer: Self-pay | Admitting: Physician Assistant

## 2017-01-24 ENCOUNTER — Other Ambulatory Visit: Payer: Self-pay | Admitting: Physician Assistant

## 2017-03-05 ENCOUNTER — Emergency Department (HOSPITAL_BASED_OUTPATIENT_CLINIC_OR_DEPARTMENT_OTHER)
Admission: EM | Admit: 2017-03-05 | Discharge: 2017-03-05 | Disposition: A | Discharge status: Home / Self Care | Payer: Federal, State, Local not specified - PPO | Attending: Emergency Medicine | Admitting: Emergency Medicine

## 2017-03-05 ENCOUNTER — Encounter (HOSPITAL_BASED_OUTPATIENT_CLINIC_OR_DEPARTMENT_OTHER): Payer: Self-pay | Admitting: Emergency Medicine

## 2017-03-05 ENCOUNTER — Emergency Department (HOSPITAL_BASED_OUTPATIENT_CLINIC_OR_DEPARTMENT_OTHER): Payer: Federal, State, Local not specified - PPO

## 2017-03-05 DIAGNOSIS — Y999 Unspecified external cause status: Secondary | ICD-10-CM | POA: Insufficient documentation

## 2017-03-05 DIAGNOSIS — T07XXXA Unspecified multiple injuries, initial encounter: Secondary | ICD-10-CM

## 2017-03-05 DIAGNOSIS — Y939 Activity, unspecified: Secondary | ICD-10-CM | POA: Diagnosis not present

## 2017-03-05 DIAGNOSIS — S00531A Contusion of lip, initial encounter: Secondary | ICD-10-CM | POA: Diagnosis not present

## 2017-03-05 DIAGNOSIS — E039 Hypothyroidism, unspecified: Secondary | ICD-10-CM | POA: Insufficient documentation

## 2017-03-05 DIAGNOSIS — S0990XA Unspecified injury of head, initial encounter: Secondary | ICD-10-CM | POA: Diagnosis not present

## 2017-03-05 DIAGNOSIS — S5001XA Contusion of right elbow, initial encounter: Secondary | ICD-10-CM | POA: Diagnosis not present

## 2017-03-05 DIAGNOSIS — S00511A Abrasion of lip, initial encounter: Secondary | ICD-10-CM | POA: Diagnosis not present

## 2017-03-05 DIAGNOSIS — S060X0A Concussion without loss of consciousness, initial encounter: Secondary | ICD-10-CM | POA: Diagnosis not present

## 2017-03-05 DIAGNOSIS — Y929 Unspecified place or not applicable: Secondary | ICD-10-CM | POA: Insufficient documentation

## 2017-03-05 DIAGNOSIS — S5002XA Contusion of left elbow, initial encounter: Secondary | ICD-10-CM | POA: Insufficient documentation

## 2017-03-05 DIAGNOSIS — S0003XA Contusion of scalp, initial encounter: Secondary | ICD-10-CM | POA: Diagnosis not present

## 2017-03-05 DIAGNOSIS — R51 Headache: Secondary | ICD-10-CM | POA: Diagnosis not present

## 2017-03-05 DIAGNOSIS — Z79899 Other long term (current) drug therapy: Secondary | ICD-10-CM | POA: Diagnosis not present

## 2017-03-05 NOTE — ED Notes (Signed)
ED Provider at bedside. 
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
## 2017-03-22 ENCOUNTER — Encounter: Payer: Self-pay | Admitting: Gynecology

## 2017-03-28 ENCOUNTER — Other Ambulatory Visit: Payer: Self-pay | Admitting: Physician Assistant

## 2017-03-30 DIAGNOSIS — F341 Dysthymic disorder: Secondary | ICD-10-CM | POA: Diagnosis not present

## 2017-03-30 DIAGNOSIS — F4310 Post-traumatic stress disorder, unspecified: Secondary | ICD-10-CM | POA: Diagnosis not present

## 2017-03-30 DIAGNOSIS — Z630 Problems in relationship with spouse or partner: Secondary | ICD-10-CM | POA: Diagnosis not present

## 2017-03-30 DIAGNOSIS — Z634 Disappearance and death of family member: Secondary | ICD-10-CM | POA: Diagnosis not present

## 2017-04-04 ENCOUNTER — Encounter: Payer: Self-pay | Admitting: Emergency Medicine

## 2017-04-04 NOTE — Telephone Encounter (Signed)
Patient calling back upset that the medication she requested to be fill was not filled.  She said her migraine medication that she has been on is the one she wants refilled.  She is requesting a call back to advise when and why the medication was changed.

## 2017-04-04 NOTE — Telephone Encounter (Signed)
Refill sent. She needs follow-up for further refills or if migraine is not going away today with this medication.
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
## 2017-04-04 NOTE — Telephone Encounter (Signed)
Pt calling checking status on this, states that she is out of meds and is having a migraine now.

## 2017-04-04 NOTE — Telephone Encounter (Signed)
My chart message sent notifying patient rx sent to the pharmacy. If her headache/migraine continues then she needs to schedule an appointment.

## 2017-04-05 DIAGNOSIS — Z630 Problems in relationship with spouse or partner: Secondary | ICD-10-CM | POA: Diagnosis not present

## 2017-04-05 DIAGNOSIS — Z23 Encounter for immunization: Secondary | ICD-10-CM | POA: Diagnosis not present

## 2017-04-05 DIAGNOSIS — Z6911 Encounter for mental health services for victim of spousal or partner abuse: Secondary | ICD-10-CM | POA: Diagnosis not present

## 2017-04-05 DIAGNOSIS — F4310 Post-traumatic stress disorder, unspecified: Secondary | ICD-10-CM | POA: Diagnosis not present

## 2017-04-05 DIAGNOSIS — F341 Dysthymic disorder: Secondary | ICD-10-CM | POA: Diagnosis not present

## 2017-04-12 DIAGNOSIS — Z630 Problems in relationship with spouse or partner: Secondary | ICD-10-CM | POA: Diagnosis not present

## 2017-04-12 DIAGNOSIS — F341 Dysthymic disorder: Secondary | ICD-10-CM | POA: Diagnosis not present

## 2017-04-12 DIAGNOSIS — Z603 Acculturation difficulty: Secondary | ICD-10-CM | POA: Diagnosis not present

## 2017-04-12 DIAGNOSIS — Z569 Unspecified problems related to employment: Secondary | ICD-10-CM | POA: Diagnosis not present

## 2017-04-19 DIAGNOSIS — Z634 Disappearance and death of family member: Secondary | ICD-10-CM | POA: Diagnosis not present

## 2017-04-19 DIAGNOSIS — F341 Dysthymic disorder: Secondary | ICD-10-CM | POA: Diagnosis not present

## 2017-04-19 DIAGNOSIS — Z630 Problems in relationship with spouse or partner: Secondary | ICD-10-CM | POA: Diagnosis not present

## 2017-04-19 DIAGNOSIS — Z569 Unspecified problems related to employment: Secondary | ICD-10-CM | POA: Diagnosis not present

## 2017-04-25 DIAGNOSIS — F341 Dysthymic disorder: Secondary | ICD-10-CM | POA: Diagnosis not present

## 2017-04-25 DIAGNOSIS — Z630 Problems in relationship with spouse or partner: Secondary | ICD-10-CM | POA: Diagnosis not present

## 2017-04-25 DIAGNOSIS — Z603 Acculturation difficulty: Secondary | ICD-10-CM | POA: Diagnosis not present

## 2017-05-04 DIAGNOSIS — Z634 Disappearance and death of family member: Secondary | ICD-10-CM | POA: Diagnosis not present

## 2017-05-04 DIAGNOSIS — Z630 Problems in relationship with spouse or partner: Secondary | ICD-10-CM | POA: Diagnosis not present

## 2017-05-04 DIAGNOSIS — F341 Dysthymic disorder: Secondary | ICD-10-CM | POA: Diagnosis not present

## 2017-05-04 DIAGNOSIS — Z603 Acculturation difficulty: Secondary | ICD-10-CM | POA: Diagnosis not present

## 2017-05-26 ENCOUNTER — Other Ambulatory Visit: Payer: Self-pay | Admitting: Physician Assistant

## 2017-05-26 ENCOUNTER — Other Ambulatory Visit: Payer: Self-pay | Admitting: Emergency Medicine

## 2017-05-26 ENCOUNTER — Encounter: Payer: Self-pay | Admitting: Physician Assistant

## 2017-05-30 ENCOUNTER — Other Ambulatory Visit: Payer: Self-pay | Admitting: Emergency Medicine

## 2017-05-30 DIAGNOSIS — F411 Generalized anxiety disorder: Secondary | ICD-10-CM | POA: Diagnosis not present

## 2017-05-30 DIAGNOSIS — Z630 Problems in relationship with spouse or partner: Secondary | ICD-10-CM | POA: Diagnosis not present

## 2017-05-30 MED ORDER — DIAZEPAM 10 MG PO TABS: 10.0000 mg | ORAL_TABLET | Freq: Two times a day (BID) | ORAL | 0 refills | Status: DC | PRN

## 2017-05-30 NOTE — Telephone Encounter (Signed)
Refill granted. She is due for UDS. Will have to pick up Rx.

## 2017-05-30 NOTE — Telephone Encounter (Signed)
Diazepam last rx was 01/19/17 #60 CSC: 04/08/16 UDS: 07/09/16  Please advise

## 2017-06-05 ENCOUNTER — Encounter: Payer: Self-pay | Admitting: Physician Assistant

## 2017-06-05 ENCOUNTER — Telehealth: Payer: Self-pay | Admitting: Physician Assistant

## 2017-06-05 NOTE — Telephone Encounter (Signed)
Patient needs documentation that she received a flu vaccine last year as well as documentation of her last tetanus vaccine.  Please print for patient.  She will have her husband, Lisa Townsend pick it up tomorrow morning at 8am.

## 2017-06-05 NOTE — Telephone Encounter (Signed)
Immunization report printed and placed at front desk for pt to pick up.

## 2017-06-06 ENCOUNTER — Telehealth: Payer: Self-pay | Admitting: Physician Assistant

## 2017-06-06 ENCOUNTER — Other Ambulatory Visit: Payer: Self-pay | Admitting: Physician Assistant

## 2017-06-06 ENCOUNTER — Encounter: Payer: Self-pay | Admitting: Physician Assistant

## 2017-06-06 ENCOUNTER — Ambulatory Visit (INDEPENDENT_AMBULATORY_CARE_PROVIDER_SITE_OTHER): Payer: Federal, State, Local not specified - PPO | Admitting: Physician Assistant

## 2017-06-06 ENCOUNTER — Ambulatory Visit (INDEPENDENT_AMBULATORY_CARE_PROVIDER_SITE_OTHER): Payer: Federal, State, Local not specified - PPO

## 2017-06-06 DIAGNOSIS — M79645 Pain in left finger(s): Secondary | ICD-10-CM

## 2017-06-06 DIAGNOSIS — M79642 Pain in left hand: Secondary | ICD-10-CM | POA: Diagnosis not present

## 2017-06-06 DIAGNOSIS — S62605A Fracture of unspecified phalanx of left ring finger, initial encounter for closed fracture: Secondary | ICD-10-CM

## 2017-06-06 DIAGNOSIS — Z23 Encounter for immunization: Secondary | ICD-10-CM | POA: Diagnosis not present

## 2017-06-06 DIAGNOSIS — S6992XA Unspecified injury of left wrist, hand and finger(s), initial encounter: Secondary | ICD-10-CM | POA: Diagnosis not present

## 2017-06-06 DIAGNOSIS — F411 Generalized anxiety disorder: Secondary | ICD-10-CM | POA: Diagnosis not present

## 2017-06-06 DIAGNOSIS — S62645A Nondisplaced fracture of proximal phalanx of left ring finger, initial encounter for closed fracture: Secondary | ICD-10-CM | POA: Diagnosis not present
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
## 2017-06-06 NOTE — Progress Notes (Signed)
Pre visit review using our clinic review tool, if applicable. No additional management support is needed unless otherwise documented below in the visit note. 

## 2017-06-06 NOTE — Patient Instructions (Signed)
The bruising is healing. The hardness under the skin is from calcification secondary to the injury. This will take some time to resolve. The swelling under the bruising on arm is most likely a hematoma. This should continue to resolve on its own. Tylenol if needed for pain.   Please go to the [facility] office for imaging of the finger. I will call with results. (The girls up front will give you directions).  For the nail bed, please keep clean and dry. Apply topical neosporin to the area. This should improve over the next few days. If not, please give me a call.

## 2017-06-06 NOTE — Telephone Encounter (Signed)
Patient states she has reviewed her x-ray results on MyChart and it states she has a fracture.  She wants to know if she needs to be referred to a specialist.

## 2017-06-06 NOTE — Telephone Encounter (Signed)
Please advise 

## 2017-06-06 NOTE — Telephone Encounter (Signed)
Spoke with patient concerning results. I have placed urgent referral to Ortho/hand surgery. Can we call around to see who can see patient today? Thank you.

## 2017-06-06 NOTE — Telephone Encounter (Signed)
Pt scheduled today at 2:30pm with Dr. Mayer Camel at [facility].

## 2017-06-13 DIAGNOSIS — F411 Generalized anxiety disorder: Secondary | ICD-10-CM | POA: Diagnosis not present

## 2017-06-19 DIAGNOSIS — F411 Generalized anxiety disorder: Secondary | ICD-10-CM | POA: Diagnosis not present

## 2017-06-21 DIAGNOSIS — F411 Generalized anxiety disorder: Secondary | ICD-10-CM | POA: Diagnosis not present

## 2017-06-22 ENCOUNTER — Encounter: Payer: Self-pay | Admitting: Emergency Medicine

## 2017-06-26 ENCOUNTER — Encounter: Payer: Self-pay | Admitting: Physician Assistant

## 2017-06-26 ENCOUNTER — Ambulatory Visit (INDEPENDENT_AMBULATORY_CARE_PROVIDER_SITE_OTHER): Payer: Federal, State, Local not specified - PPO | Admitting: Physician Assistant

## 2017-06-26 VITALS — BP 108/64 | HR 66 | Temp 98.4°F | Resp 14 | Ht 61.0 in | Wt 137.0 lb

## 2017-06-26 DIAGNOSIS — J019 Acute sinusitis, unspecified: Secondary | ICD-10-CM | POA: Diagnosis not present

## 2017-06-26 DIAGNOSIS — B9689 Other specified bacterial agents as the cause of diseases classified elsewhere: Secondary | ICD-10-CM

## 2017-06-26 DIAGNOSIS — F411 Generalized anxiety disorder: Secondary | ICD-10-CM | POA: Diagnosis not present

## 2017-06-26 MED ORDER — DOXYCYCLINE HYCLATE 100 MG PO TABS: 100.0000 mg | ORAL_TABLET | Freq: Two times a day (BID) | ORAL | 0 refills | Status: DC

## 2017-06-26 NOTE — Patient Instructions (Signed)
Please take antibiotic as directed.  Increase fluid intake.  Use Saline nasal spray.  Take a daily multivitamin. Start Mucinex-D.  Place a humidifier in the bedroom.  Please call or return clinic if symptoms are not improving.  Sinusitis Sinusitis is redness, soreness, and swelling (inflammation) of the paranasal sinuses. Paranasal sinuses are air pockets within the bones of your face (beneath the eyes, the middle of the forehead, or above the eyes). In healthy paranasal sinuses, mucus is able to drain out, and air is able to circulate through them by way of your nose. However, when your paranasal sinuses are inflamed, mucus and air can become trapped. This can allow bacteria and other germs to grow and cause infection. Sinusitis can develop quickly and last only a short time (acute) or continue over a long period (chronic). Sinusitis that lasts for more than 12 weeks is considered chronic.  CAUSES  Causes of sinusitis include:  Allergies.  Structural abnormalities, such as displacement of the cartilage that separates your nostrils (deviated septum), which can decrease the air flow through your nose and sinuses and affect sinus drainage.  Functional abnormalities, such as when the small hairs (cilia) that line your sinuses and help remove mucus do not work properly or are not present. SYMPTOMS  Symptoms of acute and chronic sinusitis are the same. The primary symptoms are pain and pressure around the affected sinuses. Other symptoms include:  Upper toothache.  Earache.  Headache.  Bad breath.  Decreased sense of smell and taste.  A cough, which worsens when you are lying flat.  Fatigue.  Fever.  Thick drainage from your nose, which often is green and may contain pus (purulent).  Swelling and warmth over the affected sinuses. DIAGNOSIS  Your caregiver will perform a physical exam. During the exam, your caregiver may:  Look in your nose for signs of abnormal growths in your  nostrils (nasal polyps).  Tap over the affected sinus to check for signs of infection.  View the inside of your sinuses (endoscopy) with a special imaging device with a light attached (endoscope), which is inserted into your sinuses. If your caregiver suspects that you have chronic sinusitis, one or more of the following tests may be recommended:  Allergy tests.  Nasal culture A sample of mucus is taken from your nose and sent to a lab and screened for bacteria.  Nasal cytology A sample of mucus is taken from your nose and examined by your caregiver to determine if your sinusitis is related to an allergy. TREATMENT  Most cases of acute sinusitis are related to a viral infection and will resolve on their own within 10 days. Sometimes medicines are prescribed to help relieve symptoms (pain medicine, decongestants, nasal steroid sprays, or saline sprays).  However, for sinusitis related to a bacterial infection, your caregiver will prescribe antibiotic medicines. These are medicines that will help kill the bacteria causing the infection.  Rarely, sinusitis is caused by a fungal infection. In theses cases, your caregiver will prescribe antifungal medicine. For some cases of chronic sinusitis, surgery is needed. Generally, these are cases in which sinusitis recurs more than 3 times per year, despite other treatments. HOME CARE INSTRUCTIONS   Drink plenty of water. Water helps thin the mucus so your sinuses can drain more easily.  Use a humidifier.  Inhale steam 3 to 4 times a day (for example, sit in the bathroom with the shower running).  Apply a warm, moist washcloth to your face 3 to 4 times  a day, or as directed by your caregiver.  Use saline nasal sprays to help moisten and clean your sinuses.  Take over-the-counter or prescription medicines for pain, discomfort, or fever only as directed by your caregiver. SEEK IMMEDIATE MEDICAL CARE IF:  You have increasing pain or severe  headaches.  You have nausea, vomiting, or drowsiness.  You have swelling around your face.  You have vision problems.  You have a stiff neck.  You have difficulty breathing. MAKE SURE YOU:   Understand these instructions.  Will watch your condition.  Will get help right away if you are not doing well or get worse. Document Released: 10/24/2005 Document Revised: 01/16/2012 Document Reviewed: 11/08/2011 [facility] Patient Information 2014 [place], [place].

## 2017-06-26 NOTE — Progress Notes (Signed)
Pre visit review using our clinic review tool, if applicable. No additional management support is needed unless otherwise documented below in the visit note. 

## 2017-06-26 NOTE — Progress Notes (Signed)
Patient presents to clinic today c/o > 1 week of sinus congestion, sinus pain, fever (Tmax 102). Initially with chest congestion and productive cough that have resolved.  + ear pressure, fatigue, worsening sinus pressure/pain. Denies chest pain, recent travel or sick contact.   Past Medical History:  Diagnosis Date  . Chronic constipation   . Hypothyroidism   . Migraine   . Vitamin D deficiency     Current Outpatient Prescriptions on File Prior to Visit  Medication Sig Dispense Refill  . diazepam (VALIUM) 10 MG tablet Take 1 tablet (10 mg total) by mouth every 12 (twelve) hours as needed. for anxiety 60 tablet 0  . fluticasone (FLONASE) 50 MCG/ACT nasal spray SHAKE LIQUID AND USE 2 SPRAYS IN EACH NOSTRIL DAILY 16 g 3  . levothyroxine (SYNTHROID, LEVOTHROID) 75 MCG tablet Take 1 tablet (75 mcg total) by mouth daily before breakfast. 30 tablet 11  . nitrofurantoin, macrocrystal-monohydrate, (MACROBID) 100 MG capsule Take one tablet po after intercourse. 30 capsule 2  . ondansetron (ZOFRAN-ODT) 8 MG disintegrating tablet DISSOLVE 1 TABLET(8 MG) ON THE TONGUE EVERY 8 HOURS AS NEEDED FOR NAUSEA OR VOMITING 20 tablet 0  . rizatriptan (MAXALT) 10 MG tablet TAKE 1 TABLET BY MOUTH AS NEEDED FOR MIGRAINE, MAY REPEAT IN 2 HOURS IF NEEDED 10 tablet 0   No current facility-administered medications on file prior to visit.     Allergies  Allergen Reactions  . Lexapro [Escitalopram] Nausea Only  . Motrin [Ibuprofen]     itching    Family History  Problem Relation Age of Onset  . Urolithiasis Father   . Cancer Mother   . Hypertension Mother   . Breast cancer Mother 50  . Breast cancer Maternal Grandmother        dx under 24  . Stomach cancer Paternal Grandmother        dx in her 50s  . Alcohol abuse Maternal Uncle   . Prostate cancer Paternal Uncle        mid 71s    Social History   Social History  . Marital status: Married    Spouse name: N/A  . Number of children: 0  . Years  of education: N/A   Social History Main Topics  . Smoking status: Never Smoker  . Smokeless tobacco: Never Used  . Alcohol use 0.0 oz/week     Comment: occasional  . Drug use: No  . Sexual activity: Yes    Partners: Male     Comment: husband with vasectomy   Other Topics Concern  . None   Social History Narrative  . None   Review of Systems - See HPI.  All other ROS are negative.  BP 108/64   Pulse 66   Temp 98.4 F (36.9 C) (Oral)   Resp 14   Ht 5\' 1"  (1.549 m)   Wt 137 lb (62.1 kg)   SpO2 99%   BMI 25.89 kg/m   Physical Exam  Constitutional: She is well-developed, well-nourished, and in no distress.  HENT:  Head: Normocephalic and atraumatic.  Right Ear: Tympanic membrane normal.  Left Ear: Tympanic membrane normal.  Nose: Mucosal edema and rhinorrhea present. Right sinus exhibits maxillary sinus tenderness. Left sinus exhibits maxillary sinus tenderness.  Mouth/Throat: Uvula is midline, oropharynx is clear and moist and mucous membranes are normal.  Eyes: Conjunctivae are normal.  Cardiovascular: Normal rate, regular rhythm, normal heart sounds and intact distal pulses.   Pulmonary/Chest: Effort normal and breath sounds normal.  No respiratory distress. She has no wheezes. She has no rales. She exhibits no tenderness.  Neurological: She is alert.  Skin: Skin is warm and dry. No rash noted.  Psychiatric: Affect normal.  Vitals reviewed.   Assessment/Plan: Rx Doxycycline.  Increase fluids.  Rest.  Saline nasal spray.  Probiotic.  Mucinex as directed.  Humidifier in bedroom.  Call or return to clinic if symptoms are not improving.   Leeanne Rio, PA-C

## 2017-06-28 ENCOUNTER — Encounter: Payer: Self-pay | Admitting: Physician Assistant

## 2017-07-04 DIAGNOSIS — F411 Generalized anxiety disorder: Secondary | ICD-10-CM | POA: Diagnosis not present

## 2017-07-07 ENCOUNTER — Encounter: Payer: Self-pay | Admitting: Physician Assistant

## 2017-07-11 MED ORDER — AMOXICILLIN-POT CLAVULANATE 875-125 MG PO TABS: 1.0000 | ORAL_TABLET | Freq: Two times a day (BID) | ORAL | 0 refills | Status: DC

## 2017-07-17 DIAGNOSIS — F411 Generalized anxiety disorder: Secondary | ICD-10-CM | POA: Diagnosis not present

## 2017-07-18 ENCOUNTER — Other Ambulatory Visit: Payer: Self-pay | Admitting: Gynecology

## 2017-07-18 ENCOUNTER — Other Ambulatory Visit: Payer: Self-pay | Admitting: *Deleted

## 2017-07-18 MED ORDER — LEVOTHYROXINE SODIUM 75 MCG PO TABS: 75.0000 ug | ORAL_TABLET | Freq: Every day | ORAL | 0 refills | Status: DC

## 2017-07-24 DIAGNOSIS — F411 Generalized anxiety disorder: Secondary | ICD-10-CM | POA: Diagnosis not present

## 2017-07-29 DIAGNOSIS — F411 Generalized anxiety disorder: Secondary | ICD-10-CM | POA: Diagnosis not present

## 2017-08-02 ENCOUNTER — Ambulatory Visit (INDEPENDENT_AMBULATORY_CARE_PROVIDER_SITE_OTHER): Payer: Federal, State, Local not specified - PPO | Admitting: Physician Assistant

## 2017-08-02 ENCOUNTER — Encounter: Payer: Self-pay | Admitting: Physician Assistant

## 2017-08-02 VITALS — BP 108/60 | HR 66 | Temp 98.5°F | Resp 14 | Ht 61.0 in | Wt 138.0 lb

## 2017-08-02 DIAGNOSIS — E039 Hypothyroidism, unspecified: Secondary | ICD-10-CM | POA: Diagnosis not present

## 2017-08-02 DIAGNOSIS — F5101 Primary insomnia: Secondary | ICD-10-CM | POA: Insufficient documentation

## 2017-08-02 DIAGNOSIS — J310 Chronic rhinitis: Secondary | ICD-10-CM | POA: Diagnosis not present

## 2017-08-02 DIAGNOSIS — Z23 Encounter for immunization: Secondary | ICD-10-CM | POA: Diagnosis not present

## 2017-08-02 DIAGNOSIS — Z0000 Encounter for general adult medical examination without abnormal findings: Secondary | ICD-10-CM

## 2017-08-02 LAB — CBC WITH DIFFERENTIAL/PLATELET
Basophils Absolute: 0 10*3/uL (ref 0.0–0.1)
Basophils Relative: 0.7 % (ref 0.0–3.0)
Eosinophils Absolute: 0.1 10*3/uL (ref 0.0–0.7)
Eosinophils Relative: 2 % (ref 0.0–5.0)
HCT: 39.5 % (ref 36.0–46.0)
Hemoglobin: 13.1 g/dL (ref 12.0–15.0)
Lymphocytes Relative: 30.3 % (ref 12.0–46.0)
Lymphs Abs: 1.7 10*3/uL (ref 0.7–4.0)
MCHC: 33.1 g/dL (ref 30.0–36.0)
MCV: 95.4 fl (ref 78.0–100.0)
Monocytes Absolute: 0.5 10*3/uL (ref 0.1–1.0)
Monocytes Relative: 8 % (ref 3.0–12.0)
Neutro Abs: 3.4 10*3/uL (ref 1.4–7.7)
Neutrophils Relative %: 59 % (ref 43.0–77.0)
Platelets: 250 10*3/uL (ref 150.0–400.0)
RBC: 4.14 Mil/uL (ref 3.87–5.11)
RDW: 13 % (ref 11.5–15.5)
WBC: 5.7 10*3/uL (ref 4.0–10.5)

## 2017-08-02 LAB — COMPREHENSIVE METABOLIC PANEL
ALT: 14 U/L (ref 0–35)
AST: 16 U/L (ref 0–37)
Albumin: 4.5 g/dL (ref 3.5–5.2)
Alkaline Phosphatase: 48 U/L (ref 39–117)
BUN: 14 mg/dL (ref 6–23)
CO2: 28 mEq/L (ref 19–32)
Calcium: 9.1 mg/dL (ref 8.4–10.5)
Chloride: 103 mEq/L (ref 96–112)
Creatinine, Ser: 0.8 mg/dL (ref 0.40–1.20)
GFR: 83.58 mL/min (ref >60.00)
Glucose, Bld: 88 mg/dL (ref 70–99)
Potassium: 4.5 mEq/L (ref 3.5–5.1)
Sodium: 137 mEq/L (ref 135–145)
Total Bilirubin: 1 mg/dL (ref 0.2–1.2)
Total Protein: 6.8 g/dL (ref 6.0–8.3)

## 2017-08-02 LAB — LIPID PANEL
Cholesterol: 192 mg/dL (ref 0–200)
HDL: 68.6 mg/dL (ref >39.00)
LDL Cholesterol: 110 mg/dL — ABNORMAL HIGH (ref 0–99)
NonHDL: 123.44
Total CHOL/HDL Ratio: 3
Triglycerides: 68 mg/dL (ref 0.0–149.0)
VLDL: 13.6 mg/dL (ref 0.0–40.0)

## 2017-08-02 LAB — TSH: TSH: 1.59 u[IU]/mL (ref 0.35–4.50)

## 2017-08-02 LAB — HEMOGLOBIN A1C: Hgb A1c MFr Bld: 5.2 % (ref 4.6–6.5)

## 2017-08-02 MED ORDER — AZELASTINE HCL 0.1 % NA SOLN: 2.0000 | Freq: Two times a day (BID) | NASAL | 12 refills | Status: DC

## 2017-08-02 MED ORDER — SUVOREXANT 10 MG PO TABS: 10.0000 mg | ORAL_TABLET | Freq: Every day | ORAL | 0 refills | Status: DC

## 2017-08-02 MED ORDER — LEVOCETIRIZINE DIHYDROCHLORIDE 5 MG PO TABS: 5.0000 mg | ORAL_TABLET | Freq: Every evening | ORAL | 1 refills | Status: DC

## 2017-08-02 NOTE — Assessment & Plan Note (Signed)
Will attempt tiral of Belsomra 10 mg. Voucher given for free trial.

## 2017-08-02 NOTE — Assessment & Plan Note (Signed)
Depression screen negative. Health Maintenance reviewed -- flu shot given today. Patient has appointment scheduled with GYN. Preventive schedule discussed and handout given in AVS. Will obtain fasting labs today.

## 2017-08-02 NOTE — Progress Notes (Signed)
Patient presents to clinic today for annual exam.  Patient is fasting for labs.  Exercising 3-4 x week. Endorses well-balanced diet overall.   Acute Concerns: allergies - change meds from zyrtec Patient with seasonal allergies, currently on Zyrtec and Flonase but endorses subtherapeutic. Endorses runny nose with pnd and occasional scratchy throat. Denies chest pain, SOB.   Chronic Issues: Hypothyroidism -- Currently on levothyroxine 75 mcg daily. Is taking as directed. Is due for repeat labs.   Insomnia -- Occasional use of her Valium that she takes PRN for acute anxiety. Mildly beneficial. Will get 6 hours with medication when she takes. Would like to discuss other options.   Health Maintenance: Immunizations -- Tetanus up-to-date. Agrees to flu shot today. Mammogram -- Followed by GYN. Has appt scheduled. PAP -- Due. Has follow-up scheduled with GYN.   Past Medical History:  Diagnosis Date  . Chronic constipation   . Hypothyroidism   . Migraine   . Vitamin D deficiency     Past Surgical History:  Procedure Laterality Date  . NOSE SURGERY      Current Outpatient Prescriptions on File Prior to Visit  Medication Sig Dispense Refill  . diazepam (VALIUM) 10 MG tablet Take 1 tablet (10 mg total) by mouth every 12 (twelve) hours as needed. for anxiety 60 tablet 0  . fluticasone (FLONASE) 50 MCG/ACT nasal spray SHAKE LIQUID AND USE 2 SPRAYS IN EACH NOSTRIL DAILY 16 g 3  . levothyroxine (SYNTHROID, LEVOTHROID) 75 MCG tablet Take 1 tablet (75 mcg total) by mouth daily before breakfast. 30 tablet 0  . ondansetron (ZOFRAN-ODT) 8 MG disintegrating tablet DISSOLVE 1 TABLET(8 MG) ON THE TONGUE EVERY 8 HOURS AS NEEDED FOR NAUSEA OR VOMITING 20 tablet 0  . rizatriptan (MAXALT) 10 MG tablet TAKE 1 TABLET BY MOUTH AS NEEDED FOR MIGRAINE, MAY REPEAT IN 2 HOURS IF NEEDED 10 tablet 0   No current facility-administered medications on file prior to visit.     Allergies  Allergen Reactions    . Claritin [Loratadine]     shakiness  . Lexapro [Escitalopram] Nausea Only  . Motrin [Ibuprofen]     itching    Family History  Problem Relation Age of Onset  . Urolithiasis Father   . Cancer Mother   . Hypertension Mother   . Breast cancer Mother 70  . Breast cancer Maternal Grandmother        dx under 25  . Stomach cancer Paternal Grandmother        dx in her 68s  . Alcohol abuse Maternal Uncle   . Prostate cancer Paternal Uncle        mid 52s    Social History   Social History  . Marital status: Married    Spouse name: N/A  . Number of children: 0  . Years of education: N/A   Occupational History  . Not on file.   Social History Main Topics  . Smoking status: Never Smoker  . Smokeless tobacco: Never Used  . Alcohol use 0.0 oz/week     Comment: occasional  . Drug use: No  . Sexual activity: Yes    Partners: Male     Comment: husband with vasectomy   Other Topics Concern  . Not on file   Social History Narrative  . No narrative on file   Review of Systems  Constitutional: Positive for malaise/fatigue. Negative for fever and weight loss.  HENT: Positive for congestion and sinus pain. Negative for ear pain and  hearing loss.   Eyes: Negative for blurred vision.  Respiratory: Negative for cough.   Cardiovascular: Negative for chest pain and palpitations.  Gastrointestinal: Positive for constipation, heartburn and nausea. Negative for diarrhea and vomiting.  Genitourinary: Negative for dysuria, frequency and urgency.  Musculoskeletal: Negative for joint pain and myalgias.  Skin: Negative for rash.  Neurological: Negative for dizziness and headaches.  Psychiatric/Behavioral: Negative for depression and suicidal ideas. The patient has insomnia.    BP 108/60   Pulse 66   Temp 98.5 F (36.9 C) (Oral)   Resp 14   Ht 5\' 1"  (1.549 m)   Wt 138 lb (62.6 kg)   SpO2 100%   BMI 26.07 kg/m   Physical Exam  Constitutional: She is oriented to person, place,  and time and well-developed, well-nourished, and in no distress.  HENT:  Head: Normocephalic and atraumatic.  Right Ear: Tympanic membrane, external ear and ear canal normal.  Left Ear: Tympanic membrane, external ear and ear canal normal.  Nose: Nose normal. No mucosal edema.  Mouth/Throat: Uvula is midline, oropharynx is clear and moist and mucous membranes are normal. No oropharyngeal exudate or posterior oropharyngeal erythema.  Eyes: Pupils are equal, round, and reactive to light. Conjunctivae are normal.  Neck: Neck supple. No thyromegaly present.  Cardiovascular: Normal rate, regular rhythm, normal heart sounds and intact distal pulses.   Pulmonary/Chest: Effort normal and breath sounds normal. No respiratory distress. She has no wheezes. She has no rales. She exhibits no tenderness.  Abdominal: Soft. Bowel sounds are normal. She exhibits no distension and no mass. There is no tenderness. There is no rebound and no guarding.  Lymphadenopathy:    She has no cervical adenopathy.  Neurological: She is alert and oriented to person, place, and time. No cranial nerve deficit.  Skin: Skin is warm and dry. No rash noted.  Psychiatric: Affect normal.  Vitals reviewed.  Assessment/Plan: Hypothyroidism Will check TSH level today. Will alter regimen accordingly.  Visit for preventive health examination Depression screen negative. Health Maintenance reviewed -- flu shot given today. Patient has appointment scheduled with GYN. Preventive schedule discussed and handout given in AVS. Will obtain fasting labs today.   Primary insomnia Will attempt tiral of Belsomra 10 mg. Voucher given for free trial.  Chronic rhinitis Stop Zyrtec. Start Xyzal. Continue flonase. Astelin and saline rinses added. Follow-up if not improving.    Leeanne Rio, PA-C

## 2017-08-02 NOTE — Assessment & Plan Note (Signed)
Stop Zyrtec. Start Xyzal. Continue flonase. Astelin and saline rinses added. Follow-up if not improving.

## 2017-08-02 NOTE — Patient Instructions (Signed)
Please go to the lab for blood work.   Our office will call you with your results unless you have chosen to receive results via MyChart.  If your blood work is normal we will follow-up each year for physicals and as scheduled for chronic medical problems.  If anything is abnormal we will treat accordingly and get you in for a follow-up.  Please start the Xyzal and Astelin. Continue Flonase. Stop the Zyrtec. Start the free trial of Belsomra for sleep. Valium to be used only for acute anxiety.   I am working on getting the [place] approved for you.    Preventive Care 18-39 Years, Female Preventive care refers to lifestyle choices and visits with your health care provider that can promote health and wellness. What does preventive care include?  A yearly physical exam. This is also called an annual well check.  Dental exams once or twice a year.  Routine eye exams. Ask your health care provider how often you should have your eyes checked.  Personal lifestyle choices, including: ? Daily care of your teeth and gums. ? Regular physical activity. ? Eating a healthy diet. ? Avoiding tobacco and drug use. ? Limiting alcohol use. ? Practicing safe sex. ? Taking vitamin and mineral supplements as recommended by your health care provider. What happens during an annual well check? The services and screenings done by your health care provider during your annual well check will depend on your age, overall health, lifestyle risk factors, and family history of disease. Counseling Your health care provider may ask you questions about your:  Alcohol use.  Tobacco use.  Drug use.  Emotional well-being.  Home and relationship well-being.  Sexual activity.  Eating habits.  Work and work Statistician.  Method of birth control.  Menstrual cycle.  Pregnancy history.  Screening You may have the following tests or measurements:  Height, weight, and BMI.  Diabetes screening. This is  done by checking your blood sugar (glucose) after you have not eaten for a while (fasting).  Blood pressure.  Lipid and cholesterol levels. These may be checked every 5 years starting at age 45.  Skin check.  Hepatitis C blood test.  Hepatitis B blood test.  Sexually transmitted disease (STD) testing.  BRCA-related cancer screening. This may be done if you have a family history of breast, ovarian, tubal, or peritoneal cancers.  Pelvic exam and Pap test. This may be done every 3 years starting at age 43. Starting at age 18, this may be done every 5 years if you have a Pap test in combination with an HPV test.  Discuss your test results, treatment options, and if necessary, the need for more tests with your health care provider. Vaccines Your health care provider may recommend certain vaccines, such as:  Influenza vaccine. This is recommended every year.  Tetanus, diphtheria, and acellular pertussis (Tdap, Td) vaccine. You may need a Td booster every 10 years.  Varicella vaccine. You may need this if you have not been vaccinated.  HPV vaccine. If you are 7 or younger, you may need three doses over 6 months.  Measles, mumps, and rubella (MMR) vaccine. You may need at least one dose of MMR. You may also need a second dose.  Pneumococcal 13-valent conjugate (PCV13) vaccine. You may need this if you have certain conditions and were not previously vaccinated.  Pneumococcal polysaccharide (PPSV23) vaccine. You may need one or two doses if you smoke cigarettes or if you have certain conditions.  Meningococcal  vaccine. One dose is recommended if you are age 71-21 years and a first-year college student living in a residence hall, or if you have one of several medical conditions. You may also need additional booster doses.  Hepatitis A vaccine. You may need this if you have certain conditions or if you travel or work in places where you may be exposed to hepatitis A.  Hepatitis B  vaccine. You may need this if you have certain conditions or if you travel or work in places where you may be exposed to hepatitis B.  Haemophilus influenzae type b (Hib) vaccine. You may need this if you have certain risk factors.  Talk to your health care provider about which screenings and vaccines you need and how often you need them. This information is not intended to replace advice given to you by your health care provider. Make sure you discuss any questions you have with your health care provider. Document Released: 12/20/2001 Document Revised: 07/13/2016 Document Reviewed: 08/25/2015 Elsevier Interactive Patient Education  2017 [employer].

## 2017-08-02 NOTE — Assessment & Plan Note (Signed)
Will check TSH level today. Will alter regimen accordingly.

## 2017-08-02 NOTE — Progress Notes (Signed)
Pre visit review using our clinic review tool, if applicable. No additional management support is needed unless otherwise documented below in the visit note. 

## 2017-08-03 ENCOUNTER — Other Ambulatory Visit: Payer: Self-pay | Admitting: Physician Assistant

## 2017-08-03 ENCOUNTER — Encounter: Payer: Self-pay | Admitting: Physician Assistant

## 2017-08-03 ENCOUNTER — Other Ambulatory Visit: Payer: Self-pay | Admitting: Obstetrics & Gynecology

## 2017-08-03 DIAGNOSIS — Z1231 Encounter for screening mammogram for malignant neoplasm of breast: Secondary | ICD-10-CM

## 2017-08-03 MED ORDER — MONTELUKAST SODIUM 10 MG PO TABS: 10.0000 mg | ORAL_TABLET | Freq: Every day | ORAL | 3 refills | Status: DC

## 2017-08-03 MED ORDER — LEVOTHYROXINE SODIUM 75 MCG PO TABS: 75.0000 ug | ORAL_TABLET | Freq: Every day | ORAL | 5 refills | Status: DC

## 2017-08-11 ENCOUNTER — Encounter: Payer: Self-pay | Admitting: Physician Assistant

## 2017-08-11 DIAGNOSIS — J310 Chronic rhinitis: Secondary | ICD-10-CM

## 2017-08-14 ENCOUNTER — Other Ambulatory Visit: Payer: Self-pay | Admitting: Physician Assistant

## 2017-08-14 MED ORDER — SUVOREXANT 20 MG PO TABS: 20.0000 mg | ORAL_TABLET | Freq: Every day | ORAL | 0 refills | Status: DC

## 2017-08-18 ENCOUNTER — Other Ambulatory Visit: Payer: Self-pay | Admitting: Gynecology

## 2017-08-21 ENCOUNTER — Encounter: Payer: Federal, State, Local not specified - PPO | Admitting: Obstetrics & Gynecology

## 2017-08-22 ENCOUNTER — Ambulatory Visit (INDEPENDENT_AMBULATORY_CARE_PROVIDER_SITE_OTHER): Payer: Federal, State, Local not specified - PPO | Admitting: Obstetrics & Gynecology

## 2017-08-22 ENCOUNTER — Encounter: Payer: Self-pay | Admitting: Obstetrics & Gynecology

## 2017-08-22 VITALS — BP 118/70

## 2017-08-22 DIAGNOSIS — N766 Ulceration of vulva: Secondary | ICD-10-CM | POA: Diagnosis not present

## 2017-08-22 DIAGNOSIS — N898 Other specified noninflammatory disorders of vagina: Secondary | ICD-10-CM

## 2017-08-22 DIAGNOSIS — Z3181 Encounter for male factor infertility in female patient: Secondary | ICD-10-CM

## 2017-08-22 DIAGNOSIS — N978 Female infertility of other origin: Secondary | ICD-10-CM

## 2017-08-22 LAB — WET PREP FOR TRICH, YEAST, CLUE

## 2017-08-22 MED ORDER — VALACYCLOVIR HCL 1 G PO TABS
1000.0000 mg | ORAL_TABLET | Freq: Two times a day (BID) | ORAL | 3 refills | Status: AC
Start: 2017-08-22 — End: 2017-08-27

## 2017-08-22 NOTE — Progress Notes (Signed)
    Lisa Townsend October 20, 1976 [id number]        41 y.o.  G0  Married.  Husband has 2 children.  Vasectomy.  RP:  Vulvar bumps coming and going for many years  HPI:  C/O recurrent small lesions on anterior vulva. Burning a little, not very painful.  Husband has a long standing h/o genital herpes, very mild recurrences.  She has never seen him with lesions.  C/O mild vaginal d/c.  Would like to conceive, husband scheduled for Vasectomy reversal next month.  Past medical history,surgical history, problem list, medications, allergies, family history and social history were all reviewed and documented in the EPIC chart.  Directed ROS with pertinent positives and negatives documented in the history of present illness/assessment and plan.  Exam:  Vitals:   08/22/17 1253  BP: 118/70   General appearance:  Normal  Gyn exam:  Vulva:  Left anterior vulvar ulcers outside labia minora.  Small ulcer as well on Right anterior vulva outside labia minora.  HSV culture done.  Wet prep done in vagina.  Assessment/Plan:  41 y.o. G0P0   1. Vulvar ulcer Probable recurrent genital Herpes.  Valacyclovir treatment sent to pharmacy.  Usage reviewed.  - HSV(herpes simplex vrs) 1+2 ab-IgG - SureSwab HSV 1-2 DNA, PCR done  2. 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  Husband planning Vasectomy reversal.  Will verify Ovarian function with an AMH level today. - Anti mullerian hormone  Counseling on above issues >50% x 25 minutes.   Princess Bruins MD, 1:03 PM 08/22/2017

## 2017-08-24 ENCOUNTER — Telehealth: Payer: Self-pay | Admitting: Physician Assistant

## 2017-08-24 ENCOUNTER — Encounter: Payer: Self-pay | Admitting: *Deleted

## 2017-08-24 LAB — SURESWAB HSV, TYPE 1/2 DNA, PCR
HSV 1 DNA: NOT DETECTED
HSV 2 DNA: DETECTED — AB

## 2017-08-24 NOTE — Telephone Encounter (Signed)
I faxed documentation of recent Flu vaccine to the fax number provided.

## 2017-08-24 NOTE — Telephone Encounter (Signed)
Patient state her employer needs documentation that she received her flu vaccine this year.  Please fax documentation to [phone], Carlyon Prows.

## 2017-08-25 ENCOUNTER — Encounter: Payer: Self-pay | Admitting: *Deleted

## 2017-08-25 LAB — HSV(HERPES SIMPLEX VRS) I + II AB-IGG
HAV 1 IGG,TYPE SPECIFIC AB: 20.2 index — ABNORMAL HIGH
HSV 2 IGG,TYPE SPECIFIC AB: 5.85 index — ABNORMAL HIGH

## 2017-08-25 LAB — ANTI-MULLERIAN HORMONE (AMH), FEMALE: Anti-Mullerian Hormones(AMH), Female: 0.41 ng/mL (ref 0.01–2.99)

## 2017-08-27 NOTE — Patient Instructions (Addendum)
1. Vulvar ulcer Probable recurrent genital Herpes.  Valacyclovir treatment sent to pharmacy.  Usage reviewed.  - HSV(herpes simplex vrs) 1+2 ab-IgG - SureSwab HSV 1-2 DNA, PCR done  2. 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  Husband planning Vasectomy reversal.  Will verify Ovarian function with an AMH level today. - Anti mullerian hormone  Lisa Townsend, it was a pleasure meeting you today!  I will inform you of your results as soon as available.   Genital Herpes Genital herpes is a common sexually transmitted infection (STI) that is caused by a virus. The virus spreads from person to person through sexual contact. Infection can cause itching, blisters, and sores around the genitals or rectum. Symptoms may last several days and then go away This is called an outbreak. However, the virus remains in your body, so you may have more outbreaks in the future. The time between outbreaks varies and can be months or years. Genital herpes affects men and women. It is particularly concerning for pregnant women because the virus can be passed to the baby during delivery and can cause serious problems. Genital herpes is also a concern for people who have a weak disease-fighting (immune) system. What are the causes? This condition is caused by the herpes simplex virus (HSV) type 1 or type 2. The virus may spread through:  Sexual contact with an infected person, including vaginal, anal, and oral sex.  Contact with fluid from a herpes sore.  The skin. This means that you can get herpes from an infected partner even if he or she does not have a visible sore or does not know that he or she is infected.  What increases the risk? You are more likely to develop this condition if:  You have sex with many partners.  You do not use latex condoms during sex.  What are the signs or symptoms? Most people do not have symptoms (asymptomatic) or have mild symptoms that may be mistaken for  other skin problems. Symptoms may include:  Small red bumps near the genitals, rectum, or mouth. These bumps turn into blisters and then turn into sores.  Flu-like symptoms, including: ? Fever. ? Body aches. ? Swollen lymph nodes. ? Headache.  Painful urination.  Pain and itching in the genital area or rectal area.  Vaginal discharge.  Tingling or shooting pain in the legs and buttocks.  Generally, symptoms are more severe and last longer during the first (primary) outbreak. Flu-like symptoms are also more common during the primary outbreak. How is this diagnosed? Genital herpes may be diagnosed based on:  A physical exam.  Your medical history.  Blood tests.  A test of a fluid sample (culture) from an open sore.  How is this treated? There is no cure for this condition, but treatment with antiviral medicines that are taken by mouth (orally) can do the following:  Speed up healing and relieve symptoms.  Help to reduce the spread of the virus to sexual partners.  Limit the chance of future outbreaks, or make future outbreaks shorter.  Lessen symptoms of future outbreaks.  Your health care provider may also recommend pain relief medicines, such as aspirin or ibuprofen. Follow these instructions at home: Sexual activity  Do not have sexual contact during active outbreaks.  Practice safe sex. Latex condoms and female condoms may help prevent the spread of the herpes virus. General instructions  Keep the affected areas dry and clean.  Take over-the-counter and prescription medicines only  as told by your health care provider.  Avoid rubbing or touching blisters and sores. If you do touch blisters or sores: ? Wash your hands thoroughly with soap and water. ? Do not touch your eyes afterward.  To help relieve pain or itching, you may take the following actions as directed by your health care provider: ? Apply a cold, wet cloth (cold compress) to affected areas 4-6  times a day. ? Apply a substance that protects your skin and reduces bleeding (astringent). ? Apply a gel that helps relieve pain around sores (lidocaine gel). ? Take a warm, shallow bath that cleans the genital area (sitz bath).  Keep all follow-up visits as told by your health care provider. This is important. How is this prevented?  Use condoms. Although anyone can get genital herpes during sexual contact, even with the use of a condom, a condom can provide some protection.  Avoid having multiple sexual partners.  Talk with your sexual partner about any symptoms either of you may have. Also, talk with your partner about any history of STIs.  Get tested for STIs before you have sex. Ask your partner to do the same.  Do not have sexual contact if you have symptoms of genital herpes. Contact a health care provider if:  Your symptoms are not improving with medicine.  Your symptoms return.  You have new symptoms.  You have a fever.  You have abdominal pain.  You have redness, swelling, or pain in your eye.  You notice new sores on other parts of your body.  You are a woman and experience bleeding between menstrual periods.  You have had herpes and you become pregnant or plan to become pregnant. Summary  Genital herpes is a common sexually transmitted infection (STI) that is caused by the herpes simplex virus (HSV) type 1 or type 2.  These viruses are most often spread through sexual contact with an infected person.  You are more likely to develop this condition if you have sex with many partners or you have unprotected sex.  Most people do not have symptoms (asymptomatic) or have mild symptoms that may be mistaken for other skin problems. Symptoms occur as outbreaks that may happen months or years apart.  There is no cure for this condition, but treatment with oral antiviral medicines can reduce symptoms, reduce the chance of spreading the virus to a partner, prevent  future outbreaks, or shorten future outbreaks. This information is not intended to replace advice given to you by your health care provider. Make sure you discuss any questions you have with your health care provider. Document Released: 10/21/2000 Document Revised: 09/23/2016 Document Reviewed: 09/23/2016 Elsevier Interactive Patient Education  2017 [employer].

## 2017-08-29 ENCOUNTER — Encounter (HOSPITAL_BASED_OUTPATIENT_CLINIC_OR_DEPARTMENT_OTHER): Payer: Self-pay

## 2017-08-29 ENCOUNTER — Encounter: Payer: Self-pay | Admitting: Obstetrics & Gynecology

## 2017-08-29 ENCOUNTER — Ambulatory Visit (HOSPITAL_BASED_OUTPATIENT_CLINIC_OR_DEPARTMENT_OTHER)
Admission: RE | Admit: 2017-08-29 | Discharge: 2017-08-29 | Disposition: A | Discharge status: Home / Self Care | Payer: Federal, State, Local not specified - PPO | Source: Ambulatory Visit | Attending: Obstetrics & Gynecology | Admitting: Obstetrics & Gynecology

## 2017-08-29 ENCOUNTER — Ambulatory Visit (INDEPENDENT_AMBULATORY_CARE_PROVIDER_SITE_OTHER): Payer: Federal, State, Local not specified - PPO | Admitting: Obstetrics & Gynecology

## 2017-08-29 VITALS — BP 128/84 | Ht 60.0 in | Wt 138.0 lb

## 2017-08-29 DIAGNOSIS — Z113 Encounter for screening for infections with a predominantly sexual mode of transmission: Secondary | ICD-10-CM

## 2017-08-29 DIAGNOSIS — Z1231 Encounter for screening mammogram for malignant neoplasm of breast: Secondary | ICD-10-CM | POA: Diagnosis not present

## 2017-08-29 DIAGNOSIS — Z3181 Encounter for male factor infertility in female patient: Secondary | ICD-10-CM | POA: Diagnosis not present

## 2017-08-29 DIAGNOSIS — Z8639 Personal history of other endocrine, nutritional and metabolic disease: Secondary | ICD-10-CM | POA: Diagnosis not present

## 2017-08-29 DIAGNOSIS — Z1151 Encounter for screening for human papillomavirus (HPV): Secondary | ICD-10-CM | POA: Diagnosis not present

## 2017-08-29 DIAGNOSIS — Z01419 Encounter for gynecological examination (general) (routine) without abnormal findings: Secondary | ICD-10-CM | POA: Diagnosis not present

## 2017-08-29 DIAGNOSIS — F411 Generalized anxiety disorder: Secondary | ICD-10-CM | POA: Diagnosis not present

## 2017-08-29 DIAGNOSIS — N978 Female infertility of other origin: Secondary | ICD-10-CM

## 2017-08-29 DIAGNOSIS — J329 Chronic sinusitis, unspecified: Secondary | ICD-10-CM | POA: Diagnosis not present

## 2017-08-29 NOTE — Progress Notes (Signed)
Lisa Townsend 1976-05-01 [id number]   History:    40 y.o. G0 Married.  Vasectomy  RP:  Established patient presenting for annual gyn exam/Discuss lab results  HPI:  Menses regular normal every month.  No pelvic pain.  Normal vaginal secretions.  Recurrent UTIs, last episode E. Coli 07/2016.  No burning with miction, no frequency, no urgency currently.  BMs wnl.  Breasts wnl.  Mother deceased, Dx of breast Ca at 75.  MGM Breast Cancer Dx <50.  Vasectomy, planning reversal in 2 weeks.  Past medical history,surgical history, family history and social history were all reviewed and documented in the EPIC chart.  Gynecologic History Patient's last menstrual period was 08/11/2017. Contraception: vasectomy Last Pap: 12/2013. Results were: normal Last mammogram: 2017/09/22. Results were: Negative  Obstetric History OB History  Gravida Para Term Preterm AB Living  0         0  SAB TAB Ectopic Multiple Live Births                    ROS: A ROS was performed and pertinent positives and negatives are included in the history.  GENERAL: No fevers or chills. HEENT: No change in vision, no earache, sore throat or sinus congestion. NECK: No pain or stiffness. CARDIOVASCULAR: No chest pain or pressure. No palpitations. PULMONARY: No shortness of breath, cough or wheeze. GASTROINTESTINAL: No abdominal pain, nausea, vomiting or diarrhea, melena or bright red blood per rectum. GENITOURINARY: No urinary frequency, urgency, hesitancy or dysuria. MUSCULOSKELETAL: No joint or muscle pain, no back pain, no recent trauma. DERMATOLOGIC: No rash, no itching, no lesions. ENDOCRINE: No polyuria, polydipsia, no heat or cold intolerance. No recent change in weight. HEMATOLOGICAL: No anemia or easy bruising or bleeding. NEUROLOGIC: No headache, seizures, numbness, tingling or weakness. PSYCHIATRIC: No depression, no loss of interest in normal activity or change in sleep pattern.     Exam:   BP 128/84   Ht 5'  (1.524 m)   Wt 138 lb (62.6 kg)   LMP 08/11/2017   BMI 26.95 kg/m   Body mass index is 26.95 kg/m.  General appearance : Well developed well nourished female. No acute distress HEENT: Eyes: no retinal hemorrhage or exudates,  Neck supple, trachea midline, no carotid bruits, no thyroidmegaly Lungs: Clear to auscultation, no rhonchi or wheezes, or rib retractions  Heart: Regular rate and rhythm, no murmurs or gallops Breast:Examined in sitting and supine position were symmetrical in appearance, no palpable masses or tenderness,  no skin retraction, no nipple inversion, no nipple discharge, no skin discoloration, no axillary or supraclavicular lymphadenopathy Abdomen: no palpable masses or tenderness, no rebound or guarding Extremities: no edema or skin discoloration or tenderness  Pelvic: Vulva normal, resolved HSV ulcers.  Bartholin, Urethra, Skene Glands: Within normal limits             Vagina: No gross lesions or discharge  Cervix: No gross lesions or discharge.  Pap/HPV HR, Gono-chlam done.  Uterus  AV, normal size, shape and consistency, non-tender and mobile  Adnexa  Without masses or tenderness  Anus and perineum  normal   U/A negative  Assessment/Plan:  41 y.o. female for annual exam   1. Encounter for routine gynecological examination with Papanicolaou smear of cervix Normal gyn exam.  Pap/HPV HR done.  Breasts wnl.  Screening Mammo neg Sep 22, 2017.  Mother Deceased Breast Ca Dxed at 23 yo.  2. Screen for STD (sexually transmitted disease) Condoms recommended - Gono-Chlam on pap -  HIV antibody (with reflex) - RPR - Hepatitis B Surface AntiGEN - Hepatitis C Antibody  3. H/O vitamin D deficiency Vit D supplement per results - Vitamin D 1,25 dihydroxy  4. 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  AMA 40 yo.  HbA1C 5.2, TSH 1.59.  AMH 0.41.  Risks of sub-optimal ovulation and difficulty achieving a good response to  Ovarian stimulation Hormones discussed.  Recommended to schedule consult with Fertility for both AMA/Low [facility] and probability of a low Sperm count/motility post reversal of Vasectomy.  Counseling on above issues >50% x 15 minutes  Princess Bruins MD, 4:12 PM 08/29/2017

## 2017-08-30 ENCOUNTER — Telehealth: Payer: Self-pay | Admitting: *Deleted

## 2017-08-30 NOTE — Telephone Encounter (Signed)
Notes faxed to [place] office they will contact pt to schedule. And fax me back with time and date.

## 2017-08-30 NOTE — Telephone Encounter (Signed)
-----   Message from Ramond Craver, [place] sent at 08/29/2017  2:03 PM EDT ----- Regarding: referral to Kerin Perna Dr. Dellis Filbert replied "I recommend Fertility referral for Ovulation stimulation to improve her chances, but the low number indicates that she may not respond optimally even to Ovulation stimulation. In addition, vasectomy reversal may not give optimal Sperm count/motility. That would be another reason to consult with Fertility soon after Vasectomy reversal." Let me know if you would like our Referral Coordinator to handle referral to Dr. Kerin Perna at [facility].  Lisa Townsend, patient wants you to refer her to Dr. Kerin Perna. Thanks!!!

## 2017-08-31 LAB — PAP IG, CT-NG NAA, HPV HIGH-RISK
C. trachomatis RNA, TMA: NOT DETECTED
HPV DNA High Risk: NOT DETECTED
N. gonorrhoeae RNA, TMA: NOT DETECTED

## 2017-09-01 ENCOUNTER — Encounter: Payer: Self-pay | Admitting: Obstetrics & Gynecology

## 2017-09-02 NOTE — Patient Instructions (Signed)
1. Encounter for routine gynecological examination with Papanicolaou smear of cervix Normal gyn exam.  Pap/HPV HR done.  Breasts wnl.  Screening Mammo neg 09-05-17.  Mother Deceased Breast Ca Dxed at 42 yo.  2. Screen for STD (sexually transmitted disease) Condoms recommended - Gono-Chlam on pap - HIV antibody (with reflex) - RPR - Hepatitis B Surface AntiGEN - Hepatitis C Antibody  3. H/O vitamin D deficiency Vit D supplement per results - Vitamin D 1,25 dihydroxy  4. 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  AMA 41 yo.  HbA1C 5.2, TSH 1.59.  AMH 0.41.  Risks of sub-optimal ovulation and difficulty achieving a good response to Ovarian stimulation Hormones discussed.  Recommended to schedule consult with Fertility for both AMA/Low [facility] and probability of a low Sperm count/motility post reversal of Vasectomy.  Benjamine Mola, good to see you today!

## 2017-09-04 ENCOUNTER — Other Ambulatory Visit: Payer: Self-pay | Admitting: Obstetrics & Gynecology

## 2017-09-04 LAB — HEPATITIS C ANTIBODY
Hepatitis C Ab: NONREACTIVE
SIGNAL TO CUT-OFF: 0.02 (ref <1.00)

## 2017-09-04 LAB — VITAMIN D 1,25 DIHYDROXY
Vitamin D 1, 25 (OH)2 Total: 37 pg/mL (ref 18–72)
Vitamin D2 1, 25 (OH)2: 9 pg/mL
Vitamin D3 1, 25 (OH)2: 28 pg/mL

## 2017-09-04 LAB — HIV ANTIBODY (ROUTINE TESTING W REFLEX): HIV 1&2 Ab, 4th Generation: NONREACTIVE

## 2017-09-04 LAB — HEPATITIS B SURFACE ANTIGEN: Hepatitis B Surface Ag: NONREACTIVE

## 2017-09-04 LAB — RPR: RPR Ser Ql: NONREACTIVE

## 2017-09-04 MED ORDER — FLUCONAZOLE 150 MG PO TABS: 150.0000 mg | ORAL_TABLET | Freq: Once | ORAL | 0 refills | Status: AC

## 2017-09-05 ENCOUNTER — Encounter: Payer: Self-pay | Admitting: *Deleted

## 2017-09-08 DIAGNOSIS — F411 Generalized anxiety disorder: Secondary | ICD-10-CM | POA: Diagnosis not present

## 2017-09-10 ENCOUNTER — Encounter: Payer: Self-pay | Admitting: Physician Assistant

## 2017-09-11 ENCOUNTER — Encounter: Payer: Self-pay | Admitting: Physician Assistant

## 2017-09-11 ENCOUNTER — Ambulatory Visit: Payer: Federal, State, Local not specified - PPO | Admitting: Physician Assistant

## 2017-09-11 VITALS — BP 98/60 | HR 80 | Temp 98.6°F | Resp 14 | Ht 60.0 in | Wt 139.0 lb

## 2017-09-11 DIAGNOSIS — T50905A Adverse effect of unspecified drugs, medicaments and biological substances, initial encounter: Secondary | ICD-10-CM | POA: Diagnosis not present

## 2017-09-11 DIAGNOSIS — R21 Rash and other nonspecific skin eruption: Secondary | ICD-10-CM | POA: Diagnosis not present

## 2017-09-11 MED ORDER — METHYLPREDNISOLONE ACETATE 80 MG/ML IJ SUSP
80.0000 mg | Freq: Once | INTRAMUSCULAR | Status: AC
  Administered 2017-09-11: 80 mg via INTRAMUSCULAR

## 2017-09-11 MED ORDER — PREDNISONE 10 MG PO TABS: ORAL_TABLET | ORAL | 0 refills | Status: AC

## 2017-09-11 NOTE — Patient Instructions (Addendum)
Please stop the Clindamycin. Start the steroid taper tomorrow and take as directed. Sarna lotion will help with itch. You can get over-the-counter. Benadryl at night.   Call ENT to discuss change in antibiotics giving drug rash. Call me if symptoms are not starting to improve within 48 hours.

## 2017-09-11 NOTE — Progress Notes (Signed)
Pre visit review using our clinic review tool, if applicable. No additional management support is needed unless otherwise documented below in the visit note. 

## 2017-09-11 NOTE — Progress Notes (Signed)
Patient presents to clinic today c/o rash of arms/legs bilaterally that has now spread to torso, back and buttocks. Denies change to soaps/lotions/detergents. Denies sick contact. Denies fever, chills. Is being treated for chronic sinusitis with Clindamycin. Has been on medication for 1 week.    Past Medical History:  Diagnosis Date  . Chronic constipation   . Hypothyroidism   . Migraine   . Vitamin D deficiency     Current Outpatient Medications on File Prior to Visit  Medication Sig Dispense Refill  . clindamycin (CLEOCIN) 150 MG capsule Take 1 capsule 3 (three) times daily by mouth.  0  . diazepam (VALIUM) 10 MG tablet Take 1 tablet (10 mg total) by mouth every 12 (twelve) hours as needed. for anxiety 60 tablet 0  . fluticasone (FLONASE) 50 MCG/ACT nasal spray SHAKE LIQUID AND USE 2 SPRAYS IN EACH NOSTRIL DAILY 16 g 3  . levothyroxine (SYNTHROID, LEVOTHROID) 75 MCG tablet Take 1 tablet (75 mcg total) by mouth daily before breakfast. 30 tablet 5  . rizatriptan (MAXALT) 10 MG tablet TAKE 1 TABLET BY MOUTH AS NEEDED FOR MIGRAINE, MAY REPEAT IN 2 HOURS IF NEEDED 10 tablet 2  . Suvorexant (BELSOMRA) 20 MG TABS Take 20 mg by mouth at bedtime. (Patient not taking: Reported on 09/11/2017) 10 tablet 0   No current facility-administered medications on file prior to visit.     Allergies  Allergen Reactions  . Claritin [Loratadine]     shakiness  . Lexapro [Escitalopram] Nausea Only  . Motrin [Ibuprofen]     itching    Family History  Problem Relation Age of Onset  . Urolithiasis Father   . Cancer Mother   . Hypertension Mother   . Breast cancer Mother 3  . Breast cancer Maternal Grandmother        dx under 67  . Stomach cancer Paternal Grandmother        dx in her 60s  . Alcohol abuse Maternal Uncle   . Prostate cancer Paternal Uncle        mid 103s    Social History   Socioeconomic History  . Marital status: Married    Spouse name: None  . Number of children: 0  .  Years of education: None  . Highest education level: None  Social Needs  . Financial resource strain: None  . Food insecurity - worry: None  . Food insecurity - inability: None  . Transportation needs - medical: None  . Transportation needs - non-medical: None  Occupational History  . None  Tobacco Use  . Smoking status: Never Smoker  . Smokeless tobacco: Never Used  Substance and Sexual Activity  . Alcohol use: Yes    Alcohol/week: 0.0 oz    Comment: occasional  . Drug use: No  . Sexual activity: Yes    Partners: Male    Comment: husband with vasectomy  Other Topics Concern  . None  Social History Narrative  . None   Review of Systems - See HPI.  All other ROS are negative.  BP 98/60   Pulse 80   Temp 98.6 F (37 C) (Oral)   Resp 14   Ht 5' (1.524 m)   Wt 139 lb (63 kg)   SpO2 98%   BMI 27.15 kg/m   Physical Exam  Constitutional: She is oriented to person, place, and time and well-developed, well-nourished, and in no distress.  HENT:  Head: Normocephalic and atraumatic.  Eyes: Conjunctivae are normal.  Neck: Neck  supple.  Cardiovascular: Normal rate, regular rhythm, normal heart sounds and intact distal pulses.  Pulmonary/Chest: Effort normal and breath sounds normal. No respiratory distress. She has no wheezes. She has no rales. She exhibits no tenderness.  Neurological: She is alert and oriented to person, place, and time.  Skin: Skin is warm and dry. Rash noted. Rash is maculopapular.  Maculopapular rash of skin -- most significant on upper and lower extremities with areas of confluence. Also noted on abdomen, lower pain.   Psychiatric: Affect normal.  Vitals reviewed.   Recent Results (from the past 2160 hour(s))  CBC with Differential/Platelet     Status: None   Collection Time: 08/02/17  9:17 AM  Result Value Ref Range   WBC 5.7 4.0 - 10.5 K/uL   RBC 4.14 3.87 - 5.11 Mil/uL   Hemoglobin 13.1 12.0 - 15.0 g/dL   HCT 39.5 36.0 - 46.0 %   MCV 95.4 78.0  - 100.0 fl   MCHC 33.1 30.0 - 36.0 g/dL   RDW 13.0 11.5 - 15.5 %   Platelets 250.0 150.0 - 400.0 K/uL   Neutrophils Relative % 59.0 43.0 - 77.0 %   Lymphocytes Relative 30.3 12.0 - 46.0 %   Monocytes Relative 8.0 3.0 - 12.0 %   Eosinophils Relative 2.0 0.0 - 5.0 %   Basophils Relative 0.7 0.0 - 3.0 %   Neutro Abs 3.4 1.4 - 7.7 K/uL   Lymphs Abs 1.7 0.7 - 4.0 K/uL   Monocytes Absolute 0.5 0.1 - 1.0 K/uL   Eosinophils Absolute 0.1 0.0 - 0.7 K/uL   Basophils Absolute 0.0 0.0 - 0.1 K/uL  Comprehensive metabolic panel     Status: None   Collection Time: 08/02/17  9:17 AM  Result Value Ref Range   Sodium 137 135 - 145 mEq/L   Potassium 4.5 3.5 - 5.1 mEq/L   Chloride 103 96 - 112 mEq/L   CO2 28 19 - 32 mEq/L   Glucose, Bld 88 70 - 99 mg/dL   BUN 14 6 - 23 mg/dL   Creatinine, Ser 0.80 0.40 - 1.20 mg/dL   Total Bilirubin 1.0 0.2 - 1.2 mg/dL   Alkaline Phosphatase 48 39 - 117 U/L   AST 16 0 - 37 U/L   ALT 14 0 - 35 U/L   Total Protein 6.8 6.0 - 8.3 g/dL   Albumin 4.5 3.5 - 5.2 g/dL   Calcium 9.1 8.4 - 10.5 mg/dL   GFR 83.58 >60.00 mL/min  Lipid panel     Status: Abnormal   Collection Time: 08/02/17  9:17 AM  Result Value Ref Range   Cholesterol 192 0 - 200 mg/dL    Comment: ATP III Classification       Desirable:  < 200 mg/dL               Borderline High:  200 - 239 mg/dL          High:  > = 240 mg/dL   Triglycerides 68.0 0.0 - 149.0 mg/dL    Comment: Normal:  <150 mg/dLBorderline High:  150 - 199 mg/dL   HDL 68.60 >39.00 mg/dL   VLDL 13.6 0.0 - 40.0 mg/dL   LDL Cholesterol 110 (H) 0 - 99 mg/dL   Total CHOL/HDL Ratio 3     Comment:                Men          Women1/2 Average Risk     3.4  3.3Average Risk          5.0          4.42X Average Risk          9.6          7.13X Average Risk          15.0          11.0                       NonHDL 123.44     Comment: NOTE:  Non-HDL goal should be 30 mg/dL higher than patient's LDL goal (i.e. LDL goal of < 70 mg/dL, would have  non-HDL goal of < 100 mg/dL)  Hemoglobin A1c     Status: None   Collection Time: 08/02/17  9:17 AM  Result Value Ref Range   Hgb A1c MFr Bld 5.2 4.6 - 6.5 %    Comment: Glycemic Control Guidelines for People with Diabetes:Non Diabetic:  <6%Goal of Therapy: <7%Additional Action Suggested:  >8%   TSH     Status: None   Collection Time: 08/02/17  9:17 AM  Result Value Ref Range   TSH 1.59 0.35 - 4.50 uIU/mL  HSV(herpes simplex vrs) 1+2 ab-IgG     Status: Abnormal   Collection Time: 08/22/17  1:59 PM  Result Value Ref Range   HAV 1 IGG,TYPE SPECIFIC AB 20.20 (H) index   HSV 2 IGG,TYPE SPECIFIC AB 5.85 (H) index    Comment:                           Index          Interpretation                           -----          --------------                           <0.90          Negative                           0.90-1.09      Equivocal                           >1.09          Positive . This assay utilizes recombinant type-specific antigens to differentiate HSV-1 from HSV-2 infections. A positive result cannot distinguish between recent and past infection. If recent HSV infection is suspected but the results are negative or equivocal, the assay should be repeated in 4-6 weeks. The performance characteristics of the assay have not been established for pediatric populations, immunocompromised patients, or neonatal screening.   Anti-Mullerian Hormone [facility]), Female     Status: None   Collection Time: 08/22/17  1:59 PM  Result Value Ref Range   Anti-Mullerian Hormones(AMH), Female 0.41 0.01 - 2.99 ng/mL    Comment:     REFERENCE RANGE for AMH, FEMALE       Age            Males (ng/mL)     Females (ng/mL)      -----           ----------------- -----------------      0  -  17 Years                     Not Established      18 - 25 Years                     1.02 - 14.63      26 - 30 Years                     0.69 - 13.39      31 - 35 Years                     0.36 - 10.07      36 - 40 Years                      0.18 - 5.68      41 - 45 Years                     0.01 - 2.99 .  Richardson Chiquito has transitioned to the FDA approved [facility] assay. For physicians monitoring patients, please note that the  reference ranges have changed. A new baseline may need to be  established.   WET PREP FOR Gem, YEAST, CLUE     Status: None   Collection Time: 08/22/17  2:42 PM  Result Value Ref Range   Source: VAGINA    RESULT      Comment: EPITHELIAL CELLS-PRESENT CLUE CELLS-NONE SEEN YEAST-NONE SEEN TRICHOMONAS-NONE SEEN WBC-NONE SEEN BACTERIA-FEW EPITH. CELLS (7-12) HPF   SureSwab HSV, Type 1/2 DNA, PCR     Status: Abnormal   Collection Time: 08/22/17  2:42 PM  Result Value Ref Range   HSV 1 DNA Not Detected Not Detect   HSV 2 DNA Detected (A) Not Detect    Comment: . This test was developed and its analytical performance characteristics have been determined by [employer], [place], [place]. It has not been cleared or approved by the FDA. This assay has been validated pursuant to the CLIA regulations and is used for clinical purposes. Marland Kitchen   HIV antibody (with reflex)     Status: None   Collection Time: 08/29/17  4:37 PM  Result Value Ref Range   HIV 1&2 Ab, 4th Generation NON-REACTIVE NON-REACTI    Comment: HIV-1 antigen and HIV-1/HIV-2 antibodies were not detected. There is no laboratory evidence of HIV infection. Marland Kitchen PLEASE NOTE: This information has been disclosed to you from records whose confidentiality may be protected by state law.  If your state requires such protection, then the state law prohibits you from making any further disclosure of the information without the specific written consent of the person to whom it pertains, or as otherwise permitted by law. A general authorization for the release of medical or other information is NOT sufficient for this purpose. . For additional information please refer  to http://education.questdiagnostics.com/faq/FAQ106 (This link is being provided for informational/ educational purposes only.) . Marland Kitchen The performance of this assay has not been clinically validated in patients less than 33 years old. .   RPR     Status: None   Collection Time: 08/29/17  4:37 PM  Result Value Ref Range   RPR Ser Ql NON-REACTIVE NON-REACTI  Hepatitis B Surface AntiGEN     Status: None   Collection Time: 08/29/17  4:37 PM  Result Value Ref Range   Hepatitis B Surface  Ag NON-REACTIVE NON-REACTI  Hepatitis C Antibody     Status: None   Collection Time: 08/29/17  4:37 PM  Result Value Ref Range   Hepatitis C Ab NON-REACTIVE NON-REACTI   SIGNAL TO CUT-OFF 0.02 <1.00  Vitamin D 1,25 dihydroxy     Status: None   Collection Time: 08/29/17  4:37 PM  Result Value Ref Range   Vitamin D 1, 25 (OH)2 Total 37 18 - 72 pg/mL   Vitamin D3 1, 25 (OH)2 28 pg/mL   Vitamin D2 1, 25 (OH)2 9 pg/mL    Comment: Marland Kitchen Vitamin D3, 1,25(OH) indicates both endogenous production and supplementation. Vitamin D2, 1,25(OH)2 is an indicator of exogenous sources, such as diet or supplementation.  Interpretation and therapy are based on measurement of Vitamin D,1,25(OH)2, Total. . . This test was developed and its analytical performance characteristics have been determined by [facility], [place], [place]. It has not been cleared or approved by the FDA. This assay has been validated pursuant to the CLIA regulations and is used for clinical purposes. .   Pap IG, CT/NG NAA, and HPV (high risk)     Status: None   Collection Time: 08/29/17  4:50 PM  Result Value Ref Range   Clinical Information:      Comment: None given   LMP:      Comment: NONE GIVEN   PREV. PAP:      Comment: NONE GIVEN   PREV. BX:      Comment: NONE GIVEN   HPV DNA Probe-Source      Comment: Cervix   STATEMENT OF ADEQUACY:      Comment: Satisfactory for evaluation. Endocervical/transformation zone  component present. Age and/or menstrual status not provided    INTERPRETATION/RESULT:      Comment: Negative for intraepithelial lesion or malignancy.   Comment:      Comment: This Pap test has been evaluated with computer assisted technology.    CYTOTECHNOLOGIST:      Comment: JRW, CT(ASCP) CT screening location: [address], [address], [place], [place] [address]    HPV DNA High Risk Not Detected Not Detect    Comment: This test was performed using the APTIMA HPV Assay ([employer] . This assay detects E6/E7 viral messenger RNA (mRNA) from 14 high-risk HPV types (16,18,31,33,35,39,45,51,52,56,58,59,66,68). . The analytical performance characteristics of this assay have been determined by [facility]. The modifications have not been cleared or approved by the FDA. This assay has been validated pursuant to the CLIA regulations and is used for clinical purposes.    C. trachomatis RNA, TMA NOT DETECTED NOT DETECT   N. gonorrhoeae RNA, TMA NOT DETECTED NOT DETECT    Comment: EXPLANATORY NOTE:  . The Pap is a screening test for cervical cancer. It is  not a diagnostic test and is subject to false negative  and false positive results. It is most reliable when a  satisfactory sample, regularly obtained, is submitted  with relevant clinical findings and history, and when  the Pap result is evaluated along with historic and  current clinical information. . This test was performed using the [place] ([place] . The analytical performance characteristics of this  assay, when used to test SurePath specimens have been determined by [employer]. .     Assessment/Plan: 1. Adverse effect of drug, initial encounter Stop Clindamycin. IM depomedrol given today - 80 mg. Start taper tomorrow. Supportive measures and OTC medications today. - predniSONE (DELTASONE) 10 MG tablet; Take 4  tablets (40 mg total) daily with breakfast for 3 days by mouth,  THEN 3 tablets (30 mg total) daily with breakfast for 3 days, THEN 2 tablets (20 mg total) daily with breakfast for 3 days, THEN 1 tablet (10 mg total) daily with breakfast for 3 days.  Dispense: 30 tablet; Refill: 0  2. Rash and nonspecific skin eruption Seems consistent with drug rash. Will check Varicella titer as well.  - predniSONE (DELTASONE) 10 MG tablet; Take 4 tablets (40 mg total) daily with breakfast for 3 days by mouth, THEN 3 tablets (30 mg total) daily with breakfast for 3 days, THEN 2 tablets (20 mg total) daily with breakfast for 3 days, THEN 1 tablet (10 mg total) daily with breakfast for 3 days.  Dispense: 30 tablet; Refill: 0 - Varicella Zoster Abs, IgG/IgM - methylPREDNISolone acetate (DEPO-MEDROL) injection 80 mg   Leeanne Rio, [place]

## 2017-09-12 LAB — VARICELLA ZOSTER ABS, IGG/IGM
Varicella IgM: <0.91 index (ref 0.00–0.90)
Varicella zoster IgG: 488 index (ref >165)

## 2017-09-13 NOTE — Telephone Encounter (Signed)
Appointment 11/22/17 @ 3:15pm

## 2017-09-18 DIAGNOSIS — F411 Generalized anxiety disorder: Secondary | ICD-10-CM | POA: Diagnosis not present

## 2017-09-21 ENCOUNTER — Encounter: Payer: Self-pay | Admitting: Physician Assistant

## 2017-09-21 ENCOUNTER — Other Ambulatory Visit: Payer: Self-pay | Admitting: Physician Assistant

## 2017-10-25 DIAGNOSIS — F411 Generalized anxiety disorder: Secondary | ICD-10-CM | POA: Diagnosis not present

## 2017-11-29 ENCOUNTER — Other Ambulatory Visit: Payer: Self-pay | Admitting: Physician Assistant

## 2017-12-06 ENCOUNTER — Other Ambulatory Visit: Payer: Self-pay | Admitting: Physician Assistant

## 2017-12-06 ENCOUNTER — Encounter: Payer: Self-pay | Admitting: Physician Assistant

## 2017-12-07 ENCOUNTER — Other Ambulatory Visit: Payer: Self-pay | Admitting: Physician Assistant

## 2017-12-07 MED ORDER — ONDANSETRON 8 MG PO TBDP: 8.0000 mg | ORAL_TABLET | Freq: Three times a day (TID) | ORAL | 0 refills | Status: DC | PRN

## 2017-12-07 MED ORDER — DIAZEPAM 10 MG PO TABS: 10.0000 mg | ORAL_TABLET | Freq: Two times a day (BID) | ORAL | 0 refills | Status: DC | PRN

## 2017-12-08 ENCOUNTER — Other Ambulatory Visit: Payer: Self-pay | Admitting: Physician Assistant

## 2017-12-18 ENCOUNTER — Other Ambulatory Visit: Payer: Self-pay | Admitting: Physician Assistant

## 2018-01-15 ENCOUNTER — Encounter: Payer: Self-pay | Admitting: Physician Assistant

## 2018-01-16 DIAGNOSIS — N979 Female infertility, unspecified: Secondary | ICD-10-CM | POA: Diagnosis not present

## 2018-01-16 DIAGNOSIS — E288 Other ovarian dysfunction: Secondary | ICD-10-CM | POA: Diagnosis not present

## 2018-01-16 DIAGNOSIS — Z319 Encounter for procreative management, unspecified: Secondary | ICD-10-CM | POA: Diagnosis not present

## 2018-02-23 ENCOUNTER — Other Ambulatory Visit: Payer: Self-pay | Admitting: Physician Assistant

## 2018-02-26 ENCOUNTER — Other Ambulatory Visit: Payer: Self-pay | Admitting: Physician Assistant

## 2018-02-26 NOTE — Telephone Encounter (Signed)
Last Diazepam rx filled on 12/07/17 #60 CSC: 04/26/16 UDS: 06/06/17 Last OV: 09/11/17  Please advise

## 2018-02-28 ENCOUNTER — Encounter: Payer: Self-pay | Admitting: Emergency Medicine

## 2018-02-28 ENCOUNTER — Other Ambulatory Visit: Payer: Self-pay | Admitting: Physician Assistant

## 2018-02-28 DIAGNOSIS — Z79899 Other long term (current) drug therapy: Secondary | ICD-10-CM

## 2018-03-06 ENCOUNTER — Other Ambulatory Visit: Payer: Federal, State, Local not specified - PPO

## 2018-03-06 DIAGNOSIS — Z79899 Other long term (current) drug therapy: Secondary | ICD-10-CM

## 2018-03-09 LAB — PAIN MGMT, PROFILE 8 W/CONF, U
6 Acetylmorphine: NEGATIVE ng/mL (ref <10)
Alcohol Metabolites: NEGATIVE ng/mL (ref <500)
Alphahydroxyalprazolam: NEGATIVE ng/mL (ref <25)
Alphahydroxymidazolam: NEGATIVE ng/mL (ref <50)
Alphahydroxytriazolam: NEGATIVE ng/mL (ref <50)
Aminoclonazepam: NEGATIVE ng/mL (ref <25)
Amphetamines: NEGATIVE ng/mL (ref <500)
Benzodiazepines: POSITIVE ng/mL — AB (ref <100)
Buprenorphine, Urine: NEGATIVE ng/mL (ref <5)
Cocaine Metabolite: NEGATIVE ng/mL (ref <150)
Creatinine: 98.9 mg/dL
Hydroxyethylflurazepam: NEGATIVE ng/mL (ref <50)
Lorazepam: NEGATIVE ng/mL (ref <50)
MDMA: NEGATIVE ng/mL (ref <500)
Marijuana Metabolite: NEGATIVE ng/mL (ref <20)
Nordiazepam: 186 ng/mL — ABNORMAL HIGH (ref <50)
Opiates: NEGATIVE ng/mL (ref <100)
Oxazepam: 350 ng/mL — ABNORMAL HIGH (ref <50)
Oxidant: NEGATIVE ug/mL (ref <200)
Oxycodone: NEGATIVE ng/mL (ref <100)
Temazepam: 406 ng/mL — ABNORMAL HIGH (ref <50)
pH: 6.42 (ref 4.5–9.0)

## 2018-04-06 ENCOUNTER — Ambulatory Visit: Payer: Federal, State, Local not specified - PPO | Admitting: Obstetrics & Gynecology

## 2018-04-06 ENCOUNTER — Encounter: Payer: Self-pay | Admitting: Obstetrics & Gynecology

## 2018-04-06 VITALS — BP 114/78

## 2018-04-06 DIAGNOSIS — N3090 Cystitis, unspecified without hematuria: Secondary | ICD-10-CM | POA: Diagnosis not present

## 2018-04-06 DIAGNOSIS — R300 Dysuria: Secondary | ICD-10-CM

## 2018-04-06 MED ORDER — NITROFURANTOIN MONOHYD MACRO 100 MG PO CAPS: 100.0000 mg | ORAL_CAPSULE | Freq: Every day | ORAL | 3 refills | Status: DC | PRN

## 2018-04-06 MED ORDER — SULFAMETHOXAZOLE-TRIMETHOPRIM 800-160 MG PO TABS: 1.0000 | ORAL_TABLET | Freq: Two times a day (BID) | ORAL | 0 refills | Status: AC

## 2018-04-06 NOTE — Progress Notes (Signed)
    Lisa Townsend [birth year] [id number]        42 y.o.  G0P0   RP: Urinary frequency with dysuria worsening for 2 days  HPI: Complains of pain with urination and frequency which is getting worse in the last 2 days.  No pelvic pain.  No abnormal vaginal discharge.  No fever.  Previous history of frequent cystitis especially after intercourse.  Patient has been prescribed Macrobid prophylaxis prior to intercourse.   OB History  Gravida Para Term Preterm AB Living  0         0  SAB TAB Ectopic Multiple Live Births               Past medical history,surgical history, problem list, medications, allergies, family history and social history were all reviewed and documented in the EPIC chart.   Directed ROS with pertinent positives and negatives documented in the history of present illness/assessment and plan.  Exam:  There were no vitals filed for this visit. General appearance:  Normal  Abdomen: Normal, mildly tender at the suprapubic area.  CVAT negative bilaterally  Gynecologic exam: Deferred  U/A: Dark yellow cloudy, nitrites negative, white blood cells packed, red blood cells packed, bacteria many.  Urine culture pending.   Assessment/Plan:  42 y.o. G0P0   1. Dysuria Probable cystitis.  Urine analysis.  Urine culture pending.  2. Cystitis Decision to treat with Bactrim 1 tablet per mouth twice daily for 3 days.  Recommend good hydration with water.  Usage, risks and benefits of Bactrim reviewed.  Will inform patient of urine culture results.  A prescription of Macrobid was sent to pharmacy, 100 mg/caps take 1 capsule by mouth as needed prior to sexual activity.  Other orders - sulfamethoxazole-trimethoprim (BACTRIM DS,SEPTRA DS) 800-160 MG tablet; Take 1 tablet by mouth 2 (two) times daily for 3 days. - nitrofurantoin, macrocrystal-monohydrate, (MACROBID) 100 MG capsule; Take 1 capsule (100 mg total) by mouth daily as needed. Prophylaxis before sexual  activity  Counseling on above issues and coordination of care more than 50% for 15 minutes.  Princess Bruins MD, 10:21 AM 04/06/2018

## 2018-04-08 ENCOUNTER — Encounter: Payer: Self-pay | Admitting: Obstetrics & Gynecology

## 2018-04-08 LAB — URINALYSIS, COMPLETE W/RFL CULTURE
Bilirubin Urine: NEGATIVE
Glucose, UA: NEGATIVE
Hyaline Cast: NONE SEEN /LPF
Nitrites, Initial: NEGATIVE
Specific Gravity, Urine: 1.015 (ref 1.001–1.03)
pH: 6.5 (ref 5.0–8.0)

## 2018-04-08 LAB — CULTURE INDICATED

## 2018-04-08 LAB — URINE CULTURE
MICRO NUMBER:: 90662448
Result:: NO GROWTH
SPECIMEN QUALITY:: ADEQUATE

## 2018-04-08 NOTE — Patient Instructions (Signed)
1. Dysuria Probable cystitis.  Urine analysis.  Urine culture pending.  2. Cystitis Decision to treat with Bactrim 1 tablet per mouth twice daily for 3 days.  Recommend good hydration with water.  Usage, risks and benefits of Bactrim reviewed.  Will inform patient of urine culture results.  A prescription of Macrobid was sent to pharmacy, 100 mg/caps take 1 capsule by mouth as needed prior to sexual activity.  Other orders - sulfamethoxazole-trimethoprim (BACTRIM DS,SEPTRA DS) 800-160 MG tablet; Take 1 tablet by mouth 2 (two) times daily for 3 days. - nitrofurantoin, macrocrystal-monohydrate, (MACROBID) 100 MG capsule; Take 1 capsule (100 mg total) by mouth daily as needed. Prophylaxis before sexual activity  Benjamine Mola, good seeing you today!

## 2018-04-10 ENCOUNTER — Encounter: Payer: Self-pay | Admitting: Obstetrics & Gynecology

## 2018-04-11 ENCOUNTER — Other Ambulatory Visit: Payer: Self-pay | Admitting: Obstetrics & Gynecology

## 2018-04-11 MED ORDER — FLUCONAZOLE 150 MG PO TABS: ORAL_TABLET | ORAL | 0 refills | Status: DC

## 2018-06-01 ENCOUNTER — Other Ambulatory Visit: Payer: Self-pay | Admitting: Physician Assistant

## 2018-06-04 NOTE — Telephone Encounter (Signed)
Last OV 09/11/17, No future OV  Last filled 02/26/18, # 60 with 0 refills

## 2018-06-04 NOTE — Telephone Encounter (Signed)
To be refilled by cody. Back tomorrow.

## 2018-07-22 ENCOUNTER — Other Ambulatory Visit: Payer: Self-pay | Admitting: Physician Assistant

## 2018-07-23 ENCOUNTER — Encounter: Payer: Self-pay | Admitting: Emergency Medicine

## 2018-08-31 ENCOUNTER — Other Ambulatory Visit: Payer: Self-pay | Admitting: Physician Assistant

## 2018-09-05 ENCOUNTER — Encounter: Payer: Self-pay | Admitting: Physician Assistant

## 2018-09-05 ENCOUNTER — Ambulatory Visit (INDEPENDENT_AMBULATORY_CARE_PROVIDER_SITE_OTHER): Payer: Federal, State, Local not specified - PPO | Admitting: Physician Assistant

## 2018-09-05 ENCOUNTER — Other Ambulatory Visit: Payer: Self-pay

## 2018-09-05 VITALS — BP 110/70 | HR 65 | Temp 98.1°F | Resp 14 | Ht 60.0 in | Wt 138.0 lb

## 2018-09-05 DIAGNOSIS — E039 Hypothyroidism, unspecified: Secondary | ICD-10-CM

## 2018-09-05 DIAGNOSIS — Z1239 Encounter for other screening for malignant neoplasm of breast: Secondary | ICD-10-CM

## 2018-09-05 DIAGNOSIS — Z0000 Encounter for general adult medical examination without abnormal findings: Secondary | ICD-10-CM | POA: Diagnosis not present

## 2018-09-05 DIAGNOSIS — M7582 Other shoulder lesions, left shoulder: Secondary | ICD-10-CM | POA: Diagnosis not present

## 2018-09-05 LAB — CBC WITH DIFFERENTIAL/PLATELET
Basophils Absolute: 0 10*3/uL (ref 0.0–0.1)
Basophils Relative: 0.4 % (ref 0.0–3.0)
Eosinophils Absolute: 0.1 10*3/uL (ref 0.0–0.7)
Eosinophils Relative: 2.7 % (ref 0.0–5.0)
HCT: 37.6 % (ref 36.0–46.0)
Hemoglobin: 13.1 g/dL (ref 12.0–15.0)
Lymphocytes Relative: 35.5 % (ref 12.0–46.0)
Lymphs Abs: 1.9 10*3/uL (ref 0.7–4.0)
MCHC: 34.7 g/dL (ref 30.0–36.0)
MCV: 95.6 fl (ref 78.0–100.0)
Monocytes Absolute: 0.3 10*3/uL (ref 0.1–1.0)
Monocytes Relative: 6.3 % (ref 3.0–12.0)
Neutro Abs: 2.9 10*3/uL (ref 1.4–7.7)
Neutrophils Relative %: 55.1 % (ref 43.0–77.0)
Platelets: 272 10*3/uL (ref 150.0–400.0)
RBC: 3.94 Mil/uL (ref 3.87–5.11)
RDW: 12.8 % (ref 11.5–15.5)
WBC: 5.3 10*3/uL (ref 4.0–10.5)

## 2018-09-05 LAB — COMPREHENSIVE METABOLIC PANEL
ALT: 14 U/L (ref 0–35)
AST: 18 U/L (ref 0–37)
Albumin: 4.6 g/dL (ref 3.5–5.2)
Alkaline Phosphatase: 47 U/L (ref 39–117)
BUN: 9 mg/dL (ref 6–23)
CO2: 29 mEq/L (ref 19–32)
Calcium: 9.2 mg/dL (ref 8.4–10.5)
Chloride: 102 mEq/L (ref 96–112)
Creatinine, Ser: 0.71 mg/dL (ref 0.40–1.20)
GFR: 95.42 mL/min (ref >60.00)
Glucose, Bld: 87 mg/dL (ref 70–99)
Potassium: 4.2 mEq/L (ref 3.5–5.1)
Sodium: 139 mEq/L (ref 135–145)
Total Bilirubin: 1.1 mg/dL (ref 0.2–1.2)
Total Protein: 7 g/dL (ref 6.0–8.3)

## 2018-09-05 LAB — LIPID PANEL
Cholesterol: 179 mg/dL (ref 0–200)
HDL: 59 mg/dL (ref >39.00)
LDL Cholesterol: 98 mg/dL (ref 0–99)
NonHDL: 120.32
Total CHOL/HDL Ratio: 3
Triglycerides: 114 mg/dL (ref 0.0–149.0)
VLDL: 22.8 mg/dL (ref 0.0–40.0)

## 2018-09-05 LAB — VITAMIN D 25 HYDROXY (VIT D DEFICIENCY, FRACTURES): VITD: 28.87 ng/mL — ABNORMAL LOW (ref 30.00–100.00)

## 2018-09-05 LAB — IRON: Iron: 84 ug/dL (ref 42–145)

## 2018-09-05 LAB — TSH: TSH: 3.97 u[IU]/mL (ref 0.35–4.50)

## 2018-09-05 LAB — HEMOGLOBIN A1C: Hgb A1c MFr Bld: 5.2 % (ref 4.6–6.5)

## 2018-09-05 MED ORDER — IPRATROPIUM BROMIDE 0.03 % NA SOLN: 2.0000 | Freq: Two times a day (BID) | NASAL | 12 refills | Status: DC

## 2018-09-05 MED ORDER — LEVOTHYROXINE SODIUM 75 MCG PO TABS: ORAL_TABLET | ORAL | 1 refills | Status: DC

## 2018-09-05 MED ORDER — PREDNISONE 10 MG (21) PO TBPK: ORAL_TABLET | ORAL | 0 refills | Status: DC

## 2018-09-05 NOTE — Progress Notes (Signed)
Patient presents to clinic today for annual exam.  Patient is fasting for labs.  Acute Concerns: Patient endorses pain in L shoulder over the past couple of weeks. Notes pain with elevation of arm overhead. Denies weakness, numbness or tingling. Denies noted trauma or injury. Denies neck pain or headache.  Chronic Issues: Hypothyroidism -- Is currently on a regimen of levothyroxine 75 mcg daily. Endorses taking these medications as directed.   Health Maintenance: Immunizations -- up-to-date Mammogram -- Due for yearly screen. Order placed.  PAP -- up-to-date.   Past Medical History:  Diagnosis Date  . Chronic constipation   . Hypothyroidism   . Migraine   . Vitamin D deficiency     Past Surgical History:  Procedure Laterality Date  . BREAST BIOPSY Left   . NOSE SURGERY      Current Outpatient Medications on File Prior to Visit  Medication Sig Dispense Refill  . diazepam (VALIUM) 10 MG tablet TAKE ONE TABLET BY MOUTH EVERY 12 HOURS AS NEEDED FOR ANXIETY 60 tablet 0  . fluticasone (FLONASE) 50 MCG/ACT nasal spray SHAKE LIQUID AND USE 2 SPRAYS IN EACH NOSTRIL DAILY 16 g 3  . levothyroxine (SYNTHROID, LEVOTHROID) 75 MCG tablet TAKE 1 TABLET(75 MCG) BY MOUTH DAILY BEFORE BREAKFAST 30 tablet 5  . rizatriptan (MAXALT) 10 MG tablet TAKE ONE TABLET BY MOUTH AS NEEDED FOR MIGRAINE. MAY REPEAT IN 2 HOURS IF NEEDED 10 tablet 0   No current facility-administered medications on file prior to visit.     Allergies  Allergen Reactions  . Claritin [Loratadine]     shakiness  . Lexapro [Escitalopram] Nausea Only  . Motrin [Ibuprofen]     itching    Family History  Problem Relation Age of Onset  . Urolithiasis Father   . Cancer Mother   . Hypertension Mother   . Breast cancer Mother 61  . Breast cancer Maternal Grandmother        dx under 28  . Stomach cancer Paternal Grandmother        dx in her 46s  . Alcohol abuse Maternal Uncle   . Prostate cancer Paternal Uncle    mid 54s    Social History   Socioeconomic History  . Marital status: Married    Spouse name: Not on file  . Number of children: 0  . Years of education: Not on file  . Highest education level: Not on file  Occupational History  . Not on file  Social Needs  . Financial resource strain: Not on file  . Food insecurity:    Worry: Not on file    Inability: Not on file  . Transportation needs:    Medical: Not on file    Non-medical: Not on file  Tobacco Use  . Smoking status: Never Smoker  . Smokeless tobacco: Never Used  Substance and Sexual Activity  . Alcohol use: Yes    Alcohol/week: 0.0 standard drinks    Comment: occasional  . Drug use: No  . Sexual activity: Yes    Partners: Male    Comment: husband with vasectomy  Lifestyle  . Physical activity:    Days per week: Not on file    Minutes per session: Not on file  . Stress: Not on file  Relationships  . Social connections:    Talks on phone: Not on file    Gets together: Not on file    Attends religious service: Not on file    Active member of club  or organization: Not on file    Attends meetings of clubs or organizations: Not on file    Relationship status: Not on file  . Intimate partner violence:    Fear of current or ex partner: Not on file    Emotionally abused: Not on file    Physically abused: Not on file    Forced sexual activity: Not on file  Other Topics Concern  . Not on file  Social History Narrative  . Not on file   Review of Systems  Constitutional: Negative for fever and weight loss.  HENT: Negative for ear discharge, ear pain, hearing loss and tinnitus.   Eyes: Negative for blurred vision, double vision, photophobia and pain.  Respiratory: Negative for cough and shortness of breath.   Cardiovascular: Negative for chest pain and palpitations.  Gastrointestinal: Negative for abdominal pain, blood in stool, constipation, diarrhea, heartburn, melena, nausea and vomiting.  Genitourinary:  Negative for dysuria, flank pain, frequency, hematuria and urgency.  Musculoskeletal: Negative for falls.  Neurological: Negative for dizziness, loss of consciousness and headaches.  Endo/Heme/Allergies: Negative for environmental allergies.  Psychiatric/Behavioral: Negative for depression, hallucinations, substance abuse and suicidal ideas. The patient has insomnia. The patient is not nervous/anxious.    BP 110/70   Pulse 65   Temp 98.1 F (36.7 C) (Oral)   Resp 14   Ht 5' (1.524 m)   Wt 138 lb (62.6 kg)   SpO2 99%   BMI 26.95 kg/m   Physical Exam  Constitutional: She is oriented to person, place, and time.  HENT:  Head: Normocephalic and atraumatic.  Right Ear: Tympanic membrane, external ear and ear canal normal.  Left Ear: Tympanic membrane, external ear and ear canal normal.  Nose: Nose normal. No mucosal edema.  Mouth/Throat: Uvula is midline, oropharynx is clear and moist and mucous membranes are normal. No oropharyngeal exudate or posterior oropharyngeal erythema.  Eyes: Pupils are equal, round, and reactive to light. Conjunctivae are normal.  Neck: Neck supple. No thyromegaly present.  Cardiovascular: Normal rate, regular rhythm, normal heart sounds and intact distal pulses.  Pulmonary/Chest: Effort normal and breath sounds normal. No respiratory distress. She has no wheezes. She has no rales.  Abdominal: Soft. Bowel sounds are normal. She exhibits no distension and no mass. There is no tenderness. There is no rebound and no guarding.  Musculoskeletal:       Left shoulder: She exhibits pain (with external and internal rotation. Also with abduction > 90 deg). She exhibits normal range of motion, no tenderness, no bony tenderness, no spasm and normal strength.  Lymphadenopathy:    She has no cervical adenopathy.  Neurological: She is alert and oriented to person, place, and time. No cranial nerve deficit.  Skin: Skin is warm and dry. No rash noted.  Vitals  reviewed.  Assessment/Plan: 1. Visit for preventive health examination Depression screen negative. Health Maintenance reviewed. Preventive schedule discussed and handout given in AVS. Will obtain fasting labs today.  - CBC with Differential/Platelet - Comprehensive metabolic panel - Lipid panel - Hemoglobin A1c  2. Hypothyroidism, unspecified type Will check TSH today. Alter regimen based on levels. Follow-up 6 months for repeat assessment if levels are stable.  - TSH - Vitamin D (25 hydroxy) - Iron  3. Breast cancer screening Order for screening mammogram placed. - MM DIGITAL SCREENING BILATERAL; Future  4. Rotator cuff tendinitis, left Patient allergic to NSAIDs. Start Steroid taper. Supportive measures reviewed with patient.   Leeanne Rio, PA-C

## 2018-09-05 NOTE — Patient Instructions (Signed)
-Please go to the lab for blood work.  -Our office will call you with your results unless you have chosen to receive results via MyChart. -If your blood work is normal we will follow-up each year for physicals and as scheduled for chronic medical problems. -If anything is abnormal we will treat accordingly and get you in for a follow-up.  Start an over-the-counter Melatonin 5 mg nightly to help with sleep. Follow the sleep hygiene practices below.  Try to start some gentle back stretches at night. I recommend a yoga routine.  Make sure that you are getting a pillow that is for the type of sleeper you are mostly -- side sleeper, back sleeper, etc.  Rotate the mattress. Let me know if things are not improving.    Preventive Care 40-64 Years, Female Preventive care refers to lifestyle choices and visits with your health care provider that can promote health and wellness. What does preventive care include?  A yearly physical exam. This is also called an annual well check.  Dental exams once or twice a year.  Routine eye exams. Ask your health care provider how often you should have your eyes checked.  Personal lifestyle choices, including: ? Daily care of your teeth and gums. ? Regular physical activity. ? Eating a healthy diet. ? Avoiding tobacco and drug use. ? Limiting alcohol use. ? Practicing safe sex. ? Taking low-dose aspirin daily starting at age 70. ? Taking vitamin and mineral supplements as recommended by your health care provider. What happens during an annual well check? The services and screenings done by your health care provider during your annual well check will depend on your age, overall health, lifestyle risk factors, and family history of disease. Counseling Your health care provider may ask you questions about your:  Alcohol use.  Tobacco use.  Drug use.  Emotional well-being.  Home and relationship well-being.  Sexual activity.  Eating  habits.  Work and work Statistician.  Method of birth control.  Menstrual cycle.  Pregnancy history.  Screening You may have the following tests or measurements:  Height, weight, and BMI.  Blood pressure.  Lipid and cholesterol levels. These may be checked every 5 years, or more frequently if you are over 20 years old.  Skin check.  Lung cancer screening. You may have this screening every year starting at age 82 if you have a 30-pack-year history of smoking and currently smoke or have quit within the past 15 years.  Fecal occult blood test (FOBT) of the stool. You may have this test every year starting at age 73.  Flexible sigmoidoscopy or colonoscopy. You may have a sigmoidoscopy every 5 years or a colonoscopy every 10 years starting at age 51.  Hepatitis C blood test.  Hepatitis B blood test.  Sexually transmitted disease (STD) testing.  Diabetes screening. This is done by checking your blood sugar (glucose) after you have not eaten for a while (fasting). You may have this done every 1-3 years.  Mammogram. This may be done every 1-2 years. Talk to your health care provider about when you should start having regular mammograms. This may depend on whether you have a family history of breast cancer.  BRCA-related cancer screening. This may be done if you have a family history of breast, ovarian, tubal, or peritoneal cancers.  Pelvic exam and Pap test. This may be done every 3 years starting at age 40. Starting at age 18, this may be done every 5 years if you have  a Pap test in combination with an HPV test.  Bone density scan. This is done to screen for osteoporosis. You may have this scan if you are at high risk for osteoporosis.  Discuss your test results, treatment options, and if necessary, the need for more tests with your health care provider. Vaccines Your health care provider may recommend certain vaccines, such as:  Influenza vaccine. This is recommended every  year.  Tetanus, diphtheria, and acellular pertussis (Tdap, Td) vaccine. You may need a Td booster every 10 years.  Varicella vaccine. You may need this if you have not been vaccinated.  Zoster vaccine. You may need this after age 69.  Measles, mumps, and rubella (MMR) vaccine. You may need at least one dose of MMR if you were born in 1957 or later. You may also need a second dose.  Pneumococcal 13-valent conjugate (PCV13) vaccine. You may need this if you have certain conditions and were not previously vaccinated.  Pneumococcal polysaccharide (PPSV23) vaccine. You may need one or two doses if you smoke cigarettes or if you have certain conditions.  Meningococcal vaccine. You may need this if you have certain conditions.  Hepatitis A vaccine. You may need this if you have certain conditions or if you travel or work in places where you may be exposed to hepatitis A.  Hepatitis B vaccine. You may need this if you have certain conditions or if you travel or work in places where you may be exposed to hepatitis B.  Haemophilus influenzae type b (Hib) vaccine. You may need this if you have certain conditions.  Talk to your health care provider about which screenings and vaccines you need and how often you need them. This information is not intended to replace advice given to you by your health care provider. Make sure you discuss any questions you have with your health care provider. Document Released: 11/20/2015 Document Revised: 07/13/2016 Document Reviewed: 08/25/2015 Elsevier Interactive Patient Education  [employer].

## 2018-09-24 ENCOUNTER — Encounter: Payer: Self-pay | Admitting: Physician Assistant

## 2018-09-25 MED ORDER — DIAZEPAM 10 MG PO TABS: ORAL_TABLET | ORAL | 0 refills | Status: DC

## 2018-10-03 ENCOUNTER — Other Ambulatory Visit: Payer: Self-pay | Admitting: Physician Assistant

## 2018-10-17 ENCOUNTER — Encounter: Payer: Self-pay | Admitting: Emergency Medicine

## 2018-10-17 ENCOUNTER — Ambulatory Visit
Admission: RE | Admit: 2018-10-17 | Discharge: 2018-10-17 | Disposition: A | Discharge status: Home / Self Care | Payer: Federal, State, Local not specified - PPO | Source: Ambulatory Visit | Attending: Physician Assistant | Admitting: Physician Assistant

## 2018-10-17 DIAGNOSIS — Z1239 Encounter for other screening for malignant neoplasm of breast: Secondary | ICD-10-CM

## 2018-10-17 DIAGNOSIS — Z1231 Encounter for screening mammogram for malignant neoplasm of breast: Secondary | ICD-10-CM | POA: Diagnosis not present

## 2018-10-24 ENCOUNTER — Other Ambulatory Visit: Payer: Self-pay | Admitting: Physician Assistant

## 2018-10-25 ENCOUNTER — Other Ambulatory Visit: Payer: Self-pay | Admitting: Physician Assistant

## 2018-11-08 ENCOUNTER — Other Ambulatory Visit: Payer: Self-pay | Admitting: Physician Assistant

## 2018-11-08 NOTE — Telephone Encounter (Signed)
Rx last filled on 09/25/18 #60 LOV: 09/05/2018  Please advise

## 2018-11-23 ENCOUNTER — Encounter: Payer: Self-pay | Admitting: Physician Assistant

## 2018-12-03 ENCOUNTER — Other Ambulatory Visit: Payer: Self-pay | Admitting: Physician Assistant

## 2018-12-06 ENCOUNTER — Encounter: Payer: Self-pay | Admitting: Physician Assistant

## 2018-12-07 ENCOUNTER — Encounter: Payer: Self-pay | Admitting: Physician Assistant

## 2018-12-12 ENCOUNTER — Encounter: Payer: Self-pay | Admitting: Physician Assistant

## 2018-12-13 ENCOUNTER — Encounter: Payer: Self-pay | Admitting: Physician Assistant

## 2019-01-24 ENCOUNTER — Other Ambulatory Visit: Payer: Self-pay | Admitting: Physician Assistant

## 2019-01-24 ENCOUNTER — Other Ambulatory Visit: Payer: Self-pay | Admitting: Family Medicine

## 2019-01-24 MED ORDER — DIAZEPAM 10 MG PO TABS: ORAL_TABLET | ORAL | 0 refills | Status: DC

## 2019-01-24 MED ORDER — ONDANSETRON 8 MG PO TBDP: ORAL_TABLET | ORAL | 0 refills | Status: DC

## 2019-01-24 NOTE — Telephone Encounter (Signed)
Last OV 09/05/18 Diazepam last filled 11/08/18 #60 with 0

## 2019-03-02 ENCOUNTER — Other Ambulatory Visit: Payer: Self-pay | Admitting: Physician Assistant

## 2019-03-06 ENCOUNTER — Other Ambulatory Visit: Payer: Self-pay | Admitting: Physician Assistant

## 2019-03-06 ENCOUNTER — Encounter: Payer: Self-pay | Admitting: Physician Assistant

## 2019-03-06 MED ORDER — ONDANSETRON 8 MG PO TBDP: ORAL_TABLET | ORAL | 0 refills | Status: DC

## 2019-03-06 MED ORDER — DIAZEPAM 10 MG PO TABS: ORAL_TABLET | ORAL | 0 refills | Status: DC

## 2019-03-06 NOTE — Telephone Encounter (Signed)
Diazepam last rx 01/24/19 #60 Zofran last rx 01/24/19 #20  LOV: 09/05/18 CPE  Please advise

## 2019-04-10 DIAGNOSIS — H524 Presbyopia: Secondary | ICD-10-CM | POA: Diagnosis not present

## 2019-04-30 DIAGNOSIS — Z3169 Encounter for other general counseling and advice on procreation: Secondary | ICD-10-CM | POA: Diagnosis not present

## 2019-04-30 DIAGNOSIS — N979 Female infertility, unspecified: Secondary | ICD-10-CM | POA: Diagnosis not present

## 2019-04-30 DIAGNOSIS — Z319 Encounter for procreative management, unspecified: Secondary | ICD-10-CM | POA: Diagnosis not present

## 2019-05-14 DIAGNOSIS — Z3169 Encounter for other general counseling and advice on procreation: Secondary | ICD-10-CM | POA: Diagnosis not present

## 2019-05-14 DIAGNOSIS — Z319 Encounter for procreative management, unspecified: Secondary | ICD-10-CM | POA: Diagnosis not present

## 2019-07-30 ENCOUNTER — Encounter: Payer: Self-pay | Admitting: Gynecology

## 2019-08-29 ENCOUNTER — Encounter: Payer: Self-pay | Admitting: Emergency Medicine

## 2019-08-29 ENCOUNTER — Other Ambulatory Visit: Payer: Self-pay | Admitting: Physician Assistant

## 2019-09-03 ENCOUNTER — Other Ambulatory Visit: Payer: Self-pay

## 2019-09-03 DIAGNOSIS — Z20822 Contact with and (suspected) exposure to covid-19: Secondary | ICD-10-CM

## 2019-09-03 DIAGNOSIS — Z20828 Contact with and (suspected) exposure to other viral communicable diseases: Secondary | ICD-10-CM | POA: Diagnosis not present

## 2019-09-04 ENCOUNTER — Encounter: Payer: Self-pay | Admitting: Physician Assistant

## 2019-09-04 ENCOUNTER — Ambulatory Visit (HOSPITAL_COMMUNITY)
Admission: EM | Admit: 2019-09-04 | Discharge: 2019-09-04 | Disposition: A | Discharge status: Home / Self Care | Payer: Federal, State, Local not specified - PPO

## 2019-09-04 ENCOUNTER — Ambulatory Visit (INDEPENDENT_AMBULATORY_CARE_PROVIDER_SITE_OTHER): Payer: Federal, State, Local not specified - PPO | Admitting: Physician Assistant

## 2019-09-04 ENCOUNTER — Other Ambulatory Visit: Payer: Self-pay

## 2019-09-04 ENCOUNTER — Telehealth: Payer: Self-pay | Admitting: Physician Assistant

## 2019-09-04 ENCOUNTER — Encounter (HOSPITAL_COMMUNITY): Payer: Self-pay | Admitting: Urgent Care

## 2019-09-04 DIAGNOSIS — Z20822 Contact with and (suspected) exposure to covid-19: Secondary | ICD-10-CM

## 2019-09-04 DIAGNOSIS — Z20828 Contact with and (suspected) exposure to other viral communicable diseases: Secondary | ICD-10-CM

## 2019-09-04 DIAGNOSIS — R0602 Shortness of breath: Secondary | ICD-10-CM

## 2019-09-04 DIAGNOSIS — U071 COVID-19: Secondary | ICD-10-CM | POA: Diagnosis not present

## 2019-09-04 DIAGNOSIS — R52 Pain, unspecified: Secondary | ICD-10-CM

## 2019-09-04 MED ORDER — BENZONATATE 100 MG PO CAPS: 100.0000 mg | ORAL_CAPSULE | Freq: Three times a day (TID) | ORAL | 0 refills | Status: DC | PRN

## 2019-09-04 MED ORDER — ALBUTEROL SULFATE HFA 108 (90 BASE) MCG/ACT IN AERS: 1.0000 | INHALATION_SPRAY | Freq: Four times a day (QID) | RESPIRATORY_TRACT | 0 refills | Status: DC | PRN

## 2019-09-04 MED ORDER — PROMETHAZINE-DM 6.25-15 MG/5ML PO SYRP: 5.0000 mL | ORAL_SOLUTION | Freq: Two times a day (BID) | ORAL | 0 refills | Status: DC | PRN

## 2019-09-04 NOTE — Telephone Encounter (Signed)
Pt called in stating that she is having difficulty breathing/shortness of breath and wanted to know if something could be called in for her. She states that she went to [facility] yesterday to get COVID tested and is awaiting the results. I advised pt per Patina that if she is having difficulty breathing she needs to go to an urgent care or ER to be evaluated. We can't send in anything for the Covid because it is viral. I did offer to schedule her a VV but  I did make her aware that it would be in her benefit to go be evaluated. She states that she is not going to go to the ER and she may call back later to schedule an virtual visit.

## 2019-09-04 NOTE — Progress Notes (Signed)
Virtual Visit via Video   I connected with patient on 09/04/19 at  4:15 PM EDT by a video enabled telemedicine application and verified that I am speaking with the correct person using two identifiers.  Location patient: Home Location provider: [facility], Office Persons participating in the virtual visit: Patient, Provider, [place] (Patina Moore)  I discussed the limitations of evaluation and management by telemedicine and the availability of in person appointments. The patient expressed understanding and agreed to proceed.  Subjective:   HPI:   Patient presents via [employer] today with concerns for COVID. Has multiple + family members, including an step-father hospitalized with COVID and pneumonia and an uncle in the ER now for + COVID and worsening symptoms. Patient endorses her symptoms started last Friday -- noting congestion, aches, chills, headache, cough and SOB. SOB has been significant but stable per patient. Notes recent loss of taste and smell. Is taking Nyquil/DAyquil and Aleve. Was tested for COVID yesterday but results are pending. Was triaged when she called for appointment -- ER evaluation recommended due to breathing. She refused.   ROS:   See pertinent positives and negatives per HPI.  Patient Active Problem List   Diagnosis Date Noted  . Drug reaction 09/11/2017  . Chronic sinusitis 08/29/2017  . Chronic rhinitis 08/02/2017  . Primary insomnia 08/02/2017  . Constipation 10/13/2016  . Sciatica, right 08/02/2016  . H/O vitamin D deficiency 06/08/2016  . Visit for preventive health examination 06/18/2015  . Generalized anxiety disorder 05/21/2015  . Chronic migraine without aura without status migrainosus, not intractable 05/05/2015  . Sciatica 05/05/2015  . Hypothyroidism 01/06/2014  . Family history of breast cancer 12/27/2013  . Abdominal pain 07/26/2012  . Hypokalemia 07/26/2012    Social History   Tobacco Use  . Smoking status: Never Smoker  .  Smokeless tobacco: Never Used  Substance Use Topics  . Alcohol use: Yes    Alcohol/week: 0.0 standard drinks    Comment: occasional    Current Outpatient Medications:  .  diazepam (VALIUM) 10 MG tablet, TAKE 1 TABLET BY MOUTH EVERY 12 HOURS AS NEEDED FOR ANXIETY. Need follow-up for further refills. Schedule video visit with office., Disp: 60 tablet, Rfl: 0 .  ipratropium (ATROVENT) 0.03 % nasal spray, Place 2 sprays into both nostrils every 12 (twelve) hours., Disp: 30 mL, Rfl: 12 .  levothyroxine (SYNTHROID) 75 MCG tablet, TAKE 1 TABLET BY MOUTH DAILY BEFORE BREAKFAST. PLEASE SCHEDULE YOUR PHYSICAL, Disp: 90 tablet, Rfl: 0 .  ondansetron (ZOFRAN-ODT) 8 MG disintegrating tablet, For further refills, please schedule an appointment with provider., Disp: 20 tablet, Rfl: 0 .  rizatriptan (MAXALT) 10 MG tablet, TAKE 1 TABLET BY MOUTH AS NEEDED FOR MIGRAINE. MAY REPEAT IN 2 HOURS IF NEEDED, Disp: 10 tablet, Rfl: 3  Allergies  Allergen Reactions  . Claritin [Loratadine]     shakiness  . Lexapro [Escitalopram] Nausea Only  . Motrin [Ibuprofen]     itching    Objective:   There were no vitals taken for this visit.  Patient is well-developed, well-nourished. Resting in car.  Head is normocephalic, atraumatic.  Patient is noted to be SOB without audible wheeze. Is having to focus on breathing and becomes quite winded when trying to talk, needing to take pauses.  Speech is clear and coherent with logical content.  Patient is alert and oriented at baseline.   Assessment and Plan:   1. Suspected COVID-19 virus infection Results pending but clearly has COVID -- classic symptoms and multiple +  contacts within her family. She is audibly SOB. She is in her car at time of visit, parked.  Unable to get vitals. Initial recommendation for ER assessment. Patient refused. Discussed at least needs UC assessment for pulse ox, lung exam and likley CXR. She initially refused but then agreed to go to the  nearby Urgent Care. Will keep an eye out for records. At time of call she was parked outside of pharmacy with plans to go in and get some OTC medication. Discussed she is not to go in due to [place] and risk of exposure to others. Recommend that she go directly to UC. If she insists on getting something from pharmacy, she needs to go through drive through and wear her mask.      Leeanne Rio, PA-C 09/04/2019

## 2019-09-04 NOTE — ED Provider Notes (Signed)
MRN: [id number] DOB: Sep 06, 1976  Subjective:   Lisa Townsend is a 43 y.o. female presenting for urgent evaluation at the request of her PCP.  Has felt moderate-severe worsening malaise in the past week. Has now worsened to have difficulty breathing. Patient had a video visit given significant shortness of breath.  Has been using DayQuil and NyQuil with some relief of fevers. She has had multiple close contacts that were positive for Covid, 1 is currently hospitalized.  No current facility-administered medications for this encounter.   Current Outpatient Medications:  .  diazepam (VALIUM) 10 MG tablet, TAKE 1 TABLET BY MOUTH EVERY 12 HOURS AS NEEDED FOR ANXIETY. Need follow-up for further refills. Schedule video visit with office., Disp: 60 tablet, Rfl: 0 .  ipratropium (ATROVENT) 0.03 % nasal spray, Place 2 sprays into both nostrils every 12 (twelve) hours., Disp: 30 mL, Rfl: 12 .  levothyroxine (SYNTHROID) 75 MCG tablet, TAKE 1 TABLET BY MOUTH DAILY BEFORE BREAKFAST. PLEASE SCHEDULE YOUR PHYSICAL, Disp: 90 tablet, Rfl: 0 .  ondansetron (ZOFRAN-ODT) 8 MG disintegrating tablet, For further refills, please schedule an appointment with provider., Disp: 20 tablet, Rfl: 0 .  rizatriptan (MAXALT) 10 MG tablet, TAKE 1 TABLET BY MOUTH AS NEEDED FOR MIGRAINE. MAY REPEAT IN 2 HOURS IF NEEDED, Disp: 10 tablet, Rfl: 3    Allergies  Allergen Reactions  . Claritin [Loratadine]     shakiness  . Lexapro [Escitalopram] Nausea Only  . Motrin [Ibuprofen]     itching    Past Medical History:  Diagnosis Date  . Chronic constipation   . Hypothyroidism   . Migraine   . Vitamin D deficiency      Past Surgical History:  Procedure Laterality Date  . BREAST BIOPSY Left   . NOSE SURGERY      Social History   Tobacco Use  . Smoking status: Never Smoker  . Smokeless tobacco: Never Used  Substance Use Topics  . Alcohol use: Yes    Alcohol/week: 0.0 standard drinks    Comment: occasional  . Drug  use: No     Family History  Problem Relation Age of Onset  . Urolithiasis Father   . Cancer Mother   . Hypertension Mother   . Breast cancer Mother 27  . Breast cancer Maternal Grandmother        dx under 19  . Stomach cancer Paternal Grandmother        dx in her 29s  . Alcohol abuse Maternal Uncle   . Prostate cancer Paternal Uncle        mid 73s     Review of Systems  Constitutional: Positive for fever (intermittent) and malaise/fatigue.  HENT: Positive for sore throat (scratchy). Negative for congestion, ear pain and sinus pain.   Eyes: Negative for blurred vision, double vision, discharge and redness.  Respiratory: Positive for cough and shortness of breath. Negative for hemoptysis and wheezing.   Cardiovascular: Negative for chest pain.  Gastrointestinal: Negative for abdominal pain, diarrhea, nausea and vomiting.  Genitourinary: Negative for dysuria, flank pain and hematuria.  Musculoskeletal: Positive for myalgias.  Skin: Negative for rash.  Neurological: Negative for dizziness, weakness and headaches.  Psychiatric/Behavioral: Negative for depression and substance abuse.    Objective:   Vitals: BP 119/68 (BP Location: Left Arm)   Pulse 67   Temp 98.7 F (37.1 C) (Oral)   Resp 20   LMP 08/27/2019   SpO2 100%   Physical Exam Constitutional:      General: She  is not in acute distress.    Appearance: Normal appearance. She is well-developed. She is not ill-appearing, toxic-appearing or diaphoretic.  HENT:     Head: Normocephalic and atraumatic.     Nose: Nose normal.     Mouth/Throat:     Mouth: Mucous membranes are moist.  Eyes:     Extraocular Movements: Extraocular movements intact.     Pupils: Pupils are equal, round, and reactive to light.  Cardiovascular:     Rate and Rhythm: Normal rate and regular rhythm.     Pulses: Normal pulses.     Heart sounds: Normal heart sounds. No murmur. No friction rub. No gallop.   Pulmonary:     Effort: Pulmonary  effort is normal. No respiratory distress.     Breath sounds: Normal breath sounds. No stridor. No wheezing, rhonchi or rales.  Skin:    General: Skin is warm and dry.     Findings: No rash.  Neurological:     Mental Status: She is alert and oriented to person, place, and time.  Psychiatric:        Mood and Affect: Mood normal.        Behavior: Behavior normal.        Thought Content: Thought content normal.     Assessment and Plan :   1. COVID-19 virus infection   2. Shortness of breath   3. Close exposure to COVID-19 virus   4. Body aches     Will manage for viral illness, clinical diagnosis of COVID 19. Counseled patient on nature of COVID-19 including modes of transmission, diagnostic testing, management and supportive care.  Offered symptomatic relief. COVID 19 testing is pending. Counseled patient on potential for adverse effects with medications prescribed/recommended today, ER and return-to-clinic precautions discussed, patient verbalized understanding.     Jaynee Eagles, PA-C 09/04/19 1807

## 2019-09-04 NOTE — Progress Notes (Signed)
I have discussed the procedure for the virtual visit with the patient who has given consent to proceed with assessment and treatment.   Ramla Hase S Faisal Stradling, CMA     

## 2019-09-04 NOTE — Discharge Instructions (Addendum)
We will manage this as a viral syndrome. For sore throat or cough try using a honey-based tea. Use 3 teaspoons of honey with juice squeezed from half lemon. Place shaved pieces of ginger into 1/2-1 cup of water and warm over stove top. Then mix the ingredients and repeat every 4 hours as needed. Please take Tylenol 500mg  every 6 hours. Hydrate very well with at least 2 liters of water. Eat light meals such as soups to replenish electrolytes and soft fruits, veggies. Start an antihistamine like Zyrtec, Allegra or Claritin for postnasal drainage, sinus congestion.  You can take this together with pseudoephedrine (Sudafed) at a dose of 60 mg 3 times a day or 120 mg twice daily as needed for the same kind of congestion.

## 2019-09-04 NOTE — Telephone Encounter (Signed)
Pt called back to schedule a VV

## 2019-09-04 NOTE — ED Triage Notes (Signed)
Patient had a virtual meeting with pcp and was told to come to ucc for vital sign evaluation, specifically oxygen level.  Patient complains of sob  Patient was tested for covid

## 2019-09-05 LAB — NOVEL CORONAVIRUS, NAA: SARS-CoV-2, NAA: DETECTED — AB

## 2019-09-16 ENCOUNTER — Other Ambulatory Visit: Payer: Self-pay

## 2019-09-16 DIAGNOSIS — Z20822 Contact with and (suspected) exposure to covid-19: Secondary | ICD-10-CM

## 2019-09-17 ENCOUNTER — Encounter: Payer: Self-pay | Admitting: Physician Assistant

## 2019-09-17 ENCOUNTER — Telehealth: Payer: Self-pay | Admitting: *Deleted

## 2019-09-17 LAB — NOVEL CORONAVIRUS, NAA: SARS-CoV-2, NAA: NOT DETECTED

## 2019-09-17 NOTE — Telephone Encounter (Signed)
Patient called in and said that she is supposed to go back to work tomorrow.  She is needing a letter for work that she is cleared to come back.  She said she really needs to return to work, so she would like the letter ASAP. She would like it to be return to full duty.  She works from home, but her job is still requiring that she have a note.    Current Symptoms:  Still no taste or smell, Occasional SOB when she is up for a while moving around.

## 2019-09-17 NOTE — Telephone Encounter (Signed)
Will send work note to her MyChart. Ok for her to resume working from home.

## 2019-09-19 ENCOUNTER — Telehealth: Payer: Self-pay

## 2019-09-19 NOTE — Telephone Encounter (Signed)
Attempted to call pt. No answer, unable to leave message.  

## 2019-09-19 NOTE — Telephone Encounter (Signed)
Attempted to call pt. And review COVID 19 results. Pt.'s voice mailbox has not been set up. Will attempt to reach pt. Again.

## 2019-09-26 ENCOUNTER — Other Ambulatory Visit: Payer: Self-pay | Admitting: Physician Assistant

## 2019-10-29 ENCOUNTER — Encounter: Payer: Self-pay | Admitting: Physician Assistant

## 2019-10-29 ENCOUNTER — Other Ambulatory Visit: Payer: Self-pay | Admitting: Physician Assistant

## 2019-10-29 ENCOUNTER — Other Ambulatory Visit: Payer: Self-pay

## 2019-10-29 ENCOUNTER — Ambulatory Visit (INDEPENDENT_AMBULATORY_CARE_PROVIDER_SITE_OTHER): Payer: Federal, State, Local not specified - PPO | Admitting: Physician Assistant

## 2019-10-29 VITALS — BP 98/60 | HR 82 | Temp 98.6°F | Resp 16 | Ht 60.5 in | Wt 130.4 lb

## 2019-10-29 DIAGNOSIS — Z1231 Encounter for screening mammogram for malignant neoplasm of breast: Secondary | ICD-10-CM

## 2019-10-29 DIAGNOSIS — Z0000 Encounter for general adult medical examination without abnormal findings: Secondary | ICD-10-CM | POA: Diagnosis not present

## 2019-10-29 DIAGNOSIS — Z23 Encounter for immunization: Secondary | ICD-10-CM | POA: Diagnosis not present

## 2019-10-29 DIAGNOSIS — F5101 Primary insomnia: Secondary | ICD-10-CM

## 2019-10-29 DIAGNOSIS — F411 Generalized anxiety disorder: Secondary | ICD-10-CM | POA: Diagnosis not present

## 2019-10-29 DIAGNOSIS — E039 Hypothyroidism, unspecified: Secondary | ICD-10-CM | POA: Diagnosis not present

## 2019-10-29 DIAGNOSIS — Z8639 Personal history of other endocrine, nutritional and metabolic disease: Secondary | ICD-10-CM | POA: Diagnosis not present

## 2019-10-29 LAB — CBC WITH DIFFERENTIAL/PLATELET
Basophils Absolute: 0 10*3/uL (ref 0.0–0.1)
Basophils Relative: 0.6 % (ref 0.0–3.0)
Eosinophils Absolute: 0 10*3/uL (ref 0.0–0.7)
Eosinophils Relative: 0.7 % (ref 0.0–5.0)
HCT: 38.6 % (ref 36.0–46.0)
Hemoglobin: 13 g/dL (ref 12.0–15.0)
Lymphocytes Relative: 21.6 % (ref 12.0–46.0)
Lymphs Abs: 1.5 10*3/uL (ref 0.7–4.0)
MCHC: 33.7 g/dL (ref 30.0–36.0)
MCV: 95.5 fl (ref 78.0–100.0)
Monocytes Absolute: 0.4 10*3/uL (ref 0.1–1.0)
Monocytes Relative: 5.3 % (ref 3.0–12.0)
Neutro Abs: 4.8 10*3/uL (ref 1.4–7.7)
Neutrophils Relative %: 71.8 % (ref 43.0–77.0)
Platelets: 275 10*3/uL (ref 150.0–400.0)
RBC: 4.05 Mil/uL (ref 3.87–5.11)
RDW: 13 % (ref 11.5–15.5)
WBC: 6.7 10*3/uL (ref 4.0–10.5)

## 2019-10-29 LAB — COMPREHENSIVE METABOLIC PANEL
ALT: 10 U/L (ref 0–35)
AST: 16 U/L (ref 0–37)
Albumin: 4.4 g/dL (ref 3.5–5.2)
Alkaline Phosphatase: 43 U/L (ref 39–117)
BUN: 6 mg/dL (ref 6–23)
CO2: 25 mEq/L (ref 19–32)
Calcium: 9.1 mg/dL (ref 8.4–10.5)
Chloride: 101 mEq/L (ref 96–112)
Creatinine, Ser: 0.66 mg/dL (ref 0.40–1.20)
GFR: 97.15 mL/min (ref >60.00)
Glucose, Bld: 77 mg/dL (ref 70–99)
Potassium: 3.9 mEq/L (ref 3.5–5.1)
Sodium: 136 mEq/L (ref 135–145)
Total Bilirubin: 1.1 mg/dL (ref 0.2–1.2)
Total Protein: 6.6 g/dL (ref 6.0–8.3)

## 2019-10-29 LAB — LIPID PANEL
Cholesterol: 174 mg/dL (ref 0–200)
HDL: 44.8 mg/dL (ref >39.00)
LDL Cholesterol: 110 mg/dL — ABNORMAL HIGH (ref 0–99)
NonHDL: 128.88
Total CHOL/HDL Ratio: 4
Triglycerides: 95 mg/dL (ref 0.0–149.0)
VLDL: 19 mg/dL (ref 0.0–40.0)

## 2019-10-29 LAB — VITAMIN D 25 HYDROXY (VIT D DEFICIENCY, FRACTURES): VITD: 56.07 ng/mL (ref 30.00–100.00)

## 2019-10-29 LAB — TSH: TSH: 1.35 u[IU]/mL (ref 0.35–4.50)

## 2019-10-29 MED ORDER — HYDROXYZINE HCL 50 MG PO TABS: 50.0000 mg | ORAL_TABLET | Freq: Every evening | ORAL | 0 refills | Status: DC | PRN

## 2019-10-29 MED ORDER — CYCLOBENZAPRINE HCL 5 MG PO TABS: 5.0000 mg | ORAL_TABLET | Freq: Three times a day (TID) | ORAL | 0 refills | Status: DC | PRN

## 2019-10-29 MED ORDER — RIZATRIPTAN BENZOATE 10 MG PO TABS: ORAL_TABLET | ORAL | 3 refills | Status: DC

## 2019-10-29 MED ORDER — DIAZEPAM 10 MG PO TABS: ORAL_TABLET | ORAL | 0 refills | Status: DC

## 2019-10-29 NOTE — Patient Instructions (Signed)
Please go to the lab for blood work.   Our office will call you with your results unless you have chosen to receive results via MyChart.  If your blood work is normal we will follow-up each year for physicals and as scheduled for chronic medical problems.  If anything is abnormal we will treat accordingly and get you in for a follow-up.  Use the muscle relaxant only on rare, as needed basis.  Try to start some gentle stretching (yoga or tai chi) at home.   The Diazepam is only for severe acute anxiety -- to be used as a Data processing manager.  The Hydroxyzine is to use at night when needed for sleep.  Let me know how this works.   Preventive Care 13-61 Years Old, Female Preventive care refers to visits with your health care provider and lifestyle choices that can promote health and wellness. This includes:  A yearly physical exam. This may also be called an annual well check.  Regular dental visits and eye exams.  Immunizations.  Screening for certain conditions.  Healthy lifestyle choices, such as eating a healthy diet, getting regular exercise, not using drugs or products that contain nicotine and tobacco, and limiting alcohol use. What can I expect for my preventive care visit? Physical exam Your health care provider will check your:  Height and weight. This may be used to calculate body mass index (BMI), which tells if you are at a healthy weight.  Heart rate and blood pressure.  Skin for abnormal spots. Counseling Your health care provider may ask you questions about your:  Alcohol, tobacco, and drug use.  Emotional well-being.  Home and relationship well-being.  Sexual activity.  Eating habits.  Work and work Statistician.  Method of birth control.  Menstrual cycle.  Pregnancy history. What immunizations do I need?  Influenza (flu) vaccine  This is recommended every year. Tetanus, diphtheria, and pertussis (Tdap) vaccine  You may need a Td booster  every 10 years. Varicella (chickenpox) vaccine  You may need this if you have not been vaccinated. Zoster (shingles) vaccine  You may need this after age 39. Measles, mumps, and rubella (MMR) vaccine  You may need at least one dose of MMR if you were born in 1957 or later. You may also need a second dose. Pneumococcal conjugate (PCV13) vaccine  You may need this if you have certain conditions and were not previously vaccinated. Pneumococcal polysaccharide (PPSV23) vaccine  You may need one or two doses if you smoke cigarettes or if you have certain conditions. Meningococcal conjugate (MenACWY) vaccine  You may need this if you have certain conditions. Hepatitis A vaccine  You may need this if you have certain conditions or if you travel or work in places where you may be exposed to hepatitis A. Hepatitis B vaccine  You may need this if you have certain conditions or if you travel or work in places where you may be exposed to hepatitis B. Haemophilus influenzae type b (Hib) vaccine  You may need this if you have certain conditions. Human papillomavirus (HPV) vaccine  If recommended by your health care provider, you may need three doses over 6 months. You may receive vaccines as individual doses or as more than one vaccine together in one shot (combination vaccines). Talk with your health care provider about the risks and benefits of combination vaccines. What tests do I need? Blood tests  Lipid and cholesterol levels. These may be checked every 5 years, or more frequently  if you are over 32 years old.  Hepatitis C test.  Hepatitis B test. Screening  Lung cancer screening. You may have this screening every year starting at age 39 if you have a 30-pack-year history of smoking and currently smoke or have quit within the past 15 years.  Colorectal cancer screening. All adults should have this screening starting at age 29 and continuing until age 53. Your health care provider  may recommend screening at age 99 if you are at increased risk. You will have tests every 1-10 years, depending on your results and the type of screening test.  Diabetes screening. This is done by checking your blood sugar (glucose) after you have not eaten for a while (fasting). You may have this done every 1-3 years.  Mammogram. This may be done every 1-2 years. Talk with your health care provider about when you should start having regular mammograms. This may depend on whether you have a family history of breast cancer.  BRCA-related cancer screening. This may be done if you have a family history of breast, ovarian, tubal, or peritoneal cancers.  Pelvic exam and Pap test. This may be done every 3 years starting at age 61. Starting at age 1, this may be done every 5 years if you have a Pap test in combination with an HPV test. Other tests  Sexually transmitted disease (STD) testing.  Bone density scan. This is done to screen for osteoporosis. You may have this scan if you are at high risk for osteoporosis. Follow these instructions at home: Eating and drinking  Eat a diet that includes fresh fruits and vegetables, whole grains, lean protein, and low-fat dairy.  Take vitamin and mineral supplements as recommended by your health care provider.  Do not drink alcohol if: ? Your health care provider tells you not to drink. ? You are pregnant, may be pregnant, or are planning to become pregnant.  If you drink alcohol: ? Limit how much you have to 0-1 drink a day. ? Be aware of how much alcohol is in your drink. In the [place] one drink equals one 12 oz bottle of beer (355 mL), one 5 oz glass of wine (148 mL), or one 1 oz glass of hard liquor (44 mL). Lifestyle  Take daily care of your teeth and gums.  Stay active. Exercise for at least 30 minutes on 5 or more days each week.  Do not use any products that contain nicotine or tobacco, such as cigarettes, e-cigarettes, and chewing tobacco.  If you need help quitting, ask your health care provider.  If you are sexually active, practice safe sex. Use a condom or other form of birth control (contraception) in order to prevent pregnancy and STIs (sexually transmitted infections).  If told by your health care provider, take low-dose aspirin daily starting at age 77. What's next?  Visit your health care provider once a year for a well check visit.  Ask your health care provider how often you should have your eyes and teeth checked.  Stay up to date on all vaccines. This information is not intended to replace advice given to you by your health care provider. Make sure you discuss any questions you have with your health care provider. Document Released: 11/20/2015 Document Revised: 07/05/2018 Document Reviewed: 07/05/2018 Elsevier Patient Education  2020 [employer].

## 2019-10-29 NOTE — Progress Notes (Signed)
Patient presents to clinic today for annual exam.  Patient is fasting for labs.  Acute Concerns: Patient with history of vitamin D deficiency.  Is wanting to make sure her levels are rechecked.  Is currently maintained with OTC vitamin D3.  She is unsure of her dose.  Patient also with history of insomnia, intermittent.  Has had issue over the past couple weeks.  Tried OTC melatonin without improvement in sleep.  Has been taking her Valium nightly which does help.  Very rare use of Valium for acute anxiety.  Chronic Issues: Hypothyroidism --patient is currently on a regimen of levothyroxine 75 mcg daily.  Endorses taking daily as directed.  Feels well on this regimen.  Migraine headache --patient endorses 1-2 migraines per month.  Not worsening in severity either.  Is controlled with Maxalt as needed.  Tries to keep hydrated and keep a well-balanced diet.  Health Maintenance: Immunizations --agrees to flu shot today. TDaP up-to-date. Mammogram -- Due for repeat.  Denies concerns today.  Mammogram to be ordered.  PAP -- UTD.  Followed by GYN.  Past Medical History:  Diagnosis Date  . Chronic constipation   . Hypothyroidism   . Migraine   . Vitamin D deficiency     Past Surgical History:  Procedure Laterality Date  . BREAST BIOPSY Left   . NOSE SURGERY      Current Outpatient Medications on File Prior to Visit  Medication Sig Dispense Refill  . albuterol (VENTOLIN HFA) 108 (90 Base) MCG/ACT inhaler Inhale 1-2 puffs into the lungs every 6 (six) hours as needed for wheezing or shortness of breath. 18 g 0  . cetirizine (ZYRTEC) 10 MG tablet Take 10 mg by mouth daily.    . diazepam (VALIUM) 10 MG tablet TAKE 1 TABLET BY MOUTH EVERY 12 HOURS AS NEEDED FOR ANXIETY. Need follow-up for further refills. Schedule video visit with office. 60 tablet 0  . levothyroxine (SYNTHROID) 75 MCG tablet TAKE 1 TABLET BY MOUTH DAILY BEFORE BREAKFAST. PLEASE SCHEDULE YOUR PHYSICAL 90 tablet 0  .  naproxen sodium (ALEVE) 220 MG tablet Take 220 mg by mouth.    . ondansetron (ZOFRAN-ODT) 8 MG disintegrating tablet For further refills, please schedule an appointment with provider. 20 tablet 0  . rizatriptan (MAXALT) 10 MG tablet TAKE 1 TABLET BY MOUTH AS NEEDED FOR MIGRAINE. MAY REPEAT IN 2 HOURS IF NEEDED 10 tablet 3   No current facility-administered medications on file prior to visit.    Allergies  Allergen Reactions  . Claritin [Loratadine]     shakiness  . Lexapro [Escitalopram] Nausea Only    Family History  Problem Relation Age of Onset  . Urolithiasis Father   . Cancer Mother   . Hypertension Mother   . Breast cancer Mother 53  . Breast cancer Maternal Grandmother        dx under 34  . Stomach cancer Paternal Grandmother        dx in her 72s  . Alcohol abuse Maternal Uncle   . Prostate cancer Paternal Uncle        mid 81s    Social History   Socioeconomic History  . Marital status: Married    Spouse name: Not on file  . Number of children: 0  . Years of education: Not on file  . Highest education level: Not on file  Occupational History  . Not on file  Tobacco Use  . Smoking status: Never Smoker  . Smokeless tobacco: Never Used  Substance and Sexual Activity  . Alcohol use: Yes    Alcohol/week: 0.0 standard drinks    Comment: occasional  . Drug use: No  . Sexual activity: Yes    Partners: Male    Comment: husband with vasectomy  Other Topics Concern  . Not on file  Social History Narrative  . Not on file   Social Determinants of Health   Financial Resource Strain:   . Difficulty of Paying Living Expenses: Not on file  Food Insecurity:   . Worried About Charity fundraiser in the Last Year: Not on file  . Ran Out of Food in the Last Year: Not on file  Transportation Needs:   . Lack of Transportation (Medical): Not on file  . Lack of Transportation (Non-Medical): Not on file  Physical Activity:   . Days of Exercise per Week: Not on file  .  Minutes of Exercise per Session: Not on file  Stress:   . Feeling of Stress : Not on file  Social Connections:   . Frequency of Communication with Friends and Family: Not on file  . Frequency of Social Gatherings with Friends and Family: Not on file  . Attends Religious Services: Not on file  . Active Member of Clubs or Organizations: Not on file  . Attends Archivist Meetings: Not on file  . Marital Status: Not on file  Intimate Partner Violence:   . Fear of Current or Ex-Partner: Not on file  . Emotionally Abused: Not on file  . Physically Abused: Not on file  . Sexually Abused: Not on file   Review of Systems  Constitutional: Negative for fever and weight loss.  HENT: Negative for ear discharge, ear pain, hearing loss and tinnitus.   Eyes: Negative for blurred vision, double vision, photophobia and pain.  Respiratory: Negative for cough and shortness of breath.   Cardiovascular: Negative for chest pain and palpitations.  Gastrointestinal: Negative for abdominal pain, blood in stool, constipation, diarrhea, heartburn, melena, nausea and vomiting.  Genitourinary: Negative for dysuria, flank pain, frequency, hematuria and urgency.  Musculoskeletal: Negative for falls.  Neurological: Negative for dizziness, loss of consciousness and headaches.  Endo/Heme/Allergies: Negative for environmental allergies.  Psychiatric/Behavioral: Negative for depression, hallucinations, substance abuse and suicidal ideas. The patient is nervous/anxious and has insomnia.    BP 98/60   Pulse 82   Temp 98.6 F (37 C) (Temporal)   Resp 16   Ht 5' 0.5" (1.537 m)   Wt 130 lb 6.4 oz (59.1 kg)   LMP 10/20/2019 (Exact Date)   SpO2 99%   BMI 25.05 kg/m   Physical Exam Vitals reviewed.  HENT:     Head: Normocephalic and atraumatic.     Right Ear: Tympanic membrane, ear canal and external ear normal.     Left Ear: Tympanic membrane, ear canal and external ear normal.     Nose: Nose normal. No  mucosal edema.     Mouth/Throat:     Pharynx: Uvula midline. No oropharyngeal exudate or posterior oropharyngeal erythema.  Eyes:     Conjunctiva/sclera: Conjunctivae normal.     Pupils: Pupils are equal, round, and reactive to light.  Neck:     Thyroid: No thyromegaly.  Cardiovascular:     Rate and Rhythm: Normal rate and regular rhythm.     Heart sounds: Normal heart sounds.  Pulmonary:     Effort: Pulmonary effort is normal. No respiratory distress.     Breath sounds: Normal breath sounds. No  wheezing or rales.  Abdominal:     General: Bowel sounds are normal. There is no distension.     Palpations: Abdomen is soft. There is no mass.     Tenderness: There is no abdominal tenderness. There is no guarding or rebound.  Musculoskeletal:     Cervical back: Neck supple.  Lymphadenopathy:     Cervical: No cervical adenopathy.  Skin:    General: Skin is warm and dry.     Findings: No rash.  Neurological:     Mental Status: She is alert and oriented to person, place, and time.     Cranial Nerves: No cranial nerve deficit.     Recent Results (from the past 2160 hour(s))  Novel Coronavirus, NAA ([facility])     Status: Abnormal   Collection Time: 09/03/19 12:00 AM   Specimen: Nasopharyngeal(NP) swabs in vial transport medium   NASOPHARYNGE  TESTING  Result Value Ref Range   SARS-CoV-2, NAA Detected (A) Not Detected    Comment: This nucleic acid amplification test was developed and its performance characteristics determined by [employer]. Nucleic acid amplification tests include PCR and TMA. This test has not been FDA cleared or approved. This test has been authorized by FDA under an Emergency Use Authorization (EUA). This test is only authorized for the duration of time the declaration that circumstances exist justifying the authorization of the emergency use of in vitro diagnostic tests for detection of SARS-CoV-2 virus and/or diagnosis of COVID-19 infection under  section 564(b)(1) of the Act, 21 U.S.C. 867YPP-5(K) (1), unless the authorization is terminated or revoked sooner. When diagnostic testing is negative, the possibility of a false negative result should be considered in the context of a patient's recent exposures and the presence of clinical signs and symptoms consistent with COVID-19. An individual without symptoms of COVID-19 and who is not shedding SARS-CoV-2 virus would  expect to have a negative (not detected) result in this assay.   Novel Coronavirus, NAA ([facility])     Status: None   Collection Time: 09/16/19 12:00 AM   Specimen: Nasopharyngeal(NP) swabs in vial transport medium   NASOPHARYNGE  TESTING  Result Value Ref Range   SARS-CoV-2, NAA Not Detected Not Detected    Comment: Testing was performed using the cobas(R) SARS-CoV-2 test. This nucleic acid amplification test was developed and its performance characteristics determined by [employer]. Nucleic acid amplification tests include PCR and TMA. This test has not been FDA cleared or approved. This test has been authorized by FDA under an Emergency Use Authorization (EUA). This test is only authorized for the duration of time the declaration that circumstances exist justifying the authorization of the emergency use of in vitro diagnostic tests for detection of SARS-CoV-2 virus and/or diagnosis of COVID-19 infection under section 564(b)(1) of the Act, 21 U.S.C. 932IZT-2(W) (1), unless the authorization is terminated or revoked sooner. When diagnostic testing is negative, the possibility of a false negative result should be considered in the context of a patient's recent exposures and the presence of clinical signs and symptoms consistent with COVID-19. An individual without symptoms  of COVID-19 and who is not shedding SARS-CoV-2 virus would expect to have a negative (not detected) result in this assay.    Assessment/Plan: 1. Visit for preventive health  examination Depression screen negative. Health Maintenance reviewed.  Flu shot updated today. Preventive schedule discussed and handout given in AVS. Will obtain fasting labs today. - Comp Met (CMET) - Lipid Profile - Vitamin D (25 hydroxy) - Iron,  TIBC and Ferritin Panel - CBC w/Diff  2. Encounter for screening mammogram for malignant neoplasm of breast Order for screening mammogram placed.  Follow-up with GYN for breast exam. - Screening Mammogram; Future  3. Generalized anxiety disorder Prior history.  Doing very well overall.  Now with just episodes of more acute anxiety for which she takes Valium as needed.  We will continue current regimen.  Will monitor closely.  If deteriorating will need to consider restarting daily SSRI/SNRI.  4. H/O vitamin D deficiency Recheck vitamin D level today to further assess. - Vitamin D (25 hydroxy)  5. Primary insomnia Intermittent.  More issue with sleep onset.  Seems to be related to work stressors.  Melatonin subtherapeutic.  Want her to avoid use of benzodiazepine for sleep to prevent habit formation.  We will have her start hydroxyzine as needed at night to help with sleep.  She is to let us know how this is working.  6. Hypothyroidism, unspecified type Repeat thyroid levels today.  Will alter according to results. - Lipid Profile - TSH   Leeanne Rio, PA-C

## 2019-10-30 ENCOUNTER — Other Ambulatory Visit: Payer: Self-pay | Admitting: Physician Assistant

## 2019-10-30 LAB — IRON,TIBC AND FERRITIN PANEL
%SAT: 43 % (calc) (ref 16–45)
Ferritin: 99 ng/mL (ref 16–232)
Iron: 116 ug/dL (ref 40–190)
TIBC: 270 mcg/dL (calc) (ref 250–450)

## 2019-10-30 MED ORDER — LEVOTHYROXINE SODIUM 75 MCG PO TABS: ORAL_TABLET | ORAL | 1 refills | Status: DC

## 2019-11-14 ENCOUNTER — Encounter: Payer: Self-pay | Admitting: Physician Assistant

## 2019-11-25 ENCOUNTER — Encounter: Payer: Self-pay | Admitting: Physician Assistant

## 2019-12-12 ENCOUNTER — Ambulatory Visit: Payer: Federal, State, Local not specified - PPO

## 2020-01-02 ENCOUNTER — Other Ambulatory Visit: Payer: Self-pay

## 2020-01-02 NOTE — Telephone Encounter (Signed)
Last refill: 12.22.20#60, 0  Last OV: 12.22.20 dx. CPE

## 2020-01-03 MED ORDER — DIAZEPAM 10 MG PO TABS: ORAL_TABLET | ORAL | 0 refills | Status: DC

## 2020-01-07 ENCOUNTER — Other Ambulatory Visit: Payer: Self-pay | Admitting: Physician Assistant

## 2020-01-07 NOTE — Telephone Encounter (Signed)
Last OV 10/29/19 Valium last filled 01/03/20 #60 with 0

## 2020-01-09 ENCOUNTER — Ambulatory Visit: Payer: Federal, State, Local not specified - PPO

## 2020-01-11 ENCOUNTER — Other Ambulatory Visit: Payer: Self-pay | Admitting: Physician Assistant

## 2020-01-13 MED ORDER — CYCLOBENZAPRINE HCL 5 MG PO TABS: 5.0000 mg | ORAL_TABLET | Freq: Three times a day (TID) | ORAL | 0 refills | Status: DC | PRN

## 2020-01-14 ENCOUNTER — Ambulatory Visit
Admission: RE | Admit: 2020-01-14 | Discharge: 2020-01-14 | Disposition: A | Discharge status: Home / Self Care | Payer: Federal, State, Local not specified - PPO | Source: Ambulatory Visit | Attending: Physician Assistant | Admitting: Physician Assistant

## 2020-01-14 ENCOUNTER — Other Ambulatory Visit: Payer: Self-pay

## 2020-01-14 DIAGNOSIS — Z1231 Encounter for screening mammogram for malignant neoplasm of breast: Secondary | ICD-10-CM | POA: Diagnosis not present

## 2020-01-15 ENCOUNTER — Other Ambulatory Visit: Payer: Self-pay | Admitting: Physician Assistant

## 2020-01-15 DIAGNOSIS — R928 Other abnormal and inconclusive findings on diagnostic imaging of breast: Secondary | ICD-10-CM

## 2020-01-18 DIAGNOSIS — Z23 Encounter for immunization: Secondary | ICD-10-CM | POA: Diagnosis not present

## 2020-01-20 ENCOUNTER — Other Ambulatory Visit: Payer: Self-pay | Admitting: Physician Assistant

## 2020-01-20 ENCOUNTER — Other Ambulatory Visit: Payer: Self-pay

## 2020-01-20 ENCOUNTER — Ambulatory Visit
Admission: RE | Admit: 2020-01-20 | Discharge: 2020-01-20 | Disposition: A | Discharge status: Home / Self Care | Payer: Federal, State, Local not specified - PPO | Source: Ambulatory Visit | Attending: Physician Assistant | Admitting: Physician Assistant

## 2020-01-20 DIAGNOSIS — R928 Other abnormal and inconclusive findings on diagnostic imaging of breast: Secondary | ICD-10-CM

## 2020-01-20 DIAGNOSIS — N6489 Other specified disorders of breast: Secondary | ICD-10-CM | POA: Diagnosis not present

## 2020-01-20 DIAGNOSIS — R922 Inconclusive mammogram: Secondary | ICD-10-CM | POA: Diagnosis not present

## 2020-02-08 ENCOUNTER — Encounter: Payer: Self-pay | Admitting: Physician Assistant

## 2020-02-08 DIAGNOSIS — R439 Unspecified disturbances of smell and taste: Secondary | ICD-10-CM

## 2020-02-10 ENCOUNTER — Encounter: Payer: Self-pay | Admitting: Physician Assistant

## 2020-02-10 ENCOUNTER — Telehealth: Payer: Self-pay

## 2020-02-10 NOTE — Telephone Encounter (Signed)
Patient is scheduled for 02/11/20.

## 2020-02-10 NOTE — Telephone Encounter (Signed)
She would need video appointment with me or at least an e-visit

## 2020-02-10 NOTE — Telephone Encounter (Signed)
Patient would like to get rx for fluconazole called into her pharmacy. Patient states she called her GYN but they could not get her in until June. Patient sates she is having itching.

## 2020-02-10 NOTE — Telephone Encounter (Signed)
Patient sent MyChart message as well. She will need appt for this

## 2020-02-10 NOTE — Telephone Encounter (Signed)
Tried to call patient to schedule an appointment but her mailbox was full.

## 2020-02-11 ENCOUNTER — Telehealth: Payer: Federal, State, Local not specified - PPO | Admitting: Physician Assistant

## 2020-02-11 ENCOUNTER — Other Ambulatory Visit: Payer: Self-pay

## 2020-02-11 ENCOUNTER — Ambulatory Visit: Payer: Federal, State, Local not specified - PPO | Admitting: Women's Health

## 2020-02-11 ENCOUNTER — Encounter: Payer: Self-pay | Admitting: Women's Health

## 2020-02-11 VITALS — BP 110/78

## 2020-02-11 DIAGNOSIS — N3001 Acute cystitis with hematuria: Secondary | ICD-10-CM

## 2020-02-11 DIAGNOSIS — R300 Dysuria: Secondary | ICD-10-CM | POA: Diagnosis not present

## 2020-02-11 DIAGNOSIS — B3731 Acute candidiasis of vulva and vagina: Secondary | ICD-10-CM

## 2020-02-11 DIAGNOSIS — B373 Candidiasis of vulva and vagina: Secondary | ICD-10-CM

## 2020-02-11 LAB — WET PREP FOR TRICH, YEAST, CLUE

## 2020-02-11 MED ORDER — NITROFURANTOIN MACROCRYSTAL 50 MG PO CAPS: ORAL_CAPSULE | ORAL | 1 refills | Status: DC

## 2020-02-11 MED ORDER — FLUCONAZOLE 150 MG PO TABS: 150.0000 mg | ORAL_TABLET | Freq: Once | ORAL | 1 refills | Status: AC

## 2020-02-11 MED ORDER — SULFAMETHOXAZOLE-TRIMETHOPRIM 800-160 MG PO TABS: 1.0000 | ORAL_TABLET | Freq: Two times a day (BID) | ORAL | 0 refills | Status: DC

## 2020-02-11 NOTE — Patient Instructions (Addendum)
Intercourse every other day, day 7-21 Day 1 first day of cycle  Urinary Tract Infection, Adult  A urinary tract infection (UTI) is an infection of any part of the urinary tract. The urinary tract includes the kidneys, ureters, bladder, and urethra. These organs make, store, and get rid of urine in the body. Your health care provider may use other names to describe the infection. An upper UTI affects the ureters and kidneys (pyelonephritis). A lower UTI affects the bladder (cystitis) and urethra (urethritis). What are the causes? Most urinary tract infections are caused by bacteria in your genital area, around the entrance to your urinary tract (urethra). These bacteria grow and cause inflammation of your urinary tract. What increases the risk? You are more likely to develop this condition if:  You have a urinary catheter that stays in place (indwelling).  You are not able to control when you urinate or have a bowel movement (you have incontinence).  You are female and you: ? Use a spermicide or diaphragm for birth control. ? Have low estrogen levels. ? Are pregnant.  You have certain genes that increase your risk (genetics).  You are sexually active.  You take antibiotic medicines.  You have a condition that causes your flow of urine to slow down, such as: ? An enlarged prostate, if you are female. ? Blockage in your urethra (stricture). ? A kidney stone. ? A nerve condition that affects your bladder control (neurogenic bladder). ? Not getting enough to drink, or not urinating often.  You have certain medical conditions, such as: ? Diabetes. ? A weak disease-fighting system (immunesystem). ? Sickle cell disease. ? Gout. ? Spinal cord injury. What are the signs or symptoms? Symptoms of this condition include:  Needing to urinate right away (urgently).  Frequent urination or passing small amounts of urine frequently.  Pain or burning with urination.  Blood in the  urine.  Urine that smells bad or unusual.  Trouble urinating.  Cloudy urine.  Vaginal discharge, if you are female.  Pain in the abdomen or the lower back. You may also have:  Vomiting or a decreased appetite.  Confusion.  Irritability or tiredness.  A fever.  Diarrhea. The first symptom in older adults may be confusion. In some cases, they may not have any symptoms until the infection has worsened. How is this diagnosed? This condition is diagnosed based on your medical history and a physical exam. You may also have other tests, including:  Urine tests.  Blood tests.  Tests for sexually transmitted infections (STIs). If you have had more than one UTI, a cystoscopy or imaging studies may be done to determine the cause of the infections. How is this treated? Treatment for this condition includes:  Antibiotic medicine.  Over-the-counter medicines to treat discomfort.  Drinking enough water to stay hydrated. If you have frequent infections or have other conditions such as a kidney stone, you may need to see a health care provider who specializes in the urinary tract (urologist). In rare cases, urinary tract infections can cause sepsis. Sepsis is a life-threatening condition that occurs when the body responds to an infection. Sepsis is treated in the hospital with IV antibiotics, fluids, and other medicines. Follow these instructions at home:  Medicines  Take over-the-counter and prescription medicines only as told by your health care provider.  If you were prescribed an antibiotic medicine, take it as told by your health care provider. Do not stop using the antibiotic even if you start to feel  better. General instructions  Make sure you: ? Empty your bladder often and completely. Do not hold urine for long periods of time. ? Empty your bladder after sex. ? Wipe from front to back after a bowel movement if you are female. Use each tissue one time when you  wipe.  Drink enough fluid to keep your urine pale yellow.  Keep all follow-up visits as told by your health care provider. This is important. Contact a health care provider if:  Your symptoms do not get better after 1-2 days.  Your symptoms go away and then return. Get help right away if you have:  Severe pain in your back or your lower abdomen.  A fever.  Nausea or vomiting. Summary  A urinary tract infection (UTI) is an infection of any part of the urinary tract, which includes the kidneys, ureters, bladder, and urethra.  Most urinary tract infections are caused by bacteria in your genital area, around the entrance to your urinary tract (urethra).  Treatment for this condition often includes antibiotic medicines.  If you were prescribed an antibiotic medicine, take it as told by your health care provider. Do not stop using the antibiotic even if you start to feel better.  Keep all follow-up visits as told by your health care provider. This is important. This information is not intended to replace advice given to you by your health care provider. Make sure you discuss any questions you have with your health care provider. Document Revised: 10/11/2018 Document Reviewed: 05/03/2018 Elsevier Patient Education  2020 [employer].

## 2020-02-11 NOTE — Progress Notes (Signed)
43 year old MWF G0 presents with classic UTI symptoms of increased frequency, urgency, burning and pain at end of stream for the last 2 days also having vaginal itching. Used over-the-counter Monistat and Azo yesterday.  Denies back pain, fever, or odor.  Reports history of recurrent UTIs in the past is used Macrodantin with intercourse.  Has been to fertility management desiring pregnancy.  Regular monthly cycle infrequent intercourse.  Medical problems include hypothyroidism, asthma and migraines.    Exam: Appears well.  No CVAT.  Abdomen soft, nontender, external genitalia mild erythema, speculum exam copious creamy discharge probable Monistat.  Wet prep negative.  Bimanual no CMT or adnexal tenderness. UA: +2 leukocytes, +2 blood, WBCs packed, RBCs greater than 60, many bacteria  UTI Yeast vaginitis  Plan: Septra twice daily for 3 days, UTI prevention discussed, refill of Macrodantin 50 mg to take with intercourse as needed.  Urine culture pending.  Diflucan 150 p.o. x1 dose, yeast prevention discussed.  Conceptual timing reviewed, reviewed importance of increasing frequency of intercourse especially day 7 to 21 of each month, follow-up with fertility management.

## 2020-02-14 LAB — URINALYSIS, COMPLETE W/RFL CULTURE
Bilirubin Urine: NEGATIVE
Glucose, UA: NEGATIVE
Hyaline Cast: NONE SEEN /LPF
Ketones, ur: NEGATIVE
Nitrites, Initial: POSITIVE — AB
RBC / HPF: >=60 /HPF — AB (ref 0–2)
Specific Gravity, Urine: 1.015 (ref 1.001–1.03)
pH: 6 (ref 5.0–8.0)

## 2020-02-14 LAB — URINE CULTURE
MICRO NUMBER:: 10332067
SPECIMEN QUALITY:: ADEQUATE

## 2020-02-14 LAB — CULTURE INDICATED

## 2020-02-18 ENCOUNTER — Encounter: Payer: Self-pay | Admitting: Physician Assistant

## 2020-02-19 ENCOUNTER — Other Ambulatory Visit: Payer: Self-pay | Admitting: Physician Assistant

## 2020-02-21 ENCOUNTER — Encounter: Payer: Self-pay | Admitting: Physician Assistant

## 2020-02-21 ENCOUNTER — Other Ambulatory Visit: Payer: Self-pay

## 2020-02-21 ENCOUNTER — Telehealth (INDEPENDENT_AMBULATORY_CARE_PROVIDER_SITE_OTHER): Payer: Federal, State, Local not specified - PPO | Admitting: Physician Assistant

## 2020-02-21 DIAGNOSIS — G8929 Other chronic pain: Secondary | ICD-10-CM | POA: Diagnosis not present

## 2020-02-21 DIAGNOSIS — M5416 Radiculopathy, lumbar region: Secondary | ICD-10-CM | POA: Diagnosis not present

## 2020-02-21 DIAGNOSIS — M5430 Sciatica, unspecified side: Secondary | ICD-10-CM

## 2020-02-21 MED ORDER — METHYLPREDNISOLONE 4 MG PO TBPK: ORAL_TABLET | ORAL | 0 refills | Status: DC

## 2020-02-21 MED ORDER — OXYCODONE-ACETAMINOPHEN 10-325 MG PO TABS: 1.0000 | ORAL_TABLET | Freq: Three times a day (TID) | ORAL | 0 refills | Status: DC | PRN

## 2020-02-21 NOTE — Progress Notes (Signed)
Virtual Visit via Video   I connected with patient on 02/21/20 at  9:30 AM EDT by a video enabled telemedicine application and verified that I am speaking with the correct person using two identifiers.  Location patient: Home Location provider: [facility] Persons participating in the virtual visit: Patient, Provider, [place] (Lisa Townsend)  I discussed the limitations of evaluation and management by telemedicine and the availability of in person appointments. The patient expressed understanding and agreed to proceed.  Subjective:   HPI:   Patient presents via [place] today to discuss a flareup of lower back pain.  Patient with longer standing history of lumbar herniated disc.  Was previously sent to neurosurgery after MRI revealed  "Advanced chronic disc and endplate degeneration at L5-S1, but without associated spinal or lateral recess stenosis."  Note she was told to start yoga and was given no further instruction.  As such she has not had any follow-up with neurosurgery.  Does have ongoing daily lower back discomfort which is usually managed by OTC medication.  Notes over the past 2 to 3 weeks having worsening lower back pain with pain radiating down her right leg.  Occasional tingling without numbness.  Denies saddle anesthesia or change to bowel or bladder habits.  Does have history of chronic constipation however.  Notes that activity makes this worse.  Was wanting to potentially get a refill of pain medication at which she was on previously to have on hand in case of bad days.  Was also wanting to discuss treatment for this acute flareup and next steps.  ROS:   See pertinent positives and negatives per HPI.  Patient Active Problem List   Diagnosis Date Noted  . Drug reaction 09/11/2017  . Chronic sinusitis 08/29/2017  . Chronic rhinitis 08/02/2017  . Primary insomnia 08/02/2017  . Constipation 10/13/2016  . Sciatica, right 08/02/2016  . H/O vitamin D deficiency  06/08/2016  . Visit for preventive health examination 06/18/2015  . Generalized anxiety disorder 05/21/2015  . Chronic migraine without aura without status migrainosus, not intractable 05/05/2015  . Sciatica 05/05/2015  . Hypothyroidism 01/06/2014  . Family history of breast cancer 12/27/2013  . Abdominal pain 07/26/2012  . Hypokalemia 07/26/2012    Social History   Tobacco Use  . Smoking status: Never Smoker  . Smokeless tobacco: Never Used  Substance Use Topics  . Alcohol use: Not Currently    Alcohol/week: 0.0 standard drinks    Current Outpatient Medications:  .  albuterol (VENTOLIN HFA) 108 (90 Base) MCG/ACT inhaler, Inhale 1-2 puffs into the lungs every 6 (six) hours as needed for wheezing or shortness of breath., Disp: 18 g, Rfl: 0 .  cetirizine (ZYRTEC) 10 MG tablet, Take 10 mg by mouth daily., Disp: , Rfl:  .  diazepam (VALIUM) 10 MG tablet, TAKE 1 TABLET BY MOUTH EVERY 12 HOURS AS NEEDED FOR ANXIETY., Disp: 60 tablet, Rfl: 0 .  hydrOXYzine (ATARAX/VISTARIL) 50 MG tablet, Take 1 tablet (50 mg total) by mouth at bedtime as needed (insomnia)., Disp: 30 tablet, Rfl: 0 .  levothyroxine (SYNTHROID) 75 MCG tablet, TAKE 1 TABLET BY MOUTH DAILY BEFORE BREAKFAST., Disp: 90 tablet, Rfl: 1 .  naproxen sodium (ALEVE) 220 MG tablet, Take 220 mg by mouth., Disp: , Rfl:  .  nitrofurantoin (MACRODANTIN) 50 MG capsule, Take as needed with intercourse, Disp: 30 capsule, Rfl: 1 .  ondansetron (ZOFRAN-ODT) 8 MG disintegrating tablet, For further refills, please schedule an appointment with provider., Disp: 20 tablet, Rfl: 0 .  rizatriptan (MAXALT) 10 MG tablet, TAKE 1 TABLET BY MOUTH AS NEEDED FOR MIGRAINE. MAY REPEAT IN 2 HOURS IF NEEDED, Disp: 10 tablet, Rfl: 3  Allergies  Allergen Reactions  . Claritin [Loratadine]     shakiness  . Lexapro [Escitalopram] Nausea Only    Objective:   LMP 01/27/2020   Patient is well-developed, well-nourished in no acute distress.  Resting comfortably  at home.  Head is normocephalic, atraumatic.  No labored breathing.  Speech is clear and coherent with logical content.  Patient is alert and oriented at baseline.   Assessment and Plan:   1. Chronic radicular lumbar pain Will restart her on a PRN pain medication for bad days. Otherwise will stick to OTC analgesics. Referral to different Neurosurgery practice placed and she needs updated imaging and further ongoing management.  - oxyCODONE-acetaminophen (PERCOCET) 10-325 MG tablet; Take 1 tablet by mouth every 8 (eight) hours as needed for up to 5 days for pain.  Dispense: 15 tablet; Refill: 0 - Ambulatory referral to Neurosurgery  2. Acute sciatica Rx Medrol dose pack. Avoid heavy lifting or overexertion.  Percocet as directed.  Stretching exercises reviewed.  Patient to let us know if symptoms are not improving.  Referrals been placed back to neurosurgery for her chronic radicular issues.  Strict ER precautions reviewed with patient. - methylPREDNISolone (MEDROL DOSEPAK) 4 MG TBPK tablet; Take following package directions  Dispense: 21 tablet; Refill: 0 - oxyCODONE-acetaminophen (PERCOCET) 10-325 MG tablet; Take 1 tablet by mouth every 8 (eight) hours as needed for up to 5 days for pain.  Dispense: 15 tablet; Refill: 0  Patient voiced understanding and agreement with plan   Leeanne Rio, PA-C 02/21/2020

## 2020-02-21 NOTE — Patient Instructions (Signed)
Instructions sent to MyChart

## 2020-02-21 NOTE — Progress Notes (Signed)
I have discussed the procedure for the virtual visit with the patient who has given consent to proceed with assessment and treatment.   Emet Rafanan S Virlan Kempker, CMA     

## 2020-02-26 ENCOUNTER — Ambulatory Visit (INDEPENDENT_AMBULATORY_CARE_PROVIDER_SITE_OTHER): Payer: Federal, State, Local not specified - PPO | Admitting: Otolaryngology

## 2020-03-04 ENCOUNTER — Encounter (INDEPENDENT_AMBULATORY_CARE_PROVIDER_SITE_OTHER): Payer: Self-pay | Admitting: Otolaryngology

## 2020-03-04 ENCOUNTER — Ambulatory Visit (INDEPENDENT_AMBULATORY_CARE_PROVIDER_SITE_OTHER): Payer: Federal, State, Local not specified - PPO | Admitting: Otolaryngology

## 2020-03-04 ENCOUNTER — Other Ambulatory Visit: Payer: Self-pay

## 2020-03-04 VITALS — Temp 97.9°F

## 2020-03-04 DIAGNOSIS — R430 Anosmia: Secondary | ICD-10-CM

## 2020-03-04 NOTE — Progress Notes (Signed)
HPI: Lisa Townsend is a 44 y.o. female who presents is referred by her PCP for evaluation of loss of sense of taste and smell since she got Covid back in last October.  Apparently several family members got Covid last year and several family members loss their sense of smell but they all recovered their sense of smell after several weeks except for her.  Some still have some persistent nasal sinus symptoms.  However she still cannot smell and taste well.  She does not have that much difficulty breathing but she still has occasional sinus pressure at times.  She has no significant respiratory problems and no clinical symptoms of sinus infection. She is having no headache and no fevers.  Past Medical History:  Diagnosis Date  . Chronic constipation   . Hypothyroidism   . Migraine   . Vitamin D deficiency    Past Surgical History:  Procedure Laterality Date  . BREAST BIOPSY Left   . NOSE SURGERY     Social History   Socioeconomic History  . Marital status: Married    Spouse name: Not on file  . Number of children: 0  . Years of education: Not on file  . Highest education level: Not on file  Occupational History  . Not on file  Tobacco Use  . Smoking status: Never Smoker  . Smokeless tobacco: Never Used  Substance and Sexual Activity  . Alcohol use: Not Currently    Alcohol/week: 0.0 standard drinks  . Drug use: No  . Sexual activity: Yes    Partners: Male    Comment: husband with vasectomy  Other Topics Concern  . Not on file  Social History Narrative  . Not on file   Social Determinants of Health   Financial Resource Strain:   . Difficulty of Paying Living Expenses:   Food Insecurity:   . Worried About Charity fundraiser in the Last Year:   . Arboriculturist in the Last Year:   Transportation Needs:   . Film/video editor (Medical):   Marland Kitchen Lack of Transportation (Non-Medical):   Physical Activity:   . Days of Exercise per Week:   . Minutes of Exercise per  Session:   Stress:   . Feeling of Stress :   Social Connections:   . Frequency of Communication with Friends and Family:   . Frequency of Social Gatherings with Friends and Family:   . Attends Religious Services:   . Active Member of Clubs or Organizations:   . Attends Archivist Meetings:   Marland Kitchen Marital Status:    Family History  Problem Relation Age of Onset  . Urolithiasis Father   . Cancer Mother   . Hypertension Mother   . Breast cancer Mother 18  . Breast cancer Maternal Grandmother        dx under 32  . Stomach cancer Paternal Grandmother        dx in her 32s  . Alcohol abuse Maternal Uncle   . Prostate cancer Paternal Uncle        mid 21s   Allergies  Allergen Reactions  . Claritin [Loratadine]     shakiness  . Lexapro [Escitalopram] Nausea Only   Prior to Admission medications   Medication Sig Start Date End Date Taking? Authorizing Provider  albuterol (VENTOLIN HFA) 108 (90 Base) MCG/ACT inhaler Inhale 1-2 puffs into the lungs every 6 (six) hours as needed for wheezing or shortness of breath. 09/04/19  Yes Mani,  Freida Busman, PA-C  cetirizine (ZYRTEC) 10 MG tablet Take 10 mg by mouth daily.   Yes [provider]  diazepam (VALIUM) 10 MG tablet TAKE 1 TABLET BY MOUTH EVERY 12 HOURS AS NEEDED FOR ANXIETY. 01/03/20  Yes Brunetta Jeans, PA-C  hydrOXYzine (ATARAX/VISTARIL) 50 MG tablet Take 1 tablet (50 mg total) by mouth at bedtime as needed (insomnia). 10/29/19  Yes Brunetta Jeans, PA-C  levothyroxine (SYNTHROID) 75 MCG tablet TAKE 1 TABLET BY MOUTH DAILY BEFORE BREAKFAST. 10/30/19  Yes Brunetta Jeans, PA-C  methylPREDNISolone (MEDROL DOSEPAK) 4 MG TBPK tablet Take following package directions 02/21/20  Yes Brunetta Jeans, PA-C  naproxen sodium (ALEVE) 220 MG tablet Take 220 mg by mouth.   Yes [provider]  nitrofurantoin (MACRODANTIN) 50 MG capsule Take as needed with intercourse 02/11/20  Yes Huel Cote, NP  ondansetron (ZOFRAN-ODT) 8  MG disintegrating tablet For further refills, please schedule an appointment with provider. 03/06/19  Yes Brunetta Jeans, PA-C  rizatriptan (MAXALT) 10 MG tablet TAKE 1 TABLET BY MOUTH AS NEEDED FOR MIGRAINE. MAY REPEAT IN 2 HOURS IF NEEDED 10/29/19  Yes Brunetta Jeans, PA-C     Positive ROS: Otherwise negative  All other systems have been reviewed and were otherwise negative with the exception of those mentioned in the HPI and as above.  Physical Exam: Constitutional: Alert, well-appearing, no acute distress Ears: External ears without lesions or tenderness. Ear canals are clear bilaterally with intact, clear TMs are clear bilaterally.  Nasal: External nose without lesions. Septum is midline with mild rhinitis..  After decongesting the nose both nasal passages are clear with clear middle meatus bilaterally.  Superior nasal vault is clear with no polyps.  There are no clinical signs of infection.  She is breathing comfortably. Oral: Lips and gums without lesions. Tongue and palate mucosa without lesions. Posterior oropharynx clear. Neck: No palpable adenopathy or masses Respiratory: Breathing comfortably  Skin: No facial/neck lesions or rash noted.  Procedures  Assessment: Mild rhinitis. Anosmia following COVID  Plan: Discussed extensively with the patient concerning limited treatment for anosmia following Covid or any other viral infections. Following Covid which has a predilection for causing anosmia more so than other viral infections.  The endoscopy had generally will resolve within a week or 2 and will recover within a month for 95% of the patients that develop this however a small percentage will have persistent anosmia. Treatment is limited and briefly discussed some studies that show some improvement with anosmia training using some smells that can be purchased off [employer] and retraining the brain. There is no specific medical treatment that is effective in the sense of smell  will generally recover spontaneously.   Radene Journey, MD   CC:

## 2020-03-16 ENCOUNTER — Encounter: Payer: Self-pay | Admitting: Physician Assistant

## 2020-03-16 NOTE — Telephone Encounter (Signed)
Ok to place referral to Allergy and asthma

## 2020-03-17 ENCOUNTER — Other Ambulatory Visit: Payer: Self-pay

## 2020-03-17 DIAGNOSIS — J310 Chronic rhinitis: Secondary | ICD-10-CM

## 2020-03-18 ENCOUNTER — Encounter: Payer: Self-pay | Admitting: Physician Assistant

## 2020-03-19 MED ORDER — DICLOFENAC SODIUM 75 MG PO TBEC: 75.0000 mg | DELAYED_RELEASE_TABLET | Freq: Two times a day (BID) | ORAL | 0 refills | Status: DC

## 2020-03-19 NOTE — Telephone Encounter (Signed)
Should not be taking these at the same time as they are the same class of medication and use together can cause GI bleed, ulcers, etc. I am fine with her continuing one or the other. Ok to send in a refill of the Diclofenac to use up to BID, Quantity 60 with 0 refills.

## 2020-03-27 DIAGNOSIS — M549 Dorsalgia, unspecified: Secondary | ICD-10-CM | POA: Diagnosis not present

## 2020-03-27 DIAGNOSIS — M545 Low back pain: Secondary | ICD-10-CM | POA: Diagnosis not present

## 2020-03-27 DIAGNOSIS — M5431 Sciatica, right side: Secondary | ICD-10-CM | POA: Diagnosis not present

## 2020-03-27 DIAGNOSIS — Z6825 Body mass index (BMI) 25.0-25.9, adult: Secondary | ICD-10-CM | POA: Diagnosis not present

## 2020-03-27 DIAGNOSIS — M4726 Other spondylosis with radiculopathy, lumbar region: Secondary | ICD-10-CM | POA: Diagnosis not present

## 2020-04-01 ENCOUNTER — Other Ambulatory Visit: Payer: Self-pay | Admitting: Emergency Medicine

## 2020-04-01 DIAGNOSIS — G8929 Other chronic pain: Secondary | ICD-10-CM

## 2020-04-01 MED ORDER — DICLOFENAC SODIUM 75 MG PO TBEC: 75.0000 mg | DELAYED_RELEASE_TABLET | Freq: Two times a day (BID) | ORAL | 0 refills | Status: DC

## 2020-04-08 ENCOUNTER — Other Ambulatory Visit: Payer: Self-pay

## 2020-04-08 ENCOUNTER — Ambulatory Visit: Payer: Federal, State, Local not specified - PPO | Attending: Orthopaedic Surgery | Admitting: Physical Therapy

## 2020-04-08 ENCOUNTER — Encounter: Payer: Self-pay | Admitting: Physical Therapy

## 2020-04-08 DIAGNOSIS — M5441 Lumbago with sciatica, right side: Secondary | ICD-10-CM | POA: Diagnosis not present

## 2020-04-08 DIAGNOSIS — M6281 Muscle weakness (generalized): Secondary | ICD-10-CM | POA: Insufficient documentation

## 2020-04-08 DIAGNOSIS — G8929 Other chronic pain: Secondary | ICD-10-CM | POA: Diagnosis not present

## 2020-04-08 DIAGNOSIS — M62830 Muscle spasm of back: Secondary | ICD-10-CM

## 2020-04-08 DIAGNOSIS — R293 Abnormal posture: Secondary | ICD-10-CM | POA: Diagnosis not present

## 2020-04-08 NOTE — Progress Notes (Signed)
New Patient Note  RE: Lisa Townsend MRN: [id number] DOB: 1976-02-06 Date of Office Visit: 04/09/2020  Referring provider: Delorse Limber Primary care provider: Brunetta Jeans, PA-C  Chief Complaint: Allergic Rhinitis  (Been sick last couple of months, nose is dry, headache, eyes, running)  History of Present Illness: I had the pleasure of seeing Lisa Townsend for initial evaluation at the [facility] on 04/09/2020. She is a 44 y.o. female, who is referred here by Brunetta Jeans, PA-C for the evaluation of rhinitis. Up to date with COVID-19 vaccine: yes  She reports symptoms of headaches, itchy/watery eyes, rhinorrhea, sneezing, nasal congestion. Symptoms have been going on for a few years but worsening each year. The symptoms are present all year around with worsening in spring, summer. Other triggers include exposure to unknown. Anosmia: diminished due to COVID 19-infection since October 2020. Headache: yes. She has used zyrtec, allegra, Flonase with minimal improvement in symptoms. Sinus infections: no. Previous work up includes: none. Previous ENT evaluation: yes for anosmia.  Previous sinus imaging: no. History of nasal polyps: no. Last eye exam: last year. History of reflux: takes ranitidine as needed.  Assessment and Plan: Lisa Townsend is a 44 y.o. female with: Other allergic rhinitis Perennial rhino conjunctivitis symptoms for the past few years but worsening each year. Spring and summer is the worst. Tried Zyrtec, Allegra, Flonase with minimal benefit. No previous allergy testing. 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 + reflux. Does not tolerate Claritin.  Today's skin testing showed: Positive to grass, weed, ragweed, trees, mold.   Start environmental control measures as below.  May use over the counter antihistamines such as Zyrtec (cetirizine), Allegra (fexofenadine), or Xyzal (levocetirizine) daily as needed - samples given.    Start Nasacort 1 spray per nostril twice a day for nasal symptoms. Sample given.  If it's not covered by your insurance then let us know.   Nasal saline spray (i.e., Simply Saline) or nasal saline lavage (i.e., NeilMed) is recommended as needed and prior to medicated nasal sprays.  May use olopatadine eye drops 0.2% once a day as needed for itchy/watery eyes.  Check with insurance about injection coverage and let us know when ready to start.  Had a detailed discussion with patient/family that clinical history is suggestive of allergic rhinitis, and may benefit from allergy immunotherapy (AIT). Discussed in detail regarding the dosing, schedule, side effects (mild to moderate local allergic reaction and rarely systemic allergic reactions including anaphylaxis), and benefits (significant improvement in nasal symptoms, seasonal flares of asthma) of immunotherapy with the patient. There is significant time commitment involved with allergy shots, which includes weekly immunotherapy injections for first 9-12 months and then biweekly to monthly injections for 3-5 years. Consent was signed.  Allergic conjunctivitis of both eyes  See assessment and plan as above for allergic rhinitis.   Shortness of breath Daily shortness of breath for 3-4 months. No triggers noted. No prior diagnosis of asthma. Used albuterol during her COVID-19 infection with unknown benefit. No recent CXR. CBC diff, TSH normal in December 2020.   Today's spirometry showed: possible restrictive disease with 10% improvement in FEV1 post bronchodilator treatment. Clinically feeling some improvement.  Discussed with patient that given above spirometry results concerning for inflammation/reactive airway disease. Whether this is due to post COVID-19 infection versus her environmental allergies is hard to tell at this time.  Daily controller medication(s): start Symbicort 28mcg 2 puffs twice a day with spacer and rinse  mouth  afterwards. Spacer given and demonstrated proper use with inhaler. Patient understood technique and all questions/concerned were addressed.  May use albuterol rescue inhaler 2 puffs every 4 to 6 hours as needed for shortness of breath, chest tightness, coughing, and wheezing. May use albuterol rescue inhaler 2 puffs 5 to 15 minutes prior to strenuous physical activities. Monitor frequency of use.  Repeat spirometry at next visit.   Return in about 3 months (around 07/10/2020).  Meds ordered this encounter  Medications  . Olopatadine HCl 0.2 % SOLN    Sig: Apply 1 drop to eye daily as needed (itchy/watery eyes).    Dispense:  2.5 mL    Refill:  5  . budesonide-formoterol (SYMBICORT) 80-4.5 MCG/ACT inhaler    Sig: Inhale 2 puffs into the lungs in the morning and at bedtime. with spacer and rinse mouth afterwards.    Dispense:  1 Inhaler    Refill:  5  . triamcinolone (NASACORT) 55 MCG/ACT AERO nasal inhaler    Sig: Place 1 spray into the nose 2 (two) times daily. For nasal symptoms.    Dispense:  1 Inhaler    Refill:  5   Other allergy screening: Asthma: no  She reports symptoms of shortness of breath for the past 3-4 months. Current medications include none. She reports not using aerochamber with inhalers. She tried the following inhalers: albuterol during her COVID-19 infection. Main triggers are unknown. In the last month, frequency of symptoms: daily. Frequency of nocturnal symptoms: 0x/month. Frequency of SABA use: 0x/week. Interference with physical activity: sometimes. Sleep is undisturbed. In the last 12 months, emergency room visits/urgent care visits/doctor office visits or hospitalizations due to respiratory issues: 0. In the last 12 months, oral steroids courses: no. Lifetime history of hospitalization for respiratory issues: no. Prior intubations: no. History of pneumonia: no. She was not evaluated by allergist/pulmonologist in the past. Smoking exposure: no. Up to date with flu  vaccine: yes.  No recent CXR. CBC diff, TSH normal in December 2020.   Food allergy: no Medication allergy: no Hymenoptera allergy: no Urticaria: no Eczema:no History of recurrent infections suggestive of immunodeficency: no  Diagnostics: Spirometry:  Tracings reviewed. Her effort: It was hard to get consistent efforts and there is a question as to whether this reflects a maximal maneuver. FVC: 2.04L FEV1: 2.00L, 73% predicted FEV1/FVC ratio: 98% Interpretation: Spirometry consistent with possible restrictive disease with 10% improvement in FEV1 post bronchodilator treatment. Clinically feeling some improvement. Please see scanned spirometry results for details.  Skin Testing: Environmental allergy panel and select foods. Positive to grass, weed, ragweed, trees, mold. Negative to common foods.  Results discussed with patient/family. Airborne Adult Perc - 04/09/20 1026    Time Antigen Placed  1026    Allergen Manufacturer  Greer    Location  Back    Number of Test  59    Panel 1  Select    1. Control-Buffer 50% Glycerol  Negative    2. Control-Histamine 1 mg/ml  2+    3. Albumin saline  Negative    4. [place]  3+    5. [place]  --   +\-   6. Johnson  2+    7. [place]  4+    8. Meadow Fescue  4+    9. Perennial Rye  3+    10. Sweet Vernal  3+    11. Timothy  Negative    12. Cocklebur  Negative    13. Burweed Marshelder  Negative  14. Ragweed, short  Negative    15. Ragweed, Giant  Negative    16. Plantain,  English  2+    17. Lamb's Quarters  Negative    18. Sheep Sorrell  Negative    19. Rough Pigweed  Negative    20. Marsh Elder, Rough  Negative    21. Mugwort, Common  Negative    22. Ash mix  Negative    23. Birch mix  3+    24. Beech American  4+    25. Box, Elder  Negative    26. Cedar, red  Negative    27. Cottonwood, Eastern  2+    28. Elm mix  Negative    29. Hickory  2+    30. Maple mix  Negative    31. Oak, [place] mix  Negative    32. Pecan  Pollen  Negative    33. Pine mix  2+    34. Sycamore Eastern  Negative    35. Volcano, Black Pollen  Negative    36. Alternaria alternata  Negative    37. Cladosporium Herbarum  Negative    38. Aspergillus mix  Negative    39. Penicillium mix  Negative    40. Bipolaris sorokiniana (Helminthosporium)  Negative    41. Drechslera spicifera (Curvularia)  Negative    42. Mucor plumbeus  Negative    43. Fusarium moniliforme  Negative    44. Aureobasidium pullulans (pullulara)  Negative    45. Rhizopus oryzae  Negative    46. Botrytis cinera  Negative    47. Epicoccum nigrum  Negative    48. Phoma betae  Negative    49. Candida Albicans  Negative    50. Trichophyton mentagrophytes  Negative    51. Mite, D Farinae  5,000 AU/ml  Negative    52. Mite, D Pteronyssinus  5,000 AU/ml  Negative    53. Cat Hair 10,000 BAU/ml  Negative    54.  Dog Epithelia  Negative    55. Mixed Feathers  Negative    56. Horse Epithelia  Negative    57. Cockroach, German  Negative    58. Mouse  Negative    59. Tobacco Leaf  Negative     Intradermal - 04/09/20 1118    Time Antigen Placed  1108    Allergen Manufacturer  Greer    Location  Arm    Number of Test  10    Intradermal  Select    Control  Negative    Ragweed mix  2+    Mold 1  Negative    Mold 2  2+    Mold 3  Negative    Mold 4  Negative    Cat  Negative    Dog  Negative    Cockroach  Negative    Mite mix  Negative     Food Adult Perc - 04/09/20 1000    Time Antigen Placed  1026    Allergen Manufacturer  Greer    Location  Back    Number of allergen test  10    1. Peanut  Negative    2. Soybean  Negative    3. Wheat  Negative    4. Sesame  Negative    5. Milk, cow  Negative    6. Egg White, Chicken  Negative    7. Casein  Negative    8. Shellfish Mix  Negative    9. Fish Mix  Negative  10. Cashew  Negative       Past Medical History: Patient Active Problem List   Diagnosis Date Noted  . Allergic conjunctivitis of both eyes  04/09/2020  . Shortness of breath 04/09/2020  . Drug reaction 09/11/2017  . Chronic sinusitis 08/29/2017  . Chronic rhinitis 08/02/2017  . Primary insomnia 08/02/2017  . Constipation 10/13/2016  . Sciatica, right 08/02/2016  . H/O vitamin D deficiency 06/08/2016  . Other allergic rhinitis 02/19/2016  . Visit for preventive health examination 06/18/2015  . Generalized anxiety disorder 05/21/2015  . Chronic migraine without aura without status migrainosus, not intractable 05/05/2015  . Sciatica 05/05/2015  . Hypothyroidism 01/06/2014  . Family history of breast cancer 12/27/2013  . Abdominal pain 07/26/2012  . Hypokalemia 07/26/2012   Past Medical History:  Diagnosis Date  . Chronic constipation   . Hypothyroidism   . Migraine   . Urticaria   . Vitamin D deficiency    Past Surgical History: Past Surgical History:  Procedure Laterality Date  . BREAST BIOPSY Left   . NOSE SURGERY     Medication List:  Current Outpatient Medications  Medication Sig Dispense Refill  . cetirizine (ZYRTEC) 10 MG tablet Take 10 mg by mouth daily.    . diazepam (VALIUM) 10 MG tablet TAKE 1 TABLET BY MOUTH EVERY 12 HOURS AS NEEDED FOR ANXIETY. 60 tablet 0  . diclofenac (VOLTAREN) 75 MG EC tablet Take 1 tablet (75 mg total) by mouth 2 (two) times daily. 60 tablet 0  . gabapentin (NEURONTIN) 300 MG capsule Take 600 mg by mouth at bedtime.    . hydrOXYzine (ATARAX/VISTARIL) 50 MG tablet Take 1 tablet (50 mg total) by mouth at bedtime as needed (insomnia). 30 tablet 0  . levothyroxine (SYNTHROID) 75 MCG tablet TAKE 1 TABLET BY MOUTH DAILY BEFORE BREAKFAST. 90 tablet 1  . nitrofurantoin (MACRODANTIN) 50 MG capsule Take as needed with intercourse 30 capsule 1  . ondansetron (ZOFRAN-ODT) 8 MG disintegrating tablet For further refills, please schedule an appointment with provider. 20 tablet 0  . rizatriptan (MAXALT) 10 MG tablet TAKE 1 TABLET BY MOUTH AS NEEDED FOR MIGRAINE. MAY REPEAT IN 2 HOURS IF NEEDED  10 tablet 3  . budesonide-formoterol (SYMBICORT) 80-4.5 MCG/ACT inhaler Inhale 2 puffs into the lungs in the morning and at bedtime. with spacer and rinse mouth afterwards. 1 Inhaler 5  . Olopatadine HCl 0.2 % SOLN Apply 1 drop to eye daily as needed (itchy/watery eyes). 2.5 mL 5  . triamcinolone (NASACORT) 55 MCG/ACT AERO nasal inhaler Place 1 spray into the nose 2 (two) times daily. For nasal symptoms. 1 Inhaler 5   No current facility-administered medications for this visit.   Allergies: Allergies  Allergen Reactions  . Claritin [Loratadine]     shakiness  . Lexapro [Escitalopram] Nausea Only   Social History: Social History   Socioeconomic History  . Marital status: Married    Spouse name: Not on file  . Number of children: 0  . Years of education: Not on file  . Highest education level: Not on file  Occupational History  . Not on file  Tobacco Use  . Smoking status: Never Smoker  . Smokeless tobacco: Never Used  Substance and Sexual Activity  . Alcohol use: Not Currently    Alcohol/week: 0.0 standard drinks  . Drug use: No  . Sexual activity: Yes    Partners: Male    Comment: husband with vasectomy  Other Topics Concern  . Not on file  Social  History Narrative  . Not on file   Social Determinants of Health   Financial Resource Strain:   . Difficulty of Paying Living Expenses:   Food Insecurity:   . Worried About Charity fundraiser in the Last Year:   . Arboriculturist in the Last Year:   Transportation Needs:   . Film/video editor (Medical):   Marland Kitchen Lack of Transportation (Non-Medical):   Physical Activity:   . Days of Exercise per Week:   . Minutes of Exercise per Session:   Stress:   . Feeling of Stress :   Social Connections:   . Frequency of Communication with Friends and Family:   . Frequency of Social Gatherings with Friends and Family:   . Attends Religious Services:   . Active Member of Clubs or Organizations:   . Attends Archivist  Meetings:   Marland Kitchen Marital Status:    Lives in a house which is about 45 years old. Smoking: denies Occupation: works on the Engineer, mining History: Environmental education officer in the house: no Charity fundraiser in the family room: no Carpet in the bedroom: yes Heating: gas Cooling: central Pet: yes 1 dog x 7 yrs  Family History: Family History  Problem Relation Age of Onset  . Urolithiasis Father   . Cancer Mother   . Hypertension Mother   . Breast cancer Mother 57  . Allergic rhinitis Brother   . Breast cancer Maternal Grandmother        dx under 49  . Stomach cancer Paternal Grandmother        dx in her 88s  . Alcohol abuse Maternal Uncle   . Prostate cancer Paternal Uncle        mid 67s  . Asthma Neg Hx   . Eczema Neg Hx   . Urticaria Neg Hx    Review of Systems  Constitutional: Negative for appetite change, chills, fever and unexpected weight change.  HENT: Positive for congestion, postnasal drip, rhinorrhea and sneezing.   Eyes: Positive for itching.  Respiratory: Positive for shortness of breath. Negative for cough, chest tightness and wheezing.   Cardiovascular: Negative for chest pain.  Gastrointestinal: Positive for constipation. Negative for abdominal pain.  Genitourinary: Negative for difficulty urinating.  Skin: Negative for rash.  Allergic/Immunologic: Positive for environmental allergies. Negative for food allergies.  Neurological: Positive for headaches.   Objective: BP 100/68 (BP Location: Right Arm, Patient Position: Sitting, Cuff Size: Normal)   Pulse 70   Temp 97.8 F (36.6 C) (Temporal)   Resp 14   Ht 5' 1.5" (1.562 m)   Wt 144 lb (65.3 kg)   SpO2 99%   BMI 26.77 kg/m  Body mass index is 26.77 kg/m. Physical Exam  Constitutional: She is oriented to person, place, and time. She appears well-developed and well-nourished.  HENT:  Head: Normocephalic and atraumatic.  Right Ear: External ear normal.  Left Ear: External ear normal.  Nose: Nose normal.    Mouth/Throat: Oropharynx is clear and moist.  Eyes: Conjunctivae and EOM are normal.  Cardiovascular: Normal rate, regular rhythm and normal heart sounds. Exam reveals no gallop and no friction rub.  No murmur heard. Pulmonary/Chest: Effort normal and breath sounds normal. She has no wheezes. She has no rales.  Abdominal: Soft.  Musculoskeletal:     Cervical back: Neck supple.  Neurological: She is alert and oriented to person, place, and time.  Skin: Skin is warm. No rash noted.  Psychiatric: She has a normal  mood and affect. Her behavior is normal.  Nursing note and vitals reviewed.  The plan was reviewed with the patient/family, and all questions/concerned were addressed.  It was my pleasure to see Lisa Townsend today and participate in her care. Please feel free to contact me with any questions or concerns.  Sincerely,  Rexene Alberts, DO Allergy & Immunology  [facility] office: [phone] [facility] office: [place] office: [phone]

## 2020-04-08 NOTE — Therapy (Signed)
[facility] [place] [facility] [address]  [place], [place], [address] Phone: [phone]   Fax:  [phone]  Physical Therapy Evaluation  Patient Details  Name: Lisa Townsend MRN: [id number] Date of Birth: August 31, 1976 Referring Provider (PT): Ivan Croft, MD   Encounter Date: 04/08/2020  PT End of Session - 04/08/20 0846    Visit Number  1    Number of Visits  8    Date for PT Re-Evaluation  05/06/20    Authorization Type  Federal BCBS - VL = 50 (combined PT/OT/ST)    PT Start Time  [phone]    PT Stop Time  0942    PT Time Calculation (min)  56 min    Activity Tolerance  Patient tolerated treatment well    Behavior During Therapy  [facility] for tasks assessed/performed       Past Medical History:  Diagnosis Date  . Chronic constipation   . Hypothyroidism   . Migraine   . Vitamin D deficiency     Past Surgical History:  Procedure Laterality Date  . BREAST BIOPSY Left   . NOSE SURGERY      There were no vitals filed for this visit.   Subjective Assessment - 04/08/20 0851    Subjective  Pt reports h/o sciatic nerve issues for 20+ yrs - initially intermittent but over past few years has been nearly constant. Was referred by PCP to specialist a few years ago who recommended yoga and Pilates. More recent referral to Spine & Scoliosis specialist revealed advanced lower lumbar spondylosis/DDD and surgery was recommended but trial of PT necessary for insurance authorization for surgery or ESI.    Limitations  Sitting;Standing    How long can you sit comfortably?  10 minutes    How long can you stand comfortably?  5 minutes    Diagnostic tests  Per pt, x-ray revealing lower lumbar DDD    Patient Stated Goals  "to stop the pain"    Currently in Pain?  Yes    Pain Score  6    up to 8/10 at worst   Pain Location  Back    Pain Orientation  Lower;Right;Left    Pain Descriptors / Indicators  Stabbing;Shooting;Constant;Tiring    Pain Type  Chronic pain    Pain Radiating Towards  tingling down back of R leg to foot with intermittent numbness    Pain Onset  Other (comment)   20+yrs   Pain Frequency  Constant    Aggravating Factors   sitting with legs crossed, laying on R side, prolonged standing    Pain Relieving Factors  nothing    Effect of Pain on Daily Activities  cannot concentrate due to pain; limited postional tolerance - requires frequnct change of position         [facility] PT Assessment - 04/08/20 0846      Assessment   Medical Diagnosis  Lumbar spondylosis with radiculopathy    Referring Provider (PT)  Ivan Croft, MD    Onset Date/Surgical Date  --   20+ yrs   Hand Dominance  Right    Next MD Visit  after 8 visits of PT    Prior Therapy  remote h/o PT for LBP (>10 yrs)      Precautions   Precautions  None      Restrictions   Weight Bearing Restrictions  No      Balance Screen   Has the patient fallen in the  past 6 months  No    Has the patient had a decrease in activity level because of a fear of falling?   No    Is the patient reluctant to leave their home because of a fear of falling?   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    Leisure  reading, watching TV, was walking 5x/wk until a few weeks ago - stopped d/t pain and pain meds make her sleep more      Cognition   Overall Cognitive Status  Within Functional Limits for tasks assessed   lethargic d/t pain meds     Observation/Other Assessments   Focus on Therapeutic Outcomes  (FOTO)   Lumbar - Predicted 52% (48% limitation); Predicted 58% (42% limitation)      ROM / Strength   AROM / PROM / Strength  AROM;Strength      AROM   AROM Assessment Site  Lumbar    Lumbar Flexion  fingertips to floor - tightness in back of legs (R>L)    Lumbar Extension  25% limited    Lumbar - Right Side Bend  hand to fibular head - L LBP    Lumbar - Left Side Bend  hand to fibular head - R LBP    Lumbar - Right Rotation  25% limited    Lumbar - Left Rotation  10% limited      Strength   Strength Assessment Site  Hip;Knee;Ankle    Right/Left Hip  Right;Left    Right Hip Flexion  4-/5    Right Hip Extension  4-/5    Right Hip External Rotation   4-/5    Right Hip Internal Rotation  4-/5    Right Hip ABduction  4-/5    Right Hip ADduction  4/5    Left Hip Flexion  4/5    Left Hip Extension  4/5    Left Hip External Rotation  4-/5    Left Hip Internal Rotation  4-/5    Left Hip ABduction  4-/5    Left Hip ADduction  4/5    Right/Left Knee  Right;Left    Right Knee Flexion  4/5    Right Knee Extension  4+/5    Left Knee Flexion  4+/5    Left Knee Extension  5/5    Right/Left Ankle  Right;Left    Right Ankle Dorsiflexion  4/5    Right Ankle Plantar Flexion  4+/5    Left Ankle Dorsiflexion  4+/5    Left Ankle Plantar Flexion  5/5      Flexibility   Soft Tissue Assessment /Muscle Length  yes    Hamstrings  mild tight B    Quadriceps  mild tight B    ITB  WFL    Piriformis  mild tight B      Palpation   Palpation comment  ttp in lumbar parapinals (R>L),  B glutes and piriformis (R>L)                  Objective measurements completed on examination: See above findings.      [facility] PT Treatment/Exercise - 04/08/20 0846      Self-Care   Self-Care  Posture    Posture  Discussed proper posture for sitting at desk for work - emphasizing neutral spinal alignment and LE alignment.      Exercises   Exercises  Lumbar      Lumbar Exercises: Supine    Pelvic Tilt  10 reps;5 seconds             PT Education - 04/08/20 0940    Education Details  PT eval findings, anticipated POC, desk posture and initial HEP    Person(s) Educated  Patient    Methods  Explanation;Demonstration;Handout    Comprehension  Verbalized understanding;Returned demonstration;Need further instruction          PT Long Term Goals - 04/08/20 0942      PT LONG TERM GOAL #1   Title  Patient will be independent with ongoing/advanced HEP    Status  New    Target Date  05/06/20      PT LONG TERM GOAL #2   Title  Patient to demonstrate appropriate spinal posture and body mechanics needed for daily activities    Status  New    Target Date  05/06/20      PT LONG TERM GOAL #3   Title  Patient to report low back and/or LE radiculopathy pain reduction in frequency and intensity by >/= 50%    Status  New    Target Date  05/06/20      PT LONG TERM GOAL #4   Title  Patient to improve lumbar AROM to WNL/WFL without pain provocation    Status  New    Target Date  05/06/20      PT LONG TERM GOAL #5   Title  Patient will demonstrate improved B LE strength to >/= 4+/5 for improved stability and ease of mobility    Status  New    Target Date  05/06/20      PT LONG TERM GOAL #6   Title  Patient to report ability to perform ADLs, household and work-related tasks without increased pain    Status  New    Target Date  05/06/20             Plan - 04/08/20 0942    Clinical Impression Statement  Jensine is a 44 y/o female who presents to OP PT for chronic LBP with R LE radiculopathy due to lumbar spondylosis. She reports 20+ yr h/o LBP, initially intermittent becoming near constant over past few years. Pain exacerbated by prolonged sitting (necessary for work) or standing but slightly better with walking. Deficits include mildly decreased lumbar ROM, increased muscle and ttp t/o in R lumbar paraspinals, B glutes and piriformis, mildly limited flexibility in  proximal LE muscles, and mild LE weakness. Pain limits sitting, standing and sleeping tolerance and interferes with work and household tasks. Pt reports used to walk regularly for exercise, but pain meds make her too groggy recently. Jaquelyne will benefit from skilled PT services to address above impairments and allow for performance of normal daily activities with decreased pain interference.    Personal Factors and Comorbidities  Comorbidity 3+;Time since onset of injury/illness/exacerbation;Past/Current Experience;Fitness;Profession    Comorbidities  Hypothyroidism, anxiety, migraines  Examination-Activity Limitations  Bend;Caring for Others;Locomotion Level;Sit;Sleep;Stand;Stairs    Examination-Participation Restrictions  Cleaning;Community Activity;Interpersonal Relationship;Laundry;Meal Prep;Shop    Stability/Clinical Decision Making  Evolving/Moderate complexity    Clinical Decision Making  Moderate    Rehab Potential  Good    PT Frequency  2x / week    PT Duration  4 weeks    PT Treatment/Interventions  ADLs/Self Care Home Management;Cryotherapy;Electrical Stimulation;Iontophoresis 4mg /ml Dexamethasone;Moist Heat;Traction;Ultrasound;Functional mobility training;Therapeutic activities;Therapeutic exercise;Neuromuscular re-education;Patient/family education;Manual techniques;Passive range of motion;Dry needling;Taping;Spinal Manipulations;Joint Manipulations    PT Next Visit Plan  Expand on posture and body mechanics along with initial HEP; lumbopelvic flexibility & strengthening; manual therapy for increased muscle tension with possible DN; modalities PRN    PT Home Exercise Plan  6/2 - pelvic tilts    Consulted and Agree with Plan of Care  Patient       Patient will benefit from skilled therapeutic intervention in order to improve the following deficits and impairments:  Decreased activity tolerance, Decreased knowledge of precautions, Decreased mobility, Decreased range of motion,  Decreased strength, Difficulty walking, Increased fascial restricitons, Increased muscle spasms, Impaired perceived functional ability, Impaired flexibility, Improper body mechanics, Postural dysfunction, Pain  Visit Diagnosis: Chronic bilateral low back pain with right-sided sciatica  Abnormal posture  Muscle weakness (generalized)  Muscle spasm of back     Problem List Patient Active Problem List   Diagnosis Date Noted  . Drug reaction 09/11/2017  . Chronic sinusitis 08/29/2017  . Chronic rhinitis 08/02/2017  . Primary insomnia 08/02/2017  . Constipation 10/13/2016  . Sciatica, right 08/02/2016  . H/O vitamin D deficiency 06/08/2016  . Visit for preventive health examination 06/18/2015  . Generalized anxiety disorder 05/21/2015  . Chronic migraine without aura without status migrainosus, not intractable 05/05/2015  . Sciatica 05/05/2015  . Hypothyroidism 01/06/2014  . Family history of breast cancer 12/27/2013  . Abdominal pain 07/26/2012  . Hypokalemia 07/26/2012    Percival Spanish, PT, MPT 04/08/2020, 2:05 PM  [facility] [address]  [place], [place], [address] Phone: [phone]   Fax:  [phone]  Name: Lisa Townsend MRN: [id number] Date of Birth: [birth year]

## 2020-04-08 NOTE — Patient Instructions (Addendum)
    Home exercise program created by Dinora Hemm, PT.  For questions, please contact Delainey Winstanley via phone at [phone] or email at [email or web address]  [facility] [address]  [address] [place], [place], [address] Phone: [phone]   Fax:  [phone]    

## 2020-04-09 ENCOUNTER — Ambulatory Visit: Payer: Federal, State, Local not specified - PPO | Admitting: Allergy

## 2020-04-09 ENCOUNTER — Encounter: Payer: Self-pay | Admitting: Allergy

## 2020-04-09 VITALS — BP 100/68 | HR 70 | Temp 97.8°F | Resp 14 | Ht 61.5 in | Wt 144.0 lb

## 2020-04-09 DIAGNOSIS — H1013 Acute atopic conjunctivitis, bilateral: Secondary | ICD-10-CM | POA: Diagnosis not present

## 2020-04-09 DIAGNOSIS — R0602 Shortness of breath: Secondary | ICD-10-CM | POA: Diagnosis not present

## 2020-04-09 DIAGNOSIS — R062 Wheezing: Secondary | ICD-10-CM | POA: Diagnosis not present

## 2020-04-09 DIAGNOSIS — J3089 Other allergic rhinitis: Secondary | ICD-10-CM | POA: Diagnosis not present

## 2020-04-09 MED ORDER — OLOPATADINE HCL 0.2 % OP SOLN
1.0000 [drp] | Freq: Every day | OPHTHALMIC | 5 refills | Status: DC | PRN
Start: 2020-04-09 — End: 2023-06-23

## 2020-04-09 MED ORDER — BUDESONIDE-FORMOTEROL FUMARATE 80-4.5 MCG/ACT IN AERO
2.0000 | INHALATION_SPRAY | Freq: Two times a day (BID) | RESPIRATORY_TRACT | 5 refills | Status: DC
Start: 2020-04-09 — End: 2020-08-06

## 2020-04-09 MED ORDER — TRIAMCINOLONE ACETONIDE 55 MCG/ACT NA AERO
1.0000 | INHALATION_SPRAY | Freq: Two times a day (BID) | NASAL | 5 refills | Status: DC
Start: 2020-04-09 — End: 2020-11-30

## 2020-04-09 NOTE — Assessment & Plan Note (Signed)
   See assessment and plan as above for allergic rhinitis.  

## 2020-04-09 NOTE — Patient Instructions (Addendum)
Today's skin testing showed: Positive to grass, weed, ragweed, trees, mold.   Environmental allergies  Start environmental control measures as below.  May use over the counter antihistamines such as Zyrtec (cetirizine), Allegra (fexofenadine), or Xyzal (levocetirizine) daily as needed.  Start Nasacort 1 spray per nostril twice a day for nasal symptoms. Sample given.  If it's not covered by your insurance then let us know.   Nasal saline spray (i.e., Simply Saline) or nasal saline lavage (i.e., NeilMed) is recommended as needed and prior to medicated nasal sprays.  May use olopatadine eye drops 0.2% once a day as needed for itchy/watery eyes.  Check with your insurance about injection coverage and let us know when ready to start.   Had a detailed discussion with patient/family that clinical history is suggestive of allergic rhinitis, and may benefit from allergy immunotherapy (AIT). Discussed in detail regarding the dosing, schedule, side effects (mild to moderate local allergic reaction and rarely systemic allergic reactions including anaphylaxis), and benefits (significant improvement in nasal symptoms, seasonal flares of asthma) of immunotherapy with the patient. There is significant time commitment involved with allergy shots, which includes weekly immunotherapy injections for first 9-12 months and then biweekly to monthly injections for 3-5 years. Consent was signed.  Shortness of breath: Daily controller medication(s): start Symbicort 34mcg 2 puffs twice a day with spacer and rinse mouth afterwards. Spacer given and demonstrated proper use with inhaler. Patient understood technique and all questions/concerned were addressed.  May use albuterol rescue inhaler 2 puffs every 4 to 6 hours as needed for shortness of breath, chest tightness, coughing, and wheezing. May use albuterol rescue inhaler 2 puffs 5 to 15 minutes prior to strenuous physical activities. Monitor frequency of use.   Asthma control goals:  Full participation in all desired activities (may need albuterol before activity) Albuterol use two times or less a week on average (not counting use with activity) Cough interfering with sleep two times or less a month Oral steroids no more than once a year No hospitalizations  Follow up in 3 months or sooner if needed.  Make appointment for 3 weeks for injection visit.  Reducing Pollen Exposure . Pollen seasons: trees (spring), grass (summer) and ragweed/weeds (fall). Marland Kitchen Keep windows closed in your home and car to lower pollen exposure.  Susa Simmonds air conditioning in the bedroom and throughout the house if possible.  . Avoid going out in dry windy days - especially early morning. . Pollen counts are highest between 5 - 10 AM and on dry, hot and windy days.  . Save outside activities for late afternoon or after a heavy rain, when pollen levels are lower.  . Avoid mowing of grass if you have grass pollen allergy. Marland Kitchen Be aware that pollen can also be transported indoors on people and pets.  . Dry your clothes in an automatic dryer rather than hanging them outside where they might collect pollen.  . Rinse hair and eyes before bedtime.  Mold Control . Mold and fungi can grow on a variety of surfaces provided certain temperature and moisture conditions exist.  . Outdoor molds grow on plants, decaying vegetation and soil. The major outdoor mold, Alternaria and Cladosporium, are found in very high numbers during hot and dry conditions. Generally, a late summer - fall peak is seen for common outdoor fungal spores. Rain will temporarily lower outdoor mold spore count, but counts rise rapidly when the rainy period ends. . The most important indoor molds are Aspergillus and Penicillium. Dark,  humid and poorly ventilated basements are ideal sites for mold growth. The next most common sites of mold growth are the bathroom and the kitchen. Outdoor (Seasonal) Mold Control . Use air  conditioning and keep windows closed. . Avoid exposure to decaying vegetation. Marland Kitchen Avoid leaf raking. . Avoid grain handling. . Consider wearing a face mask if working in moldy areas.  Indoor (Perennial) Mold Control  . Maintain humidity below 50%. . Get rid of mold growth on hard surfaces with water, detergent and, if necessary, 5% bleach (do not mix with other cleaners). Then dry the area completely. If mold covers an area more than 10 square feet, consider hiring an indoor environmental professional. . For clothing, washing with soap and water is best. If moldy items cannot be cleaned and dried, throw them away. . Remove sources e.g. contaminated carpets. . Repair and seal leaking roofs or pipes. Using dehumidifiers in damp basements may be helpful, but empty the water and clean units regularly to prevent mildew from forming. All rooms, especially basements, bathrooms and kitchens, require ventilation and cleaning to deter mold and mildew growth. Avoid carpeting on concrete or damp floors, and storing items in damp areas.

## 2020-04-09 NOTE — Assessment & Plan Note (Signed)
Daily shortness of breath for 3-4 months. No triggers noted. No prior diagnosis of asthma. Used albuterol during her COVID-19 infection with unknown benefit. No recent CXR. CBC diff, TSH normal in December 2020.   Today's spirometry showed: possible restrictive disease with 10% improvement in FEV1 post bronchodilator treatment. Clinically feeling some improvement.  Discussed with patient that given above spirometry results concerning for inflammation/reactive airway disease. Whether this is due to post COVID-19 infection versus her environmental allergies is hard to tell at this time.  Daily controller medication(s): start Symbicort 73mcg 2 puffs twice a day with spacer and rinse mouth afterwards. Spacer given and demonstrated proper use with inhaler. Patient understood technique and all questions/concerned were addressed.  May use albuterol rescue inhaler 2 puffs every 4 to 6 hours as needed for shortness of breath, chest tightness, coughing, and wheezing. May use albuterol rescue inhaler 2 puffs 5 to 15 minutes prior to strenuous physical activities. Monitor frequency of use.  Repeat spirometry at next visit.

## 2020-04-09 NOTE — Assessment & Plan Note (Addendum)
Perennial rhino conjunctivitis symptoms for the past few years but worsening each year. Spring and summer is the worst. Tried Zyrtec, Allegra, Flonase with minimal benefit. No previous allergy testing. 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 + reflux. Does not tolerate Claritin.  Today's skin testing showed: Positive to grass, weed, ragweed, trees, mold.   Start environmental control measures as below.  May use over the counter antihistamines such as Zyrtec (cetirizine), Allegra (fexofenadine), or Xyzal (levocetirizine) daily as needed - samples given.   Start Nasacort 1 spray per nostril twice a day for nasal symptoms. Sample given.  If it's not covered by your insurance then let us know.   Nasal saline spray (i.e., Simply Saline) or nasal saline lavage (i.e., NeilMed) is recommended as needed and prior to medicated nasal sprays.  May use olopatadine eye drops 0.2% once a day as needed for itchy/watery eyes.  Check with insurance about injection coverage and let us know when ready to start.  Had a detailed discussion with patient/family that clinical history is suggestive of allergic rhinitis, and may benefit from allergy immunotherapy (AIT). Discussed in detail regarding the dosing, schedule, side effects (mild to moderate local allergic reaction and rarely systemic allergic reactions including anaphylaxis), and benefits (significant improvement in nasal symptoms, seasonal flares of asthma) of immunotherapy with the patient. There is significant time commitment involved with allergy shots, which includes weekly immunotherapy injections for first 9-12 months and then biweekly to monthly injections for 3-5 years. Consent was signed.

## 2020-04-10 ENCOUNTER — Ambulatory Visit: Payer: Federal, State, Local not specified - PPO | Admitting: Physical Therapy

## 2020-04-13 ENCOUNTER — Encounter: Payer: Federal, State, Local not specified - PPO | Admitting: Obstetrics & Gynecology

## 2020-04-14 ENCOUNTER — Other Ambulatory Visit: Payer: Self-pay

## 2020-04-14 ENCOUNTER — Ambulatory Visit: Payer: Federal, State, Local not specified - PPO | Admitting: Physical Therapy

## 2020-04-14 ENCOUNTER — Ambulatory Visit: Payer: Federal, State, Local not specified - PPO

## 2020-04-14 DIAGNOSIS — M62830 Muscle spasm of back: Secondary | ICD-10-CM | POA: Diagnosis not present

## 2020-04-14 DIAGNOSIS — R293 Abnormal posture: Secondary | ICD-10-CM | POA: Diagnosis not present

## 2020-04-14 DIAGNOSIS — M5441 Lumbago with sciatica, right side: Secondary | ICD-10-CM

## 2020-04-14 DIAGNOSIS — M6281 Muscle weakness (generalized): Secondary | ICD-10-CM

## 2020-04-14 DIAGNOSIS — G8929 Other chronic pain: Secondary | ICD-10-CM | POA: Diagnosis not present

## 2020-04-14 NOTE — Therapy (Signed)
[facility] [address]  [place], [place], [address] Phone: [phone]   Fax:  [phone]  Physical Therapy Treatment  Patient Details  Name: Lisa Townsend MRN: [id number] Date of Birth: [birth year] Referring Provider (PT): Ivan Croft, MD   Encounter Date: 04/14/2020  PT End of Session - 04/14/20 0811    Visit Number  2    Number of Visits  8    Date for PT Re-Evaluation  05/06/20    Authorization Type  Federal BCBS - VL = 50 (combined PT/OT/ST)    PT Start Time  0807   Pt. arrived late   PT Stop Time  0845    PT Time Calculation (min)  38 min    Activity Tolerance  Patient tolerated treatment well    Behavior During Therapy  [facility] for tasks assessed/performed       Past Medical History:  Diagnosis Date  . Chronic constipation   . Hypothyroidism   . Migraine   . Urticaria   . Vitamin D deficiency     Past Surgical History:  Procedure Laterality Date  . BREAST BIOPSY Left   . NOSE SURGERY      There were no vitals filed for this visit.  Subjective Assessment - 04/14/20 0808    Subjective  Doing ok.    Diagnostic tests  Per pt, x-ray revealing lower lumbar DDD    Patient Stated Goals  "to stop the pain"    Currently in Pain?  Yes    Pain Score  6     Pain Location  Back    Pain Orientation  Right;Lateral    Pain Descriptors / Indicators  Constant    Pain Type  Chronic pain    Pain Radiating Towards  pain radiating down R lateral thigh, posterior thigh    Pain Frequency  Constant                        [facility] Adult PT Treatment/Exercise - 04/14/20 0001      Self-Care   Self-Care  Other Self-Care Comments    Other Self-Care Comments   reviewed proper desk setup to reduce lumbar strain; deferred formal posture and body mechanics instruction due to technical difficulties.        Lumbar Exercises: Stretches   Passive Hamstring Stretch  Right;Left;1 rep;30 seconds    Passive Hamstring Stretch Limitations  supine with strap     Lower Trunk Rotation Limitations  5" x 10    Piriformis Stretch  Right;Left;2 reps;30 seconds    Piriformis Stretch Limitations  KTOS      Lumbar Exercises: Aerobic   Nustep  Lvl 3, 6 min (UE/LE)      Lumbar Exercises: Supine   Pelvic Tilt  10 reps;5 seconds    Pelvic Tilt Limitations  good technique     Clam  10 reps;3 seconds    Clam Limitations  alteranting with yellow TB     Bridge  10 reps;3 seconds    Bridge Limitations  well tolerated                   PT Long Term Goals - 04/14/20 7494      PT LONG TERM GOAL #1   Title  Patient will be independent with ongoing/advanced HEP    Status  On-going      PT LONG TERM GOAL #2   Title  Patient to  demonstrate appropriate spinal posture and body mechanics needed for daily activities    Status  On-going      PT LONG TERM GOAL #3   Title  Patient to report low back and/or LE radiculopathy pain reduction in frequency and intensity by >/= 50%    Status  On-going      PT LONG TERM GOAL #4   Title  Patient to improve lumbar AROM to WNL/WFL without pain provocation    Status  On-going      PT LONG TERM GOAL #5   Title  Patient will demonstrate improved B LE strength to >/= 4+/5 for improved stability and ease of mobility    Status  On-going      PT LONG TERM GOAL #6   Title  Patient to report ability to perform ADLs, household and work-related tasks without increased pain    Status  On-going            Plan - 04/14/20 2992    Clinical Impression Statement  Pt. with complaint of feeling "groggy" today which did not limit session.  Tolerated progression of proximal hip stretching and strengthening well today.  No issues with HEP with good technique.  Did seem to have a good understanding of proper desk setup to reduce lumbar strain.  Ended visit without complaint.    Comorbidities  Hypothyroidism, anxiety, migraines    Rehab Potential  Good    PT  Frequency  2x / week    PT Treatment/Interventions  ADLs/Self Care Home Management;Cryotherapy;Electrical Stimulation;Iontophoresis 4mg /ml Dexamethasone;Moist Heat;Traction;Ultrasound;Functional mobility training;Therapeutic activities;Therapeutic exercise;Neuromuscular re-education;Patient/family education;Manual techniques;Passive range of motion;Dry needling;Taping;Spinal Manipulations;Joint Manipulations    PT Next Visit Plan  Expand on posture and body mechanics along with initial HEP; lumbopelvic flexibility & strengthening; manual therapy for increased muscle tension with possible DN; modalities PRN    PT Home Exercise Plan  6/2 - pelvic tilts    Consulted and Agree with Plan of Care  Patient       Patient will benefit from skilled therapeutic intervention in order to improve the following deficits and impairments:  Decreased activity tolerance, Decreased knowledge of precautions, Decreased mobility, Decreased range of motion, Decreased strength, Difficulty walking, Increased fascial restricitons, Increased muscle spasms, Impaired perceived functional ability, Impaired flexibility, Improper body mechanics, Postural dysfunction, Pain  Visit Diagnosis: Chronic bilateral low back pain with right-sided sciatica  Abnormal posture  Muscle weakness (generalized)  Muscle spasm of back     Problem List Patient Active Problem List   Diagnosis Date Noted  . Allergic conjunctivitis of both eyes 04/09/2020  . Shortness of breath 04/09/2020  . Drug reaction 09/11/2017  . Chronic sinusitis 08/29/2017  . Chronic rhinitis 08/02/2017  . Primary insomnia 08/02/2017  . Constipation 10/13/2016  . Sciatica, right 08/02/2016  . H/O vitamin D deficiency 06/08/2016  . Other allergic rhinitis 02/19/2016  . Visit for preventive health examination 06/18/2015  . Generalized anxiety disorder 05/21/2015  . Chronic migraine without aura without status migrainosus, not intractable 05/05/2015  . Sciatica  05/05/2015  . Hypothyroidism 01/06/2014  . Family history of breast cancer 12/27/2013  . Abdominal pain 07/26/2012  . Hypokalemia 07/26/2012    Bess Harvest, PTA 04/14/20 12:33 PM   [facility] [address]  [place], [place], [address] Phone: [phone]   Fax:  [phone]  Name: Lisa Townsend MRN: [id number] Date of Birth: 05-11-1976

## 2020-04-14 NOTE — Patient Instructions (Signed)

## 2020-04-16 ENCOUNTER — Telehealth: Payer: Self-pay

## 2020-04-16 DIAGNOSIS — J302 Other seasonal allergic rhinitis: Secondary | ICD-10-CM | POA: Diagnosis not present

## 2020-04-16 NOTE — Progress Notes (Signed)
VIALS EXP 04-16-21

## 2020-04-16 NOTE — Telephone Encounter (Signed)
Spoke to patient and she stated the insurance company [employer] will cover the injections if proper paper work is submitted to them. Patients appointment is schedule for 04/30/2020 and the serum can be mixed.

## 2020-04-16 NOTE — Telephone Encounter (Signed)
Called to ask patient if insurance has approved the allergy injections but she has a mailbox that is not set up yet. I'll call back later.

## 2020-04-16 NOTE — Telephone Encounter (Signed)
-----   Message from Garnet Sierras, DO sent at 04/16/2020  9:04 AM EDT ----- Regarding: allergy shots I have a request to mix her serum but she was supposed to check with her insurance before we mix. Can someone call patient to confirm that her insurance covers it and she wants to start injections?  Thank you.

## 2020-04-16 NOTE — Addendum Note (Signed)
Addended by: Garnet Sierras on: 04/16/2020 01:51 PM   Modules accepted: Orders

## 2020-04-17 ENCOUNTER — Other Ambulatory Visit: Payer: Self-pay

## 2020-04-17 ENCOUNTER — Ambulatory Visit: Payer: Federal, State, Local not specified - PPO

## 2020-04-17 ENCOUNTER — Encounter: Payer: Self-pay | Admitting: Physician Assistant

## 2020-04-17 DIAGNOSIS — G8929 Other chronic pain: Secondary | ICD-10-CM | POA: Diagnosis not present

## 2020-04-17 DIAGNOSIS — M6281 Muscle weakness (generalized): Secondary | ICD-10-CM

## 2020-04-17 DIAGNOSIS — M62830 Muscle spasm of back: Secondary | ICD-10-CM | POA: Diagnosis not present

## 2020-04-17 DIAGNOSIS — M5441 Lumbago with sciatica, right side: Secondary | ICD-10-CM | POA: Diagnosis not present

## 2020-04-17 DIAGNOSIS — R293 Abnormal posture: Secondary | ICD-10-CM

## 2020-04-17 DIAGNOSIS — M5430 Sciatica, unspecified side: Secondary | ICD-10-CM

## 2020-04-17 NOTE — Therapy (Signed)
[facility] [address]  [place], [place], [address] Phone: [phone]   Fax:  [phone]  Physical Therapy Treatment  Patient Details  Name: Lisa Townsend MRN: [id number] Date of Birth: [birth year] Referring Provider (PT): Ivan Croft, MD   Encounter Date: 04/17/2020   PT End of Session - 04/17/20 0946    Visit Number 3    Number of Visits 8    Date for PT Re-Evaluation 05/06/20    Authorization Type Federal [employer]BS - VL = 50 (combined PT/OT/ST)    PT Start Time 0933    PT Stop Time 1040    PT Time Calculation (min) 67 min    Activity Tolerance Patient tolerated treatment well    Behavior During Therapy [facility] for tasks assessed/performed           Past Medical History:  Diagnosis Date  . Chronic constipation   . Hypothyroidism   . Migraine   . Urticaria   . Vitamin D deficiency     Past Surgical History:  Procedure Laterality Date  . BREAST BIOPSY Left   . NOSE SURGERY      There were no vitals filed for this visit.   Subjective Assessment - 04/17/20 0940    Subjective notes increased pain ~ 1 hour after last session which lasted for duration of day and required her to take pain medication to sleep.    Diagnostic tests Per pt, x-ray revealing lower lumbar DDD    Patient Stated Goals "to stop the pain"    Currently in Pain? Yes    Pain Score 7     Pain Location Back    Pain Orientation Right;Lateral    Pain Descriptors / Indicators Constant    Pain Type Chronic pain    Pain Radiating Towards Pain radiating down R lateral thigh, posterior thigh into R foot    Pain Frequency Constant              [facility] PT Assessment - 04/17/20 0001      Assessment   Referring Provider (PT) Ivan Croft, MD    Next MD Visit 05/05/20                         [facility] Adult PT Treatment/Exercise - 04/17/20 0001      Self-Care   Self-Care Other Self-Care Comments    Other Self-Care  Comments  self-tennis ball release on wall to R buttocks       Lumbar Exercises: Stretches   Piriformis Stretch Right;Left;2 reps;30 seconds    Piriformis Stretch Limitations KTOS      Lumbar Exercises: Aerobic   Nustep Lvl 3, 6 min (UE/LE)      Lumbar Exercises: Supine   Glut Set 10 reps;5 seconds    Glut Set Limitations Hooklying glute set       Modalities   Modalities Electrical Stimulation;Moist Heat      Moist Heat Therapy   Number Minutes Moist Heat 15 Minutes    Moist Heat Location Hip   R buttocks      Electrical Stimulation   Electrical Stimulation Location R superior buttocks     Electrical Stimulation Action IFC    Electrical Stimulation Parameters 80-150Hz , to tolerance, 15'    Electrical Stimulation Goals Pain;Tone      Manual Therapy   Manual Therapy Soft tissue mobilization;Myofascial release    Manual therapy comments Sidelying  Soft tissue mobilization STM/DTM to B lumbar paraspinals, R buttocks, R pirforims, R superior buttocks, R glute med- ttp throughout     Myofascial Release TPR to R glute medius, R piriformis                   PT Education - 04/17/20 1052    Education Details HEP update;  Hooklying glute set, Hooklying KTOS stretch, Tennis ball self-release on wall    Person(s) Educated Patient    Methods Explanation;Demonstration;Verbal cues;Handout    Comprehension Verbalized understanding;Returned demonstration;Verbal cues required               PT Long Term Goals - 04/14/20 0811      PT LONG TERM GOAL #1   Title Patient will be independent with ongoing/advanced HEP    Status On-going      PT LONG TERM GOAL #2   Title Patient to demonstrate appropriate spinal posture and body mechanics needed for daily activities    Status On-going      PT LONG TERM GOAL #3   Title Patient to report low back and/or LE radiculopathy pain reduction in frequency and intensity by >/= 50%    Status On-going      PT LONG TERM GOAL #4   Title  Patient to improve lumbar AROM to WNL/WFL without pain provocation    Status On-going      PT LONG TERM GOAL #5   Title Patient will demonstrate improved B LE strength to >/= 4+/5 for improved stability and ease of mobility    Status On-going      PT LONG TERM GOAL #6   Title Patient to report ability to perform ADLs, household and work-related tasks without increased pain    Status On-going                 Plan - 04/17/20 0950    Clinical Impression Statement Benjamine Mola reporting she had increased R buttocks pain starting ~ 1 hour after last session with prolonged sitting and lasted into night which required her to take pain medication to be able to sleep.  Noting pain subsided in following days.  Patient with significant increased tension/tone in lumbar paraspinals, R superior buttocks, R glute med, R piriformis which was addressed with good response with MT.  Performed gentle R proximal hip stretching and glute setting for muscular activation.  Ended visit with trial of moist heat/electrical stimulation for further muscular relaxation with pt. noting good relief following.  Updated HEP.  Will monitor response and continue HEP updated as able in coming session.    Comorbidities Hypothyroidism, anxiety, migraines    Rehab Potential Good    PT Frequency 2x / week    PT Treatment/Interventions ADLs/Self Care Home Management;Cryotherapy;Electrical Stimulation;Iontophoresis 4mg /ml Dexamethasone;Moist Heat;Traction;Ultrasound;Functional mobility training;Therapeutic activities;Therapeutic exercise;Neuromuscular re-education;Patient/family education;Manual techniques;Passive range of motion;Dry needling;Taping;Spinal Manipulations;Joint Manipulations    PT Next Visit Plan Expand on posture and body mechanics along with initial HEP; lumbopelvic flexibility & strengthening; manual therapy for increased muscle tension with possible DN; modalities PRN    PT Home Exercise Plan 6/2 - pelvic tilts     Consulted and Agree with Plan of Care Patient           Patient will benefit from skilled therapeutic intervention in order to improve the following deficits and impairments:  Decreased activity tolerance, Decreased knowledge of precautions, Decreased mobility, Decreased range of motion, Decreased strength, Difficulty walking, Increased fascial restricitons, Increased muscle spasms, Impaired perceived functional ability,  Impaired flexibility, Improper body mechanics, Postural dysfunction, Pain  Visit Diagnosis: Chronic bilateral low back pain with right-sided sciatica  Abnormal posture  Muscle weakness (generalized)  Muscle spasm of back     Problem List Patient Active Problem List   Diagnosis Date Noted  . Allergic conjunctivitis of both eyes 04/09/2020  . Shortness of breath 04/09/2020  . Drug reaction 09/11/2017  . Chronic sinusitis 08/29/2017  . Chronic rhinitis 08/02/2017  . Primary insomnia 08/02/2017  . Constipation 10/13/2016  . Sciatica, right 08/02/2016  . H/O vitamin D deficiency 06/08/2016  . Other allergic rhinitis 02/19/2016  . Visit for preventive health examination 06/18/2015  . Generalized anxiety disorder 05/21/2015  . Chronic migraine without aura without status migrainosus, not intractable 05/05/2015  . Sciatica 05/05/2015  . Hypothyroidism 01/06/2014  . Family history of breast cancer 12/27/2013  . Abdominal pain 07/26/2012  . Hypokalemia 07/26/2012    Bess Harvest, PTA 04/17/20 10:59 AM   [facility] [address]  [place], [place], [address] Phone: [phone]   Fax:  [phone]  Name: SHAKARI QAZI MRN: [id number] Date of Birth: 24-Nov-1975

## 2020-04-20 ENCOUNTER — Other Ambulatory Visit: Payer: Self-pay

## 2020-04-21 ENCOUNTER — Ambulatory Visit (INDEPENDENT_AMBULATORY_CARE_PROVIDER_SITE_OTHER): Payer: Federal, State, Local not specified - PPO | Admitting: Obstetrics & Gynecology

## 2020-04-21 ENCOUNTER — Encounter: Payer: Self-pay | Admitting: Obstetrics & Gynecology

## 2020-04-21 VITALS — BP 122/78 | Ht 60.0 in | Wt 141.0 lb

## 2020-04-21 DIAGNOSIS — N979 Female infertility, unspecified: Secondary | ICD-10-CM

## 2020-04-21 DIAGNOSIS — Z01419 Encounter for gynecological examination (general) (routine) without abnormal findings: Secondary | ICD-10-CM

## 2020-04-21 MED ORDER — OXYCODONE-ACETAMINOPHEN 10-325 MG PO TABS: 1.0000 | ORAL_TABLET | Freq: Three times a day (TID) | ORAL | 0 refills | Status: AC | PRN

## 2020-04-21 NOTE — Telephone Encounter (Signed)
Pt called in stating that she reached out to the specialist, they advised her to call her PCP to prescribe the medication for this pain. They said they don't do the medication unless she has had surgical procedure. Please advise pt states that she is in a lot of pain.

## 2020-04-21 NOTE — Progress Notes (Signed)
Lisa Townsend 1976-11-07 [id number]   History:    44 y.o. G0 Married.  Vasectomy reversed 2018.  RP:  Established patient presenting for annual gyn exam/Discuss lab results  HPI:  Menses regular normal every month.  No pelvic pain.  Normal vaginal secretions.  Husband had a Vasectomy reversal in 2018, now with a good sperm count per patient.  Attempting conception.  Seen by Dr Carmela Rima.  IUI Clomid not successful.  IVF too expensive.  No egg freezing done.  Urine normal.  BMs wnl.  Breasts wnl.  Mother deceased, Dx of breast Ca at 54.  MGM Breast Cancer Dx <50. Breasts normal.  Screening mammo 01/2020 Lt Negative, Rt Dx mammo/US Probably benign, repeat Rt breast US in 6 months.  BMI 27.54.  Will restart fitness when sciatic nerve pain improved.  Healthy nutrition.  Health Labs with Fam MD.   Past medical history,surgical history, family history and social history were all reviewed and documented in the EPIC chart.  Gynecologic History Patient's last menstrual period was 04/11/2020.  Obstetric History OB History  Gravida Para Term Preterm AB Living  0 0 0 0 0 0  SAB TAB Ectopic Multiple Live Births  0 0 0 0 0     ROS: A ROS was performed and pertinent positives and negatives are included in the history.  GENERAL: No fevers or chills. HEENT: No change in vision, no earache, sore throat or sinus congestion. NECK: No pain or stiffness. CARDIOVASCULAR: No chest pain or pressure. No palpitations. PULMONARY: No shortness of breath, cough or wheeze. GASTROINTESTINAL: No abdominal pain, nausea, vomiting or diarrhea, melena or bright red blood per rectum. GENITOURINARY: No urinary frequency, urgency, hesitancy or dysuria. MUSCULOSKELETAL: No joint or muscle pain, no back pain, no recent trauma. DERMATOLOGIC: No rash, no itching, no lesions. ENDOCRINE: No polyuria, polydipsia, no heat or cold intolerance. No recent change in weight. HEMATOLOGICAL: No anemia or easy bruising or bleeding.  NEUROLOGIC: No headache, seizures, numbness, tingling or weakness. PSYCHIATRIC: No depression, no loss of interest in normal activity or change in sleep pattern.     Exam:   BP 122/78   Ht 5' (1.524 m)   Wt 141 lb (64 kg)   LMP 04/11/2020   BMI 27.54 kg/m   Body mass index is 27.54 kg/m.  General appearance : Well developed well nourished female. No acute distress HEENT: Eyes: no retinal hemorrhage or exudates,  Neck supple, trachea midline, no carotid bruits, no thyroidmegaly Lungs: Clear to auscultation, no rhonchi or wheezes, or rib retractions  Heart: Regular rate and rhythm, no murmurs or gallops Breast:Examined in sitting and supine position were symmetrical in appearance, no palpable masses or tenderness,  no skin retraction, no nipple inversion, no nipple discharge, no skin discoloration, no axillary or supraclavicular lymphadenopathy Abdomen: no palpable masses or tenderness, no rebound or guarding Extremities: no edema or skin discoloration or tenderness  Pelvic: Vulva: Normal             Vagina: No gross lesions or discharge  Cervix: No gross lesions or discharge.  Pap reflex done.  Uterus  AV, normal size, shape and consistency, non-tender and mobile  Adnexa  Without masses or tenderness  Anus: Normal   Assessment/Plan:  44 y.o. female for annual exam   1. Encounter for routine gynecological examination with Papanicolaou smear of cervix Normal gynecologic exam.  Pap reflex done.  Breast exam normal.  Screening mammogram March 2021 was negative on the left and a  right diagnostic mammogram and ultrasound was probably benign.  Right breast ultrasound to repeat at 6 months.  Health labs with family physician.  Body mass index 27.54.  Fitness and healthy nutrition to continue.  2. Primary female infertility Primary infertility with advanced maternal age at 47 and husband post reversal of vasectomy with good sperm count per patient.  Counseling on fertility.  No egg  freezing done.  Unsuccessful IUI on Clomid.  Decision to refer to fertility specialist at [facility].  Other orders - oxyCODONE-acetaminophen (PERCOCET) 10-325 MG tablet; Take 1 tablet by mouth every 4 (four) hours as needed for pain.  Princess Bruins MD, 8:29 AM 04/21/2020

## 2020-04-22 ENCOUNTER — Ambulatory Visit: Payer: Federal, State, Local not specified - PPO

## 2020-04-22 ENCOUNTER — Encounter: Payer: Self-pay | Admitting: Obstetrics & Gynecology

## 2020-04-22 ENCOUNTER — Other Ambulatory Visit: Payer: Self-pay

## 2020-04-22 ENCOUNTER — Encounter: Payer: Self-pay | Admitting: *Deleted

## 2020-04-22 DIAGNOSIS — R293 Abnormal posture: Secondary | ICD-10-CM

## 2020-04-22 DIAGNOSIS — G8929 Other chronic pain: Secondary | ICD-10-CM

## 2020-04-22 DIAGNOSIS — M5441 Lumbago with sciatica, right side: Secondary | ICD-10-CM

## 2020-04-22 DIAGNOSIS — M6281 Muscle weakness (generalized): Secondary | ICD-10-CM | POA: Diagnosis not present

## 2020-04-22 DIAGNOSIS — M62830 Muscle spasm of back: Secondary | ICD-10-CM

## 2020-04-22 LAB — PAP IG W/ RFLX HPV ASCU

## 2020-04-22 NOTE — Patient Instructions (Signed)

## 2020-04-22 NOTE — Patient Instructions (Signed)
1. Encounter for routine gynecological examination with Papanicolaou smear of cervix Normal gynecologic exam.  Pap reflex done.  Breast exam normal.  Screening mammogram March 2021 was negative on the left and a right diagnostic mammogram and ultrasound was probably benign.  Right breast ultrasound to repeat at 6 months.  Health labs with family physician.  Body mass index 27.54.  Fitness and healthy nutrition to continue.  2. Primary female infertility Primary infertility with advanced maternal age at 40 and husband post reversal of vasectomy with good sperm count per patient.  Counseling on fertility.  No egg freezing done.  Unsuccessful IUI on Clomid.  Decision to refer to fertility specialist at [facility].  Other orders - oxyCODONE-acetaminophen (PERCOCET) 10-325 MG tablet; Take 1 tablet by mouth every 4 (four) hours as needed for pain.  Ziva, it was a pleasure seeing you today!  I will inform you of your results as soon as they are available.

## 2020-04-23 ENCOUNTER — Telehealth: Payer: Self-pay | Admitting: *Deleted

## 2020-04-23 ENCOUNTER — Ambulatory Visit: Payer: Federal, State, Local not specified - PPO | Admitting: Allergy

## 2020-04-23 NOTE — Telephone Encounter (Signed)
-----   Message from Princess Bruins, MD sent at 04/21/2020  8:53 AM EDT ----- Regarding: Refer to Fertility specialist at [facility] Primary infertility.  Patient is 44 yo with regular menses monthly, husband has a good sperm count post Vasectomy reversal.  Please refer to Media planner at [facility].  Seen by Dr Carmela Rima previously, wants a different specialist.

## 2020-04-23 NOTE — Therapy (Signed)
[facility] [address]  [place], [place], [address] Phone: [phone]   Fax:  [phone]  Physical Therapy Treatment  Patient Details  Name: SURI TAFOLLA MRN: [id number] Date of Birth: 1976-07-27 Referring Provider (PT): Ivan Croft, MD   Encounter Date: 04/22/2020    Past Medical History:  Diagnosis Date  . Chronic constipation   . Hypothyroidism   . Migraine   . Urticaria   . Vitamin D deficiency     Past Surgical History:  Procedure Laterality Date  . BREAST BIOPSY Left   . NOSE SURGERY      There were no vitals filed for this visit.   Subjective Assessment - 04/22/20 2030    Subjective Noting increased pain night after last session.    Diagnostic tests Per pt, x-ray revealing lower lumbar DDD    Patient Stated Goals "to stop the pain"    Currently in Pain? 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 requesting to avoid warmup      Lumbar Exercises: Supine   Glut Set 10 reps;5 seconds    Glut Set Limitations  Hooklying glute set     Clam 10 reps;3 seconds    Clam Limitations alteranting with red TB       Lumbar Exercises: Quadruped   Madcat/Old Horse 10 reps    Madcat/Old Horse Limitations min cueing     Straight Leg Raise 10 reps;3 seconds    Straight Leg Raises Limitations quadruped       Moist Heat Therapy   Number Minutes Moist Heat 15 Minutes    Moist Heat Location Hip   Hooklying B buttocks      Electrical Stimulation   Electrical Stimulation Location R superior buttocks     Electrical Stimulation Action IFC    Electrical Stimulation Parameters Electrical stimulation Goals  80-150Hz , intensity to pt. tolerance, 15'  Pain;tone                      PT Long Term Goals - 04/14/20 0388      PT LONG TERM GOAL #1   Title Patient will be independent with ongoing/advanced HEP    Status On-going      PT LONG TERM GOAL #2   Title Patient to demonstrate appropriate spinal posture and body mechanics needed for  daily activities    Status On-going      PT LONG TERM GOAL #3   Title Patient to report low back and/or LE radiculopathy pain reduction in frequency and intensity by >/= 50%    Status On-going      PT LONG TERM GOAL #4   Title Patient to improve lumbar AROM to WNL/WFL without pain provocation    Status On-going      PT LONG TERM GOAL #5   Title Patient will demonstrate improved B LE strength to >/= 4+/5 for improved stability and ease of mobility    Status On-going      PT LONG TERM GOAL #6   Title Patient to report ability to perform ADLs, household and work-related tasks without increased pain    Status On-going                 Plan - 04/22/20 2032  Clinical Impression: Pt. reporting she had to take another pain pill to help her sleep night after last session secondary to increased R LE pain.  With high reported initial subjective pain to begin session in R lower back which she notes radiates down R lateral LE.  Pt. requesting to skip initial  NuStep warmup, and MT as she feels these activities increased her pain last session.  Pt. verbalizing in session that she is attending therapy as this was required by MD before she could receive epidural spinal injection.  Focused session on review of proper desk posture as pt. noting productivity demands force her to sit during entire work shift from home and admitting to poor sitting posture and unable to change positions frequently which may be contributing to increased pain levels.  Duration of session focused on proximal hip strengthening/LE stretching to patient tolerance as pt. with tenderness in glutes however declining MT. Pt. noting relief from E-stim/moist thus continued this to glutes in hooklying to end session with good relief noted following.     Comorbidities Hypothyroidism, anxiety, migraines    Rehab Potential Good    PT Frequency 2x / week    PT Treatment/Interventions ADLs/Self Care Home Management;Cryotherapy;Electrical Stimulation;Iontophoresis 4mg /ml Dexamethasone;Moist Heat;Traction;Ultrasound;Functional mobility training;Therapeutic activities;Therapeutic exercise;Neuromuscular re-education;Patient/family education;Manual techniques;Passive range of motion;Dry needling;Taping;Spinal Manipulations;Joint Manipulations    PT Next Visit Plan expand on Initial HEP per pt. response; lumbopelvic flexibility & strengthening; manual therapy for increased muscle tension with possible DN; modalities PRN    PT Home Exercise Plan 6/2 - pelvic tilts; 04/17/20: Hooklying glute set, Hooklying KTOS stretch, self-ball glute massage on wall   Consulted and Agree with Plan of Care Patient           Patient will benefit from skilled therapeutic intervention in order to improve the following deficits and impairments:  Decreased activity tolerance, Decreased knowledge of precautions, Decreased mobility, Decreased range of motion, Decreased strength, Difficulty walking, Increased fascial restricitons,  Increased muscle spasms, Impaired perceived functional ability, Impaired flexibility, Improper body mechanics, Postural dysfunction, Pain  Visit Diagnosis: Chronic bilateral low back pain with right-sided sciatica  Abnormal posture  Muscle weakness (generalized)  Muscle spasm of back     Problem List Patient Active Problem List   Diagnosis Date Noted  . Allergic conjunctivitis of both eyes 04/09/2020  . Shortness of breath 04/09/2020  . Drug reaction 09/11/2017  . Chronic sinusitis 08/29/2017  . Chronic rhinitis 08/02/2017  . Primary insomnia 08/02/2017  . Constipation 10/13/2016  . Sciatica, right 08/02/2016  . H/O vitamin D deficiency 06/08/2016  . Other allergic  rhinitis 02/19/2016  . Visit for preventive health examination 06/18/2015  . Generalized anxiety disorder 05/21/2015  . Chronic migraine without aura without status migrainosus, not intractable 05/05/2015  . Sciatica 05/05/2015  . Hypothyroidism 01/06/2014  . Family history of breast cancer 12/27/2013  . Abdominal pain 07/26/2012  . Hypokalemia 07/26/2012    Bess Harvest, PTA 04/23/20 8:41 PM   [facility] [address]  [place], [place], [address] Phone: [phone]   Fax:  [phone]  Name: ALICHIA ALRIDGE MRN: [id number] Date of Birth: August 13, 1976

## 2020-04-23 NOTE — Telephone Encounter (Signed)
Office notes faxed to [phone]. [facility] they will call to schedule. patient informed via my chart.

## 2020-04-24 ENCOUNTER — Ambulatory Visit: Payer: Federal, State, Local not specified - PPO | Admitting: Physical Therapy

## 2020-04-24 ENCOUNTER — Encounter: Payer: Self-pay | Admitting: Physical Therapy

## 2020-04-24 ENCOUNTER — Other Ambulatory Visit: Payer: Self-pay

## 2020-04-24 DIAGNOSIS — R293 Abnormal posture: Secondary | ICD-10-CM

## 2020-04-24 DIAGNOSIS — M62830 Muscle spasm of back: Secondary | ICD-10-CM

## 2020-04-24 DIAGNOSIS — G8929 Other chronic pain: Secondary | ICD-10-CM

## 2020-04-24 DIAGNOSIS — M6281 Muscle weakness (generalized): Secondary | ICD-10-CM | POA: Diagnosis not present

## 2020-04-24 DIAGNOSIS — M5441 Lumbago with sciatica, right side: Secondary | ICD-10-CM | POA: Diagnosis not present

## 2020-04-24 NOTE — Therapy (Signed)
[facility] [address]  [place], [place], [address] Phone: [phone]   Fax:  [phone]  Physical Therapy Treatment  Patient Details  Name: Lisa Townsend MRN: [id number] Date of Birth: 1976/04/17 Referring Provider (PT): Ivan Croft, MD   Encounter Date: 04/24/2020   PT End of Session - 04/24/20 0800    Visit Number 5    Number of Visits 8    Date for PT Re-Evaluation 05/06/20    Authorization Type Federal BCBS - VL = 50 (combined PT/OT/ST)    PT Start Time 0800    PT Stop Time 0904    PT Time Calculation (min) 64 min    Activity Tolerance Patient tolerated treatment well    Behavior During Therapy [facility] for tasks assessed/performed           Past Medical History:  Diagnosis Date  . Chronic constipation   . Hypothyroidism   . Migraine   . Urticaria   . Vitamin D deficiency     Past Surgical History:  Procedure Laterality Date  . BREAST BIOPSY Left   . NOSE SURGERY      There were no vitals filed for this visit.   Subjective Assessment - 04/24/20 0802    Subjective Pt reporting "pain is still the same". She states she did order a home TENS unit.    Diagnostic tests Per pt, x-ray revealing lower lumbar DDD    Patient Stated Goals "to stop the pain"    Currently in Pain? Yes    Pain Score 6     Pain Location Back    Pain Orientation Right;Lower    Pain Descriptors / Indicators Constant;Aching;Burning    Pain Type Chronic pain    Pain Radiating Towards pain down R lateral leg to foot at worst    Pain Frequency Constant                             [facility] Adult PT Treatment/Exercise - 04/24/20 0800      Exercises   Exercises Lumbar      Lumbar Exercises: Stretches   Piriformis Stretch Right;Left;2 reps;30 seconds    Piriformis Stretch Limitations supine KTOS      Lumbar Exercises: Supine   Pelvic Tilt 10 reps;5 seconds    Clam 10 reps;3 seconds    Clam  Limitations red TB alt hip ABD/ER - cues for abd bracing    Bridge 10 reps;5 seconds    Bridge Limitations + red TB hip ABD isometric      Modalities   Modalities Electrical Stimulation;Moist Heat      Moist Heat Therapy   Number Minutes Moist Heat 15 Minutes    Moist Heat Location Hip;Lumbar Spine   Right (L side lying)     Electrical Stimulation   Electrical Stimulation Location R buttocks    Electrical Stimulation Action IFC    Electrical Stimulation Parameters 80-150Hz , intensity to pt tolerance x 15'    Electrical Stimulation Goals Pain;Tone      Manual Therapy   Manual Therapy Joint mobilization;Soft tissue mobilization;Myofascial release    Manual therapy comments L side lying with R LE supported on bolster    Joint Mobilization Lumbar CPAs and R/L UPAs - grade 3-4    Soft tissue mobilization STM/DTM to B lumbar paraspinals, R glutes, pirforims - ttp throughout, most pronounced in lateral glute med/min and piriformis  Myofascial Release TPR to lateral R glute medius & minimus, R piriformis             Trigger Point Dry Needling - 04/24/20 0800    Consent Given? Yes    Education Handout Provided Yes    Muscles Treated Back/Hip Gluteus minimus;Piriformis   Right   Gluteus Minimus Response Twitch response elicited;Palpable increased muscle length    Piriformis Response Twitch response elicited;Palpable increased muscle length                     PT Long Term Goals - 04/24/20 0904      PT LONG TERM GOAL #1   Title Patient will be independent with ongoing/advanced HEP    Status Partially Met    Target Date 05/06/20      PT LONG TERM GOAL #2   Title Patient to demonstrate appropriate spinal posture and body mechanics needed for daily activities    Status On-going    Target Date 05/06/20      PT LONG TERM GOAL #3   Title Patient to report low back and/or LE radiculopathy pain reduction in frequency and intensity by >/= 50%    Status On-going    Target  Date 05/06/20      PT LONG TERM GOAL #4   Title Patient to improve lumbar AROM to WNL/WFL without pain provocation    Status On-going    Target Date 05/06/20      PT LONG TERM GOAL #5   Title Patient will demonstrate improved B LE strength to >/= 4+/5 for improved stability and ease of mobility    Status On-going    Target Date 05/06/20      PT LONG TERM GOAL #6   Title Patient to report ability to perform ADLs, household and work-related tasks without increased pain    Status On-going    Target Date 05/06/20                 Plan - 04/24/20 0904    Clinical Impression Statement Darleene reports pain essentially unchanged with exercises/activities during therapy session often increasing her pain later in the day. Pain more localized to R buttock than low back with ongoing R LE radiculopathy extending to R foot when at its worst. Palpation of R glutes/piriformis identifying multiple taut bands/TPs with reproduction of LE radiculopathy - addressed manual STM/DTM and TPR incorporating DN upon informed pt consent followed by stretching and lumbopelvic stabilization/strengthening. Session concluded with estim and moist heat as pt noting benefit from this in prior visits.    Personal Factors and Comorbidities Comorbidity 3+;Time since onset of injury/illness/exacerbation;Past/Current Experience;Fitness;Profession    Comorbidities Hypothyroidism, anxiety, migraines    Examination-Activity Limitations Bend;Caring for Others;Locomotion Level;Sit;Sleep;Stand;Stairs    Examination-Participation Restrictions Cleaning;Community Activity;Interpersonal Relationship;Laundry;Meal Prep;Shop    Rehab Potential Good    PT Frequency 2x / week    PT Duration 4 weeks    PT Treatment/Interventions ADLs/Self Care Home Management;Cryotherapy;Electrical Stimulation;Iontophoresis 93m/ml Dexamethasone;Moist Heat;Traction;Ultrasound;Functional mobility training;Therapeutic activities;Therapeutic  exercise;Neuromuscular re-education;Patient/family education;Manual techniques;Passive range of motion;Dry needling;Taping;Spinal Manipulations;Joint Manipulations    PT Next Visit Plan expand HEP as tolerated; lumbopelvic flexibility & strengthening; manual therapy for increased muscle tension with possible DN; modalities PRN    PT Home Exercise Plan 6/2 - pelvic tilts; 6/11 - hooklying glute sets, hooklying KTOS piriformis stretch, self-glute massage on wall with ball    Consulted and Agree with Plan of Care Patient  Patient will benefit from skilled therapeutic intervention in order to improve the following deficits and impairments:  Decreased activity tolerance, Decreased knowledge of precautions, Decreased mobility, Decreased range of motion, Decreased strength, Difficulty walking, Increased fascial restricitons, Increased muscle spasms, Impaired perceived functional ability, Impaired flexibility, Improper body mechanics, Postural dysfunction, Pain  Visit Diagnosis: Chronic bilateral low back pain with right-sided sciatica  Abnormal posture  Muscle weakness (generalized)  Muscle spasm of back     Problem List Patient Active Problem List   Diagnosis Date Noted  . Allergic conjunctivitis of both eyes 04/09/2020  . Shortness of breath 04/09/2020  . Drug reaction 09/11/2017  . Chronic sinusitis 08/29/2017  . Chronic rhinitis 08/02/2017  . Primary insomnia 08/02/2017  . Constipation 10/13/2016  . Sciatica, right 08/02/2016  . H/O vitamin D deficiency 06/08/2016  . Other allergic rhinitis 02/19/2016  . Visit for preventive health examination 06/18/2015  . Generalized anxiety disorder 05/21/2015  . Chronic migraine without aura without status migrainosus, not intractable 05/05/2015  . Sciatica 05/05/2015  . Hypothyroidism 01/06/2014  . Family history of breast cancer 12/27/2013  . Abdominal pain 07/26/2012  . Hypokalemia 07/26/2012    Percival Spanish, PT,  MPT 04/24/2020, 12:58 PM  [facility] [address]  [place], [place], [address] Phone: [phone]   Fax:  [phone]  Name: CHERYLIN WAGUESPACK MRN: [id number] Date of Birth: 05-10-1976

## 2020-04-24 NOTE — Patient Instructions (Signed)

## 2020-04-27 ENCOUNTER — Other Ambulatory Visit: Payer: Self-pay | Admitting: Physician Assistant

## 2020-04-27 DIAGNOSIS — J3089 Other allergic rhinitis: Secondary | ICD-10-CM | POA: Diagnosis not present

## 2020-04-27 MED ORDER — LEVOTHYROXINE SODIUM 75 MCG PO TABS: ORAL_TABLET | ORAL | 1 refills | Status: DC

## 2020-04-28 ENCOUNTER — Ambulatory Visit: Payer: Federal, State, Local not specified - PPO

## 2020-04-28 ENCOUNTER — Other Ambulatory Visit: Payer: Self-pay

## 2020-04-28 DIAGNOSIS — M62830 Muscle spasm of back: Secondary | ICD-10-CM

## 2020-04-28 DIAGNOSIS — R293 Abnormal posture: Secondary | ICD-10-CM

## 2020-04-28 DIAGNOSIS — M5441 Lumbago with sciatica, right side: Secondary | ICD-10-CM | POA: Diagnosis not present

## 2020-04-28 DIAGNOSIS — G8929 Other chronic pain: Secondary | ICD-10-CM | POA: Diagnosis not present

## 2020-04-28 DIAGNOSIS — M6281 Muscle weakness (generalized): Secondary | ICD-10-CM

## 2020-04-28 NOTE — Therapy (Signed)
[facility] [address]  [place], [place], [address] Phone: [phone]   Fax:  [phone]  Physical Therapy Treatment  Patient Details  Name: Lisa Townsend MRN: [id number] Date of Birth: [birth year] Referring Provider (PT): Ivan Croft, MD   Encounter Date: 04/28/2020   PT End of Session - 04/28/20 1034    Visit Number 6    Number of Visits 8    Date for PT Re-Evaluation 05/06/20    Authorization Type Federal [employer]BS - VL = 50 (combined PT/OT/ST)    PT Start Time 0803    PT Stop Time 0846    PT Time Calculation (min) 43 min    Activity Tolerance Patient tolerated treatment well    Behavior During Therapy [facility] for tasks assessed/performed           Past Medical History:  Diagnosis Date  . Chronic constipation   . Hypothyroidism   . Migraine   . Urticaria   . Vitamin D deficiency     Past Surgical History:  Procedure Laterality Date  . BREAST BIOPSY Left   . NOSE SURGERY      There were no vitals filed for this visit.   Subjective Assessment - 04/28/20 0806    Subjective Pt reports the needling was effective last time and the relief lasted for a few days.    Diagnostic tests Per pt, x-ray revealing lower lumbar DDD    Patient Stated Goals "to stop the pain"    Currently in Pain? Yes    Pain Score 7     Pain Location Hip    Pain Orientation Right    Pain Descriptors / Indicators Aching   sometimes stabbing   Pain Type Chronic pain    Pain Radiating Towards referring to R distal glute and posterior leg all the way to calf                             [facility] Adult PT Treatment/Exercise - 04/28/20 0001      Lumbar Exercises: Stretches   Standing Extension 10 reps    Standing Extension Limitations mat table elevated to PSIS    Prone on Elbows Stretch Limitations 3 minutes   no pain   Press Ups 10 reps    Press Ups Limitations small lift 1st set, moderately greater 2nd set      Other Lumbar Stretch Exercise R thoracic rotation in L S/L, stabilizing R leg to prevent lumbar rotation x 15     Other Lumbar Stretch Exercise Self STM to R glute in supine with green ball with figure 4 S 2 min      Lumbar Exercises: Supine   Ab Set 10 reps;5 seconds      Manual Therapy   Manual Therapy Joint mobilization;Soft tissue mobilization;Passive ROM    Manual therapy comments prone    Joint Mobilization R lumbar UPAs    Soft tissue mobilization STM/DTM to R piriformis, passive pumping with hip IR/ER                       PT Long Term Goals - 04/24/20 0904      PT LONG TERM GOAL #1   Title Patient will be independent with ongoing/advanced HEP    Status Partially Met    Target Date 05/06/20      PT LONG TERM GOAL #2   Title  Patient to demonstrate appropriate spinal posture and body mechanics needed for daily activities    Status On-going    Target Date 05/06/20      PT LONG TERM GOAL #3   Title Patient to report low back and/or LE radiculopathy pain reduction in frequency and intensity by >/= 50%    Status On-going    Target Date 05/06/20      PT LONG TERM GOAL #4   Title Patient to improve lumbar AROM to WNL/WFL without pain provocation    Status On-going    Target Date 05/06/20      PT LONG TERM GOAL #5   Title Patient will demonstrate improved B LE strength to >/= 4+/5 for improved stability and ease of mobility    Status On-going    Target Date 05/06/20      PT LONG TERM GOAL #6   Title Patient to report ability to perform ADLs, household and work-related tasks without increased pain    Status On-going    Target Date 05/06/20                 Plan - 04/28/20 1034    Clinical Impression Statement Pt presents with continued R buttock/LE pain, but did feel some relief for a few days post DN at least session. Pt has not purchased home TENS unit yet, and based on continued peripheralization of pain to R calf, incorporated additional T/S  mobility and extension mobilization today. Following manual therapy, POE, press ups, and R thoracic rotation (which was very limited initially), pt reported centralization of RLE pain to upper glute. Pt additionally tolerated standing lumbar extension at plinth elevated to PSIS well with no s/s. Pt did reported some tingling into RLE after STM with green ball for over 2 minutes, but this dissipated as soon as pt performed some R thoracic rotation in L S/L. Provided updated HEP via email, per pt request and advised to continue with this stretching when feeling any peripheralization to RLE.    Personal Factors and Comorbidities Comorbidity 3+;Time since onset of injury/illness/exacerbation;Past/Current Experience;Fitness;Profession    Comorbidities Hypothyroidism, anxiety, migraines    Examination-Activity Limitations Bend;Caring for Others;Locomotion Level;Sit;Sleep;Stand;Stairs    Examination-Participation Restrictions Cleaning;Community Activity;Interpersonal Relationship;Laundry;Meal Prep;Shop    Rehab Potential Good    PT Frequency 2x / week    PT Duration 4 weeks    PT Treatment/Interventions ADLs/Self Care Home Management;Cryotherapy;Electrical Stimulation;Iontophoresis 60m/ml Dexamethasone;Moist Heat;Traction;Ultrasound;Functional mobility training;Therapeutic activities;Therapeutic exercise;Neuromuscular re-education;Patient/family education;Manual techniques;Passive range of motion;Dry needling;Taping;Spinal Manipulations;Joint Manipulations    PT Next Visit Plan expand HEP as tolerated; lumbar extension, lumbopelvic flexibility & strengthening; manual therapy for increased muscle tension with possible DN; modalities PRN    PT Home Exercise Plan 6/2 - pelvic tilts; 6/11 - hooklying glute sets, hooklying KTOS piriformis stretch, self-glute massage on wall with ball    Consulted and Agree with Plan of Care Patient           Patient will benefit from skilled therapeutic intervention in order to  improve the following deficits and impairments:  Decreased activity tolerance, Decreased knowledge of precautions, Decreased mobility, Decreased range of motion, Decreased strength, Difficulty walking, Increased fascial restricitons, Increased muscle spasms, Impaired perceived functional ability, Impaired flexibility, Improper body mechanics, Postural dysfunction, Pain  Visit Diagnosis: Chronic bilateral low back pain with right-sided sciatica  Abnormal posture  Muscle weakness (generalized)  Muscle spasm of back     Problem List Patient Active Problem List   Diagnosis Date Noted  . Allergic conjunctivitis of  both eyes 04/09/2020  . Shortness of breath 04/09/2020  . Drug reaction 09/11/2017  . Chronic sinusitis 08/29/2017  . Chronic rhinitis 08/02/2017  . Primary insomnia 08/02/2017  . Constipation 10/13/2016  . Sciatica, right 08/02/2016  . H/O vitamin D deficiency 06/08/2016  . Other allergic rhinitis 02/19/2016  . Visit for preventive health examination 06/18/2015  . Generalized anxiety disorder 05/21/2015  . Chronic migraine without aura without status migrainosus, not intractable 05/05/2015  . Sciatica 05/05/2015  . Hypothyroidism 01/06/2014  . Family history of breast cancer 12/27/2013  . Abdominal pain 07/26/2012  . Hypokalemia 07/26/2012    Izell [place]una, PT,DPT 04/28/2020, 12:15 PM  [facility] [address]  [place], [place], [address] Phone: [phone]   Fax:  [phone]  Name: CHISOM MUNTEAN MRN: [id number] Date of Birth: [birth year]

## 2020-04-30 ENCOUNTER — Other Ambulatory Visit: Payer: Self-pay

## 2020-04-30 ENCOUNTER — Ambulatory Visit (INDEPENDENT_AMBULATORY_CARE_PROVIDER_SITE_OTHER): Payer: Federal, State, Local not specified - PPO

## 2020-04-30 DIAGNOSIS — J309 Allergic rhinitis, unspecified: Secondary | ICD-10-CM

## 2020-04-30 MED ORDER — EPINEPHRINE 0.3 MG/0.3ML IJ SOAJ
0.3000 mg | Freq: Once | INTRAMUSCULAR | 2 refills | Status: DC | PRN
Start: 2020-04-30 — End: 2022-11-04

## 2020-04-30 NOTE — Progress Notes (Signed)
Immunotherapy   Patient Details  Name: Lisa Townsend MRN: [id number] Date of Birth: 05/12/1976  04/30/2020   Lisa Townsend here to start allergy injections. Patient received 0.05 of both her blue vials with an expiration of 04/16/2021. One with G-RW-T and the other with Molds. Following schedule: B Frequency: Weekly Epi-Pen: Yes Consent signed and patient instructions given.   Herbie Drape 04/30/2020, 9:35 AM

## 2020-05-01 ENCOUNTER — Ambulatory Visit: Payer: Federal, State, Local not specified - PPO | Admitting: Physical Therapy

## 2020-05-01 ENCOUNTER — Encounter: Payer: Self-pay | Admitting: Physical Therapy

## 2020-05-01 DIAGNOSIS — M6281 Muscle weakness (generalized): Secondary | ICD-10-CM

## 2020-05-01 DIAGNOSIS — R293 Abnormal posture: Secondary | ICD-10-CM | POA: Diagnosis not present

## 2020-05-01 DIAGNOSIS — M62830 Muscle spasm of back: Secondary | ICD-10-CM

## 2020-05-01 DIAGNOSIS — G8929 Other chronic pain: Secondary | ICD-10-CM | POA: Diagnosis not present

## 2020-05-01 DIAGNOSIS — M5441 Lumbago with sciatica, right side: Secondary | ICD-10-CM | POA: Diagnosis not present

## 2020-05-01 NOTE — Therapy (Signed)
[facility] [place] [address]  [place], [place], [address] Phone: [phone]   Fax:  [phone]  Physical Therapy Treatment  Patient Details  Name: Lisa Townsend MRN: [id number] Date of Birth: [birth year] Referring Provider (PT): Ivan Croft, MD   Encounter Date: 05/01/2020   PT End of Session - 05/01/20 0803    Visit Number 7    Number of Visits 8    Date for PT Re-Evaluation 05/06/20    Authorization Type Federal BCBS - VL = 50 (combined PT/OT/ST)    PT Start Time 0803    PT Stop Time 0907    PT Time Calculation (min) 64 min    Activity Tolerance Patient tolerated treatment well    Behavior During Therapy [facility] for tasks assessed/performed           Past Medical History:  Diagnosis Date  . Chronic constipation   . Hypothyroidism   . Migraine   . Urticaria   . Vitamin D deficiency     Past Surgical History:  Procedure Laterality Date  . BREAST BIOPSY Left   . NOSE SURGERY      There were no vitals filed for this visit.   Subjective Assessment - 05/01/20 0803    Subjective Pt reports pain less today an dmostly localized to R posteriro thigh.    Diagnostic tests Per pt, x-ray revealing lower lumbar DDD    Patient Stated Goals "to stop the pain"    Currently in Pain? Yes    Pain Score 4     Pain Location Leg    Pain Orientation Right;Posterior;Upper    Pain Descriptors / Indicators --   "just there"   Pain Type Chronic pain    Pain Frequency Constant                             [facility] Adult PT Treatment/Exercise - 05/01/20 0803      Exercises   Exercises Lumbar      Lumbar Exercises: Stretches   Prone on Elbows Stretch 60 seconds;2 reps    Prone on Elbows Stretch Limitations 2nd set - 2 min    Press Ups 10 reps      Modalities   Modalities Electrical Stimulation;Moist Heat      Moist Heat Therapy   Number Minutes Moist Heat 15 Minutes    Moist Heat Location Lumbar  Spine;Hip;Other (comment)   R buttocks & posterior thigh     Electrical Stimulation   Electrical Stimulation Location R buttocks & posterior thigh    Electrical Stimulation Action Pre-Mod    Electrical Stimulation Parameters 80-150Hz , intensity to pt tolerance x 15'    Electrical Stimulation Goals Pain;Tone      Manual Therapy   Manual Therapy Joint mobilization;Soft tissue mobilization;Myofascial release;Taping    Manual therapy comments skilled palpation and monitoring during DN     Joint Mobilization R lumbar UPAs    Soft tissue mobilization STM/DTM to B lumbar paraspinals, R glutes, pirforims    Myofascial Release TPR to lateral R glute medius & minimus, R piriformis; pin & stretch to B lumbar paraspinals    Kinesiotex Inhibit Muscle      Kinesiotix   Inhibit Muscle  B lumbar paraspinals 30% stretch + horiz strip 50% stretch across B PSIS            Trigger Point Dry Needling - 05/01/20 4196  Consent Given? Yes    Education Handout Provided Previously provided    Muscles Treated Back/Hip Erector spinae;Lumbar multifidi;Gluteus minimus;Gluteus medius    Gluteus Minimus Response Twitch response elicited;Palpable increased muscle length   Rt   Gluteus Medius Response Twitch response elicited;Palpable increased muscle length   Rt   Erector spinae Response Twitch response elicited;Palpable increased muscle length   Bilateral   Lumbar multifidi Response Twitch response elicited;Palpable increased muscle length   Bilateral               PT Education - 05/01/20 0845    Education Details Role of kinesiotape & wearing instructions    Person(s) Educated Patient    Methods Explanation;Handout    Comprehension Verbalized understanding               PT Long Term Goals - 04/24/20 0904      PT LONG TERM GOAL #1   Title Patient will be independent with ongoing/advanced HEP    Status Partially Met    Target Date 05/06/20      PT LONG TERM GOAL #2   Title Patient to  demonstrate appropriate spinal posture and body mechanics needed for daily activities    Status On-going    Target Date 05/06/20      PT LONG TERM GOAL #3   Title Patient to report low back and/or LE radiculopathy pain reduction in frequency and intensity by >/= 50%    Status On-going    Target Date 05/06/20      PT LONG TERM GOAL #4   Title Patient to improve lumbar AROM to WNL/WFL without pain provocation    Status On-going    Target Date 05/06/20      PT LONG TERM GOAL #5   Title Patient will demonstrate improved B LE strength to >/= 4+/5 for improved stability and ease of mobility    Status On-going    Target Date 05/06/20      PT LONG TERM GOAL #6   Title Patient to report ability to perform ADLs, household and work-related tasks without increased pain    Status On-going    Target Date 05/06/20                 Plan - 05/01/20 0847    Clinical Impression Statement Lisa Townsend reports pain still predominantly in R posterior thigh but less intense today with no more peripheralization into calf. No issues with latest HEP update. Benefit noted from prior DN with ongoing increased muscle tension present in B lumbar paraspinals and R glutes/piriformis, therefore continued manual therapy incorporating DN to lumbar paraspinals and R glute medius/minimus. Reviewed prone extension exercises but pt reporting no longer noting centralization of pain and noting some R anterior thigh pain not previously experienced. Initiated trial of kinesiotaping for lumbar paraspinals to provide support and decrease muscle tension with session concluded with estim and moist heat for further pain reduction.    Personal Factors and Comorbidities Comorbidity 3+;Time since onset of injury/illness/exacerbation;Past/Current Experience;Fitness;Profession    Comorbidities Hypothyroidism, anxiety, migraines    Examination-Activity Limitations Bend;Caring for Others;Locomotion Level;Sit;Sleep;Stand;Stairs     Examination-Participation Restrictions Cleaning;Community Activity;Interpersonal Relationship;Laundry;Meal Prep;Shop    Rehab Potential Good    PT Frequency 2x / week    PT Duration 4 weeks    PT Treatment/Interventions ADLs/Self Care Home Management;Cryotherapy;Electrical Stimulation;Iontophoresis 14m/ml Dexamethasone;Moist Heat;Traction;Ultrasound;Functional mobility training;Therapeutic activities;Therapeutic exercise;Neuromuscular re-education;Patient/family education;Manual techniques;Passive range of motion;Dry needling;Taping;Spinal Manipulations;Joint Manipulations    PT Next Visit Plan MD PN for appt  2/82 +/- Recert per pt preference; expand HEP as tolerated; lumbar extension, lumbopelvic flexibility & strengthening; manual therapy for increased muscle tension with possible DN; modalities PRN    PT Home Exercise Plan 6/2 - pelvic tilts; 6/11 - hooklying glute sets, hooklying KTOS piriformis stretch, self-glute massage on wall with ball; 6/22 - open book stretch, POE, prone press-ups (McKenzie extension progression)    Consulted and Agree with Plan of Care Patient           Patient will benefit from skilled therapeutic intervention in order to improve the following deficits and impairments:  Decreased activity tolerance, Decreased knowledge of precautions, Decreased mobility, Decreased range of motion, Decreased strength, Difficulty walking, Increased fascial restricitons, Increased muscle spasms, Impaired perceived functional ability, Impaired flexibility, Improper body mechanics, Postural dysfunction, Pain  Visit Diagnosis: Chronic bilateral low back pain with right-sided sciatica  Abnormal posture  Muscle weakness (generalized)  Muscle spasm of back     Problem List Patient Active Problem List   Diagnosis Date Noted  . Allergic conjunctivitis of both eyes 04/09/2020  . Shortness of breath 04/09/2020  . Drug reaction 09/11/2017  . Chronic sinusitis 08/29/2017  . Chronic  rhinitis 08/02/2017  . Primary insomnia 08/02/2017  . Constipation 10/13/2016  . Sciatica, right 08/02/2016  . H/O vitamin D deficiency 06/08/2016  . Other allergic rhinitis 02/19/2016  . Visit for preventive health examination 06/18/2015  . Generalized anxiety disorder 05/21/2015  . Chronic migraine without aura without status migrainosus, not intractable 05/05/2015  . Sciatica 05/05/2015  . Hypothyroidism 01/06/2014  . Family history of breast cancer 12/27/2013  . Abdominal pain 07/26/2012  . Hypokalemia 07/26/2012    Percival Spanish, PT, MPT 05/01/2020, 2:16 PM  [facility] [address]  [address] [place], [place], [address] Phone: [phone]   Fax:  [phone]  Name: Lisa Townsend MRN: [id number] Date of Birth: 07-14-1976

## 2020-05-01 NOTE — Patient Instructions (Signed)
   Kinesiology tape  What is kinesiology tape?  There are many brands of kinesiology tape. KTape, Rock Tape, Body Sport, Dynamic tape, to name a few.  It is an elasticized tape designed to support the body's natural healing process. This tape provides stability and support to muscles and joints without restricting motion.  It can also help decrease swelling in the area of application.  How does it work?  The tape microscopically lifts and decompresses the skin to allow for drainage of lymph (swelling) to flow away from area, reducing inflammation. The tape has the ability to help re-educate the neuromuscular system by targeting specific receptors in the skin. The presence of the tape increases the body's awareness of posture and body mechanics.  Do not use with:  . Open wounds . Skin lesions . Adhesive allergies  In some rare cases, mild/moderate skin irritation can occur. This can include redness, itchiness, or hives. If this occurs, immediately remove tape and consult your primary care physician if symptoms are severe or do not resolve within 2 days.  Safe removal of the tape:  To remove tape safely, hold nearby skin with one hand and gentle roll tape down with other hand. You can apply oil or conditioner to tape while in shower prior to removal to loosen adhesive. DO NOT swiftly rip tape off like a band-aid, as this could cause skin tears and additional skin irritation.     For questions, please contact your therapist at:  [facility] [place] [address]  [address] [place], [place], [address] Phone: [phone]   Fax:  [phone]     

## 2020-05-04 ENCOUNTER — Other Ambulatory Visit: Payer: Self-pay

## 2020-05-04 ENCOUNTER — Ambulatory Visit: Payer: Federal, State, Local not specified - PPO | Admitting: Physical Therapy

## 2020-05-04 ENCOUNTER — Encounter: Payer: Self-pay | Admitting: Physical Therapy

## 2020-05-04 DIAGNOSIS — M62830 Muscle spasm of back: Secondary | ICD-10-CM

## 2020-05-04 DIAGNOSIS — G8929 Other chronic pain: Secondary | ICD-10-CM

## 2020-05-04 DIAGNOSIS — R293 Abnormal posture: Secondary | ICD-10-CM | POA: Diagnosis not present

## 2020-05-04 DIAGNOSIS — M5441 Lumbago with sciatica, right side: Secondary | ICD-10-CM | POA: Diagnosis not present

## 2020-05-04 DIAGNOSIS — M6281 Muscle weakness (generalized): Secondary | ICD-10-CM

## 2020-05-04 NOTE — Patient Instructions (Signed)

## 2020-05-04 NOTE — Therapy (Signed)
[facility] [place], [place], [address] Phone: [phone]   Fax:  [phone]  Physical Therapy Treatment  Patient Details  Name: Lisa Townsend MRN: [id number] Date of Birth: 1976-03-24 Referring Provider (PT): Ivan Croft, MD   Encounter Date: 05/04/2020   PT End of Session - 05/04/20 0849    Visit Number 8    Number of Visits 12    Date for PT Re-Evaluation 06/29/20    Authorization Type Federal BCBS - VL = 50 (combined PT/OT/ST)    PT Start Time 0849    PT Stop Time 0938    PT Time Calculation (min) 49 min    Activity Tolerance Patient tolerated treatment well    Behavior During Therapy [facility] for tasks assessed/performed           Past Medical History:  Diagnosis Date   Chronic constipation    Hypothyroidism    Migraine    Urticaria    Vitamin D deficiency     Past Surgical History:  Procedure Laterality Date   BREAST BIOPSY Left    NOSE SURGERY      There were no vitals filed for this visit.   Subjective Assessment - 05/04/20 0853    Subjective Pt noting benefit from taping last session - feels like it helps to stay up straighter which lessens the pain in her leg.    Diagnostic tests Per pt, x-ray revealing lower lumbar DDD    Patient Stated Goals "to stop the pain"    Currently in Pain? 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, 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    Leisure reading, watching TV, was walking 5x/wk until a few weeks ago - stopped d/t pain and pain meds make her sleep more      Observation/Other Assessments   Focus on Therapeutic Outcomes (FOTO)  Lumbar - 75% (25% limitation)      AROM   Lumbar Flexion fingertips to floor - tightness in back of legs (R>L)    Lumbar Extension 10% limited    Lumbar - Right Side Bend hand to fibular head - L LBP    Lumbar - Left Side Bend hand to fibular head - R LBP    Lumbar - Right Rotation 10% limited    Lumbar - Left Rotation 10% limited      Strength   Right Hip Flexion 4/5    Right Hip Extension 4-/5    Right Hip External Rotation  4/5    Right Hip Internal Rotation 4/5    Right Hip ABduction 4/5    Right Hip ADduction 4/5  Left Hip Flexion 4/5    Left Hip Extension 4-/5    Left Hip External Rotation 4/5    Left Hip Internal Rotation 4+/5    Left Hip ABduction 4+/5    Left Hip ADduction 4+/5    Right Knee Flexion 4+/5    Right Knee Extension 5/5    Left Knee Flexion 5/5    Left Knee Extension 5/5    Right Ankle Dorsiflexion 4+/5    Right Ankle Plantar Flexion 4+/5    Left Ankle Dorsiflexion 4+/5    Left Ankle Plantar Flexion 5/5                         [facility] Adult PT Treatment/Exercise - 05/04/20 0846      Self-Care   Posture Provided education in proper posture and body mechanics for typical daily tasks, emphasizin review of desk posture and posture during standing activities as greatest discomfort still noted during prolonged standing.      Kinesiotix   Inhibit Muscle  B lumbar paraspinals 30% stretch + horiz strip 50% stretch across B PSIS                       PT Long Term Goals - 05/04/20 0913      PT LONG TERM GOAL #1   Title Patient will be independent with ongoing/advanced HEP    Status Achieved      PT LONG TERM GOAL #2   Title  Patient to demonstrate appropriate spinal posture and body mechanics needed for daily activities    Status Achieved      PT LONG TERM GOAL #3   Title Patient to report low back and/or LE radiculopathy pain reduction in frequency and intensity by >/= 50%    Status Achieved      PT LONG TERM GOAL #4   Title Patient to improve lumbar AROM to WNL/WFL without pain provocation    Status Partially Met    Target Date 06/29/20      PT LONG TERM GOAL #5   Title Patient will demonstrate improved B LE strength to >/= 4+/5 for improved stability and ease of mobility    Status Partially Met    Target Date 06/29/20      PT LONG TERM GOAL #6   Title Patient to report ability to perform ADLs, household and work-related tasks without increased pain    Status Partially Met    Target Date 06/29/20                 Plan - 05/04/20 0927    Clinical Impression Statement Lisa Townsend reports 50% improvement in R LE radiculopathy with pain becoming more centralized to low back in recent visits. Particular benefit noted from extension focused exercises, manual therapy including dry needling and kinesiotaping. Slight improvements noted in lumbar ROM with B side bending triggering contralateral low back pain. Overall LE strength improved on average by  grade on MMT but continued proximal LE and core weakness evident (R>L). Her functional activity tolerance is also improving as evidenced by reduction in FOTO to 25% limitation, but she still notes limited standing tolerance with household activities such as washing dishes. Given positive response to PT, Lisa Townsend has expressed interest in continuing with PT with frequency reduced to biweekly.    Personal Factors and Comorbidities Comorbidity 3+;Time since onset of injury/illness/exacerbation;Past/Current Experience;Fitness;Profession    Comorbidities Hypothyroidism, anxiety, migraines    Examination-Activity Limitations Bend;Caring for  Others;Locomotion  Level;Sit;Sleep;Stand;Stairs    Examination-Participation Restrictions Cleaning;Community Activity;Interpersonal Relationship;Laundry;Meal Prep;Shop    Rehab Potential Good    PT Frequency Biweekly    PT Duration 8 weeks    PT Treatment/Interventions ADLs/Self Care Home Management;Cryotherapy;Electrical Stimulation;Iontophoresis 34m/ml Dexamethasone;Moist Heat;Traction;Ultrasound;Functional mobility training;Therapeutic activities;Therapeutic exercise;Neuromuscular re-education;Patient/family education;Manual techniques;Passive range of motion;Dry needling;Taping;Spinal Manipulations;Joint Manipulations    PT Next Visit Plan expand HEP as tolerated; lumbar extension, lumbopelvic flexibility & strengthening; manual therapy for increased muscle tension with possible DN; modalities PRN    PT Home Exercise Plan 6/2 - pelvic tilts; 6/11 - hooklying glute sets, hooklying KTOS piriformis stretch, self-glute massage on wall with ball; 6/22 - open book stretch, POE, prone press-ups (McKenzie extension progression)    Consulted and Agree with Plan of Care Patient           Patient will benefit from skilled therapeutic intervention in order to improve the following deficits and impairments:  Decreased activity tolerance, Decreased knowledge of precautions, Decreased mobility, Decreased range of motion, Decreased strength, Difficulty walking, Increased fascial restricitons, Increased muscle spasms, Impaired perceived functional ability, Impaired flexibility, Improper body mechanics, Postural dysfunction, Pain  Visit Diagnosis: Chronic bilateral low back pain with right-sided sciatica  Abnormal posture  Muscle weakness (generalized)  Muscle spasm of back     Problem List Patient Active Problem List   Diagnosis Date Noted   Allergic conjunctivitis of both eyes 04/09/2020   Shortness of breath 04/09/2020   Drug reaction 09/11/2017   Chronic sinusitis 08/29/2017   Chronic rhinitis 08/02/2017     Primary insomnia 08/02/2017   Constipation 10/13/2016   Sciatica, right 08/02/2016   H/O vitamin D deficiency 06/08/2016   Other allergic rhinitis 02/19/2016   Visit for preventive health examination 06/18/2015   Generalized anxiety disorder 05/21/2015   Chronic migraine without aura without status migrainosus, not intractable 05/05/2015   Sciatica 05/05/2015   Hypothyroidism 01/06/2014   Family history of breast cancer 12/27/2013   Abdominal pain 07/26/2012   Hypokalemia 07/26/2012    JPercival Spanish PT, MPT 05/04/2020, 12:46 PM  [facility] [address] [address]Phone: [phone]  Fax:  [phone] Name: Lisa HANNAMRN: [id number] of Birth: [birth year]

## 2020-05-05 ENCOUNTER — Ambulatory Visit (INDEPENDENT_AMBULATORY_CARE_PROVIDER_SITE_OTHER): Payer: Federal, State, Local not specified - PPO

## 2020-05-05 ENCOUNTER — Ambulatory Visit: Payer: Federal, State, Local not specified - PPO | Admitting: Physical Therapy

## 2020-05-05 DIAGNOSIS — J309 Allergic rhinitis, unspecified: Secondary | ICD-10-CM | POA: Diagnosis not present

## 2020-05-05 DIAGNOSIS — M4726 Other spondylosis with radiculopathy, lumbar region: Secondary | ICD-10-CM | POA: Diagnosis not present

## 2020-05-05 DIAGNOSIS — M5431 Sciatica, right side: Secondary | ICD-10-CM | POA: Diagnosis not present

## 2020-05-05 DIAGNOSIS — M549 Dorsalgia, unspecified: Secondary | ICD-10-CM | POA: Diagnosis not present

## 2020-05-12 ENCOUNTER — Encounter: Payer: Self-pay | Admitting: *Deleted

## 2020-05-12 NOTE — Telephone Encounter (Signed)
Patient scheduled for appointment in September.

## 2020-05-14 ENCOUNTER — Ambulatory Visit (INDEPENDENT_AMBULATORY_CARE_PROVIDER_SITE_OTHER): Payer: Federal, State, Local not specified - PPO

## 2020-05-14 ENCOUNTER — Other Ambulatory Visit: Payer: Self-pay

## 2020-05-14 DIAGNOSIS — J309 Allergic rhinitis, unspecified: Secondary | ICD-10-CM

## 2020-05-21 ENCOUNTER — Ambulatory Visit (INDEPENDENT_AMBULATORY_CARE_PROVIDER_SITE_OTHER): Payer: Federal, State, Local not specified - PPO

## 2020-05-21 ENCOUNTER — Other Ambulatory Visit: Payer: Self-pay

## 2020-05-21 DIAGNOSIS — J309 Allergic rhinitis, unspecified: Secondary | ICD-10-CM | POA: Diagnosis not present

## 2020-05-22 ENCOUNTER — Ambulatory Visit: Payer: Federal, State, Local not specified - PPO | Admitting: Physical Therapy

## 2020-05-26 ENCOUNTER — Ambulatory Visit (INDEPENDENT_AMBULATORY_CARE_PROVIDER_SITE_OTHER): Payer: Federal, State, Local not specified - PPO

## 2020-05-26 ENCOUNTER — Other Ambulatory Visit: Payer: Self-pay

## 2020-05-26 DIAGNOSIS — J309 Allergic rhinitis, unspecified: Secondary | ICD-10-CM

## 2020-05-30 ENCOUNTER — Other Ambulatory Visit: Payer: Self-pay | Admitting: Physician Assistant

## 2020-06-01 MED ORDER — DIAZEPAM 10 MG PO TABS: ORAL_TABLET | ORAL | 0 refills | Status: DC

## 2020-06-01 NOTE — Telephone Encounter (Signed)
Last OV 02/21/20 Valium last filled 01/03/20 #60 with 0

## 2020-06-04 ENCOUNTER — Ambulatory Visit (INDEPENDENT_AMBULATORY_CARE_PROVIDER_SITE_OTHER): Payer: Federal, State, Local not specified - PPO

## 2020-06-04 ENCOUNTER — Other Ambulatory Visit: Payer: Self-pay

## 2020-06-04 DIAGNOSIS — J309 Allergic rhinitis, unspecified: Secondary | ICD-10-CM

## 2020-06-05 ENCOUNTER — Ambulatory Visit: Payer: Federal, State, Local not specified - PPO | Admitting: Physical Therapy

## 2020-06-08 ENCOUNTER — Other Ambulatory Visit (HOSPITAL_BASED_OUTPATIENT_CLINIC_OR_DEPARTMENT_OTHER): Payer: Self-pay | Admitting: Orthopaedic Surgery

## 2020-06-08 DIAGNOSIS — M4726 Other spondylosis with radiculopathy, lumbar region: Secondary | ICD-10-CM

## 2020-06-09 ENCOUNTER — Other Ambulatory Visit: Payer: Self-pay

## 2020-06-09 ENCOUNTER — Ambulatory Visit (INDEPENDENT_AMBULATORY_CARE_PROVIDER_SITE_OTHER): Payer: Federal, State, Local not specified - PPO

## 2020-06-09 DIAGNOSIS — J309 Allergic rhinitis, unspecified: Secondary | ICD-10-CM

## 2020-06-12 ENCOUNTER — Ambulatory Visit: Payer: Federal, State, Local not specified - PPO | Admitting: Physical Therapy

## 2020-06-12 ENCOUNTER — Other Ambulatory Visit: Payer: Self-pay

## 2020-06-12 ENCOUNTER — Ambulatory Visit: Payer: Federal, State, Local not specified - PPO | Attending: Orthopaedic Surgery | Admitting: Physical Therapy

## 2020-06-12 ENCOUNTER — Encounter: Payer: Self-pay | Admitting: Physical Therapy

## 2020-06-12 DIAGNOSIS — R293 Abnormal posture: Secondary | ICD-10-CM | POA: Diagnosis not present

## 2020-06-12 DIAGNOSIS — G8929 Other chronic pain: Secondary | ICD-10-CM | POA: Diagnosis not present

## 2020-06-12 DIAGNOSIS — M6281 Muscle weakness (generalized): Secondary | ICD-10-CM | POA: Diagnosis not present

## 2020-06-12 DIAGNOSIS — M62830 Muscle spasm of back: Secondary | ICD-10-CM | POA: Insufficient documentation

## 2020-06-12 DIAGNOSIS — M5441 Lumbago with sciatica, right side: Secondary | ICD-10-CM | POA: Diagnosis not present

## 2020-06-12 NOTE — Therapy (Signed)
[facility] [address]  [place], [place], [address] Phone: [phone]   Fax:  [phone]  Physical Therapy Recert / Treatment  Patient Details  Name: Lisa Townsend MRN: [id number] Date of Birth: 11-14-1975 Referring Provider (PT): Ivan Croft, MD   Encounter Date: 06/12/2020   PT End of Session - 06/12/20 0803    Visit Number 9    Number of Visits 21    Date for PT Re-Evaluation 07/24/20    Authorization Type Federal [employer] - VL = 50 (combined PT/OT/ST)    PT Start Time 0803    PT Stop Time 0853    PT Time Calculation (min) 50 min    Activity Tolerance Patient tolerated treatment well    Behavior During Therapy [facility] for tasks assessed/performed           Past Medical History:  Diagnosis Date  . Chronic constipation   . Hypothyroidism   . Migraine   . Urticaria   . Vitamin D deficiency     Past Surgical History:  Procedure Laterality Date  . BREAST BIOPSY Left   . NOSE SURGERY      There were no vitals filed for this visit.   Subjective Assessment - 06/12/20 0805    Subjective Pt reports pain has been fluctuating over the past month. Pain moves around through back and buttocks with LE radiculopathy now more intermittent than previously, but now noting more upper back and neck/shoulder pain. She reports she does HEP qod. She is scheduled for a lumbar MRI tomorrow.    Limitations Sitting;Standing    How long can you sit comfortably? a couple hours    How long can you stand comfortably? 20-30 minutes    Diagnostic tests Per pt, x-ray revealing lower lumbar DDD    Patient Stated Goals "to stop the pain"    Currently in Pain? Yes    Pain Score 5     Pain Location Buttocks    Pain Orientation Right;Posterior    Pain Descriptors / Indicators Sharp    Pain Type Chronic pain    Pain Radiating Towards none currently but intermittent pain down back of R LE into calf up to 8/10 at worst    Pain Onset  Other (comment)   20+ yrs   Pain Frequency Constant    Aggravating Factors  prolonged sitting or standing    Pain Relieving Factors leg workouts, ball massage, pain pills - only temporary relief    Multiple Pain Sites Yes    Pain Score 7    Pain Location Back   + neck & shoulders   Pain Orientation Upper;Right;Left    Pain Descriptors / Indicators Constant    Pain Type Chronic pain    Pain Radiating Towards intermittent pain and numbness into B UEs to elbows    Pain Onset Other (comment)   "couple years" - since I started working on the computer   Pain Frequency Constant    Aggravating Factors  working on computer    Pain Relieving Factors relaxing              [facility] PT Assessment - 06/12/20 0803      Assessment   Medical Diagnosis Lumbar spondylosis with radiculopathy    Referring Provider (PT) Ivan Croft, MD    Onset Date/Surgical Date --   20+ yrs   Hand Dominance Right      Sensation   Additional Comments intermittent numbness and  pain into B UEs to elbows      Posture/Postural Control   Posture/Postural Control Postural limitations    Postural Limitations Forward head;Rounded Shoulders   R shoulder elevated     Palpation   Palpation comment increased muscle tension and TPs in B UT, LS and cervicothoracic paraspinals (R>L)                         [facility] Adult PT Treatment/Exercise - 06/12/20 0803      Exercises   Exercises Lumbar      Neck Exercises: Seated   Neck Retraction 10 reps;5 secs    Other Seated Exercise Scap retraction + depression 10 x 5"      Manual Therapy   Manual Therapy Joint mobilization;Soft tissue mobilization;Myofascial release;Other (comment)    Manual therapy comments skilled palpation and monitoring during DN     Joint Mobilization R 1st rib mobilization    Soft tissue mobilization STM/DTM to B UT & R>L LS     Myofascial Release manual TPR to B UT & R LS      Other Manual Therapy Instruction provided in self-mobs  for 1st rib with neck/upper shoulder stretches      Neck Exercises: Stretches   Upper Trapezius Stretch Right;Left;30 seconds;2 reps    Levator Stretch Right;Left;30 seconds;2 reps    Other Neck Stretches R 1st rib self-mob with towel + lateral neck flexion 5 x 10"            Trigger Point Dry Needling - 06/12/20 0803    Consent Given? Yes    Education Handout Provided Previously provided    Muscles Treated Head and Neck Upper trapezius;Levator scapulae    Upper Trapezius Response Twitch reponse elicited;Palpable increased muscle length   bilateral   Levator Scapulae Response Twitch response elicited;Palpable increased muscle length   Rt               PT Education - 06/12/20 0850    Education Details HEP update - neck stretches, postural strengthening    Person(s) Educated Patient    Methods Explanation;Demonstration;Verbal cues;Handout    Comprehension Verbalized understanding;Returned demonstration;Verbal cues required;Need further instruction               PT Long Term Goals - 06/12/20 0817      PT LONG TERM GOAL #1   Title Patient will be independent with ongoing/advanced HEP    Status Partially Met    Target Date 07/24/20      PT LONG TERM GOAL #2   Title Patient to demonstrate appropriate spinal posture and body mechanics needed for daily activities    Status On-going    Target Date 07/24/20      PT LONG TERM GOAL #3   Title Patient to report low back and/or LE radiculopathy pain reduction in frequency and intensity by >/= 50%    Status Achieved      PT LONG TERM GOAL #4   Title Patient to improve lumbar AROM to WNL/WFL without pain provocation    Status Partially Met    Target Date 07/24/20      PT LONG TERM GOAL #5   Title Patient will demonstrate improved B LE strength to >/= 4+/5 for improved stability and ease of mobility    Status Partially Met    Target Date 07/24/20      PT LONG TERM GOAL #6   Title Patient to report ability to perform  ADLs, household and work-related tasks without increased pain    Status Partially Met    Target Date 07/24/20                 Plan - 06/12/20 0817    Clinical Impression Statement Lisa Townsend returning to PT for the 1st time in >30 days following planned reduction of PT frequency to biweekly as of 05/04/20 due to early benefit noted from PT. She reports pain has been fluctuating over the past month with pain moving around through back and buttocks and LE radiculopathy now more intermittent than previously, but now noting more upper back and neck/shoulder pain with intermittent UE radiculopathy. She demonstrates significant forward head and shoulder posture with elevated R shoulder and 1st rib despite prior training in neutral spine posture and proper body mechanics. Addressed new complaints with MT incorporating DN followed by HEP instruction in stretches and postural strengthening for upper back/neck and scapula/shoulders. Given ongoing low back pain and radiculopathy along with worsening cervicothoracic pain and radiculopathy, recommended resuming 1-2x/wk frequency with pt opting to try 1x/wk.    Personal Factors and Comorbidities Comorbidity 3+;Time since onset of injury/illness/exacerbation;Past/Current Experience;Fitness;Profession    Comorbidities Hypothyroidism, anxiety, migraines    Examination-Activity Limitations Bend;Caring for Others;Locomotion Level;Sit;Sleep;Stand;Stairs    Examination-Participation Restrictions Cleaning;Community Activity;Interpersonal Relationship;Laundry;Meal Prep;Shop    Rehab Potential Good    PT Frequency 2x / week   1-2x/wk - pt wishing to try 1x/wk to start   PT Duration 6 weeks    PT Treatment/Interventions ADLs/Self Care Home Management;Cryotherapy;Electrical Stimulation;Iontophoresis 33m/ml Dexamethasone;Moist Heat;Traction;Ultrasound;Functional mobility training;Therapeutic activities;Therapeutic exercise;Neuromuscular re-education;Patient/family  education;Manual techniques;Passive range of motion;Dry needling;Taping;Spinal Manipulations;Joint Manipulations    PT Next Visit Plan expand HEP as tolerated; lumbar extension; postural strengthening, lumbopelvic flexibility & strengthening; manual therapy for increased muscle tension with possible DN; modalities PRN    PT Home Exercise Plan 6/2 - pelvic tilts; 6/11 - hooklying glute sets, hooklying KTOS piriformis stretch, self-glute massage on wall with ball; 6/22 - open book stretch, POE, prone press-ups (McKenzie extension progression); 8/6 - chin tuck, UT/LS stretches, scap retraction, 1st rib self-mobs    Consulted and Agree with Plan of Care Patient           Patient will benefit from skilled therapeutic intervention in order to improve the following deficits and impairments:  Decreased activity tolerance, Decreased knowledge of precautions, Decreased mobility, Decreased range of motion, Decreased strength, Difficulty walking, Increased fascial restricitons, Increased muscle spasms, Impaired perceived functional ability, Impaired flexibility, Improper body mechanics, Postural dysfunction, Pain  Visit Diagnosis: Chronic bilateral low back pain with right-sided sciatica  Abnormal posture  Muscle weakness (generalized)  Muscle spasm of back     Problem List Patient Active Problem List   Diagnosis Date Noted  . Allergic conjunctivitis of both eyes 04/09/2020  . Shortness of breath 04/09/2020  . Drug reaction 09/11/2017  . Chronic sinusitis 08/29/2017  . Chronic rhinitis 08/02/2017  . Primary insomnia 08/02/2017  . Constipation 10/13/2016  . Sciatica, right 08/02/2016  . H/O vitamin D deficiency 06/08/2016  . Other allergic rhinitis 02/19/2016  . Visit for preventive health examination 06/18/2015  . Generalized anxiety disorder 05/21/2015  . Chronic migraine without aura without status migrainosus, not intractable 05/05/2015  . Sciatica 05/05/2015  . Hypothyroidism  01/06/2014  . Family history of breast cancer 12/27/2013  . Abdominal pain 07/26/2012  . Hypokalemia 07/26/2012    JPercival Spanish PT, MPT 06/12/2020, 1:54 PM  [facility] [address]  [place], [place], [address] Phone: [phone]   Fax:  [phone]  Name: Lisa Townsend MRN: [id number] Date of Birth: June 24, 1976

## 2020-06-12 NOTE — Patient Instructions (Signed)
    Home exercise program created by Jama Mcmiller, PT.  For questions, please contact Minami Arriaga via phone at [phone] or email at [email or web address]  [facility] [address]  [address] [place], [place], [address] Phone: [phone]   Fax:  [phone]    

## 2020-06-13 ENCOUNTER — Ambulatory Visit (HOSPITAL_BASED_OUTPATIENT_CLINIC_OR_DEPARTMENT_OTHER)
Admission: RE | Admit: 2020-06-13 | Discharge: 2020-06-13 | Disposition: A | Discharge status: Home / Self Care | Payer: Federal, State, Local not specified - PPO | Source: Ambulatory Visit | Attending: Orthopaedic Surgery | Admitting: Orthopaedic Surgery

## 2020-06-13 DIAGNOSIS — M5127 Other intervertebral disc displacement, lumbosacral region: Secondary | ICD-10-CM | POA: Diagnosis not present

## 2020-06-13 DIAGNOSIS — M48061 Spinal stenosis, lumbar region without neurogenic claudication: Secondary | ICD-10-CM | POA: Diagnosis not present

## 2020-06-13 DIAGNOSIS — M5126 Other intervertebral disc displacement, lumbar region: Secondary | ICD-10-CM | POA: Diagnosis not present

## 2020-06-13 DIAGNOSIS — M4726 Other spondylosis with radiculopathy, lumbar region: Secondary | ICD-10-CM | POA: Insufficient documentation

## 2020-06-13 DIAGNOSIS — M47816 Spondylosis without myelopathy or radiculopathy, lumbar region: Secondary | ICD-10-CM | POA: Diagnosis not present

## 2020-06-16 ENCOUNTER — Other Ambulatory Visit: Payer: Self-pay

## 2020-06-16 ENCOUNTER — Ambulatory Visit (INDEPENDENT_AMBULATORY_CARE_PROVIDER_SITE_OTHER): Payer: Federal, State, Local not specified - PPO

## 2020-06-16 DIAGNOSIS — J309 Allergic rhinitis, unspecified: Secondary | ICD-10-CM

## 2020-06-19 ENCOUNTER — Ambulatory Visit: Payer: Federal, State, Local not specified - PPO | Admitting: Physical Therapy

## 2020-06-25 ENCOUNTER — Ambulatory Visit (INDEPENDENT_AMBULATORY_CARE_PROVIDER_SITE_OTHER): Payer: Federal, State, Local not specified - PPO

## 2020-06-25 ENCOUNTER — Other Ambulatory Visit: Payer: Self-pay

## 2020-06-25 DIAGNOSIS — J309 Allergic rhinitis, unspecified: Secondary | ICD-10-CM | POA: Diagnosis not present

## 2020-06-26 ENCOUNTER — Ambulatory Visit: Payer: Federal, State, Local not specified - PPO | Admitting: Physical Therapy

## 2020-06-26 ENCOUNTER — Encounter: Payer: Self-pay | Admitting: Physical Therapy

## 2020-06-26 DIAGNOSIS — M6281 Muscle weakness (generalized): Secondary | ICD-10-CM

## 2020-06-26 DIAGNOSIS — M5441 Lumbago with sciatica, right side: Secondary | ICD-10-CM

## 2020-06-26 DIAGNOSIS — G8929 Other chronic pain: Secondary | ICD-10-CM

## 2020-06-26 DIAGNOSIS — R293 Abnormal posture: Secondary | ICD-10-CM

## 2020-06-26 DIAGNOSIS — M62830 Muscle spasm of back: Secondary | ICD-10-CM | POA: Diagnosis not present

## 2020-06-26 NOTE — Therapy (Addendum)
[facility] [address]  [place], [place], [address] Phone: [phone]   Fax:  [phone]  Physical Therapy Treatment / Discharge Summary  Patient Details  Name: Lisa Townsend MRN: [id number] Date of Birth: 1976/05/30 Referring Provider (PT): Ivan Croft, MD   Encounter Date: 06/26/2020   PT End of Session - 06/26/20 0805    Visit Number 10    Number of Visits 21    Date for PT Re-Evaluation 07/24/20    Authorization Type Federal [employer] - VL = 50 (combined PT/OT/ST)    PT Start Time 0805    PT Stop Time 0848    PT Time Calculation (min) 43 min    Activity Tolerance Patient tolerated treatment well    Behavior During Therapy [facility] for tasks assessed/performed           Past Medical History:  Diagnosis Date  . Chronic constipation   . Hypothyroidism   . Migraine   . Urticaria   . Vitamin D deficiency     Past Surgical History:  Procedure Laterality Date  . BREAST BIOPSY Left   . NOSE SURGERY      There were no vitals filed for this visit.   Subjective Assessment - 06/26/20 0808    Subjective Pt reports ongoing pain despite trying to be more aware of her posture and having her husband massage her neck.    Diagnostic tests Per pt, x-ray revealing lower lumbar DDD    Patient Stated Goals "to stop the pain"    Currently in Pain? Yes    Pain Score 6     Pain Location Back    Pain Orientation Right;Lower    Pain Descriptors / Indicators Constant    Pain Type Chronic pain    Pain Radiating Towards 7-8/10 into R thigh    Pain Onset Other (comment)   20+ yrs   Pain Frequency Constant    Pain Score 8    Pain Location Back    Pain Orientation Upper    Pain Descriptors / Indicators Aching;Heaviness    Pain Type Chronic pain    Pain Radiating Towards upper shoulders    Pain Onset Other (comment)   "couple years" - since I started working on the computer   Pain Frequency Constant                              [facility] Adult PT Treatment/Exercise - 06/26/20 0805      Manual Therapy   Manual Therapy Soft tissue mobilization;Myofascial release    Manual therapy comments skilled palpation and monitoring during DN     Soft tissue mobilization STM/DTM to B UT, LS, rhomboids and cervicothoracic paraspinals     Myofascial Release manual TPR to B UT & LS, L rhomboids; pin & stretch to B cerivcal & upper thoracic paraspinals              Trigger Point Dry Needling - 06/26/20 0805    Consent Given? Yes    Muscles Treated Head and Neck Upper trapezius;Levator scapulae;Cervical multifidi   bilateral   Muscles Treated Upper Quadrant Rhomboids   Lt   Upper Trapezius Response Twitch reponse elicited;Palpable increased muscle length    Levator Scapulae Response Twitch response elicited;Palpable increased muscle length    Cervical multifidi Response Twitch reponse elicited;Palpable increased muscle length    Rhomboids Response Twitch response elicited;Palpable increased muscle length  PT Long Term Goals - 06/12/20 0817      PT LONG TERM GOAL #1   Title Patient will be independent with ongoing/advanced HEP    Status Partially Met    Target Date 07/24/20      PT LONG TERM GOAL #2   Title Patient to demonstrate appropriate spinal posture and body mechanics needed for daily activities    Status On-going    Target Date 07/24/20      PT LONG TERM GOAL #3   Title Patient to report low back and/or LE radiculopathy pain reduction in frequency and intensity by >/= 50%    Status Achieved      PT LONG TERM GOAL #4   Title Patient to improve lumbar AROM to WNL/WFL without pain provocation    Status Partially Met    Target Date 07/24/20      PT LONG TERM GOAL #5   Title Patient will demonstrate improved B LE strength to >/= 4+/5 for improved stability and ease of mobility    Status Partially Met    Target Date 07/24/20      PT LONG TERM  GOAL #6   Title Patient to report ability to perform ADLs, household and work-related tasks without increased pain    Status Partially Met    Target Date 07/24/20                 Plan - 06/26/20 0814    Clinical Impression Statement Lisa Townsend notes some relief with stretches from most recent HEP addition but continues to report ongoing neck/upper back/shoulder pain as well as low back pain and R LE radiculopathy. Her attendance with PT has been limited due to need for 8:00 am time slots and lack of availability at that time. She denies need for review of latest HEP update, wanting to focus on MT and DN as she feels that this gives her the best relief. Multiple taut bands and TP addressed in cervicothoracic musculature today with good twitch responses elicited. Pt will benefit from training in further postural and scapular strengthening next visit to reinforce desired muscle activity.    Personal Factors and Comorbidities Comorbidity 3+;Time since onset of injury/illness/exacerbation;Past/Current Experience;Fitness;Profession    Comorbidities Hypothyroidism, anxiety, migraines    Examination-Activity Limitations Bend;Caring for Others;Locomotion Level;Sit;Sleep;Stand;Stairs    Examination-Participation Restrictions Cleaning;Community Activity;Interpersonal Relationship;Laundry;Meal Prep;Shop    Rehab Potential Good    PT Frequency 2x / week   1-2x/wk - pt wishing to try 1x/wk to start   PT Duration 6 weeks    PT Treatment/Interventions ADLs/Self Care Home Management;Cryotherapy;Electrical Stimulation;Iontophoresis 28m/ml Dexamethasone;Moist Heat;Traction;Ultrasound;Functional mobility training;Therapeutic activities;Therapeutic exercise;Neuromuscular re-education;Patient/family education;Manual techniques;Passive range of motion;Dry needling;Taping;Spinal Manipulations;Joint Manipulations    PT Next Visit Plan expand HEP as tolerated - postural strengthening, lumbopelvic flexibility &  strengthening, lumbar extension; manual therapy for increased muscle tension with DN as indicated; modalities PRN    PT Home Exercise Plan 6/2 - pelvic tilts; 6/11 - hooklying glute sets, hooklying KTOS piriformis stretch, self-glute massage on wall with ball; 6/22 - open book stretch, POE, prone press-ups (McKenzie extension progression); 8/6 - chin tuck, UT/LS stretches, scap retraction, 1st rib self-mobs    Consulted and Agree with Plan of Care Patient           Patient will benefit from skilled therapeutic intervention in order to improve the following deficits and impairments:  Decreased activity tolerance, Decreased knowledge of precautions, Decreased mobility, Decreased range of motion, Decreased strength, Difficulty walking, Increased fascial restricitons, Increased  muscle spasms, Impaired perceived functional ability, Impaired flexibility, Improper body mechanics, Postural dysfunction, Pain  Visit Diagnosis: Chronic bilateral low back pain with right-sided sciatica  Abnormal posture  Muscle weakness (generalized)  Muscle spasm of back     Problem List Patient Active Problem List   Diagnosis Date Noted  . Allergic conjunctivitis of both eyes 04/09/2020  . Shortness of breath 04/09/2020  . Drug reaction 09/11/2017  . Chronic sinusitis 08/29/2017  . Chronic rhinitis 08/02/2017  . Primary insomnia 08/02/2017  . Constipation 10/13/2016  . Sciatica, right 08/02/2016  . H/O vitamin D deficiency 06/08/2016  . Other allergic rhinitis 02/19/2016  . Visit for preventive health examination 06/18/2015  . Generalized anxiety disorder 05/21/2015  . Chronic migraine without aura without status migrainosus, not intractable 05/05/2015  . Sciatica 05/05/2015  . Hypothyroidism 01/06/2014  . Family history of breast cancer 12/27/2013  . Abdominal pain 07/26/2012  . Hypokalemia 07/26/2012    Percival Spanish, PT, MPT 06/26/2020, 12:48 PM  [facility] [address]  [place], [place], [address] Phone: [phone]   Fax:  [phone]  Name: Lisa Townsend MRN: [id number] Date of Birth: 1976/01/29  PHYSICAL THERAPY DISCHARGE SUMMARY  Visits from Start of Care: 10  Current functional level related to goals / functional outcomes:   Refer to above clinical impression for status as of last visit on 06/26/2020. Patient cancelled all remaining appointments due to a change in her work schedule and has not returned in >30 days, therefore will proceed with discharge from PT for this episode.   Remaining deficits:   Unable to formally assess due to unable to return to PT.    Education / Equipment:   HEP  Plan: Patient agrees to discharge.  Patient goals were partially met. Patient is being discharged due to not returning since the last visit.  ?????     Percival Spanish, PT, MPT 08/03/20, 9:13 AM  [facility] [address]  [place], [place], [address] Phone: [phone]   Fax:  [phone]

## 2020-06-30 ENCOUNTER — Other Ambulatory Visit: Payer: Self-pay

## 2020-06-30 ENCOUNTER — Ambulatory Visit (INDEPENDENT_AMBULATORY_CARE_PROVIDER_SITE_OTHER): Payer: Federal, State, Local not specified - PPO

## 2020-06-30 DIAGNOSIS — J309 Allergic rhinitis, unspecified: Secondary | ICD-10-CM | POA: Diagnosis not present

## 2020-07-03 ENCOUNTER — Ambulatory Visit: Payer: Federal, State, Local not specified - PPO | Admitting: Physical Therapy

## 2020-07-09 ENCOUNTER — Ambulatory Visit (INDEPENDENT_AMBULATORY_CARE_PROVIDER_SITE_OTHER): Payer: Federal, State, Local not specified - PPO

## 2020-07-09 ENCOUNTER — Other Ambulatory Visit: Payer: Self-pay

## 2020-07-09 DIAGNOSIS — J309 Allergic rhinitis, unspecified: Secondary | ICD-10-CM | POA: Diagnosis not present

## 2020-07-10 ENCOUNTER — Encounter: Payer: Federal, State, Local not specified - PPO | Admitting: Physical Therapy

## 2020-07-14 ENCOUNTER — Other Ambulatory Visit: Payer: Self-pay

## 2020-07-14 ENCOUNTER — Ambulatory Visit (INDEPENDENT_AMBULATORY_CARE_PROVIDER_SITE_OTHER): Payer: Federal, State, Local not specified - PPO

## 2020-07-14 ENCOUNTER — Ambulatory Visit: Payer: Federal, State, Local not specified - PPO | Admitting: Allergy

## 2020-07-14 DIAGNOSIS — J309 Allergic rhinitis, unspecified: Secondary | ICD-10-CM

## 2020-07-14 DIAGNOSIS — J3089 Other allergic rhinitis: Secondary | ICD-10-CM | POA: Insufficient documentation

## 2020-07-14 DIAGNOSIS — H1010 Acute atopic conjunctivitis, unspecified eye: Secondary | ICD-10-CM | POA: Insufficient documentation

## 2020-07-14 NOTE — Progress Notes (Deleted)
Follow Up Note  RE: Lisa Townsend MRN: [id number] DOB: 10-19-1976 Date of Office Visit: 07/14/2020  Referring provider: Brunetta Jeans, PA-C Primary care provider: Brunetta Jeans, PA-C  Chief Complaint: No chief complaint on file.  History of Present Illness: I had the pleasure of seeing Lisa Townsend for a follow up visit at the [facility] on 07/14/2020. She is a 44 y.o. female, who is being followed for allergic rhino conjunctivitis on AIT and shortness of breath. Her previous allergy office visit was on 04/09/2020 with Dr. Maudie Townsend. Today is a regular follow up visit.  04/30/2020   Lisa Townsend here to start allergy injections. Patient received 0.05 of both her blue vials with an expiration of 04/16/2021. One with G-RW-T and the other with Molds. Following schedule: B Frequency: Weekly  Other allergic rhinitis Perennial rhino conjunctivitis symptoms for the past few years but worsening each year. Spring and summer is the worst. Tried Zyrtec, Allegra, Flonase with minimal benefit. No previous allergy testing. 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 + reflux. Does not tolerate Claritin.  Today's skin testing showed: Positive to grass, weed, ragweed, trees, mold.   Start environmental control measures as below.  May use over the counter antihistamines such as Zyrtec (cetirizine), Allegra (fexofenadine), or Xyzal (levocetirizine) daily as needed - samples given.   Start Nasacort 1 spray per nostril twice a day for nasal symptoms. Sample given. ? If it's not covered by your insurance then let us know.   Nasal saline spray (i.e., Simply Saline) or nasal saline lavage (i.e., NeilMed) is recommended as needed and prior to medicated nasal sprays.  May use olopatadine eye drops 0.2% once a day as needed for itchy/watery eyes.  Check with insurance about injection coverage and let us know when ready to start.  Had a detailed discussion with  patient/family that clinical history is suggestive of allergic rhinitis, and may benefit from allergy immunotherapy (AIT). Discussed in detail regarding the dosing, schedule, side effects (mild to moderate local allergic reaction and rarely systemic allergic reactions including anaphylaxis), and benefits (significant improvement in nasal symptoms, seasonal flares of asthma) of immunotherapy with the patient. There is significant time commitment involved with allergy shots, which includes weekly immunotherapy injections for first 9-12 months and then biweekly to monthly injections for 3-5 years. Consent was signed.  Allergic conjunctivitis of both eyes  See assessment and plan as above for allergic rhinitis.   Shortness of breath Daily shortness of breath for 3-4 months. No triggers noted. No prior diagnosis of asthma. Used albuterol during her COVID-19 infection with unknown benefit. No recent CXR. CBC diff, TSH normal in December 2020.   Today's spirometry showed: possible restrictive disease with 10% improvement in FEV1 post bronchodilator treatment. Clinically feeling some improvement.  Discussed with patient that given above spirometry results concerning for inflammation/reactive airway disease. Whether this is due to post COVID-19 infection versus her environmental allergies is hard to tell at this time.   Daily controller medication(s):start Symbicort 35mcg 2 puffs twice a day with spacer and rinse mouth afterwards.  Spacer given and demonstrated proper use with inhaler. Patient understood technique and all questions/concerned were addressed.   May use albuterol rescue inhaler 2 puffs every 4 to 6 hours as needed for shortness of breath, chest tightness, coughing, and wheezing. May use albuterol rescue inhaler 2 puffs 5 to 15 minutes prior to strenuous physical activities. Monitor frequency of use.   Repeat spirometry at next  visit.   Return in about 3 months (around  07/10/2020).  Assessment and Plan: Lisa Townsend is a 44 y.o. female with: No problem-specific Assessment & Plan notes found for this encounter.  No follow-ups on file.  No orders of the defined types were placed in this encounter.  Lab Orders  No laboratory test(s) ordered today    Diagnostics: Spirometry:  Tracings reviewed. Her effort: {Blank single:19197::"Good reproducible efforts.","It was hard to get consistent efforts and there is a question as to whether this reflects a maximal maneuver.","Poor effort, data can not be interpreted."} FVC: ***L FEV1: ***L, ***% predicted FEV1/FVC ratio: ***% Interpretation: {Blank single:19197::"Spirometry consistent with mild obstructive disease","Spirometry consistent with moderate obstructive disease","Spirometry consistent with severe obstructive disease","Spirometry consistent with possible restrictive disease","Spirometry consistent with mixed obstructive and restrictive disease","Spirometry uninterpretable due to technique","Spirometry consistent with normal pattern","No overt abnormalities noted given today's efforts"}.  Please see scanned spirometry results for details.  Skin Testing: {Blank single:19197::"Select foods","Environmental allergy panel","Environmental allergy panel and select foods","Food allergy panel","None","Deferred due to recent antihistamines use"}. Positive test to: ***. Negative test to: ***.  Results discussed with patient/family.   Medication List:  Current Outpatient Medications  Medication Sig Dispense Refill   budesonide-formoterol (SYMBICORT) 80-4.5 MCG/ACT inhaler Inhale 2 puffs into the lungs in the morning and at bedtime. with spacer and rinse mouth afterwards. 1 Inhaler 5   cetirizine (ZYRTEC) 10 MG tablet Take 10 mg by mouth daily.     diazepam (VALIUM) 10 MG tablet TAKE 1 TABLET BY MOUTH EVERY 12 HOURS AS NEEDED FOR ANXIETY. 60 tablet 0   diclofenac (VOLTAREN) 75 MG EC tablet Take 1 tablet (75 mg total)  by mouth 2 (two) times daily. 60 tablet 0   EPINEPHrine 0.3 mg/0.3 mL IJ SOAJ injection Inject 0.3 mLs (0.3 mg total) into the muscle once as needed for up to 1 dose for anaphylaxis. 0.3 mL 2   gabapentin (NEURONTIN) 300 MG capsule Take 600 mg by mouth at bedtime.     hydrOXYzine (ATARAX/VISTARIL) 50 MG tablet Take 1 tablet (50 mg total) by mouth at bedtime as needed (insomnia). 30 tablet 0   levothyroxine (SYNTHROID) 75 MCG tablet TAKE 1 TABLET BY MOUTH DAILY BEFORE BREAKFAST. 90 tablet 1   nitrofurantoin (MACRODANTIN) 50 MG capsule Take as needed with intercourse 30 capsule 1   Olopatadine HCl 0.2 % SOLN Apply 1 drop to eye daily as needed (itchy/watery eyes). 2.5 mL 5   ondansetron (ZOFRAN-ODT) 8 MG disintegrating tablet For further refills, please schedule an appointment with provider. 20 tablet 0   pregabalin (LYRICA) 75 MG capsule Take 75 mg by mouth 2 (two) times daily.     rizatriptan (MAXALT) 10 MG tablet TAKE 1 TABLET BY MOUTH AS NEEDED FOR MIGRAINE. MAY REPEAT IN 2 HOURS IF NEEDED 10 tablet 3   triamcinolone (NASACORT) 55 MCG/ACT AERO nasal inhaler Place 1 spray into the nose 2 (two) times daily. For nasal symptoms. 1 Inhaler 5   No current facility-administered medications for this visit.   Allergies: Allergies  Allergen Reactions   Claritin [Loratadine]     shakiness   Lexapro [Escitalopram] Nausea Only   I reviewed her past medical history, social history, family history, and environmental history and no significant changes have been reported from her previous visit.  Review of Systems  Constitutional: Negative for appetite change, chills, fever and unexpected weight change.  HENT: Positive for congestion, postnasal drip, rhinorrhea and sneezing.   Eyes: Positive for itching.  Respiratory: Positive for shortness of  breath. Negative for cough, chest tightness and wheezing.   Cardiovascular: Negative for chest pain.  Gastrointestinal: Positive for constipation.  Negative for abdominal pain.  Genitourinary: Negative for difficulty urinating.  Skin: Negative for rash.  Allergic/Immunologic: Positive for environmental allergies. Negative for food allergies.  Neurological: Positive for headaches.   Objective: There were no vitals taken for this visit. There is no height or weight on file to calculate BMI. Physical Exam Vitals and nursing note reviewed.  Constitutional:      Appearance: Normal appearance. She is well-developed.  HENT:     Head: Normocephalic and atraumatic.     Right Ear: External ear normal.     Left Ear: External ear normal.     Nose: Nose normal.     Mouth/Throat:     Mouth: Mucous membranes are moist.     Pharynx: Oropharynx is clear.  Eyes:     Conjunctiva/sclera: Conjunctivae normal.  Cardiovascular:     Rate and Rhythm: Normal rate and regular rhythm.     Heart sounds: Normal heart sounds. No murmur heard.  No friction rub. No gallop.   Pulmonary:     Effort: Pulmonary effort is normal.     Breath sounds: Normal breath sounds. No wheezing or rales.  Musculoskeletal:     Cervical back: Neck supple.  Skin:    General: Skin is warm.     Findings: No rash.  Neurological:     Mental Status: She is alert and oriented to person, place, and time.  Psychiatric:        Behavior: Behavior normal.    Previous notes and tests were reviewed. The plan was reviewed with the patient/family, and all questions/concerned were addressed.  It was my pleasure to see Lisa Townsend today and participate in her care. Please feel free to contact me with any questions or concerns.  Sincerely,  Rexene Alberts, DO Allergy & Immunology  [facility] office: [phone] [facility] office: [place] office: [phone]

## 2020-07-16 DIAGNOSIS — N979 Female infertility, unspecified: Secondary | ICD-10-CM | POA: Diagnosis not present

## 2020-07-17 ENCOUNTER — Encounter: Payer: Federal, State, Local not specified - PPO | Admitting: Physical Therapy

## 2020-07-21 ENCOUNTER — Ambulatory Visit (INDEPENDENT_AMBULATORY_CARE_PROVIDER_SITE_OTHER): Payer: Federal, State, Local not specified - PPO

## 2020-07-21 ENCOUNTER — Other Ambulatory Visit: Payer: Self-pay

## 2020-07-21 DIAGNOSIS — J309 Allergic rhinitis, unspecified: Secondary | ICD-10-CM

## 2020-07-23 ENCOUNTER — Ambulatory Visit: Payer: Federal, State, Local not specified - PPO | Admitting: Allergy

## 2020-07-23 ENCOUNTER — Encounter: Payer: Self-pay | Admitting: Obstetrics & Gynecology

## 2020-07-23 ENCOUNTER — Ambulatory Visit (INDEPENDENT_AMBULATORY_CARE_PROVIDER_SITE_OTHER): Payer: Federal, State, Local not specified - PPO | Admitting: Obstetrics & Gynecology

## 2020-07-23 ENCOUNTER — Other Ambulatory Visit: Payer: Self-pay

## 2020-07-23 VITALS — BP 126/84

## 2020-07-23 DIAGNOSIS — R350 Frequency of micturition: Secondary | ICD-10-CM

## 2020-07-23 DIAGNOSIS — N926 Irregular menstruation, unspecified: Secondary | ICD-10-CM

## 2020-07-23 LAB — PREGNANCY, URINE: Preg Test, Ur: NEGATIVE

## 2020-07-23 MED ORDER — SULFAMETHOXAZOLE-TRIMETHOPRIM 800-160 MG PO TABS: 1.0000 | ORAL_TABLET | Freq: Two times a day (BID) | ORAL | 0 refills | Status: AC

## 2020-07-23 NOTE — Progress Notes (Signed)
    Lisa Townsend 05/19/1976 [id number]        44 y.o.  G0  RP: Urinary frequency   HPI: Urinary frequency with pelvic discomfort.  No blood seen in urine.  No pain with urination.  No fever.  Menstrual period late by 8 days currently.  Reversal of vasectomy in 2018, would like to conceive.  No abnormal vaginal discharge.  Bowel movements normal.   OB History  Gravida Para Term Preterm AB Living  0 0 0 0 0 0  SAB TAB Ectopic Multiple Live Births  0 0 0 0 0    Past medical history,surgical history, problem list, medications, allergies, family history and social history were all reviewed and documented in the EPIC chart.   Directed ROS with pertinent positives and negatives documented in the history of present illness/assessment and plan.  Exam:  Vitals:   07/23/20 1140  BP: 126/84   General appearance:  Normal  Abdomen: Normal with no distention, nontender.  Gynecologic exam: Vulva normal.  Bimanual exam:  Uterus AV, mobile, normal volume, NT.  No adnexal mass, NT bilaterally.  No blood, normal secretions.  Urine pregnancy test negative. U/A: Dark yellow, cloudy, protein negative, nitrites negative, white blood cells 0-5, red blood cell negative, bacteria few.  Urine culture pending.   Assessment/Plan:  44 y.o. G0P0000   1. Frequent urination Urine analysis mildly abnormal.  Will treat with Bactrim DS 1 tablet twice a day for 3 days.  Usage reviewed and prescription sent to pharmacy.  Urine culture pending. - Urinalysis,Complete w/RFL Culture  2. Late menses Late menses by a days.  Probably late ovulation at age 44, but will rule out pregnancy.  Urine pregnancy test was negative.  Serum pregnancy test pending.  Rule out thyroid dysfunction and hyperprolactinemia.  Patient requested an [facility] level. - hCG, serum, qualitative - TSH - Prolactin - FSH - Pregnancy, urine  Other orders - sulfamethoxazole-trimethoprim (BACTRIM DS) 800-160 MG tablet; Take 1 tablet by mouth  2 (two) times daily for 3 days.  Princess Bruins MD, 11:58 AM 07/23/2020

## 2020-07-24 ENCOUNTER — Encounter: Payer: Federal, State, Local not specified - PPO | Admitting: Physical Therapy

## 2020-07-24 LAB — HCG, SERUM, QUALITATIVE: Preg, Serum: NEGATIVE

## 2020-07-24 LAB — TSH: TSH: 1.64 mIU/L

## 2020-07-24 LAB — FOLLICLE STIMULATING HORMONE: FSH: 5 m[IU]/mL

## 2020-07-24 LAB — PROLACTIN: Prolactin: 14.7 ng/mL

## 2020-07-25 LAB — URINE CULTURE
MICRO NUMBER:: 10958939
Result:: NO GROWTH
SPECIMEN QUALITY:: ADEQUATE

## 2020-07-25 LAB — URINALYSIS, COMPLETE W/RFL CULTURE
Bilirubin Urine: NEGATIVE
Glucose, UA: NEGATIVE
Hgb urine dipstick: NEGATIVE
Hyaline Cast: NONE SEEN /LPF
Ketones, ur: NEGATIVE
Leukocyte Esterase: NEGATIVE
Nitrites, Initial: NEGATIVE
Protein, ur: NEGATIVE
RBC / HPF: NONE SEEN /HPF (ref 0–2)
Specific Gravity, Urine: 1.015 (ref 1.001–1.03)
pH: 7 (ref 5.0–8.0)

## 2020-07-25 LAB — CULTURE INDICATED

## 2020-07-27 ENCOUNTER — Other Ambulatory Visit: Payer: Self-pay | Admitting: Physician Assistant

## 2020-07-27 ENCOUNTER — Ambulatory Visit
Admission: RE | Admit: 2020-07-27 | Discharge: 2020-07-27 | Disposition: A | Discharge status: Home / Self Care | Payer: Federal, State, Local not specified - PPO | Source: Ambulatory Visit | Attending: Physician Assistant | Admitting: Physician Assistant

## 2020-07-27 ENCOUNTER — Telehealth: Payer: Self-pay | Admitting: Allergy

## 2020-07-27 ENCOUNTER — Other Ambulatory Visit: Payer: Self-pay

## 2020-07-27 DIAGNOSIS — R928 Other abnormal and inconclusive findings on diagnostic imaging of breast: Secondary | ICD-10-CM

## 2020-07-27 DIAGNOSIS — N6001 Solitary cyst of right breast: Secondary | ICD-10-CM | POA: Diagnosis not present

## 2020-07-27 DIAGNOSIS — R922 Inconclusive mammogram: Secondary | ICD-10-CM | POA: Diagnosis not present

## 2020-07-27 NOTE — Telephone Encounter (Signed)
Can you call patient and see why she made tomorrow's visit a televisit?  I was going to check her breathing with a breathing test.   I see she comes in for injections every week. Is she coming tomorrow for injections? If so, what time and can she do a regular visit at that time?  Thank you.

## 2020-07-27 NOTE — Telephone Encounter (Signed)
Called and spoke with patient, she has been rescheduled to come and see you next Thursday in [place] instead.

## 2020-07-28 ENCOUNTER — Ambulatory Visit: Payer: Federal, State, Local not specified - PPO | Admitting: Obstetrics & Gynecology

## 2020-07-28 ENCOUNTER — Ambulatory Visit (INDEPENDENT_AMBULATORY_CARE_PROVIDER_SITE_OTHER): Payer: Federal, State, Local not specified - PPO

## 2020-07-28 ENCOUNTER — Ambulatory Visit: Payer: Federal, State, Local not specified - PPO | Admitting: Allergy

## 2020-07-28 DIAGNOSIS — J309 Allergic rhinitis, unspecified: Secondary | ICD-10-CM

## 2020-07-30 ENCOUNTER — Encounter: Payer: Federal, State, Local not specified - PPO | Admitting: Physical Therapy

## 2020-07-31 ENCOUNTER — Encounter: Payer: Self-pay | Admitting: Obstetrics & Gynecology

## 2020-08-05 NOTE — Progress Notes (Signed)
Follow Up Note  RE: Lisa Townsend MRN: [id number] DOB: 1975/11/27 Date of Office Visit: 08/06/2020  Referring provider: Brunetta Jeans, PA-C Primary care provider: Brunetta Jeans, PA-C  Chief Complaint: Allergic Rhinitis  and Shortness of Breath  History of Present Illness: I had the pleasure of seeing Lisa Townsend for a follow up visit at the [facility] [place] on 08/06/2020. She is a 44 y.o. female, who is being followed for allergic rhinoconjunctivitis on AIT and shortness of breath. Her previous allergy office visit was on 04/09/2020 with Dr. Maudie Mercury. Today is a regular follow up visit.  Other allergic rhinitis Started allergy injections with some localized reactions. Currently on zyrtec 10mg  the day before and day of injection. Not using any nasal sprays or eye drops.  Shortness of breath Patient used Symbicort for 2 months and had issues with her last refill. Last inhaler use was about 1 month ago. She did notice improvement in her breathing while she was taking the inhaler however, her breathing the past month has not been as bad as before the last visit.  Denies any SOB, coughing, wheezing, chest tightness, nocturnal awakenings, ER/urgent care visits or prednisone use since the last visit.  Assessment and Plan: [name] is a 44 y.o. female with: Seasonal and perennial allergic rhinoconjunctivitis Past history - Perennial rhino conjunctivitis symptoms for the past few years but worsening each year. Spring and summer is the worst. Tried Zyrtec, Allegra, Flonase with minimal benefit. 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 + reflux. Does not tolerate Claritin. 2021 skin testing showed: Positive to grass, weed, ragweed, trees, mold. Started AIT on 04/30/2020 (G-RW-T & molds) Interim history - doing well and only using Zyrtec on the days before and day of injection. Tolerating AIT with some localized reactions.  Continue allergy injections.    Continue environmental control measures as below.  May use over the counter antihistamines such as Zyrtec (cetirizine), Allegra (fexofenadine), or Xyzal (levocetirizine) daily as needed.  May use Nasacort 1 spray per nostril twice a day for nasal symptoms.   Nasal saline spray (i.e., Simply Saline) or nasal saline lavage (i.e., NeilMed) is recommended as needed and prior to medicated nasal sprays.  May use olopatadine eye drops 0.2% once a day as needed for itchy/watery eyes.  Shortness of breath Past history - Daily shortness of breath for 3-4 months. No triggers noted. No prior diagnosis of asthma. Used albuterol during her COVID-19 infection with unknown benefit. No recent CXR. CBC diff, TSH normal in December 2020. 2021 spirometry showed: possible restrictive disease with 10% improvement in FEV1 post bronchodilator treatment. Clinically feeling some improvement. Interim history - Used Symbicort for 2 months with good benefit but had issues with last refill. Now off inhaler for the past 1 month and breathing is not as bad as before.  Today's spirometry was unchanged from last one but she has been off her inhaler for 1 month. We will restart Symbicort as she felt better on it. Daily controller medication(s): RESTART Symbicort 46mcg 2 puffs twice a day with spacer and rinse mouth afterwards. May use albuterol rescue inhaler 2 puffs every 4 to 6 hours as needed for shortness of breath, chest tightness, coughing, and wheezing. May use albuterol rescue inhaler 2 puffs 5 to 15 minutes prior to strenuous physical activities. Monitor frequency of use.  Repeat spirometry at next visit.   Return in about 3 months (around 11/05/2020).  Meds ordered this encounter  Medications  . budesonide-formoterol (  SYMBICORT) 80-4.5 MCG/ACT inhaler    Sig: Inhale 2 puffs into the lungs in the morning and at bedtime. with spacer and rinse mouth afterwards.    Dispense:  1 each    Refill:  5    Diagnostics: Spirometry:  Tracings reviewed. Her effort: Good reproducible efforts. FVC: 2.00L FEV1: 1.80L, 72% predicted FEV1/FVC ratio: 90% Interpretation: Spirometry consistent with possible restrictive disease.  Please see scanned spirometry results for details.  Medication List:  Current Outpatient Medications  Medication Sig Dispense Refill  . cetirizine (ZYRTEC) 10 MG tablet Take 10 mg by mouth daily.    . diazepam (VALIUM) 10 MG tablet TAKE 1 TABLET BY MOUTH EVERY 12 HOURS AS NEEDED FOR ANXIETY. 60 tablet 0  . EPINEPHrine 0.3 mg/0.3 mL IJ SOAJ injection Inject 0.3 mLs (0.3 mg total) into the muscle once as needed for up to 1 dose for anaphylaxis. 0.3 mL 2  . levothyroxine (SYNTHROID) 75 MCG tablet TAKE 1 TABLET BY MOUTH DAILY BEFORE BREAKFAST. 90 tablet 1  . nitrofurantoin (MACRODANTIN) 50 MG capsule Take as needed with intercourse 30 capsule 1  . Olopatadine HCl 0.2 % SOLN Apply 1 drop to eye daily as needed (itchy/watery eyes). 2.5 mL 5  . ondansetron (ZOFRAN-ODT) 8 MG disintegrating tablet For further refills, please schedule an appointment with provider. 20 tablet 0  . rizatriptan (MAXALT) 10 MG tablet TAKE 1 TABLET BY MOUTH AS NEEDED FOR MIGRAINE. MAY REPEAT IN 2 HOURS IF NEEDED 10 tablet 3  . budesonide-formoterol (SYMBICORT) 80-4.5 MCG/ACT inhaler Inhale 2 puffs into the lungs in the morning and at bedtime. with spacer and rinse mouth afterwards. 1 each 5  . diclofenac (VOLTAREN) 75 MG EC tablet Take 1 tablet (75 mg total) by mouth 2 (two) times daily. (Patient not taking: Reported on 08/06/2020) 60 tablet 0  . hydrOXYzine (ATARAX/VISTARIL) 50 MG tablet Take 1 tablet (50 mg total) by mouth at bedtime as needed (insomnia). (Patient not taking: Reported on 08/06/2020) 30 tablet 0  . pregabalin (LYRICA) 75 MG capsule Take 75 mg by mouth 2 (two) times daily. (Patient not taking: Reported on 08/06/2020)    . triamcinolone (NASACORT) 55 MCG/ACT AERO nasal inhaler Place 1 spray into  the nose 2 (two) times daily. For nasal symptoms. (Patient not taking: Reported on 08/06/2020) 1 Inhaler 5   No current facility-administered medications for this visit.   Allergies: Allergies  Allergen Reactions  . Claritin [Loratadine]     shakiness  . Lexapro [Escitalopram] Nausea Only   I reviewed her past medical history, social history, family history, and environmental history and no significant changes have been reported from her previous visit.  Review of Systems  Constitutional: Negative for appetite change, chills, fever and unexpected weight change.  HENT: Negative for congestion, postnasal drip, rhinorrhea and sneezing.   Eyes: Negative for itching.  Respiratory: Negative for cough, chest tightness, shortness of breath and wheezing.   Cardiovascular: Negative for chest pain.  Gastrointestinal: Positive for constipation. Negative for abdominal pain.  Genitourinary: Negative for difficulty urinating.  Skin: Negative for rash.  Allergic/Immunologic: Positive for environmental allergies. Negative for food allergies.  Neurological: Positive for headaches.   Objective: BP 110/60   Pulse 68   Resp 17   SpO2 99%  There is no height or weight on file to calculate BMI. Physical Exam Vitals and nursing note reviewed.  Constitutional:      Appearance: Normal appearance. She is well-developed.  HENT:     Head: Normocephalic and atraumatic.  Right Ear: External ear normal.     Left Ear: External ear normal.     Nose: Nose normal.     Mouth/Throat:     Mouth: Mucous membranes are moist.     Pharynx: Oropharynx is clear.  Eyes:     Conjunctiva/sclera: Conjunctivae normal.  Cardiovascular:     Rate and Rhythm: Normal rate and regular rhythm.     Heart sounds: Normal heart sounds. No murmur heard.  No friction rub. No gallop.   Pulmonary:     Effort: Pulmonary effort is normal.     Breath sounds: Normal breath sounds. No wheezing or rales.  Musculoskeletal:      Cervical back: Neck supple.  Skin:    General: Skin is warm.     Findings: No rash.  Neurological:     Mental Status: She is alert and oriented to person, place, and time.  Psychiatric:        Behavior: Behavior normal.    Previous notes and tests were reviewed. The plan was reviewed with the patient/family, and all questions/concerned were addressed.  It was my pleasure to see Lisa Townsend today and participate in her care. Please feel free to contact me with any questions or concerns.  Sincerely,  Rexene Alberts, DO Allergy & Immunology  [facility] office: [place] office: [phone]

## 2020-08-06 ENCOUNTER — Ambulatory Visit (INDEPENDENT_AMBULATORY_CARE_PROVIDER_SITE_OTHER): Payer: Federal, State, Local not specified - PPO | Admitting: Allergy

## 2020-08-06 ENCOUNTER — Other Ambulatory Visit: Payer: Self-pay

## 2020-08-06 ENCOUNTER — Encounter: Payer: Self-pay | Admitting: Allergy

## 2020-08-06 VITALS — BP 110/60 | HR 68 | Resp 17

## 2020-08-06 DIAGNOSIS — J302 Other seasonal allergic rhinitis: Secondary | ICD-10-CM | POA: Diagnosis not present

## 2020-08-06 DIAGNOSIS — J3089 Other allergic rhinitis: Secondary | ICD-10-CM | POA: Diagnosis not present

## 2020-08-06 DIAGNOSIS — H1010 Acute atopic conjunctivitis, unspecified eye: Secondary | ICD-10-CM | POA: Diagnosis not present

## 2020-08-06 DIAGNOSIS — R0602 Shortness of breath: Secondary | ICD-10-CM | POA: Diagnosis not present

## 2020-08-06 DIAGNOSIS — J309 Allergic rhinitis, unspecified: Secondary | ICD-10-CM | POA: Diagnosis not present

## 2020-08-06 MED ORDER — BUDESONIDE-FORMOTEROL FUMARATE 80-4.5 MCG/ACT IN AERO
2.0000 | INHALATION_SPRAY | Freq: Two times a day (BID) | RESPIRATORY_TRACT | 5 refills | Status: DC
Start: 2020-08-06 — End: 2022-05-08

## 2020-08-06 NOTE — Assessment & Plan Note (Signed)
Past history - Daily shortness of breath for 3-4 months. No triggers noted. No prior diagnosis of asthma. Used albuterol during her COVID-19 infection with unknown benefit. No recent CXR. CBC diff, TSH normal in December 2020. 2021 spirometry showed: possible restrictive disease with 10% improvement in FEV1 post bronchodilator treatment. Clinically feeling some improvement. Interim history - Used Symbicort for 2 months with good benefit but had issues with last refill. Now off inhaler for the past 1 month and breathing is not as bad as before.  Today's spirometry was unchanged from last one but she has been off her inhaler for 1 month. We will restart Symbicort as she felt better on it. Daily controller medication(s): RESTART Symbicort 70mcg 2 puffs twice a day with spacer and rinse mouth afterwards. May use albuterol rescue inhaler 2 puffs every 4 to 6 hours as needed for shortness of breath, chest tightness, coughing, and wheezing. May use albuterol rescue inhaler 2 puffs 5 to 15 minutes prior to strenuous physical activities. Monitor frequency of use.  Repeat spirometry at next visit.

## 2020-08-06 NOTE — Patient Instructions (Addendum)
Environmental allergies  Past skin testing showed: Positive to grass, weed, ragweed, trees, mold.   Continue allergy injections.   Continue environmental control measures as below.  May use over the counter antihistamines such as Zyrtec (cetirizine), Allegra (fexofenadine), or Xyzal (levocetirizine) daily as needed.  May use Nasacort 1 spray per nostril twice a day for nasal symptoms.   Nasal saline spray (i.e., Simply Saline) or nasal saline lavage (i.e., NeilMed) is recommended as needed and prior to medicated nasal sprays.  May use olopatadine eye drops 0.2% once a day as needed for itchy/watery eyes.  Shortness of breath: Daily controller medication(s): RESTART Symbicort 26mcg 2 puffs twice a day with spacer and rinse mouth afterwards. May use albuterol rescue inhaler 2 puffs every 4 to 6 hours as needed for shortness of breath, chest tightness, coughing, and wheezing. May use albuterol rescue inhaler 2 puffs 5 to 15 minutes prior to strenuous physical activities. Monitor frequency of use.  Asthma control goals:  Full participation in all desired activities (may need albuterol before activity) Albuterol use two times or less a week on average (not counting use with activity) Cough interfering with sleep two times or less a month Oral steroids no more than once a year No hospitalizations  Follow up in 2-3 months or sooner if needed to check your breathing.  Reducing Pollen Exposure . Pollen seasons: trees (spring), grass (summer) and ragweed/weeds (fall). Marland Kitchen Keep windows closed in your home and car to lower pollen exposure.  Susa Simmonds air conditioning in the bedroom and throughout the house if possible.  . Avoid going out in dry windy days - especially early morning. . Pollen counts are highest between 5 - 10 AM and on dry, hot and windy days.  . Save outside activities for late afternoon or after a heavy rain, when pollen levels are lower.  . Avoid mowing of grass if you have  grass pollen allergy. Marland Kitchen Be aware that pollen can also be transported indoors on people and pets.  . Dry your clothes in an automatic dryer rather than hanging them outside where they might collect pollen.  . Rinse hair and eyes before bedtime.  Mold Control . Mold and fungi can grow on a variety of surfaces provided certain temperature and moisture conditions exist.  . Outdoor molds grow on plants, decaying vegetation and soil. The major outdoor mold, Alternaria and Cladosporium, are found in very high numbers during hot and dry conditions. Generally, a late summer - fall peak is seen for common outdoor fungal spores. Rain will temporarily lower outdoor mold spore count, but counts rise rapidly when the rainy period ends. . The most important indoor molds are Aspergillus and Penicillium. Dark, humid and poorly ventilated basements are ideal sites for mold growth. The next most common sites of mold growth are the bathroom and the kitchen. Outdoor (Seasonal) Mold Control . Use air conditioning and keep windows closed. . Avoid exposure to decaying vegetation. Marland Kitchen Avoid leaf raking. . Avoid grain handling. . Consider wearing a face mask if working in moldy areas.  Indoor (Perennial) Mold Control  . Maintain humidity below 50%. . Get rid of mold growth on hard surfaces with water, detergent and, if necessary, 5% bleach (do not mix with other cleaners). Then dry the area completely. If mold covers an area more than 10 square feet, consider hiring an indoor environmental professional. . For clothing, washing with soap and water is best. If moldy items cannot be cleaned and dried, throw them  away. . Remove sources e.g. contaminated carpets. . Repair and seal leaking roofs or pipes. Using dehumidifiers in damp basements may be helpful, but empty the water and clean units regularly to prevent mildew from forming. All rooms, especially basements, bathrooms and kitchens, require ventilation and cleaning to  deter mold and mildew growth. Avoid carpeting on concrete or damp floors, and storing items in damp areas.

## 2020-08-06 NOTE — Assessment & Plan Note (Addendum)
Past history - Perennial rhino conjunctivitis symptoms for the past few years but worsening each year. Spring and summer is the worst. Tried Zyrtec, Allegra, Flonase with minimal benefit. 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 + reflux. Does not tolerate Claritin. 2021 skin testing showed: Positive to grass, weed, ragweed, trees, mold. Started AIT on 04/30/2020 (G-RW-T & molds) Interim history - doing well and only using Zyrtec on the days before and day of injection. Tolerating AIT with some localized reactions.  Continue allergy injections.   Continue environmental control measures as below.  May use over the counter antihistamines such as Zyrtec (cetirizine), Allegra (fexofenadine), or Xyzal (levocetirizine) daily as needed.  May use Nasacort 1 spray per nostril twice a day for nasal symptoms.   Nasal saline spray (i.e., Simply Saline) or nasal saline lavage (i.e., NeilMed) is recommended as needed and prior to medicated nasal sprays.  May use olopatadine eye drops 0.2% once a day as needed for itchy/watery eyes.

## 2020-08-11 ENCOUNTER — Ambulatory Visit (INDEPENDENT_AMBULATORY_CARE_PROVIDER_SITE_OTHER): Payer: Federal, State, Local not specified - PPO

## 2020-08-11 ENCOUNTER — Other Ambulatory Visit: Payer: Self-pay

## 2020-08-11 DIAGNOSIS — J309 Allergic rhinitis, unspecified: Secondary | ICD-10-CM

## 2020-08-20 ENCOUNTER — Other Ambulatory Visit: Payer: Self-pay

## 2020-08-20 ENCOUNTER — Ambulatory Visit (INDEPENDENT_AMBULATORY_CARE_PROVIDER_SITE_OTHER): Payer: Federal, State, Local not specified - PPO

## 2020-08-20 DIAGNOSIS — J309 Allergic rhinitis, unspecified: Secondary | ICD-10-CM | POA: Diagnosis not present

## 2020-08-21 DIAGNOSIS — E289 Ovarian dysfunction, unspecified: Secondary | ICD-10-CM | POA: Diagnosis not present

## 2020-08-23 ENCOUNTER — Other Ambulatory Visit: Payer: Self-pay | Admitting: Physician Assistant

## 2020-08-25 ENCOUNTER — Ambulatory Visit (INDEPENDENT_AMBULATORY_CARE_PROVIDER_SITE_OTHER): Payer: Federal, State, Local not specified - PPO

## 2020-08-25 ENCOUNTER — Other Ambulatory Visit: Payer: Self-pay

## 2020-08-25 DIAGNOSIS — J309 Allergic rhinitis, unspecified: Secondary | ICD-10-CM | POA: Diagnosis not present

## 2020-08-31 DIAGNOSIS — N979 Female infertility, unspecified: Secondary | ICD-10-CM | POA: Diagnosis not present

## 2020-09-01 ENCOUNTER — Other Ambulatory Visit: Payer: Self-pay

## 2020-09-01 ENCOUNTER — Ambulatory Visit (INDEPENDENT_AMBULATORY_CARE_PROVIDER_SITE_OTHER): Payer: Federal, State, Local not specified - PPO

## 2020-09-01 DIAGNOSIS — J309 Allergic rhinitis, unspecified: Secondary | ICD-10-CM

## 2020-09-10 ENCOUNTER — Ambulatory Visit (INDEPENDENT_AMBULATORY_CARE_PROVIDER_SITE_OTHER): Payer: Federal, State, Local not specified - PPO

## 2020-09-10 ENCOUNTER — Other Ambulatory Visit: Payer: Self-pay

## 2020-09-10 DIAGNOSIS — J309 Allergic rhinitis, unspecified: Secondary | ICD-10-CM | POA: Diagnosis not present

## 2020-09-14 DIAGNOSIS — E289 Ovarian dysfunction, unspecified: Secondary | ICD-10-CM | POA: Diagnosis not present

## 2020-09-15 ENCOUNTER — Ambulatory Visit (INDEPENDENT_AMBULATORY_CARE_PROVIDER_SITE_OTHER): Payer: Federal, State, Local not specified - PPO

## 2020-09-15 ENCOUNTER — Other Ambulatory Visit: Payer: Self-pay

## 2020-09-15 DIAGNOSIS — J309 Allergic rhinitis, unspecified: Secondary | ICD-10-CM

## 2020-09-22 ENCOUNTER — Other Ambulatory Visit: Payer: Self-pay

## 2020-09-22 ENCOUNTER — Ambulatory Visit (INDEPENDENT_AMBULATORY_CARE_PROVIDER_SITE_OTHER): Payer: Federal, State, Local not specified - PPO

## 2020-09-22 DIAGNOSIS — J309 Allergic rhinitis, unspecified: Secondary | ICD-10-CM | POA: Diagnosis not present

## 2020-09-29 ENCOUNTER — Ambulatory Visit (INDEPENDENT_AMBULATORY_CARE_PROVIDER_SITE_OTHER): Payer: Federal, State, Local not specified - PPO

## 2020-09-29 ENCOUNTER — Other Ambulatory Visit: Payer: Self-pay

## 2020-09-29 DIAGNOSIS — J309 Allergic rhinitis, unspecified: Secondary | ICD-10-CM | POA: Diagnosis not present

## 2020-10-08 ENCOUNTER — Other Ambulatory Visit: Payer: Self-pay

## 2020-10-08 ENCOUNTER — Ambulatory Visit (INDEPENDENT_AMBULATORY_CARE_PROVIDER_SITE_OTHER): Payer: Federal, State, Local not specified - PPO

## 2020-10-08 DIAGNOSIS — J309 Allergic rhinitis, unspecified: Secondary | ICD-10-CM

## 2020-10-15 ENCOUNTER — Other Ambulatory Visit: Payer: Self-pay | Admitting: Physician Assistant

## 2020-10-15 ENCOUNTER — Other Ambulatory Visit: Payer: Self-pay

## 2020-10-15 ENCOUNTER — Encounter: Payer: Self-pay | Admitting: Emergency Medicine

## 2020-10-15 ENCOUNTER — Ambulatory Visit (INDEPENDENT_AMBULATORY_CARE_PROVIDER_SITE_OTHER): Payer: Federal, State, Local not specified - PPO

## 2020-10-15 DIAGNOSIS — J309 Allergic rhinitis, unspecified: Secondary | ICD-10-CM

## 2020-10-15 NOTE — Telephone Encounter (Signed)
Diazepam last rx 08/24/20 #60  LOV: 02/21/20 Back pain  My chart message sent to patient to schedule an appointment for Anxiety

## 2020-10-19 DIAGNOSIS — Z114 Encounter for screening for human immunodeficiency virus [HIV]: Secondary | ICD-10-CM | POA: Diagnosis not present

## 2020-10-19 DIAGNOSIS — Z1159 Encounter for screening for other viral diseases: Secondary | ICD-10-CM | POA: Diagnosis not present

## 2020-10-19 DIAGNOSIS — Z01812 Encounter for preprocedural laboratory examination: Secondary | ICD-10-CM | POA: Diagnosis not present

## 2020-10-19 DIAGNOSIS — Z113 Encounter for screening for infections with a predominantly sexual mode of transmission: Secondary | ICD-10-CM | POA: Diagnosis not present

## 2020-10-20 ENCOUNTER — Other Ambulatory Visit: Payer: Self-pay

## 2020-10-20 ENCOUNTER — Ambulatory Visit (INDEPENDENT_AMBULATORY_CARE_PROVIDER_SITE_OTHER): Payer: Federal, State, Local not specified - PPO

## 2020-10-20 DIAGNOSIS — J309 Allergic rhinitis, unspecified: Secondary | ICD-10-CM | POA: Diagnosis not present

## 2020-10-26 ENCOUNTER — Other Ambulatory Visit: Payer: Self-pay | Admitting: Physician Assistant

## 2020-10-26 MED ORDER — LEVOTHYROXINE SODIUM 75 MCG PO TABS: ORAL_TABLET | ORAL | 1 refills | Status: DC

## 2020-10-27 ENCOUNTER — Other Ambulatory Visit: Payer: Self-pay

## 2020-10-27 ENCOUNTER — Ambulatory Visit (INDEPENDENT_AMBULATORY_CARE_PROVIDER_SITE_OTHER): Payer: Federal, State, Local not specified - PPO

## 2020-10-27 DIAGNOSIS — J309 Allergic rhinitis, unspecified: Secondary | ICD-10-CM | POA: Diagnosis not present

## 2020-10-28 ENCOUNTER — Other Ambulatory Visit: Payer: Self-pay

## 2020-10-28 ENCOUNTER — Encounter: Payer: Self-pay | Admitting: Physician Assistant

## 2020-10-28 DIAGNOSIS — R109 Unspecified abdominal pain: Secondary | ICD-10-CM

## 2020-10-28 DIAGNOSIS — R140 Abdominal distension (gaseous): Secondary | ICD-10-CM

## 2020-10-28 NOTE — Telephone Encounter (Signed)
Okay to place referral to gastroenterology

## 2020-11-02 ENCOUNTER — Encounter: Payer: Federal, State, Local not specified - PPO | Admitting: Physician Assistant

## 2020-11-04 NOTE — Progress Notes (Deleted)
Follow Up Note  RE: Lisa Townsend MRN: [id number] DOB: 02-10-1976 Date of Office Visit: 11/05/2020  Referring provider: Waldon Merl, PA-C Primary care provider: Waldon Merl, PA-C  Chief Complaint: No chief complaint on file.  History of Present Illness: I had the pleasure of seeing Lisa Townsend for a follow up visit at the [facility] on 11/04/2020. She is a 44 y.o. female, who is being followed for allergic rhinoconjunctivitis on AIT and shortness of breath. Her previous allergy office visit was on 08/06/2020 with Dr. Selena Townsend. Today is a regular follow up visit.  Seasonal and perennial allergic rhinoconjunctivitis Past history - Perennial rhino conjunctivitis symptoms for the past few years but worsening each year. Spring and summer is the worst. Tried Zyrtec, Allegra, Flonase with minimal benefit. 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 + reflux. Does not tolerate Claritin. 2021 skin testing showed: Positive to grass, weed, ragweed, trees, mold. Started AIT on 04/30/2020 (G-RW-T & molds) Interim history - doing well and only using Zyrtec on the days before and day of injection. Tolerating AIT with some localized reactions.  Continue allergy injections.   Continue environmental control measures as below.  May use over the counter antihistamines such as Zyrtec (cetirizine), Allegra (fexofenadine), or Xyzal (levocetirizine) daily as needed.  May use Nasacort 1 spray per nostril twice a day for nasal symptoms.   Nasal saline spray (i.e., Simply Saline) or nasal saline lavage (i.e., NeilMed) is recommended as needed and prior to medicated nasal sprays.  May use olopatadine eye drops 0.2% once a day as needed for itchy/watery eyes.  Shortness of breath Past history - Daily shortness of breath for 3-4 months. No triggers noted. No prior diagnosis of asthma. Used albuterol during her COVID-19 infection with unknown benefit. No recent CXR. CBC  diff, TSH normal in December 2020. 2021 spirometry showed: possible restrictive disease with 10% improvement in FEV1 post bronchodilator treatment. Clinically feeling some improvement. Interim history - Used Symbicort for 2 months with good benefit but had issues with last refill. Now off inhaler for the past 1 month and breathing is not as bad as before.  Today's spirometry was unchanged from last one but she has been off her inhaler for 1 month.  We will restart Symbicort as she felt better on it.  Daily controller medication(s):RESTART Symbicort 2 puffs twice a day with spacer and rinse mouth afterwards.  May use albuterol rescue inhaler 2 puffs every 4 to 6 hours as needed for shortness of breath, chest tightness, coughing, and wheezing. May use albuterol rescue inhaler 2 puffs 5 to 15 minutes prior to strenuous physical activities. Monitor frequency of use.   Repeat spirometry at next visit.   Return in about 3 months (around 11/05/2020).   Assessment and Plan: Lisa Townsend is a 44 y.o. female with: No problem-specific Assessment & Plan notes found for this encounter.  No follow-ups on file.  No orders of the defined types were placed in this encounter.  Lab Orders  No laboratory test(s) ordered today    Diagnostics: Spirometry:  Tracings reviewed. Her effort: {Blank single:19197::"Good reproducible efforts.","It was hard to get consistent efforts and there is a question as to whether this reflects a maximal maneuver.","Poor effort, data can not be interpreted."} FVC: ***L FEV1: ***L, ***% predicted FEV1/FVC ratio: ***% Interpretation: {Blank single:19197::"Spirometry consistent with mild obstructive disease","Spirometry consistent with moderate obstructive disease","Spirometry consistent with severe obstructive disease","Spirometry consistent with possible restrictive disease","Spirometry consistent with mixed obstructive  and restrictive disease","Spirometry  uninterpretable due to technique","Spirometry consistent with normal pattern","No overt abnormalities noted given today's efforts"}.  Please see scanned spirometry results for details.  Skin Testing: {Blank single:19197::"Select foods","Environmental allergy panel","Environmental allergy panel and select foods","Food allergy panel","None","Deferred due to recent antihistamines use"}. Positive test to: ***. Negative test to: ***.  Results discussed with patient/family.   Medication List:  Current Outpatient Medications  Medication Sig Dispense Refill  . budesonide-formoterol (SYMBICORT) 80-4.5 MCG/ACT inhaler Inhale 2 puffs into the lungs in the morning and at bedtime. with spacer and rinse mouth afterwards. 1 each 5  . cetirizine (ZYRTEC) 10 MG tablet Take 10 mg by mouth daily.    . diazepam (VALIUM) 10 MG tablet TAKE 1 TABLET BY MOUTH EVERY 12 HOURS AS NEEDED FOR ANXIETY 60 tablet 0  . diclofenac (VOLTAREN) 75 MG EC tablet Take 1 tablet (75 mg total) by mouth 2 (two) times daily. (Patient not taking: Reported on 08/06/2020) 60 tablet 0  . EPINEPHrine 0.3 mg/0.3 mL IJ SOAJ injection Inject 0.3 mLs (0.3 mg total) into the muscle once as needed for up to 1 dose for anaphylaxis. 0.3 mL 2  . hydrOXYzine (ATARAX/VISTARIL) 50 MG tablet Take 1 tablet (50 mg total) by mouth at bedtime as needed (insomnia). (Patient not taking: Reported on 08/06/2020) 30 tablet 0  . levothyroxine (SYNTHROID) 75 MCG tablet TAKE 1 TABLET BY MOUTH DAILY BEFORE BREAKFAST. 90 tablet 1  . nitrofurantoin (MACRODANTIN) 50 MG capsule Take as needed with intercourse 30 capsule 1  . Olopatadine HCl 0.2 % SOLN Apply 1 drop to eye daily as needed (itchy/watery eyes). 2.5 mL 5  . ondansetron (ZOFRAN-ODT) 8 MG disintegrating tablet For further refills, please schedule an appointment with provider. 20 tablet 0  . pregabalin (LYRICA) 75 MG capsule Take 75 mg by mouth 2 (two) times daily. (Patient not taking: Reported on 08/06/2020)    .  rizatriptan (MAXALT) 10 MG tablet TAKE 1 TABLET BY MOUTH AS NEEDED FOR MIGRAINE. MAY REPEAT IN 2 HOURS IF NEEDED 10 tablet 3  . triamcinolone (NASACORT) 55 MCG/ACT AERO nasal inhaler Place 1 spray into the nose 2 (two) times daily. For nasal symptoms. (Patient not taking: Reported on 08/06/2020) 1 Inhaler 5   No current facility-administered medications for this visit.   Allergies: Allergies  Allergen Reactions  . Claritin [Loratadine]     shakiness  . Lexapro [Escitalopram] Nausea Only   I reviewed her past medical history, social history, family history, and environmental history and no significant changes have been reported from her previous visit.  Review of Systems  Constitutional: Negative for appetite change, chills, fever and unexpected weight change.  HENT: Negative for congestion, postnasal drip, rhinorrhea and sneezing.   Eyes: Negative for itching.  Respiratory: Negative for cough, chest tightness, shortness of breath and wheezing.   Cardiovascular: Negative for chest pain.  Gastrointestinal: Positive for constipation. Negative for abdominal pain.  Genitourinary: Negative for difficulty urinating.  Skin: Negative for rash.  Allergic/Immunologic: Positive for environmental allergies. Negative for food allergies.  Neurological: Positive for headaches.   Objective: There were no vitals taken for this visit. There is no height or weight on file to calculate BMI. Physical Exam Vitals and nursing note reviewed.  Constitutional:      Appearance: Normal appearance. She is well-developed.  HENT:     Head: Normocephalic and atraumatic.     Right Ear: External ear normal.     Left Ear: External ear normal.     Nose: Nose  normal.     Mouth/Throat:     Mouth: Mucous membranes are moist.     Pharynx: Oropharynx is clear.  Eyes:     Conjunctiva/sclera: Conjunctivae normal.  Cardiovascular:     Rate and Rhythm: Normal rate and regular rhythm.     Heart sounds: Normal heart  sounds. No murmur heard. No friction rub. No gallop.   Pulmonary:     Effort: Pulmonary effort is normal.     Breath sounds: Normal breath sounds. No wheezing or rales.  Musculoskeletal:     Cervical back: Neck supple.  Skin:    General: Skin is warm.     Findings: No rash.  Neurological:     Mental Status: She is alert and oriented to person, place, and time.  Psychiatric:        Behavior: Behavior normal.    Previous notes and tests were reviewed. The plan was reviewed with the patient/family, and all questions/concerned were addressed.  It was my pleasure to see Lisa Townsend today and participate in her care. Please feel free to contact me with any questions or concerns.  Sincerely,  Rexene Alberts, DO Allergy & Immunology  [facility] office: [place] office: [phone]

## 2020-11-05 ENCOUNTER — Ambulatory Visit (INDEPENDENT_AMBULATORY_CARE_PROVIDER_SITE_OTHER): Payer: Federal, State, Local not specified - PPO

## 2020-11-05 ENCOUNTER — Other Ambulatory Visit: Payer: Self-pay

## 2020-11-05 ENCOUNTER — Ambulatory Visit: Payer: Federal, State, Local not specified - PPO | Admitting: Allergy

## 2020-11-05 ENCOUNTER — Ambulatory Visit: Payer: Federal, State, Local not specified - PPO

## 2020-11-05 DIAGNOSIS — J309 Allergic rhinitis, unspecified: Secondary | ICD-10-CM

## 2020-11-11 ENCOUNTER — Other Ambulatory Visit: Payer: Self-pay | Admitting: Physician Assistant

## 2020-11-11 MED ORDER — RIZATRIPTAN BENZOATE 10 MG PO TABS
ORAL_TABLET | ORAL | 3 refills | Status: DC
Start: 2020-11-11 — End: 2022-01-10

## 2020-11-17 ENCOUNTER — Other Ambulatory Visit: Payer: Self-pay

## 2020-11-17 ENCOUNTER — Ambulatory Visit (INDEPENDENT_AMBULATORY_CARE_PROVIDER_SITE_OTHER): Payer: Federal, State, Local not specified - PPO

## 2020-11-17 DIAGNOSIS — J309 Allergic rhinitis, unspecified: Secondary | ICD-10-CM

## 2020-11-23 ENCOUNTER — Encounter: Payer: Federal, State, Local not specified - PPO | Admitting: Physician Assistant

## 2020-11-26 ENCOUNTER — Other Ambulatory Visit: Payer: Self-pay

## 2020-11-26 ENCOUNTER — Ambulatory Visit (INDEPENDENT_AMBULATORY_CARE_PROVIDER_SITE_OTHER): Payer: Federal, State, Local not specified - PPO

## 2020-11-26 DIAGNOSIS — J309 Allergic rhinitis, unspecified: Secondary | ICD-10-CM | POA: Diagnosis not present

## 2020-11-30 ENCOUNTER — Encounter: Payer: Federal, State, Local not specified - PPO | Admitting: Physician Assistant

## 2020-11-30 ENCOUNTER — Ambulatory Visit (INDEPENDENT_AMBULATORY_CARE_PROVIDER_SITE_OTHER): Payer: Federal, State, Local not specified - PPO | Admitting: Physician Assistant

## 2020-11-30 ENCOUNTER — Encounter: Payer: Self-pay | Admitting: Physician Assistant

## 2020-11-30 ENCOUNTER — Other Ambulatory Visit: Payer: Self-pay

## 2020-11-30 VITALS — BP 100/70 | HR 81 | Temp 98.8°F | Resp 14 | Ht 60.0 in | Wt 144.0 lb

## 2020-11-30 DIAGNOSIS — F411 Generalized anxiety disorder: Secondary | ICD-10-CM

## 2020-11-30 DIAGNOSIS — Z23 Encounter for immunization: Secondary | ICD-10-CM | POA: Diagnosis not present

## 2020-11-30 DIAGNOSIS — E039 Hypothyroidism, unspecified: Secondary | ICD-10-CM | POA: Diagnosis not present

## 2020-11-30 DIAGNOSIS — Z0000 Encounter for general adult medical examination without abnormal findings: Secondary | ICD-10-CM | POA: Diagnosis not present

## 2020-11-30 DIAGNOSIS — Z8639 Personal history of other endocrine, nutritional and metabolic disease: Secondary | ICD-10-CM

## 2020-11-30 LAB — CBC WITH DIFFERENTIAL/PLATELET
Basophils Absolute: 0 10*3/uL (ref 0.0–0.1)
Basophils Relative: 0.6 % (ref 0.0–3.0)
Eosinophils Absolute: 0.1 10*3/uL (ref 0.0–0.7)
Eosinophils Relative: 1.8 % (ref 0.0–5.0)
HCT: 38.8 % (ref 36.0–46.0)
Hemoglobin: 13.1 g/dL (ref 12.0–15.0)
Lymphocytes Relative: 22.1 % (ref 12.0–46.0)
Lymphs Abs: 1.3 10*3/uL (ref 0.7–4.0)
MCHC: 33.7 g/dL (ref 30.0–36.0)
MCV: 94.6 fl (ref 78.0–100.0)
Monocytes Absolute: 0.3 10*3/uL (ref 0.1–1.0)
Monocytes Relative: 5.1 % (ref 3.0–12.0)
Neutro Abs: 4.3 10*3/uL (ref 1.4–7.7)
Neutrophils Relative %: 70.4 % (ref 43.0–77.0)
Platelets: 269 10*3/uL (ref 150.0–400.0)
RBC: 4.1 Mil/uL (ref 3.87–5.11)
RDW: 12.8 % (ref 11.5–15.5)
WBC: 6.1 10*3/uL (ref 4.0–10.5)

## 2020-11-30 LAB — COMPREHENSIVE METABOLIC PANEL
ALT: 17 U/L (ref 0–35)
AST: 17 U/L (ref 0–37)
Albumin: 4.6 g/dL (ref 3.5–5.2)
Alkaline Phosphatase: 53 U/L (ref 39–117)
BUN: 11 mg/dL (ref 6–23)
CO2: 26 mEq/L (ref 19–32)
Calcium: 9.6 mg/dL (ref 8.4–10.5)
Chloride: 103 mEq/L (ref 96–112)
Creatinine, Ser: 0.78 mg/dL (ref 0.40–1.20)
GFR: 91.74 mL/min (ref >60.00)
Glucose, Bld: 80 mg/dL (ref 70–99)
Potassium: 4.4 mEq/L (ref 3.5–5.1)
Sodium: 136 mEq/L (ref 135–145)
Total Bilirubin: 0.8 mg/dL (ref 0.2–1.2)
Total Protein: 7.1 g/dL (ref 6.0–8.3)

## 2020-11-30 LAB — VITAMIN B12: Vitamin B-12: 440 pg/mL (ref 211–911)

## 2020-11-30 LAB — LIPID PANEL
Cholesterol: 199 mg/dL (ref 0–200)
HDL: 80.6 mg/dL (ref >39.00)
LDL Cholesterol: 94 mg/dL (ref 0–99)
NonHDL: 118.58
Total CHOL/HDL Ratio: 2
Triglycerides: 121 mg/dL (ref 0.0–149.0)
VLDL: 24.2 mg/dL (ref 0.0–40.0)

## 2020-11-30 LAB — VITAMIN D 25 HYDROXY (VIT D DEFICIENCY, FRACTURES): VITD: 33.18 ng/mL (ref 30.00–100.00)

## 2020-11-30 LAB — HEMOGLOBIN A1C: Hgb A1c MFr Bld: 5.2 % (ref 4.6–6.5)

## 2020-11-30 LAB — TSH: TSH: 4.28 u[IU]/mL (ref 0.35–4.50)

## 2020-11-30 MED ORDER — DIAZEPAM 10 MG PO TABS: 10.0000 mg | ORAL_TABLET | Freq: Two times a day (BID) | ORAL | 0 refills | Status: DC | PRN

## 2020-11-30 NOTE — Patient Instructions (Signed)
Please go to the lab for blood work.   Our office will call you with your results unless you have chosen to receive results via MyChart.  If your blood work is normal we will follow-up each year for physicals and as scheduled for chronic medical problems.  If anything is abnormal we will treat accordingly and get you in for a follow-up.   Preventive Care 32-46 Years Old, Female Preventive care refers to lifestyle choices and visits with your health care provider that can promote health and wellness. This includes:  A yearly physical exam. This is also called an annual wellness visit.  Regular dental and eye exams.  Immunizations.  Screening for certain conditions.  Healthy lifestyle choices, such as: ? Eating a healthy diet. ? Getting regular exercise. ? Not using drugs or products that contain nicotine and tobacco. ? Limiting alcohol use. What can I expect for my preventive care visit? Physical exam Your health care provider will check your:  Height and weight. These may be used to calculate your BMI (body mass index). BMI is a measurement that tells if you are at a healthy weight.  Heart rate and blood pressure.  Body temperature.  Skin for abnormal spots. Counseling Your health care provider may ask you questions about your:  Past medical problems.  Family's medical history.  Alcohol, tobacco, and drug use.  Emotional well-being.  Home life and relationship well-being.  Sexual activity.  Diet, exercise, and sleep habits.  Work and work Statistician.  Access to firearms.  Method of birth control.  Menstrual cycle.  Pregnancy history. What immunizations do I need? Vaccines are usually given at various ages, according to a schedule. Your health care provider will recommend vaccines for you based on your age, medical history, and lifestyle or other factors, such as travel or where you work.   What tests do I need? Blood tests  Lipid and cholesterol  levels. These may be checked every 5 years, or more often if you are over 23 years old.  Hepatitis C test.  Hepatitis B test. Screening  Lung cancer screening. You may have this screening every year starting at age 11 if you have a 30-pack-year history of smoking and currently smoke or have quit within the past 15 years.  Colorectal cancer screening. ? All adults should have this screening starting at age 34 and continuing until age 49. ? Your health care provider may recommend screening at age 33 if you are at increased risk. ? You will have tests every 1-10 years, depending on your results and the type of screening test.  Diabetes screening. ? This is done by checking your blood sugar (glucose) after you have not eaten for a while (fasting). ? You may have this done every 1-3 years.  Mammogram. ? This may be done every 1-2 years. ? Talk with your health care provider about when you should start having regular mammograms. This may depend on whether you have a family history of breast cancer.  BRCA-related cancer screening. This may be done if you have a family history of breast, ovarian, tubal, or peritoneal cancers.  Pelvic exam and Pap test. ? This may be done every 3 years starting at age 68. ? Starting at age 63, this may be done every 5 years if you have a Pap test in combination with an HPV test. Other tests  STD (sexually transmitted disease) testing, if you are at risk.  Bone density scan. This is done to screen for  osteoporosis. You may have this scan if you are at high risk for osteoporosis. Talk with your health care provider about your test results, treatment options, and if necessary, the need for more tests. Follow these instructions at home: Eating and drinking  Eat a diet that includes fresh fruits and vegetables, whole grains, lean protein, and low-fat dairy products.  Take vitamin and mineral supplements as recommended by your health care provider.  Do not  drink alcohol if: ? Your health care provider tells you not to drink. ? You are pregnant, may be pregnant, or are planning to become pregnant.  If you drink alcohol: ? Limit how much you have to 0-1 drink a day. ? Be aware of how much alcohol is in your drink. In the [place] one drink equals one 12 oz bottle of beer (355 mL), one 5 oz glass of wine (148 mL), or one 1 oz glass of hard liquor (44 mL).   Lifestyle  Take daily care of your teeth and gums. Brush your teeth every morning and night with fluoride toothpaste. Floss one time each day.  Stay active. Exercise for at least 30 minutes 5 or more days each week.  Do not use any products that contain nicotine or tobacco, such as cigarettes, e-cigarettes, and chewing tobacco. If you need help quitting, ask your health care provider.  Do not use drugs.  If you are sexually active, practice safe sex. Use a condom or other form of protection to prevent STIs (sexually transmitted infections).  If you do not wish to become pregnant, use a form of birth control. If you plan to become pregnant, see your health care provider for a prepregnancy visit.  If told by your health care provider, take low-dose aspirin daily starting at age 3.  Find healthy ways to cope with stress, such as: ? Meditation, yoga, or listening to music. ? Journaling. ? Talking to a trusted person. ? Spending time with friends and family. Safety  Always wear your seat belt while driving or riding in a vehicle.  Do not drive: ? If you have been drinking alcohol. Do not ride with someone who has been drinking. ? When you are tired or distracted. ? While texting.  Wear a helmet and other protective equipment during sports activities.  If you have firearms in your house, make sure you follow all gun safety procedures. What's next?  Visit your health care provider once a year for an annual wellness visit.  Ask your health care provider how often you should have your  eyes and teeth checked.  Stay up to date on all vaccines. This information is not intended to replace advice given to you by your health care provider. Make sure you discuss any questions you have with your health care provider. Document Revised: 07/28/2020 Document Reviewed: 07/05/2018 Elsevier Patient Education  2021 [employer]. .

## 2020-11-30 NOTE — Progress Notes (Signed)
Patient presents to clinic today for annual exam.  Patient is fasting for labs.  Is currently followed by fertility specialist, undergoing IVF.  Tolerating treatments well thus far.  Acute Concerns: Denies acute concerns at today's visit.  Health Maintenance: Immunizations --agrees to flu shot today Mammogram --up-to-date.  Next mammogram scheduled for 01/26/2021. PAP --up-to-date.  Followed by GYN.  Past Medical History:  Diagnosis Date  . Chronic constipation   . Hypothyroidism   . Migraine   . Urticaria   . Vitamin D deficiency     Past Surgical History:  Procedure Laterality Date  . BREAST BIOPSY Left   . NOSE SURGERY      Current Outpatient Medications on File Prior to Visit  Medication Sig Dispense Refill  . ASPIRIN LOW DOSE 81 MG EC tablet Take 81 mg by mouth daily.    . budesonide-formoterol (SYMBICORT) 80-4.5 MCG/ACT inhaler Inhale 2 puffs into the lungs in the morning and at bedtime. with spacer and rinse mouth afterwards. 1 each 5  . cetirizine (ZYRTEC) 10 MG tablet Take 10 mg by mouth daily.    Marland Kitchen EPINEPHrine 0.3 mg/0.3 mL IJ SOAJ injection Inject 0.3 mLs (0.3 mg total) into the muscle once as needed for up to 1 dose for anaphylaxis. 0.3 mL 2  . estradiol (ESTRACE) 2 MG tablet Take by mouth.    . levothyroxine (SYNTHROID) 75 MCG tablet TAKE 1 TABLET BY MOUTH DAILY BEFORE BREAKFAST. 90 tablet 1  . nitrofurantoin (MACRODANTIN) 50 MG capsule Take as needed with intercourse 30 capsule 1  . Olopatadine HCl 0.2 % SOLN Apply 1 drop to eye daily as needed (itchy/watery eyes). 2.5 mL 5  . ondansetron (ZOFRAN-ODT) 8 MG disintegrating tablet For further refills, please schedule an appointment with provider. 20 tablet 0  . rizatriptan (MAXALT) 10 MG tablet TAKE 1 TABLET BY MOUTH AS NEEDED FOR MIGRAINE. MAY REPEAT IN 2 HOURS IF NEEDED 10 tablet 3  . VIENVA 0.1-20 MG-MCG tablet Take by mouth.     No current facility-administered medications on file prior to visit.     Allergies  Allergen Reactions  . Claritin [Loratadine]     shakiness  . Lexapro [Escitalopram] Nausea Only    Family History  Problem Relation Age of Onset  . Urolithiasis Father   . Cancer Mother   . Hypertension Mother   . Breast cancer Mother 4  . Allergic rhinitis Brother   . Breast cancer Maternal Grandmother        dx under 38  . Stomach cancer Paternal Grandmother        dx in her 80s  . Alcohol abuse Maternal Uncle   . Prostate cancer Paternal Uncle        mid 41s  . Asthma Neg Hx   . Eczema Neg Hx   . Urticaria Neg Hx     Social History   Socioeconomic History  . Marital status: Married    Spouse name: Not on file  . Number of children: 0  . Years of education: Not on file  . Highest education level: Not on file  Occupational History  . Not on file  Tobacco Use  . Smoking status: Never Smoker  . Smokeless tobacco: Never Used  Vaping Use  . Vaping Use: Never used  Substance and Sexual Activity  . Alcohol use: Not Currently    Alcohol/week: 0.0 standard drinks  . Drug use: No  . Sexual activity: Yes    Partners: Male  Comment: husband with vasectomy  Other Topics Concern  . Not on file  Social History Narrative  . Not on file   Social Determinants of Health   Financial Resource Strain: Not on file  Food Insecurity: Not on file  Transportation Needs: Not on file  Physical Activity: Not on file  Stress: Not on file  Social Connections: Not on file  Intimate Partner Violence: Not on file   Review of Systems  Constitutional: Negative for fever and weight loss.  HENT: Negative for ear discharge, ear pain, hearing loss and tinnitus.   Eyes: Negative for blurred vision, double vision, photophobia and pain.  Respiratory: Negative for cough and shortness of breath.   Cardiovascular: Negative for chest pain and palpitations.  Gastrointestinal: Negative for abdominal pain, blood in stool, constipation, diarrhea, heartburn, melena, nausea and  vomiting.  Genitourinary: Negative for dysuria, flank pain, frequency, hematuria and urgency.  Musculoskeletal: Negative for falls.  Neurological: Negative for dizziness, loss of consciousness and headaches.  Endo/Heme/Allergies: Negative for environmental allergies.  Psychiatric/Behavioral: Negative for depression, hallucinations, substance abuse and suicidal ideas. The patient is not nervous/anxious and does not have insomnia.     BP 100/70   Pulse 81   Temp 98.8 F (37.1 C) (Temporal)   Resp 14   Ht 5' (1.524 m)   Wt 144 lb (65.3 kg)   SpO2 99%   BMI 28.12 kg/m   Physical Exam Vitals reviewed.  HENT:     Head: Normocephalic and atraumatic.     Right Ear: Tympanic membrane, ear canal and external ear normal.     Left Ear: Tympanic membrane, ear canal and external ear normal.     Nose: Nose normal. No mucosal edema.     Mouth/Throat:     Mouth: Oropharynx is clear and moist and mucous membranes are normal.     Pharynx: Uvula midline. No oropharyngeal exudate or posterior oropharyngeal erythema.  Eyes:     Conjunctiva/sclera: Conjunctivae normal.     Pupils: Pupils are equal, round, and reactive to light.  Neck:     Thyroid: No thyromegaly.  Cardiovascular:     Rate and Rhythm: Normal rate and regular rhythm.     Pulses: Intact distal pulses.     Heart sounds: Normal heart sounds.  Pulmonary:     Effort: Pulmonary effort is normal. No respiratory distress.     Breath sounds: Normal breath sounds. No wheezing or rales.  Abdominal:     General: Bowel sounds are normal. There is no distension.     Palpations: Abdomen is soft. There is no mass.     Tenderness: There is no abdominal tenderness. There is no guarding or rebound.  Musculoskeletal:     Cervical back: Neck supple.  Lymphadenopathy:     Cervical: No cervical adenopathy.  Skin:    General: Skin is warm and dry.     Findings: No rash.  Neurological:     Mental Status: She is alert and oriented to person,  place, and time.     Cranial Nerves: No cranial nerve deficit.    Assessment/Plan: 1. Visit for preventive health examination Depression screen negative. Health Maintenance reviewed. Preventive schedule discussed and handout given in AVS. Will obtain fasting labs today.  - CBC with Differential/Platelet - Comprehensive metabolic panel - Lipid panel - Hemoglobin A1c - B12 - Iron, TIBC and Ferritin Panel  2. Hypothyroidism, unspecified type Taking medication as directed.  Repeat TSH level today. - TSH  3. H/O vitamin  D deficiency Repeat levels today. - Vitamin D (25 hydroxy)  4. Need for immunization against influenza Flu shot updated - Flu Vaccine QUAD 36+ mos IM  5. Generalized anxiety disorder Stable.  Medications refilled. - diazepam (VALIUM) 10 MG tablet; Take 1 tablet (10 mg total) by mouth every 12 (twelve) hours as needed. for anxiety  Dispense: 60 tablet; Refill: 0    This visit occurred during the SARS-CoV-2 public health emergency.  Safety protocols were in place, including screening questions prior to the visit, additional usage of staff PPE, and extensive cleaning of exam room while observing appropriate contact time as indicated for disinfecting solutions.    Leeanne Rio, PA-C

## 2020-12-01 LAB — IRON,TIBC AND FERRITIN PANEL
%SAT: 34 % (calc) (ref 16–45)
Ferritin: 50 ng/mL (ref 16–232)
Iron: 136 ug/dL (ref 40–190)
TIBC: 398 mcg/dL (calc) (ref 250–450)

## 2020-12-03 ENCOUNTER — Ambulatory Visit (INDEPENDENT_AMBULATORY_CARE_PROVIDER_SITE_OTHER): Payer: Federal, State, Local not specified - PPO

## 2020-12-03 ENCOUNTER — Other Ambulatory Visit: Payer: Self-pay

## 2020-12-03 DIAGNOSIS — J309 Allergic rhinitis, unspecified: Secondary | ICD-10-CM

## 2020-12-07 DIAGNOSIS — J302 Other seasonal allergic rhinitis: Secondary | ICD-10-CM | POA: Diagnosis not present

## 2020-12-07 NOTE — Progress Notes (Signed)
VIALS EXP 12-07-21 

## 2020-12-08 ENCOUNTER — Other Ambulatory Visit: Payer: Self-pay

## 2020-12-08 ENCOUNTER — Ambulatory Visit (INDEPENDENT_AMBULATORY_CARE_PROVIDER_SITE_OTHER): Payer: Federal, State, Local not specified - PPO

## 2020-12-08 DIAGNOSIS — J309 Allergic rhinitis, unspecified: Secondary | ICD-10-CM | POA: Diagnosis not present

## 2020-12-10 DIAGNOSIS — Z3200 Encounter for pregnancy test, result unknown: Secondary | ICD-10-CM | POA: Diagnosis not present

## 2020-12-14 DIAGNOSIS — Z3201 Encounter for pregnancy test, result positive: Secondary | ICD-10-CM | POA: Diagnosis not present

## 2020-12-15 ENCOUNTER — Other Ambulatory Visit: Payer: Self-pay

## 2020-12-15 ENCOUNTER — Ambulatory Visit (INDEPENDENT_AMBULATORY_CARE_PROVIDER_SITE_OTHER): Payer: Federal, State, Local not specified - PPO

## 2020-12-15 DIAGNOSIS — J309 Allergic rhinitis, unspecified: Secondary | ICD-10-CM

## 2020-12-22 ENCOUNTER — Other Ambulatory Visit: Payer: Self-pay

## 2020-12-22 ENCOUNTER — Ambulatory Visit (INDEPENDENT_AMBULATORY_CARE_PROVIDER_SITE_OTHER): Payer: Federal, State, Local not specified - PPO

## 2020-12-22 DIAGNOSIS — J309 Allergic rhinitis, unspecified: Secondary | ICD-10-CM

## 2020-12-24 NOTE — Progress Notes (Deleted)
GYNECOLOGY  VISIT  CC:   ***  HPI: 45 y.o. G0P0000 Married White or Caucasian female here for ***.     GYNECOLOGIC HISTORY: No LMP recorded. Contraception: *** Menopausal hormone therapy: ***  Patient Active Problem List   Diagnosis Date Noted  . Seasonal and perennial allergic rhinoconjunctivitis 07/14/2020  . Shortness of breath 04/09/2020  . Drug reaction 09/11/2017  . Chronic sinusitis 08/29/2017  . Chronic rhinitis 08/02/2017  . Primary insomnia 08/02/2017  . Constipation 10/13/2016  . Sciatica, right 08/02/2016  . H/O vitamin D deficiency 06/08/2016  . Visit for preventive health examination 06/18/2015  . Generalized anxiety disorder 05/21/2015  . Chronic migraine without aura without status migrainosus, not intractable 05/05/2015  . Sciatica 05/05/2015  . Hypothyroidism 01/06/2014  . Family history of breast cancer 12/27/2013  . Abdominal pain 07/26/2012  . Hypokalemia 07/26/2012    Past Medical History:  Diagnosis Date  . Chronic constipation   . Hypothyroidism   . Migraine   . Urticaria   . Vitamin D deficiency     Past Surgical History:  Procedure Laterality Date  . BREAST BIOPSY Left   . NOSE SURGERY      MEDS:   Current Outpatient Medications on File Prior to Visit  Medication Sig Dispense Refill  . ASPIRIN LOW DOSE 81 MG EC tablet Take 81 mg by mouth daily.    . budesonide-formoterol (SYMBICORT) 80-4.5 MCG/ACT inhaler Inhale 2 puffs into the lungs in the morning and at bedtime. with spacer and rinse mouth afterwards. 1 each 5  . cetirizine (ZYRTEC) 10 MG tablet Take 10 mg by mouth daily.    . diazepam (VALIUM) 10 MG tablet Take 1 tablet (10 mg total) by mouth every 12 (twelve) hours as needed. for anxiety 60 tablet 0  . EPINEPHrine 0.3 mg/0.3 mL IJ SOAJ injection Inject 0.3 mLs (0.3 mg total) into the muscle once as needed for up to 1 dose for anaphylaxis. 0.3 mL 2  . estradiol (ESTRACE) 2 MG tablet Take by mouth.    . levothyroxine (SYNTHROID)  75 MCG tablet TAKE 1 TABLET BY MOUTH DAILY BEFORE BREAKFAST. 90 tablet 1  . nitrofurantoin (MACRODANTIN) 50 MG capsule Take as needed with intercourse 30 capsule 1  . Olopatadine HCl 0.2 % SOLN Apply 1 drop to eye daily as needed (itchy/watery eyes). 2.5 mL 5  . ondansetron (ZOFRAN-ODT) 8 MG disintegrating tablet For further refills, please schedule an appointment with provider. 20 tablet 0  . rizatriptan (MAXALT) 10 MG tablet TAKE 1 TABLET BY MOUTH AS NEEDED FOR MIGRAINE. MAY REPEAT IN 2 HOURS IF NEEDED 10 tablet 3  . VIENVA 0.1-20 MG-MCG tablet Take by mouth.     No current facility-administered medications on file prior to visit.    ALLERGIES: Claritin [loratadine] and Lexapro [escitalopram]  Family History  Problem Relation Age of Onset  . Urolithiasis Father   . Cancer Mother   . Hypertension Mother   . Breast cancer Mother 23  . Allergic rhinitis Brother   . Breast cancer Maternal Grandmother        dx under 58  . Stomach cancer Paternal Grandmother        dx in her 5s  . Alcohol abuse Maternal Uncle   . Prostate cancer Paternal Uncle        mid 39s  . Asthma Neg Hx   . Eczema Neg Hx   . Urticaria Neg Hx     SH:  ***  Review of Systems  PHYSICAL EXAMINATION:    There were no vitals taken for this visit.    General appearance: alert, cooperative, no acute distress  CV:  {Exam; heart brief:31539} Lungs:  {pe lungs ob:314451::"clear to auscultation, no wheezes, rales or rhonchi, symmetric air entry"} Breasts: {Exam; breast:13139::"normal appearance, no masses or tenderness"} Abdomen: soft, non-tender; bowel sounds normal; no masses,  no organomegaly Lymph:  no inguinal LAD noted  Pelvic: External genitalia:  no lesions              Urethra:  normal appearing urethra with no masses, tenderness or lesions              Bartholins and Skenes: normal                 Vagina: normal appearing vagina               Cervix: {CHL AMB PHY EX CERVIX NORM  [phone]::"no lesions"}              Bimanual Exam:  Uterus:  {CHL AMB PHY EX UTERUS NORM [phone]::"normal size, contour, position, consistency, mobility, non-tender"}              Adnexa: {CHL AMB PHY EX ADNEXA NO MASS DEFAULT:[phone]::"no mass, fullness, tenderness"}               Chaperone, ***, CMA, was present for exam.  Assessment: ***  Plan: ***   {NUMBERS; -10-45 JOINT ROM:10287} minutes of total time was spent for this patient encounter, including preparation, face-to-face counseling with the patient and coordination of care, and documentation of the encounter.

## 2020-12-28 ENCOUNTER — Ambulatory Visit: Payer: Federal, State, Local not specified - PPO | Admitting: Nurse Practitioner

## 2020-12-28 DIAGNOSIS — O200 Threatened abortion: Secondary | ICD-10-CM | POA: Diagnosis not present

## 2020-12-31 ENCOUNTER — Other Ambulatory Visit: Payer: Self-pay

## 2020-12-31 ENCOUNTER — Ambulatory Visit (INDEPENDENT_AMBULATORY_CARE_PROVIDER_SITE_OTHER): Payer: Federal, State, Local not specified - PPO

## 2020-12-31 DIAGNOSIS — J309 Allergic rhinitis, unspecified: Secondary | ICD-10-CM | POA: Diagnosis not present

## 2021-01-01 DIAGNOSIS — Z3201 Encounter for pregnancy test, result positive: Secondary | ICD-10-CM | POA: Diagnosis not present

## 2021-01-01 DIAGNOSIS — O219 Vomiting of pregnancy, unspecified: Secondary | ICD-10-CM | POA: Diagnosis not present

## 2021-01-01 DIAGNOSIS — N911 Secondary amenorrhea: Secondary | ICD-10-CM | POA: Diagnosis not present

## 2021-01-05 ENCOUNTER — Other Ambulatory Visit: Payer: Self-pay

## 2021-01-05 ENCOUNTER — Ambulatory Visit (INDEPENDENT_AMBULATORY_CARE_PROVIDER_SITE_OTHER): Payer: Federal, State, Local not specified - PPO

## 2021-01-05 DIAGNOSIS — J309 Allergic rhinitis, unspecified: Secondary | ICD-10-CM

## 2021-01-12 ENCOUNTER — Encounter: Payer: Self-pay | Admitting: Allergy

## 2021-01-12 ENCOUNTER — Other Ambulatory Visit: Payer: Self-pay

## 2021-01-12 ENCOUNTER — Ambulatory Visit: Payer: Federal, State, Local not specified - PPO | Admitting: Allergy

## 2021-01-12 VITALS — HR 85 | Temp 97.6°F | Resp 18

## 2021-01-12 DIAGNOSIS — Z3490 Encounter for supervision of normal pregnancy, unspecified, unspecified trimester: Secondary | ICD-10-CM | POA: Insufficient documentation

## 2021-01-12 DIAGNOSIS — J309 Allergic rhinitis, unspecified: Secondary | ICD-10-CM | POA: Diagnosis not present

## 2021-01-12 DIAGNOSIS — H1013 Acute atopic conjunctivitis, bilateral: Secondary | ICD-10-CM

## 2021-01-12 DIAGNOSIS — R0602 Shortness of breath: Secondary | ICD-10-CM

## 2021-01-12 DIAGNOSIS — J302 Other seasonal allergic rhinitis: Secondary | ICD-10-CM

## 2021-01-12 DIAGNOSIS — H1010 Acute atopic conjunctivitis, unspecified eye: Secondary | ICD-10-CM

## 2021-01-12 DIAGNOSIS — J3089 Other allergic rhinitis: Secondary | ICD-10-CM

## 2021-01-12 MED ORDER — ALBUTEROL SULFATE HFA 108 (90 BASE) MCG/ACT IN AERS: 2.0000 | INHALATION_SPRAY | RESPIRATORY_TRACT | 2 refills | Status: DC | PRN

## 2021-01-12 NOTE — Progress Notes (Signed)
Follow Up Note  RE: Lisa Townsend MRN: [id number] DOB: 01-21-1976 Date of Office Visit: 01/12/2021  Referring provider: Brunetta Jeans, PA-C Primary care provider: Brunetta Jeans, PA-C  Chief Complaint: Asthma (Shortness of breathing with talking and some coughing and can not breath well)  History of Present Illness: I had the pleasure of seeing Lisa Townsend for a follow up visit at the [facility] on 01/12/2021. She is a 45 y.o. female, who is being followed for allergic rhinoconjunctivitis on AIT and shortness of breath. Her previous allergy office visit was on 08/06/2020 with Dr. Maudie Mercury. Today is a regular follow up visit.  Seasonal and perennial allergic rhinoconjunctivitis Patient's allergy injections are going well with no issues. Takes zyrtec the night before injections.  Not needed to use any eye drops or nasal sprays.   Shortness of breath Patient noted some worsening shortness of breath the past 1 month. This mainly happens when she is talking. She sometimes has associated coughing but no wheezing. She apparently does not have an albuterol inhaler.  She is [redacted] weeks pregnant and has a lot of nausea. She also talks on the phone a lot at her job and has some issues with coughing and shortness of breath at times.  Currently on Symbicort 65mcg 2 puffs twice a day and did not notice any benefit.  Patient is scheduled to see her OB on 3/23. She is taking dramamine for nausea.  This is her first pregnancy.  Assessment and Plan: Lisa Townsend is a 45 y.o. female with: Shortness of breath Past history - Daily shortness of breath for 3-4 months. No triggers noted. No prior diagnosis of asthma. Used albuterol during her COVID-19 infection with unknown benefit. No recent CXR. CBC diff, TSH normal in December 2020. 2021 spirometry showed: possible restrictive disease with 10% improvement in FEV1 post bronchodilator treatment. Clinically feeling some  improvement. Interim history - not sure if Symbicort is helping but now worsening sob since pregnant and having nausea - worse after talking a lot which her work requires.   Today's spirometry improved from previous one.  Daily controller medication(s): continue Symbicort 51mcg 2 puffs twice a day with spacer and rinse mouth afterwards. May use albuterol rescue inhaler 2 puffs every 4 to 6 hours as needed for shortness of breath, chest tightness, coughing, and wheezing. Monitor frequency of use.   If not improved, will need to further evaluate as to why you are getting shortness of breath - ENT referral to look at vocal cords.  Repeat spirometry at next visit.   Seasonal and perennial allergic rhinoconjunctivitis Past history - Perennial rhino conjunctivitis symptoms for the past few years but worsening each year. Spring and summer is the worst. 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 + reflux. Does not tolerate Claritin. 2021 skin testing showed: Positive to grass, weed, ragweed, trees, mold. Started AIT on 04/30/2020 (G-RW-T & molds) Interim history - doing well and only using Zyrtec prn. [redacted] week pregnant now.   Continue allergy injections - given today.  Will freeze at red 0.17mL every 2 weeks during pregnancy.   Continue environmental control measures as below.  May use over the counter antihistamines such as Zyrtec (cetirizine) daily as needed -  safe to use during pregnancy.  Nasal saline spray (i.e., Simply Saline) or nasal saline lavage (i.e., NeilMed) is recommended as needed and prior to medicated nasal sprays.  If you need a nasal spray let me know   Rhinocort  is the safest to use in pregnancy.   May use olopatadine eye drops 0.2% once a day as needed for itchy/watery eyes - safe to use during pregnancy.  Return in about 2 months (around 03/14/2021).  Meds ordered this encounter  Medications  . albuterol (VENTOLIN HFA) 108 (90 Base) MCG/ACT inhaler    Sig: Inhale  2 puffs into the lungs every 4 (four) hours as needed for wheezing or shortness of breath (coughing).    Dispense:  8 g    Refill:  2   Lab Orders  No laboratory test(s) ordered today    Diagnostics: Spirometry:  Tracings reviewed. Her effort: Good reproducible efforts. FVC: 2.34L FEV1: 2.09L, 84% predicted FEV1/FVC ratio: 89% Interpretation: Spirometry consistent with possible restrictive disease.  Please see scanned spirometry results for details.  Medication List:  Current Outpatient Medications  Medication Sig Dispense Refill  . albuterol (VENTOLIN HFA) 108 (90 Base) MCG/ACT inhaler Inhale 2 puffs into the lungs every 4 (four) hours as needed for wheezing or shortness of breath (coughing). 8 g 2  . budesonide-formoterol (SYMBICORT) 80-4.5 MCG/ACT inhaler Inhale 2 puffs into the lungs in the morning and at bedtime. with spacer and rinse mouth afterwards. 1 each 5  . cetirizine (ZYRTEC) 10 MG tablet Take 10 mg by mouth daily.    Marland Kitchen EPINEPHrine 0.3 mg/0.3 mL IJ SOAJ injection Inject 0.3 mLs (0.3 mg total) into the muscle once as needed for up to 1 dose for anaphylaxis. 0.3 mL 2  . estradiol (ESTRACE) 2 MG tablet Take by mouth.    . levothyroxine (SYNTHROID) 75 MCG tablet TAKE 1 TABLET BY MOUTH DAILY BEFORE BREAKFAST. 90 tablet 1  . rizatriptan (MAXALT) 10 MG tablet TAKE 1 TABLET BY MOUTH AS NEEDED FOR MIGRAINE. MAY REPEAT IN 2 HOURS IF NEEDED 10 tablet 3  . diazepam (VALIUM) 10 MG tablet Take 1 tablet (10 mg total) by mouth every 12 (twelve) hours as needed. for anxiety (Patient not taking: Reported on 01/12/2021) 60 tablet 0  . nitrofurantoin (MACRODANTIN) 50 MG capsule Take as needed with intercourse (Patient not taking: Reported on 01/12/2021) 30 capsule 1  . Olopatadine HCl 0.2 % SOLN Apply 1 drop to eye daily as needed (itchy/watery eyes). (Patient not taking: Reported on 01/12/2021) 2.5 mL 5  . VIENVA 0.1-20 MG-MCG tablet Take by mouth. (Patient not taking: Reported on 01/12/2021)      No current facility-administered medications for this visit.   Allergies: Allergies  Allergen Reactions  . Claritin [Loratadine]     shakiness  . Lexapro [Escitalopram] Nausea Only   I reviewed her past medical history, social history, family history, and environmental history and no significant changes have been reported from her previous visit.  Review of Systems  Constitutional: Negative for appetite change, chills, fever and unexpected weight change.  HENT: Negative for congestion, postnasal drip, rhinorrhea and sneezing.   Eyes: Negative for itching.  Respiratory: Positive for shortness of breath. Negative for cough, chest tightness and wheezing.   Cardiovascular: Negative for chest pain.  Gastrointestinal: Positive for nausea. Negative for abdominal pain.  Genitourinary: Negative for difficulty urinating.  Skin: Negative for rash.  Allergic/Immunologic: Positive for environmental allergies. Negative for food allergies.  Neurological: Positive for headaches.   Objective: Pulse 85   Temp 97.6 F (36.4 C) (Temporal)   Resp 18   SpO2 97%  There is no height or weight on file to calculate BMI. Physical Exam Vitals and nursing note reviewed.  Constitutional:  Appearance: Normal appearance. She is well-developed.  HENT:     Head: Normocephalic and atraumatic.     Right Ear: External ear normal.     Left Ear: External ear normal.     Nose: Nose normal.     Mouth/Throat:     Mouth: Mucous membranes are moist.     Pharynx: Oropharynx is clear.  Eyes:     Conjunctiva/sclera: Conjunctivae normal.  Cardiovascular:     Rate and Rhythm: Normal rate and regular rhythm.     Heart sounds: Normal heart sounds. No murmur heard. No friction rub. No gallop.   Pulmonary:     Effort: Pulmonary effort is normal.     Breath sounds: Normal breath sounds. No wheezing or rales.  Musculoskeletal:     Cervical back: Neck supple.  Skin:    General: Skin is warm.     Findings: No  rash.  Neurological:     Mental Status: She is alert and oriented to person, place, and time.  Psychiatric:        Behavior: Behavior normal.    Previous notes and tests were reviewed. The plan was reviewed with the patient/family, and all questions/concerned were addressed.  It was my pleasure to see Lexys today and participate in her care. Please feel free to contact me with any questions or concerns.  Sincerely,  Rexene Alberts, DO Allergy & Immunology  [facility] office: [place] office: [phone]

## 2021-01-12 NOTE — Assessment & Plan Note (Signed)
Past history - Perennial rhino conjunctivitis symptoms for the past few years but worsening each year. Spring and summer is the worst. 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 + reflux. Does not tolerate Claritin. 2021 skin testing showed: Positive to grass, weed, ragweed, trees, mold. Started AIT on 04/30/2020 (G-RW-T & molds) Interim history - doing well and only using Zyrtec prn. [redacted] week pregnant now.   Continue allergy injections - given today.  Will freeze at red 0.70mL every 2 weeks during pregnancy.   Continue environmental control measures as below.  May use over the counter antihistamines such as Zyrtec (cetirizine) daily as needed -  safe to use during pregnancy.  Nasal saline spray (i.e., Simply Saline) or nasal saline lavage (i.e., NeilMed) is recommended as needed and prior to medicated nasal sprays.  If you need a nasal spray let me know   Rhinocort is the safest to use in pregnancy.   May use olopatadine eye drops 0.2% once a day as needed for itchy/watery eyes - safe to use during pregnancy.

## 2021-01-12 NOTE — Assessment & Plan Note (Signed)
Past history - Daily shortness of breath for 3-4 months. No triggers noted. No prior diagnosis of asthma. Used albuterol during her COVID-19 infection with unknown benefit. No recent CXR. CBC diff, TSH normal in December 2020. 2021 spirometry showed: possible restrictive disease with 10% improvement in FEV1 post bronchodilator treatment. Clinically feeling some improvement. Interim history - not sure if Symbicort is helping but now worsening sob since pregnant and having nausea - worse after talking a lot which her work requires.   Today's spirometry improved from previous one.  Daily controller medication(s): continue Symbicort 76mcg 2 puffs twice a day with spacer and rinse mouth afterwards. May use albuterol rescue inhaler 2 puffs every 4 to 6 hours as needed for shortness of breath, chest tightness, coughing, and wheezing. Monitor frequency of use.   If not improved, will need to further evaluate as to why you are getting shortness of breath - ENT referral to look at vocal cords.  Repeat spirometry at next visit.

## 2021-01-12 NOTE — Patient Instructions (Addendum)
Environmental allergies  Past skin testing showed: Positive to grass, weed, ragweed, trees, mold.   Continue allergy injections - given today.  Will freeze at red 0.35mL every 2 weeks during pregnancy.   Continue environmental control measures as below.  May use over the counter antihistamines such as Zyrtec (cetirizine) daily as needed -  safe to use during pregnancy.  Nasal saline spray (i.e., Simply Saline) or nasal saline lavage (i.e., NeilMed) is recommended as needed and prior to medicated nasal sprays.  If you need a nasal spray let me know   Rhinocort is the safest to use in pregnancy.   May use olopatadine eye drops 0.2% once a day as needed for itchy/watery eyes - safe to use during pregnancy.  Shortness of breath: Daily controller medication(s): continue Symbicort 64mcg 2 puffs twice a day with spacer and rinse mouth afterwards. May use albuterol rescue inhaler 2 puffs every 4 to 6 hours as needed for shortness of breath, chest tightness, coughing, and wheezing. Monitor frequency of use.   If not improved, will need to further evaluate as to why you are getting shortness of breath Breathing control goals:  Full participation in all desired activities (may need albuterol before activity) Albuterol use two times or less a week on average (not counting use with activity) Cough interfering with sleep two times or less a month Oral steroids no more than once a year No hospitalizations  Follow up in 2-3 months or sooner if needed to check your breathing.  Reducing Pollen Exposure . Pollen seasons: trees (spring), grass (summer) and ragweed/weeds (fall). Marland Kitchen Keep windows closed in your home and car to lower pollen exposure.  Susa Simmonds air conditioning in the bedroom and throughout the house if possible.  . Avoid going out in dry windy days - especially early morning. . Pollen counts are highest between 5 - 10 AM and on dry, hot and windy days.  . Save outside activities for  late afternoon or after a heavy rain, when pollen levels are lower.  . Avoid mowing of grass if you have grass pollen allergy. Marland Kitchen Be aware that pollen can also be transported indoors on people and pets.  . Dry your clothes in an automatic dryer rather than hanging them outside where they might collect pollen.  . Rinse hair and eyes before bedtime.  Mold Control . Mold and fungi can grow on a variety of surfaces provided certain temperature and moisture conditions exist.  . Outdoor molds grow on plants, decaying vegetation and soil. The major outdoor mold, Alternaria and Cladosporium, are found in very high numbers during hot and dry conditions. Generally, a late summer - fall peak is seen for common outdoor fungal spores. Rain will temporarily lower outdoor mold spore count, but counts rise rapidly when the rainy period ends. . The most important indoor molds are Aspergillus and Penicillium. Dark, humid and poorly ventilated basements are ideal sites for mold growth. The next most common sites of mold growth are the bathroom and the kitchen. Outdoor (Seasonal) Mold Control . Use air conditioning and keep windows closed. . Avoid exposure to decaying vegetation. Marland Kitchen Avoid leaf raking. . Avoid grain handling. . Consider wearing a face mask if working in moldy areas.  Indoor (Perennial) Mold Control  . Maintain humidity below 50%. . Get rid of mold growth on hard surfaces with water, detergent and, if necessary, 5% bleach (do not mix with other cleaners). Then dry the area completely. If mold covers an area more  than 10 square feet, consider hiring an Patent examiner. . For clothing, washing with soap and water is best. If moldy items cannot be cleaned and dried, throw them away. . Remove sources e.g. contaminated carpets. . Repair and seal leaking roofs or pipes. Using dehumidifiers in damp basements may be helpful, but empty the water and clean units regularly to prevent mildew from  forming. All rooms, especially basements, bathrooms and kitchens, require ventilation and cleaning to deter mold and mildew growth. Avoid carpeting on concrete or damp floors, and storing items in damp areas.

## 2021-01-26 ENCOUNTER — Ambulatory Visit (INDEPENDENT_AMBULATORY_CARE_PROVIDER_SITE_OTHER): Payer: Federal, State, Local not specified - PPO

## 2021-01-26 ENCOUNTER — Other Ambulatory Visit: Payer: Federal, State, Local not specified - PPO

## 2021-01-26 ENCOUNTER — Other Ambulatory Visit: Payer: Self-pay

## 2021-01-26 DIAGNOSIS — J309 Allergic rhinitis, unspecified: Secondary | ICD-10-CM | POA: Diagnosis not present

## 2021-01-27 DIAGNOSIS — O26891 Other specified pregnancy related conditions, first trimester: Secondary | ICD-10-CM | POA: Diagnosis not present

## 2021-01-27 DIAGNOSIS — Z3A10 10 weeks gestation of pregnancy: Secondary | ICD-10-CM | POA: Diagnosis not present

## 2021-01-27 DIAGNOSIS — O09811 Supervision of pregnancy resulting from assisted reproductive technology, first trimester: Secondary | ICD-10-CM | POA: Diagnosis not present

## 2021-01-27 DIAGNOSIS — Z3689 Encounter for other specified antenatal screening: Secondary | ICD-10-CM | POA: Diagnosis not present

## 2021-01-27 DIAGNOSIS — Z113 Encounter for screening for infections with a predominantly sexual mode of transmission: Secondary | ICD-10-CM | POA: Diagnosis not present

## 2021-01-27 DIAGNOSIS — E039 Hypothyroidism, unspecified: Secondary | ICD-10-CM | POA: Diagnosis not present

## 2021-01-27 LAB — OB RESULTS CONSOLE ABO/RH: RH Type: POSITIVE

## 2021-01-27 LAB — OB RESULTS CONSOLE RUBELLA ANTIBODY, IGM: Rubella: IMMUNE

## 2021-01-27 LAB — OB RESULTS CONSOLE HIV ANTIBODY (ROUTINE TESTING): HIV: NONREACTIVE

## 2021-01-27 LAB — OB RESULTS CONSOLE GC/CHLAMYDIA
Chlamydia: NEGATIVE
Gonorrhea: NEGATIVE

## 2021-01-27 LAB — OB RESULTS CONSOLE ANTIBODY SCREEN
Antibody Screen: NEGATIVE
Antibody Screen: NEGATIVE

## 2021-01-27 LAB — OB RESULTS CONSOLE RPR: RPR: NONREACTIVE

## 2021-01-27 LAB — OB RESULTS CONSOLE HEPATITIS B SURFACE ANTIGEN: Hepatitis B Surface Ag: NEGATIVE

## 2021-02-11 ENCOUNTER — Ambulatory Visit (INDEPENDENT_AMBULATORY_CARE_PROVIDER_SITE_OTHER): Payer: Federal, State, Local not specified - PPO

## 2021-02-11 ENCOUNTER — Other Ambulatory Visit: Payer: Self-pay

## 2021-02-11 DIAGNOSIS — J309 Allergic rhinitis, unspecified: Secondary | ICD-10-CM | POA: Diagnosis not present

## 2021-02-11 DIAGNOSIS — O09512 Supervision of elderly primigravida, second trimester: Secondary | ICD-10-CM | POA: Diagnosis not present

## 2021-02-11 DIAGNOSIS — O09511 Supervision of elderly primigravida, first trimester: Secondary | ICD-10-CM | POA: Diagnosis not present

## 2021-02-22 ENCOUNTER — Ambulatory Visit: Payer: Federal, State, Local not specified - PPO | Admitting: Family Medicine

## 2021-02-25 ENCOUNTER — Other Ambulatory Visit: Payer: Self-pay

## 2021-02-25 ENCOUNTER — Ambulatory Visit (INDEPENDENT_AMBULATORY_CARE_PROVIDER_SITE_OTHER): Payer: Federal, State, Local not specified - PPO

## 2021-02-25 DIAGNOSIS — J309 Allergic rhinitis, unspecified: Secondary | ICD-10-CM | POA: Diagnosis not present

## 2021-03-11 ENCOUNTER — Ambulatory Visit (INDEPENDENT_AMBULATORY_CARE_PROVIDER_SITE_OTHER): Payer: Federal, State, Local not specified - PPO

## 2021-03-11 ENCOUNTER — Other Ambulatory Visit: Payer: Self-pay

## 2021-03-11 DIAGNOSIS — J309 Allergic rhinitis, unspecified: Secondary | ICD-10-CM | POA: Diagnosis not present

## 2021-03-25 ENCOUNTER — Other Ambulatory Visit: Payer: Self-pay

## 2021-03-25 ENCOUNTER — Ambulatory Visit (INDEPENDENT_AMBULATORY_CARE_PROVIDER_SITE_OTHER): Payer: Federal, State, Local not specified - PPO

## 2021-03-25 DIAGNOSIS — J309 Allergic rhinitis, unspecified: Secondary | ICD-10-CM | POA: Diagnosis not present

## 2021-03-29 DIAGNOSIS — O09812 Supervision of pregnancy resulting from assisted reproductive technology, second trimester: Secondary | ICD-10-CM | POA: Diagnosis not present

## 2021-03-29 DIAGNOSIS — Z363 Encounter for antenatal screening for malformations: Secondary | ICD-10-CM | POA: Diagnosis not present

## 2021-03-29 DIAGNOSIS — Z3A19 19 weeks gestation of pregnancy: Secondary | ICD-10-CM | POA: Diagnosis not present

## 2021-03-29 DIAGNOSIS — O09519 Supervision of elderly primigravida, unspecified trimester: Secondary | ICD-10-CM | POA: Diagnosis not present

## 2021-03-31 ENCOUNTER — Other Ambulatory Visit: Payer: Self-pay

## 2021-04-02 ENCOUNTER — Ambulatory Visit: Payer: Federal, State, Local not specified - PPO | Admitting: Family Medicine

## 2021-04-08 ENCOUNTER — Ambulatory Visit (INDEPENDENT_AMBULATORY_CARE_PROVIDER_SITE_OTHER): Payer: Federal, State, Local not specified - PPO

## 2021-04-08 ENCOUNTER — Other Ambulatory Visit: Payer: Self-pay

## 2021-04-08 DIAGNOSIS — J309 Allergic rhinitis, unspecified: Secondary | ICD-10-CM | POA: Diagnosis not present

## 2021-04-15 ENCOUNTER — Ambulatory Visit: Payer: Federal, State, Local not specified - PPO | Admitting: Allergy

## 2021-04-19 ENCOUNTER — Encounter: Payer: Federal, State, Local not specified - PPO | Admitting: Family Medicine

## 2021-04-20 ENCOUNTER — Ambulatory Visit (INDEPENDENT_AMBULATORY_CARE_PROVIDER_SITE_OTHER): Payer: Federal, State, Local not specified - PPO

## 2021-04-20 ENCOUNTER — Other Ambulatory Visit: Payer: Self-pay

## 2021-04-20 DIAGNOSIS — J309 Allergic rhinitis, unspecified: Secondary | ICD-10-CM | POA: Diagnosis not present

## 2021-05-04 ENCOUNTER — Other Ambulatory Visit: Payer: Self-pay

## 2021-05-04 ENCOUNTER — Ambulatory Visit (INDEPENDENT_AMBULATORY_CARE_PROVIDER_SITE_OTHER): Payer: Federal, State, Local not specified - PPO

## 2021-05-04 DIAGNOSIS — J309 Allergic rhinitis, unspecified: Secondary | ICD-10-CM | POA: Diagnosis not present

## 2021-05-20 ENCOUNTER — Other Ambulatory Visit: Payer: Self-pay

## 2021-05-20 ENCOUNTER — Ambulatory Visit (INDEPENDENT_AMBULATORY_CARE_PROVIDER_SITE_OTHER): Payer: Federal, State, Local not specified - PPO

## 2021-05-20 DIAGNOSIS — J309 Allergic rhinitis, unspecified: Secondary | ICD-10-CM

## 2021-05-21 DIAGNOSIS — Z1389 Encounter for screening for other disorder: Secondary | ICD-10-CM | POA: Diagnosis not present

## 2021-05-21 DIAGNOSIS — Z23 Encounter for immunization: Secondary | ICD-10-CM | POA: Diagnosis not present

## 2021-05-21 DIAGNOSIS — Z3689 Encounter for other specified antenatal screening: Secondary | ICD-10-CM | POA: Diagnosis not present

## 2021-05-21 DIAGNOSIS — E038 Other specified hypothyroidism: Secondary | ICD-10-CM | POA: Diagnosis not present

## 2021-05-27 ENCOUNTER — Other Ambulatory Visit: Payer: Self-pay

## 2021-05-27 ENCOUNTER — Ambulatory Visit: Payer: Federal, State, Local not specified - PPO | Admitting: Family Medicine

## 2021-05-27 ENCOUNTER — Encounter: Payer: Self-pay | Admitting: Family Medicine

## 2021-05-27 VITALS — BP 138/72 | HR 88 | Temp 97.4°F | Ht 60.0 in | Wt 170.4 lb

## 2021-05-27 DIAGNOSIS — H1010 Acute atopic conjunctivitis, unspecified eye: Secondary | ICD-10-CM

## 2021-05-27 DIAGNOSIS — E039 Hypothyroidism, unspecified: Secondary | ICD-10-CM

## 2021-05-27 DIAGNOSIS — J302 Other seasonal allergic rhinitis: Secondary | ICD-10-CM

## 2021-05-27 DIAGNOSIS — G43709 Chronic migraine without aura, not intractable, without status migrainosus: Secondary | ICD-10-CM | POA: Diagnosis not present

## 2021-05-27 DIAGNOSIS — F411 Generalized anxiety disorder: Secondary | ICD-10-CM | POA: Diagnosis not present

## 2021-05-27 DIAGNOSIS — Z3A28 28 weeks gestation of pregnancy: Secondary | ICD-10-CM

## 2021-05-27 DIAGNOSIS — J3089 Other allergic rhinitis: Secondary | ICD-10-CM

## 2021-05-27 MED ORDER — SERTRALINE HCL 25 MG PO TABS: 25.0000 mg | ORAL_TABLET | Freq: Every day | ORAL | 3 refills | Status: DC

## 2021-05-27 NOTE — Progress Notes (Signed)
[facility] [address] [place] [address] Dept: [phone] Dept Fax: [phone]  Transfer of Care Office Visit  Subjective:    Patient ID: Lisa Townsend, female    DOB: 23-Sep-1976, 45 y.o..   MRN: [id number]  Chief Complaint  Patient presents with   Establish Care    [facility]- establish care.  No concerns.  No refills.     History of Present Illness:  Patient is in today to establish care. [name] is originally from [place], [place]. She has lived int he [place] for about 14 years. She is married (Lisa Townsend). She has 2 step-children. 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 She finds this work to be quite stressful. She denies any tobacco or drug use. She normally drinks alcohol 1-2 times a month, but none currently.  [name] is currently pregnant at 98 0/7 weeks (EDC= 08/19/21). Her pregnancy is the result of IVF with a donated oocyte. She notes she was unable to achieve pregnancy naturally. She also had a reduced number of viable eggs, so the donor was used. She did have a viable pregnancy with the first cycle. She is currently followed by an obstetrician for this.  [name] has a history of allergic rhinitis. She is currently managed on Zyrtec and receives immunotherapy for this with Dr. Rexene Alberts. [name] notes at one point, she was struggling with dyspnea dn hypoxia. She was prescribed a ICS and a SABA for this, but is using neither currently.  [name] has a history of recurrent migraine headache.s She has previously used Maxalt and Fioricet for management. She has used these sparingly during her pregnancy.  [name] has a history of hypothyroidism. She has not needed a dose adjustment to her meds during the pregnancy. She notes they drew blood for a TSH last week.  [name] has a history of generalized anxiety. She notes the stress from her job makes this difficult for her. She was previously  treated with diazepam, but has not taken this during the pregnancy. She notes she occasionally is taking Dramamine to help with anxiety symptoms. She notes she asked about treatment for GAD at her prenatal visits, but they recommended she discuss this with her PCP.  Past Medical History: Patient Active Problem List   Diagnosis Date Noted   Pregnant 01/12/2021   Seasonal and perennial allergic rhinoconjunctivitis 07/14/2020   Primary insomnia 08/02/2017   Constipation 10/13/2016   Sciatica, right 08/02/2016   H/O vitamin D deficiency 06/08/2016   Generalized anxiety disorder 05/21/2015   Chronic migraine without aura without status migrainosus, not intractable 05/05/2015   Hypothyroidism 01/06/2014   Past Surgical History:  Procedure Laterality Date   BREAST BIOPSY Left    NOSE SURGERY     Family History  Problem Relation Age of Onset   Urolithiasis Father    Cancer Mother    Hypertension Mother    Breast cancer Mother 68   Allergic rhinitis Brother    Breast cancer Maternal Grandmother        dx under 63   Stomach cancer Paternal Grandmother        dx in her 59s   Alcohol abuse Maternal Uncle    Prostate cancer Paternal Uncle        mid 47s   Asthma Neg Hx    Eczema Neg Hx    Urticaria Neg Hx    Outpatient Medications Prior to Visit  Medication Sig Dispense Refill   albuterol (VENTOLIN HFA) 108 (90 Base) MCG/ACT inhaler Inhale 2 puffs into the lungs every 4 (four) hours as needed for wheezing or shortness of breath (coughing). 8 g 2   aspirin EC 81 MG tablet Take 81 mg by mouth daily. Swallow whole.     budesonide-formoterol (SYMBICORT) 80-4.5 MCG/ACT inhaler Inhale 2 puffs into the lungs in the morning and at bedtime. with spacer and rinse mouth afterwards. 1 each 5   Butalbital-APAP-Caffeine 50-300-40 MG CAPS butalbital-acetaminophen-caffeine 50 mg-300 mg-40 mg capsule     cetirizine (ZYRTEC) 10 MG tablet Take 10 mg by mouth daily.     EPINEPHrine 0.3 mg/0.3 mL IJ SOAJ  injection Inject 0.3 mLs (0.3 mg total) into the muscle once as needed for up to 1 dose for anaphylaxis. 0.3 mL 2   levothyroxine (SYNTHROID) 75 MCG tablet TAKE 1 TABLET BY MOUTH DAILY BEFORE BREAKFAST. 90 tablet 1   Olopatadine HCl 0.2 % SOLN Apply 1 drop to eye daily as needed (itchy/watery eyes). 2.5 mL 5   Prenatal Vit-Fe Fumarate-FA (PRENATAL 1+1 PO) Prenatal     promethazine (PHENERGAN) 25 MG tablet promethazine 25 mg tablet  TAKE 1 TABLET BY MOUTH EVERY 12 HOURS AS NEEDED FOR NAUSEA     rizatriptan (MAXALT) 10 MG tablet TAKE 1 TABLET BY MOUTH AS NEEDED FOR MIGRAINE. MAY REPEAT IN 2 HOURS IF NEEDED 10 tablet 3   diazepam (VALIUM) 10 MG tablet Take 1 tablet (10 mg total) by mouth every 12 (twelve) hours as needed. for anxiety (Patient not taking: No sig reported) 60 tablet 0   estradiol (ESTRACE) 2 MG tablet Take by mouth. (Patient not taking: Reported on 05/27/2021)     nitrofurantoin (MACRODANTIN) 50 MG capsule Take as needed with intercourse (Patient not taking: No sig reported) 30 capsule 1   VIENVA 0.1-20 MG-MCG tablet Take by mouth. (Patient not taking: No sig reported)     No facility-administered medications prior to visit.   Allergies  Allergen Reactions   Claritin [Loratadine]     shakiness   Lexapro [Escitalopram] Nausea Only    Objective:   Today's Vitals   05/27/21 0908  BP: 138/72  Pulse: 88  Temp: (!) 97.4 F (36.3 C)  TempSrc: Temporal  SpO2: 99%  Weight: 170 lb 6.4 oz (77.3 kg)  Height: 5' (1.524 m)   Body mass index is 33.28 kg/m.   General: Well developed, well nourished. No acute distress. Psych: Alert and oriented. Normal mood and affect.  Health Maintenance Due  Topic Date Due   COLONOSCOPY (Pts 45-58yrs Insurance coverage will need to be confirmed)  Never done   MAMMOGRAM  01/13/2021     Assessment & Plan:   1. [redacted] weeks gestation of pregnancy [name] will continue a prenatal vitamin. She will continue to follow with OB.  2. Generalized  anxiety disorder We did discuss the risks associated with medications for treating anxiety in pregnancy. As she is nearing her 3rd trimester, I feel it would be safe to start a low dose of an SSRI. I will have her back in 6 weeks to reassess.  - sertraline (ZOLOFT) 25 MG tablet; Take 1 tablet (25 mg total) by mouth daily.  Dispense: 30 tablet; Refill: 3  3. Acquired hypothyroidism Apparently stable on Synthroid. OB will manage this for now, during pregnancy.  4. Seasonal and perennial allergic rhinoconjunctivitis On Zyrtec and immunotherapy. I agree with her holding off on use of inhalers currently.  5. Chronic  migraine without aura without status migrainosus, not intractable As headaches are infrequent,we will follow this for now.  Haydee Salter, MD

## 2021-05-28 DIAGNOSIS — O99810 Abnormal glucose complicating pregnancy: Secondary | ICD-10-CM | POA: Diagnosis not present

## 2021-06-01 ENCOUNTER — Other Ambulatory Visit: Payer: Self-pay

## 2021-06-01 ENCOUNTER — Ambulatory Visit (INDEPENDENT_AMBULATORY_CARE_PROVIDER_SITE_OTHER): Payer: Federal, State, Local not specified - PPO

## 2021-06-01 DIAGNOSIS — J309 Allergic rhinitis, unspecified: Secondary | ICD-10-CM | POA: Diagnosis not present

## 2021-06-15 ENCOUNTER — Ambulatory Visit (INDEPENDENT_AMBULATORY_CARE_PROVIDER_SITE_OTHER): Payer: Federal, State, Local not specified - PPO

## 2021-06-15 ENCOUNTER — Other Ambulatory Visit: Payer: Self-pay

## 2021-06-15 DIAGNOSIS — J309 Allergic rhinitis, unspecified: Secondary | ICD-10-CM

## 2021-06-15 NOTE — Progress Notes (Signed)
VIALS MADE. EXP 06-15-22 

## 2021-06-16 DIAGNOSIS — J302 Other seasonal allergic rhinitis: Secondary | ICD-10-CM | POA: Diagnosis not present

## 2021-06-17 ENCOUNTER — Ambulatory Visit: Payer: Federal, State, Local not specified - PPO | Admitting: Allergy

## 2021-06-22 DIAGNOSIS — O99810 Abnormal glucose complicating pregnancy: Secondary | ICD-10-CM | POA: Diagnosis not present

## 2021-07-01 ENCOUNTER — Ambulatory Visit (INDEPENDENT_AMBULATORY_CARE_PROVIDER_SITE_OTHER): Payer: Federal, State, Local not specified - PPO

## 2021-07-01 ENCOUNTER — Other Ambulatory Visit: Payer: Self-pay

## 2021-07-01 DIAGNOSIS — J309 Allergic rhinitis, unspecified: Secondary | ICD-10-CM

## 2021-07-07 ENCOUNTER — Ambulatory Visit: Payer: Federal, State, Local not specified - PPO | Admitting: Family Medicine

## 2021-07-07 ENCOUNTER — Other Ambulatory Visit: Payer: Self-pay

## 2021-07-07 ENCOUNTER — Encounter: Payer: Self-pay | Admitting: Family Medicine

## 2021-07-07 VITALS — BP 108/68 | HR 97 | Temp 97.9°F | Ht 60.0 in | Wt 175.6 lb

## 2021-07-07 DIAGNOSIS — Z3A33 33 weeks gestation of pregnancy: Secondary | ICD-10-CM | POA: Diagnosis not present

## 2021-07-07 DIAGNOSIS — F411 Generalized anxiety disorder: Secondary | ICD-10-CM

## 2021-07-07 NOTE — Progress Notes (Signed)
[facility] [address] [place] [address] Dept: [phone] Dept Fax: [phone]  Office Visit  Subjective:    Patient ID: Lisa Townsend, female    DOB: December 31, 1975, 45 y.o..   MRN: [id number]  Chief Complaint  Patient presents with   Follow-up    6wk f/u    History of Present Illness:  Patient is in today for reassessment of her anxiety. Ms. Lindau has a history of generalized anxiety.  She was previously treated with diazepam, but had not taken this during the pregnancy. She is currently 35 and 6/[redacted] weeks pregnant. Her obstetrician had recommended she work with her PCP regarding GAD treatment. At her last visit, we started her on Zoloft 25 mg daily. She notes her anxiety is improved. She has had no panic episodes and feels overall less anxiety in the last two weeks.  Her husband is with her today. He is scheduled for shoulder surgery in the next 10 days. He hopes to be recovered somewhat by the time of the delivery.  Past Medical History: Patient Active Problem List   Diagnosis Date Noted   Pregnant 01/12/2021   Seasonal and perennial allergic rhinoconjunctivitis 07/14/2020   Primary insomnia 08/02/2017   Constipation 10/13/2016   Sciatica, right 08/02/2016   H/O vitamin D deficiency 06/08/2016   Generalized anxiety disorder 05/21/2015   Chronic migraine without aura without status migrainosus, not intractable 05/05/2015   Hypothyroidism 01/06/2014   Past Surgical History:  Procedure Laterality Date   BREAST BIOPSY Left    NOSE SURGERY     Family History  Problem Relation Age of Onset   Urolithiasis Father    Cancer Mother    Hypertension Mother    Breast cancer Mother 23   Allergic rhinitis Brother    Breast cancer Maternal Grandmother        dx under 67   Stomach cancer Paternal Grandmother        dx in her 83s   Alcohol abuse Maternal Uncle    Prostate cancer Paternal Uncle        mid 35s    Asthma Neg Hx    Eczema Neg Hx    Urticaria Neg Hx    Outpatient Medications Prior to Visit  Medication Sig Dispense Refill   albuterol (VENTOLIN HFA) 108 (90 Base) MCG/ACT inhaler Inhale 2 puffs into the lungs every 4 (four) hours as needed for wheezing or shortness of breath (coughing). 8 g 2   aspirin EC 81 MG tablet Take 81 mg by mouth daily. Swallow whole.     budesonide-formoterol (SYMBICORT) 80-4.5 MCG/ACT inhaler Inhale 2 puffs into the lungs in the morning and at bedtime. with spacer and rinse mouth afterwards. 1 each 5   Butalbital-APAP-Caffeine 50-300-40 MG CAPS butalbital-acetaminophen-caffeine 50 mg-300 mg-40 mg capsule     cetirizine (ZYRTEC) 10 MG tablet Take 10 mg by mouth daily.     EPINEPHrine 0.3 mg/0.3 mL IJ SOAJ injection Inject 0.3 mLs (0.3 mg total) into the muscle once as needed for up to 1 dose for anaphylaxis. 0.3 mL 2   levothyroxine (SYNTHROID) 75 MCG tablet TAKE 1 TABLET BY MOUTH DAILY BEFORE BREAKFAST. 90 tablet 1   Olopatadine HCl 0.2 % SOLN Apply 1 drop to eye daily as needed (itchy/watery eyes). 2.5 mL 5   Prenatal Vit-Fe Fumarate-FA (PRENATAL 1+1 PO) Prenatal     promethazine (PHENERGAN) 25 MG tablet promethazine 25 mg tablet  TAKE 1 TABLET BY MOUTH EVERY 12  HOURS AS NEEDED FOR NAUSEA     rizatriptan (MAXALT) 10 MG tablet TAKE 1 TABLET BY MOUTH AS NEEDED FOR MIGRAINE. MAY REPEAT IN 2 HOURS IF NEEDED 10 tablet 3   sertraline (ZOLOFT) 25 MG tablet Take 1 tablet (25 mg total) by mouth daily. 30 tablet 3   valACYclovir (VALTREX) 1000 MG tablet Take by mouth.     No facility-administered medications prior to visit.   Allergies  Allergen Reactions   Claritin [Loratadine]     shakiness   Lexapro [Escitalopram] Nausea Only    Objective:   Today's Vitals   07/07/21 0932  BP: 108/68  Pulse: 97  Temp: 97.9 F (36.6 C)  TempSrc: Temporal  SpO2: 98%  Weight: 175 lb 9.6 oz (79.7 kg)  Height: 5' (1.524 m)   Body mass index is 34.29 kg/m.   General: Well  developed, well nourished. No acute distress. Psych: Alert and oriented. Normal mood and affect.  Health Maintenance Due  Topic Date Due   COLONOSCOPY (Pts 45-70yr Insurance coverage will need to be confirmed)  Never done   MAMMOGRAM  01/13/2021   INFLUENZA VACCINE  06/07/2021     Assessment & Plan:   1. Generalized anxiety disorder We will continue Zoloft through the delivery and postpartum. It is generally considered safe for breastfeeding infants. I recommend we reassess Ms. Dattilo's anxiety postpartum.  2. [redacted] weeks gestation of pregnancy We did discuss the advantages of breastfeeding for her infant. I encouraged her to do this for at least the first 2 months.  SHaydee Salter MD

## 2021-07-15 ENCOUNTER — Other Ambulatory Visit: Payer: Self-pay

## 2021-07-15 ENCOUNTER — Ambulatory Visit (INDEPENDENT_AMBULATORY_CARE_PROVIDER_SITE_OTHER): Payer: Federal, State, Local not specified - PPO

## 2021-07-15 DIAGNOSIS — J309 Allergic rhinitis, unspecified: Secondary | ICD-10-CM | POA: Diagnosis not present

## 2021-07-21 DIAGNOSIS — Z3685 Encounter for antenatal screening for Streptococcus B: Secondary | ICD-10-CM | POA: Diagnosis not present

## 2021-07-21 LAB — OB RESULTS CONSOLE GBS: GBS: POSITIVE

## 2021-07-27 ENCOUNTER — Other Ambulatory Visit: Payer: Self-pay

## 2021-07-27 ENCOUNTER — Ambulatory Visit (INDEPENDENT_AMBULATORY_CARE_PROVIDER_SITE_OTHER): Payer: Federal, State, Local not specified - PPO

## 2021-07-27 DIAGNOSIS — J309 Allergic rhinitis, unspecified: Secondary | ICD-10-CM

## 2021-07-27 DIAGNOSIS — O09513 Supervision of elderly primigravida, third trimester: Secondary | ICD-10-CM | POA: Diagnosis not present

## 2021-07-27 DIAGNOSIS — Z3A36 36 weeks gestation of pregnancy: Secondary | ICD-10-CM | POA: Diagnosis not present

## 2021-07-30 ENCOUNTER — Encounter (HOSPITAL_COMMUNITY): Payer: Self-pay | Admitting: *Deleted

## 2021-07-30 ENCOUNTER — Telehealth (HOSPITAL_COMMUNITY): Payer: Self-pay | Admitting: *Deleted

## 2021-07-30 NOTE — Telephone Encounter (Signed)
Preadmission screen  

## 2021-08-02 ENCOUNTER — Encounter (HOSPITAL_COMMUNITY): Payer: Self-pay | Admitting: *Deleted

## 2021-08-02 ENCOUNTER — Telehealth (HOSPITAL_COMMUNITY): Payer: Self-pay | Admitting: *Deleted

## 2021-08-02 NOTE — Telephone Encounter (Signed)
Preadmission screen  

## 2021-08-03 ENCOUNTER — Other Ambulatory Visit: Payer: Self-pay | Admitting: Obstetrics and Gynecology

## 2021-08-03 DIAGNOSIS — Z3A37 37 weeks gestation of pregnancy: Secondary | ICD-10-CM | POA: Diagnosis not present

## 2021-08-03 DIAGNOSIS — O09513 Supervision of elderly primigravida, third trimester: Secondary | ICD-10-CM | POA: Diagnosis not present

## 2021-08-03 DIAGNOSIS — O139 Gestational [pregnancy-induced] hypertension without significant proteinuria, unspecified trimester: Secondary | ICD-10-CM | POA: Diagnosis not present

## 2021-08-03 NOTE — H&P (Signed)
Lisa Townsend is a 45 y.o. female G1P0 at 51 6/7 weeks (EDD 08/19/21 by known [place] with IVF pregnancy using donor egg)  presenting for attempted version of a persistent breech presenting fetus and evaluation for possible developing gestational hypertension. Pt seen in office yesterday with BP 140-150/80's but normalizing at rest.  Labs WNL except protein:creat ratio noted elevated at 711mg .  No major HA, some swelling.  Prenatal care significant for: 1) In vitro fertilization  Donor egg IVF 2) Advanced maternal age gravida  Donor egg age 52 y/o--donor had carrier screen and pt states husband screened as well with no concerns  Baby ASA q day NIPT low risk 3) Hypothyroidism  Stable on 73mcg  Levels at intake WNL, repeat at 28 weeks WNL 4) Posttraumatic stress disorder  had therapist/stable 5) Genital herpes simplex  outbreak at 32 weeks - treated and started on suppression  6) Group B Streptococcus carrier  PCN in labor 7) Breech presentation  at 36+ weeks, desires version attempt 8) Blood glucose abnormal  failed 1 of 4 on 3hr at 28 weeks.Repeat 3 hour GTT WNL at 32 weeks 9) Possible gestational hypertension vs preeclampsia      BP mildly elevated x 1 yesterday  Prot:creat ratio 711mg     OB History     Gravida  1   Para  0   Term  0   Preterm  0   AB  0   Living  0      SAB  0   IAB  0   Ectopic  0   Multiple  0   Live Births  0          Past Medical History:  Diagnosis Date   Chronic constipation    Hypothyroidism    Migraine    Urticaria    Vitamin D deficiency    Past Surgical History:  Procedure Laterality Date   BREAST BIOPSY Left    NOSE SURGERY     Family History: family history includes Alcohol abuse in her maternal uncle; Allergic rhinitis in her brother; Breast cancer in her maternal grandmother; Breast cancer (age of onset: 46) in her mother; Cancer in her mother; Hypertension in her mother; Prostate cancer in her paternal uncle;  Stomach cancer in her paternal grandmother; Urolithiasis in her father. Social History:  reports that she has never smoked. She has never used smokeless tobacco. She reports that she does not currently use alcohol. She reports that she does not use drugs.     Maternal Diabetes: No Genetic Screening: Normal Maternal Ultrasounds/Referrals: Normal Fetal Ultrasounds or other Referrals:  None Maternal Substance Abuse:  No Significant Maternal Medications:  Meds include: Zoloft Significant Maternal Lab Results:  Group B Strep positive Other Comments:  None  Review of Systems  Constitutional:  Negative for fever.  Gastrointestinal:  Negative for abdominal pain.  Neurological:  Negative for headaches.  Maternal Medical History:  Contractions: Frequency: rare.   Perceived severity is mild.   Fetal activity: Perceived fetal activity is normal.   Prenatal complications: PIH.   Prenatal Complications - Diabetes: none.    There were no vitals taken for this visit. Maternal Exam:  Uterine Assessment: Contraction strength is mild.  Contraction frequency is irregular.  Abdomen: Patient reports no abdominal tenderness. Fetal presentation: breech Introitus: Normal vulva. Normal vagina.   Physical Exam Cardiovascular:     Rate and Rhythm: Normal rate and regular rhythm.  Pulmonary:     Effort: Pulmonary effort is  normal.  Abdominal:     Palpations: Abdomen is soft.  Genitourinary:    General: Normal vulva.  Musculoskeletal:        General: Swelling present.  Neurological:     General: No focal deficit present.     Mental Status: She is alert.  Psychiatric:        Mood and Affect: Mood normal.    Prenatal labs: ABO, Rh: O/Positive/-- (03/23 0000) Antibody: Negative (03/23 0000) Rubella: Immune (03/23 0000) RPR: Nonreactive (03/23 0000)  HBsAg: Negative (03/23 0000)  HIV: Non-reactive (03/23 0000)  GBS:   Positive NIPT negative  Assessment/Plan: Pt here for attempted version and  assessment for possible developing gestational hypertension.  Will recheck [place] labs and do serial BP.  Pt aware if abnormal will suggest proceed with delivery.  IOL if version successful and c-section if not.  Has been counseled on attempted version in detail with risks of fetal distress and need to proceed to c-section.     Logan Bores 08/03/2021, 9:12 PM

## 2021-08-03 NOTE — H&P (View-Only) (Signed)
Lisa Townsend is a 45 y.o. female G1P0 at 42 6/7 weeks (EDD 08/19/21 by known [facility] with IVF pregnancy using donor egg)  presenting for attempted version of a persistent breech presenting fetus and evaluation for possible developing gestational hypertension. Pt seen in office yesterday with BP 140-150/80's but normalizing at rest.  Labs WNL except protein:creat ratio noted elevated at 711mg .  No major HA, some swelling.  Prenatal care significant for: 1) In vitro fertilization  Donor egg IVF 2) Advanced maternal age gravida  Donor egg age 15 y/o--donor had carrier screen and pt states husband screened as well with no concerns  Baby ASA q day NIPT low risk 3) Hypothyroidism  Stable on 47mcg  Levels at intake WNL, repeat at 28 weeks WNL 4) Posttraumatic stress disorder  had therapist/stable 5) Genital herpes simplex  outbreak at 32 weeks - treated and started on suppression  6) Group B Streptococcus carrier  PCN in labor 7) Breech presentation  at 36+ weeks, desires version attempt 8) Blood glucose abnormal  failed 1 of 4 on 3hr at 28 weeks.Repeat 3 hour GTT WNL at 32 weeks 9) Possible gestational hypertension vs preeclampsia      BP mildly elevated x 1 yesterday  Prot:creat ratio 711mg     OB History     Gravida  1   Para  0   Term  0   Preterm  0   AB  0   Living  0      SAB  0   IAB  0   Ectopic  0   Multiple  0   Live Births  0          Past Medical History:  Diagnosis Date   Chronic constipation    Hypothyroidism    Migraine    Urticaria    Vitamin D deficiency    Past Surgical History:  Procedure Laterality Date   BREAST BIOPSY Left    NOSE SURGERY     Family History: family history includes Alcohol abuse in her maternal uncle; Allergic rhinitis in her brother; Breast cancer in her maternal grandmother; Breast cancer (age of onset: 32) in her mother; Cancer in her mother; Hypertension in her mother; Prostate cancer in her paternal uncle;  Stomach cancer in her paternal grandmother; Urolithiasis in her father. Social History:  reports that she has never smoked. She has never used smokeless tobacco. She reports that she does not currently use alcohol. She reports that she does not use drugs.     Maternal Diabetes: No Genetic Screening: Normal Maternal Ultrasounds/Referrals: Normal Fetal Ultrasounds or other Referrals:  None Maternal Substance Abuse:  No Significant Maternal Medications:  Meds include: Zoloft Significant Maternal Lab Results:  Group B Strep positive Other Comments:  None  Review of Systems  Constitutional:  Negative for fever.  Gastrointestinal:  Negative for abdominal pain.  Neurological:  Negative for headaches.  Maternal Medical History:  Contractions: Frequency: rare.   Perceived severity is mild.   Fetal activity: Perceived fetal activity is normal.   Prenatal complications: PIH.   Prenatal Complications - Diabetes: none.    There were no vitals taken for this visit. Maternal Exam:  Uterine Assessment: Contraction strength is mild.  Contraction frequency is irregular.  Abdomen: Patient reports no abdominal tenderness. Fetal presentation: breech Introitus: Normal vulva. Normal vagina.   Physical Exam Cardiovascular:     Rate and Rhythm: Normal rate and regular rhythm.  Pulmonary:     Effort: Pulmonary effort is  normal.  Abdominal:     Palpations: Abdomen is soft.  Genitourinary:    General: Normal vulva.  Musculoskeletal:        General: Swelling present.  Neurological:     General: No focal deficit present.     Mental Status: She is alert.  Psychiatric:        Mood and Affect: Mood normal.    Prenatal labs: ABO, Rh: O/Positive/-- (03/23 0000) Antibody: Negative (03/23 0000) Rubella: Immune (03/23 0000) RPR: Nonreactive (03/23 0000)  HBsAg: Negative (03/23 0000)  HIV: Non-reactive (03/23 0000)  GBS:   Positive NIPT negative  Assessment/Plan: Pt here for attempted version and  assessment for possible developing gestational hypertension.  Will recheck [place] labs and do serial BP.  Pt aware if abnormal will suggest proceed with delivery.  IOL if version successful and c-section if not.  Has been counseled on attempted version in detail with risks of fetal distress and need to proceed to c-section.     Logan Bores 08/03/2021, 9:12 PM

## 2021-08-04 ENCOUNTER — Other Ambulatory Visit (HOSPITAL_COMMUNITY): Payer: Self-pay

## 2021-08-04 ENCOUNTER — Observation Stay (HOSPITAL_COMMUNITY): Payer: Federal, State, Local not specified - PPO

## 2021-08-04 ENCOUNTER — Other Ambulatory Visit: Payer: Self-pay

## 2021-08-04 ENCOUNTER — Inpatient Hospital Stay (HOSPITAL_COMMUNITY)
Admission: AD | Admit: 2021-08-04 | Discharge: 2021-08-06 | DRG: 787 | Disposition: A | Discharge status: Home / Self Care | Payer: Federal, State, Local not specified - PPO | Attending: Obstetrics and Gynecology | Admitting: Obstetrics and Gynecology

## 2021-08-04 ENCOUNTER — Encounter (HOSPITAL_COMMUNITY): Payer: Self-pay | Admitting: Anesthesiology

## 2021-08-04 ENCOUNTER — Encounter (HOSPITAL_COMMUNITY): Payer: Self-pay | Admitting: Obstetrics and Gynecology

## 2021-08-04 ENCOUNTER — Inpatient Hospital Stay (HOSPITAL_COMMUNITY): Payer: Federal, State, Local not specified - PPO | Admitting: Anesthesiology

## 2021-08-04 ENCOUNTER — Encounter (HOSPITAL_COMMUNITY)
Admission: AD | Disposition: A | Discharge status: Home / Self Care | Payer: Self-pay | Source: Home / Self Care | Attending: Obstetrics and Gynecology

## 2021-08-04 DIAGNOSIS — O99824 Streptococcus B carrier state complicating childbirth: Secondary | ICD-10-CM | POA: Diagnosis not present

## 2021-08-04 DIAGNOSIS — F4310 Post-traumatic stress disorder, unspecified: Secondary | ICD-10-CM | POA: Diagnosis present

## 2021-08-04 DIAGNOSIS — O9832 Other infections with a predominantly sexual mode of transmission complicating childbirth: Secondary | ICD-10-CM | POA: Diagnosis present

## 2021-08-04 DIAGNOSIS — O9081 Anemia of the puerperium: Secondary | ICD-10-CM | POA: Diagnosis not present

## 2021-08-04 DIAGNOSIS — O321XX1 Maternal care for breech presentation, fetus 1: Secondary | ICD-10-CM

## 2021-08-04 DIAGNOSIS — O99344 Other mental disorders complicating childbirth: Secondary | ICD-10-CM | POA: Diagnosis present

## 2021-08-04 DIAGNOSIS — E039 Hypothyroidism, unspecified: Secondary | ICD-10-CM | POA: Diagnosis present

## 2021-08-04 DIAGNOSIS — D62 Acute posthemorrhagic anemia: Secondary | ICD-10-CM | POA: Diagnosis not present

## 2021-08-04 DIAGNOSIS — Z3A37 37 weeks gestation of pregnancy: Secondary | ICD-10-CM

## 2021-08-04 DIAGNOSIS — O321XX0 Maternal care for breech presentation, not applicable or unspecified: Secondary | ICD-10-CM | POA: Diagnosis not present

## 2021-08-04 DIAGNOSIS — O99284 Endocrine, nutritional and metabolic diseases complicating childbirth: Secondary | ICD-10-CM | POA: Diagnosis not present

## 2021-08-04 DIAGNOSIS — A6000 Herpesviral infection of urogenital system, unspecified: Secondary | ICD-10-CM | POA: Diagnosis present

## 2021-08-04 DIAGNOSIS — Z3A00 Weeks of gestation of pregnancy not specified: Secondary | ICD-10-CM | POA: Diagnosis not present

## 2021-08-04 DIAGNOSIS — Z20822 Contact with and (suspected) exposure to covid-19: Secondary | ICD-10-CM | POA: Diagnosis not present

## 2021-08-04 DIAGNOSIS — O134 Gestational [pregnancy-induced] hypertension without significant proteinuria, complicating childbirth: Secondary | ICD-10-CM | POA: Diagnosis present

## 2021-08-04 DIAGNOSIS — O133 Gestational [pregnancy-induced] hypertension without significant proteinuria, third trimester: Secondary | ICD-10-CM | POA: Diagnosis not present

## 2021-08-04 LAB — COMPREHENSIVE METABOLIC PANEL WITH GFR
ALT: 20 U/L (ref 0–44)
AST: 29 U/L (ref 15–41)
Albumin: 2.7 g/dL — ABNORMAL LOW (ref 3.5–5.0)
Alkaline Phosphatase: 172 U/L — ABNORMAL HIGH (ref 38–126)
Anion gap: 8 (ref 5–15)
BUN: 12 mg/dL (ref 6–20)
CO2: 19 mmol/L — ABNORMAL LOW (ref 22–32)
Calcium: 8.5 mg/dL — ABNORMAL LOW (ref 8.9–10.3)
Chloride: 110 mmol/L (ref 98–111)
Creatinine, Ser: 0.91 mg/dL (ref 0.44–1.00)
GFR, Estimated: >60 mL/min
Glucose, Bld: 75 mg/dL (ref 70–99)
Potassium: 3.6 mmol/L (ref 3.5–5.1)
Sodium: 137 mmol/L (ref 135–145)
Total Bilirubin: 0.9 mg/dL (ref 0.3–1.2)
Total Protein: 5.4 g/dL — ABNORMAL LOW (ref 6.5–8.1)

## 2021-08-04 LAB — CBC
HCT: 32.5 % — ABNORMAL LOW (ref 36.0–46.0)
Hemoglobin: 11.3 g/dL — ABNORMAL LOW (ref 12.0–15.0)
MCH: 33.3 pg (ref 26.0–34.0)
MCHC: 34.8 g/dL (ref 30.0–36.0)
MCV: 95.9 fL (ref 80.0–100.0)
Platelets: 154 10*3/uL (ref 150–400)
RBC: 3.39 MIL/uL — ABNORMAL LOW (ref 3.87–5.11)
RDW: 13 % (ref 11.5–15.5)
WBC: 8 10*3/uL (ref 4.0–10.5)
nRBC: 0 % (ref 0.0–0.2)

## 2021-08-04 LAB — TYPE AND SCREEN
ABO/RH(D): O POS
Antibody Screen: NEGATIVE

## 2021-08-04 LAB — RESP PANEL BY RT-PCR (FLU A&B, COVID) ARPGX2
Influenza A by PCR: NEGATIVE
Influenza B by PCR: NEGATIVE
SARS Coronavirus 2 by RT PCR: NEGATIVE

## 2021-08-04 LAB — PROTEIN / CREATININE RATIO, URINE
Creatinine, Urine: 191.78 mg/dL
Protein Creatinine Ratio: 0.27 mg/mg{Cre} — ABNORMAL HIGH (ref 0.00–0.15)
Total Protein, Urine: 52 mg/dL

## 2021-08-04 LAB — SYPHILIS: RPR W/REFLEX TO RPR TITER AND TREPONEMAL ANTIBODIES, TRADITIONAL SCREENING AND DIAGNOSIS ALGORITHM: RPR Ser Ql: NONREACTIVE

## 2021-08-04 SURGERY — Surgical Case
Anesthesia: Spinal

## 2021-08-04 MED ORDER — PHENYLEPHRINE HCL-NACL 20-0.9 MG/250ML-% IV SOLN
INTRAVENOUS | Status: AC
  Filled 2021-08-04: qty 250

## 2021-08-04 MED ORDER — SODIUM CHLORIDE 0.9 % IR SOLN
Status: DC | PRN
  Administered 2021-08-04: 1

## 2021-08-04 MED ORDER — SIMETHICONE 80 MG PO CHEW
80.0000 mg | CHEWABLE_TABLET | Freq: Three times a day (TID) | ORAL | Status: DC
  Administered 2021-08-05 – 2021-08-06 (×4): 80 mg via ORAL
  Filled 2021-08-04 (×4): qty 1

## 2021-08-04 MED ORDER — ACETAMINOPHEN 500 MG PO TABS
1000.0000 mg | ORAL_TABLET | Freq: Four times a day (QID) | ORAL | Status: DC
  Administered 2021-08-04 – 2021-08-06 (×6): 1000 mg via ORAL
  Filled 2021-08-04 (×6): qty 2

## 2021-08-04 MED ORDER — SODIUM CHLORIDE 0.9% FLUSH: 3.0000 mL | INTRAVENOUS | Status: DC | PRN

## 2021-08-04 MED ORDER — DIPHENHYDRAMINE HCL 50 MG/ML IJ SOLN: 12.5000 mg | INTRAMUSCULAR | Status: DC | PRN

## 2021-08-04 MED ORDER — NALOXONE HCL 0.4 MG/ML IJ SOLN: 0.4000 mg | INTRAMUSCULAR | Status: DC | PRN

## 2021-08-04 MED ORDER — SENNOSIDES-DOCUSATE SODIUM 8.6-50 MG PO TABS
2.0000 | ORAL_TABLET | Freq: Every day | ORAL | Status: DC
  Administered 2021-08-05 – 2021-08-06 (×2): 2 via ORAL
  Filled 2021-08-04 (×2): qty 2

## 2021-08-04 MED ORDER — STERILE WATER FOR IRRIGATION IR SOLN
Status: DC | PRN
  Administered 2021-08-04: 1000 mL

## 2021-08-04 MED ORDER — CEFAZOLIN SODIUM-DEXTROSE 2-4 GM/100ML-% IV SOLN
INTRAVENOUS | Status: AC
  Filled 2021-08-04: qty 100

## 2021-08-04 MED ORDER — LIDOCAINE HCL (PF) 1 % IJ SOLN
INTRAMUSCULAR | Status: AC
  Filled 2021-08-04: qty 5

## 2021-08-04 MED ORDER — FENTANYL CITRATE (PF) 100 MCG/2ML IJ SOLN
INTRAMUSCULAR | Status: AC
  Filled 2021-08-04: qty 2

## 2021-08-04 MED ORDER — ACETAMINOPHEN 500 MG PO TABS
1000.0000 mg | ORAL_TABLET | Freq: Once | ORAL | Status: AC
  Administered 2021-08-04: 1000 mg via ORAL

## 2021-08-04 MED ORDER — SOD CITRATE-CITRIC ACID 500-334 MG/5ML PO SOLN: 30.0000 mL | ORAL | Status: DC | PRN

## 2021-08-04 MED ORDER — NALOXONE HCL 4 MG/10ML IJ SOLN
1.0000 ug/kg/h | INTRAVENOUS | Status: DC | PRN
  Filled 2021-08-04: qty 5

## 2021-08-04 MED ORDER — OXYTOCIN-SODIUM CHLORIDE 30-0.9 UT/500ML-% IV SOLN
INTRAVENOUS | Status: DC | PRN
  Administered 2021-08-04: 30 [IU] via INTRAVENOUS

## 2021-08-04 MED ORDER — OXYCODONE-ACETAMINOPHEN 5-325 MG PO TABS: 1.0000 | ORAL_TABLET | ORAL | Status: DC | PRN

## 2021-08-04 MED ORDER — KETOROLAC TROMETHAMINE 30 MG/ML IJ SOLN
INTRAMUSCULAR | Status: AC
  Filled 2021-08-04: qty 1

## 2021-08-04 MED ORDER — ONDANSETRON HCL 4 MG/2ML IJ SOLN: 4.0000 mg | Freq: Three times a day (TID) | INTRAMUSCULAR | Status: DC | PRN

## 2021-08-04 MED ORDER — ONDANSETRON HCL 4 MG/2ML IJ SOLN
INTRAMUSCULAR | Status: AC
  Filled 2021-08-04: qty 2

## 2021-08-04 MED ORDER — MORPHINE SULFATE (PF) 0.5 MG/ML IJ SOLN
INTRAMUSCULAR | Status: AC
  Filled 2021-08-04: qty 10

## 2021-08-04 MED ORDER — ACETAMINOPHEN 500 MG PO TABS
ORAL_TABLET | ORAL | Status: AC
  Filled 2021-08-04: qty 2

## 2021-08-04 MED ORDER — MORPHINE SULFATE (PF) 0.5 MG/ML IJ SOLN
INTRAMUSCULAR | Status: DC | PRN
  Administered 2021-08-04: 150 ug via INTRATHECAL

## 2021-08-04 MED ORDER — DIBUCAINE (PERIANAL) 1 % EX OINT: 1.0000 "application " | TOPICAL_OINTMENT | CUTANEOUS | Status: DC | PRN

## 2021-08-04 MED ORDER — SOD CITRATE-CITRIC ACID 500-334 MG/5ML PO SOLN
ORAL | Status: AC
  Filled 2021-08-04: qty 30

## 2021-08-04 MED ORDER — MENTHOL 3 MG MT LOZG: 1.0000 | LOZENGE | OROMUCOSAL | Status: DC | PRN

## 2021-08-04 MED ORDER — NALBUPHINE HCL 10 MG/ML IJ SOLN
5.0000 mg | Freq: Once | INTRAMUSCULAR | Status: DC | PRN
Start: 2021-08-04 — End: 2021-08-06

## 2021-08-04 MED ORDER — KETOROLAC TROMETHAMINE 30 MG/ML IJ SOLN
30.0000 mg | Freq: Four times a day (QID) | INTRAMUSCULAR | Status: AC
  Administered 2021-08-04 – 2021-08-05 (×2): 30 mg via INTRAVENOUS
  Filled 2021-08-04: qty 1

## 2021-08-04 MED ORDER — CEFAZOLIN SODIUM-DEXTROSE 2-3 GM-%(50ML) IV SOLR
INTRAVENOUS | Status: DC | PRN
  Administered 2021-08-04: 2 g via INTRAVENOUS

## 2021-08-04 MED ORDER — OXYCODONE HCL 5 MG PO TABS
5.0000 mg | ORAL_TABLET | ORAL | Status: DC | PRN
  Administered 2021-08-04: 10 mg via ORAL
  Filled 2021-08-04: qty 2

## 2021-08-04 MED ORDER — DIPHENHYDRAMINE HCL 25 MG PO CAPS: 25.0000 mg | ORAL_CAPSULE | Freq: Four times a day (QID) | ORAL | Status: DC | PRN

## 2021-08-04 MED ORDER — IBUPROFEN 600 MG PO TABS
600.0000 mg | ORAL_TABLET | Freq: Four times a day (QID) | ORAL | Status: DC
  Administered 2021-08-05 – 2021-08-06 (×5): 600 mg via ORAL
  Filled 2021-08-04 (×5): qty 1

## 2021-08-04 MED ORDER — NALBUPHINE HCL 10 MG/ML IJ SOLN: 5.0000 mg | Freq: Once | INTRAMUSCULAR | Status: DC | PRN

## 2021-08-04 MED ORDER — SCOPOLAMINE 1 MG/3DAYS TD PT72: 1.0000 | MEDICATED_PATCH | Freq: Once | TRANSDERMAL | Status: DC

## 2021-08-04 MED ORDER — WITCH HAZEL-GLYCERIN EX PADS: 1.0000 "application " | MEDICATED_PAD | CUTANEOUS | Status: DC | PRN

## 2021-08-04 MED ORDER — COCONUT OIL OIL: 1.0000 "application " | TOPICAL_OIL | Status: DC | PRN

## 2021-08-04 MED ORDER — LIDOCAINE HCL (PF) 1 % IJ SOLN
30.0000 mL | INTRAMUSCULAR | Status: DC | PRN
  Filled 2021-08-04: qty 30

## 2021-08-04 MED ORDER — LEVOTHYROXINE SODIUM 75 MCG PO TABS
75.0000 ug | ORAL_TABLET | Freq: Every day | ORAL | Status: DC
  Administered 2021-08-05 – 2021-08-06 (×2): 75 ug via ORAL
  Filled 2021-08-04 (×2): qty 1

## 2021-08-04 MED ORDER — OXYTOCIN-SODIUM CHLORIDE 30-0.9 UT/500ML-% IV SOLN
INTRAVENOUS | Status: AC
  Filled 2021-08-04: qty 500

## 2021-08-04 MED ORDER — NALBUPHINE HCL 10 MG/ML IJ SOLN: 5.0000 mg | INTRAMUSCULAR | Status: DC | PRN

## 2021-08-04 MED ORDER — SCOPOLAMINE 1 MG/3DAYS TD PT72
MEDICATED_PATCH | TRANSDERMAL | Status: AC
  Filled 2021-08-04: qty 1

## 2021-08-04 MED ORDER — BUPIVACAINE IN DEXTROSE 0.75-8.25 % IT SOLN
INTRATHECAL | Status: DC | PRN
  Administered 2021-08-04: 1.4 mL via INTRATHECAL

## 2021-08-04 MED ORDER — ONDANSETRON HCL 4 MG/2ML IJ SOLN
INTRAMUSCULAR | Status: DC | PRN
Start: 2021-08-04 — End: 2021-08-04
  Administered 2021-08-04: 4 mg via INTRAVENOUS

## 2021-08-04 MED ORDER — LACTATED RINGERS IV SOLN: 500.0000 mL | INTRAVENOUS | Status: DC | PRN

## 2021-08-04 MED ORDER — OXYTOCIN-SODIUM CHLORIDE 30-0.9 UT/500ML-% IV SOLN: 2.5000 [IU]/h | INTRAVENOUS | Status: AC

## 2021-08-04 MED ORDER — PRENATAL MULTIVITAMIN CH
1.0000 | ORAL_TABLET | Freq: Every day | ORAL | Status: DC
  Administered 2021-08-05 – 2021-08-06 (×2): 1 via ORAL
  Filled 2021-08-04 (×2): qty 1

## 2021-08-04 MED ORDER — MEPERIDINE HCL 25 MG/ML IJ SOLN: 6.2500 mg | INTRAMUSCULAR | Status: DC | PRN

## 2021-08-04 MED ORDER — DEXAMETHASONE SODIUM PHOSPHATE 4 MG/ML IJ SOLN
INTRAMUSCULAR | Status: DC | PRN
  Administered 2021-08-04: 10 mg via INTRAVENOUS

## 2021-08-04 MED ORDER — SIMETHICONE 80 MG PO CHEW: 80.0000 mg | CHEWABLE_TABLET | ORAL | Status: DC | PRN

## 2021-08-04 MED ORDER — ZOLPIDEM TARTRATE 5 MG PO TABS: 5.0000 mg | ORAL_TABLET | Freq: Every evening | ORAL | Status: DC | PRN

## 2021-08-04 MED ORDER — FENTANYL CITRATE (PF) 100 MCG/2ML IJ SOLN
INTRAMUSCULAR | Status: DC | PRN
  Administered 2021-08-04: 15 ug via INTRATHECAL

## 2021-08-04 MED ORDER — FENTANYL CITRATE (PF) 100 MCG/2ML IJ SOLN: 25.0000 ug | INTRAMUSCULAR | Status: DC | PRN

## 2021-08-04 MED ORDER — TERBUTALINE SULFATE 1 MG/ML IJ SOLN
0.2500 mg | Freq: Once | INTRAMUSCULAR | Status: AC
  Administered 2021-08-04: 0.25 mg via SUBCUTANEOUS

## 2021-08-04 MED ORDER — DIPHENHYDRAMINE HCL 25 MG PO CAPS: 25.0000 mg | ORAL_CAPSULE | ORAL | Status: DC | PRN

## 2021-08-04 MED ORDER — PHENYLEPHRINE HCL-NACL 20-0.9 MG/250ML-% IV SOLN
INTRAVENOUS | Status: DC | PRN
  Administered 2021-08-04: 30 ug/min via INTRAVENOUS

## 2021-08-04 MED ORDER — OXYCODONE-ACETAMINOPHEN 5-325 MG PO TABS: 2.0000 | ORAL_TABLET | ORAL | Status: DC | PRN

## 2021-08-04 MED ORDER — ACETAMINOPHEN 325 MG PO TABS: 650.0000 mg | ORAL_TABLET | ORAL | Status: DC | PRN

## 2021-08-04 MED ORDER — TETANUS-DIPHTH-ACELL PERTUSSIS 5-2.5-18.5 LF-MCG/0.5 IM SUSY
0.5000 mL | PREFILLED_SYRINGE | Freq: Once | INTRAMUSCULAR | Status: DC
Start: 2021-08-05 — End: 2021-08-06

## 2021-08-04 MED ORDER — KETOROLAC TROMETHAMINE 30 MG/ML IJ SOLN
30.0000 mg | Freq: Once | INTRAMUSCULAR | Status: AC | PRN
  Administered 2021-08-04: 30 mg via INTRAVENOUS

## 2021-08-04 MED ORDER — PHENYLEPHRINE 40 MCG/ML (10ML) SYRINGE FOR IV PUSH (FOR BLOOD PRESSURE SUPPORT)
PREFILLED_SYRINGE | INTRAVENOUS | Status: AC
  Filled 2021-08-04: qty 10

## 2021-08-04 MED ORDER — METOCLOPRAMIDE HCL 5 MG/ML IJ SOLN
INTRAMUSCULAR | Status: AC
  Filled 2021-08-04: qty 2

## 2021-08-04 MED ORDER — LACTATED RINGERS IV SOLN: INTRAVENOUS | Status: DC

## 2021-08-04 MED ORDER — DEXAMETHASONE SODIUM PHOSPHATE 4 MG/ML IJ SOLN
INTRAMUSCULAR | Status: AC
  Filled 2021-08-04: qty 2

## 2021-08-04 MED ORDER — PHENYLEPHRINE HCL (PRESSORS) 10 MG/ML IV SOLN
INTRAVENOUS | Status: DC | PRN
  Administered 2021-08-04: 40 ug via INTRAVENOUS

## 2021-08-04 MED ORDER — CEFAZOLIN SODIUM-DEXTROSE 2-4 GM/100ML-% IV SOLN: 2.0000 g | Freq: Once | INTRAVENOUS | Status: DC

## 2021-08-04 MED ORDER — ONDANSETRON HCL 4 MG/2ML IJ SOLN: 4.0000 mg | Freq: Four times a day (QID) | INTRAMUSCULAR | Status: DC | PRN

## 2021-08-04 MED ORDER — ALBUTEROL SULFATE (2.5 MG/3ML) 0.083% IN NEBU: 3.0000 mL | INHALATION_SOLUTION | RESPIRATORY_TRACT | Status: DC | PRN

## 2021-08-04 SURGICAL SUPPLY — 37 items
ADH SKN CLS APL DERMABOND .7 (GAUZE/BANDAGES/DRESSINGS) ×1
CHLORAPREP W/TINT 26ML (MISCELLANEOUS) ×2 IMPLANT
CLAMP CORD UMBIL (MISCELLANEOUS) IMPLANT
CLOTH BEACON ORANGE TIMEOUT ST (SAFETY) ×2 IMPLANT
DERMABOND ADVANCED (GAUZE/BANDAGES/DRESSINGS) ×1
DERMABOND ADVANCED .7 DNX12 (GAUZE/BANDAGES/DRESSINGS) ×1 IMPLANT
DRSG OPSITE POSTOP 4X10 (GAUZE/BANDAGES/DRESSINGS) ×2 IMPLANT
ELECT REM PT RETURN 9FT ADLT (ELECTROSURGICAL) ×2
ELECTRODE REM PT RTRN 9FT ADLT (ELECTROSURGICAL) ×1 IMPLANT
EXTRACTOR VACUUM BELL STYLE (SUCTIONS) IMPLANT
GAUZE SPONGE 4X4 12PLY STRL LF (GAUZE/BANDAGES/DRESSINGS) IMPLANT
GLOVE BIO SURGEON STRL SZ 6 (GLOVE) ×2 IMPLANT
GLOVE BIOGEL PI IND STRL 6.5 (GLOVE) ×1 IMPLANT
GLOVE BIOGEL PI IND STRL 7.0 (GLOVE) ×2 IMPLANT
GLOVE BIOGEL PI INDICATOR 6.5 (GLOVE) ×1
GLOVE BIOGEL PI INDICATOR 7.0 (GLOVE) ×2
GOWN STRL REUS W/TWL LRG LVL3 (GOWN DISPOSABLE) ×4 IMPLANT
KIT ABG SYR 3ML LUER SLIP (SYRINGE) IMPLANT
NEEDLE HYPO 25X5/8 SAFETYGLIDE (NEEDLE) IMPLANT
NS IRRIG 1000ML POUR BTL (IV SOLUTION) ×2 IMPLANT
PACK C SECTION WH (CUSTOM PROCEDURE TRAY) ×2 IMPLANT
PAD ABD 8X10 STRL (GAUZE/BANDAGES/DRESSINGS) IMPLANT
PAD OB MATERNITY 4.3X12.25 (PERSONAL CARE ITEMS) ×2 IMPLANT
PENCIL SMOKE EVAC W/HOLSTER (ELECTROSURGICAL) ×2 IMPLANT
RTRCTR C-SECT PINK 25CM LRG (MISCELLANEOUS) IMPLANT
SUT PDS AB 0 CTX 36 PDP370T (SUTURE) IMPLANT
SUT PLAIN 2 0 (SUTURE)
SUT PLAIN ABS 2-0 CT1 27XMFL (SUTURE) IMPLANT
SUT VIC AB 0 CT1 36 (SUTURE) ×6 IMPLANT
SUT VIC AB 2-0 CT1 27 (SUTURE) ×2
SUT VIC AB 2-0 CT1 TAPERPNT 27 (SUTURE) ×1 IMPLANT
SUT VIC AB 3-0 SH 27 (SUTURE)
SUT VIC AB 3-0 SH 27X BRD (SUTURE) IMPLANT
SUT VIC AB 4-0 KS 27 (SUTURE) ×2 IMPLANT
TOWEL OR 17X24 6PK STRL BLUE (TOWEL DISPOSABLE) ×2 IMPLANT
TRAY FOLEY W/BAG SLVR 14FR LF (SET/KITS/TRAYS/PACK) ×2 IMPLANT
WATER STERILE IRR 1000ML POUR (IV SOLUTION) ×2 IMPLANT

## 2021-08-04 NOTE — Anesthesia Procedure Notes (Addendum)
Spinal  Patient location during procedure: OR Start time: 08/04/2021 12:12 PM End time: 08/04/2021 12:13 PM Reason for block: surgical anesthesia Staffing Performed: anesthesiologist  Anesthesiologist: Darral Dash, DO Preanesthetic Checklist Completed: patient identified, IV checked, site marked, risks and benefits discussed, surgical consent, monitors and equipment checked, pre-op evaluation and timeout performed Spinal Block Patient position: sitting Prep: DuraPrep Patient monitoring: heart rate, cardiac monitor, continuous pulse ox and blood pressure Approach: midline Location: L3-4 Injection technique: single-shot Needle Needle type: Sprotte  Needle gauge: 24 G Needle length: 9 cm Assessment Sensory level: T4 Events: CSF return Additional Notes Patient identified. Risks/Benefits/Options discussed with patient including but not limited to bleeding, infection, nerve damage, paralysis, failed block, incomplete pain control, headache, blood pressure changes, nausea, vomiting, reactions to medications, itching and postpartum back pain. Confirmed with bedside nurse the patient's most recent platelet count. Confirmed with patient that they are not currently taking any anticoagulation, have any bleeding history or any family history of bleeding disorders. Patient expressed understanding and wished to proceed. All questions were answered. Sterile technique was used throughout the entire procedure. Please see nursing notes for vital signs. Warning signs of high block given to the patient including shortness of breath, tingling/numbness in hands, complete motor block, or any concerning symptoms with instructions to call for help. Patient was given instructions on fall risk and not to get out of bed. All questions and concerns addressed with instructions to call with any issues or inadequate analgesia.

## 2021-08-04 NOTE — Lactation Note (Signed)
This note was copied from a baby's chart. Lactation Consultation Note  Patient Name: Lisa Townsend [id number] Date: 08/04/2021 Reason for consult: Initial assessment;Mother's request;Difficult latch;Primapara;1st time breastfeeding;Maternal endocrine disorder Age:46 hours LC assisted with different latching positions. Infant has heart shaped tongue and lingual attachment, with suck training he can extend tongue felt base of my finger.   LC used 20 NS to assist with latching given inverted nipples. Parents able to see transfer of colostrum through NS but decided to supplement with Formula until supply increases.   Plan 1. To feed based on cues 8-12x 24 hr period. Mom to try latching with help of 20 NS. Mom call for latch assistance if needed.  2. Dad to supplement with EBM first followed by formula with pace bottle feeding and BF supplementation guide provided.  3. Parents bring in personal electric pump from home. For now understand using 20 NS barrier to let down use manual pump q 3hrs for 10 15 min after latching.  4. I and O sheet reviewed.  5. [place] brochure of inpatient and outpatient services reviewed.    All questions answered at the end of the visit.  Mom breast shells to wear not pumping sleeping or nursing. RN, Rodney Cruise aware of plan listed above.  Maternal Data Has patient been taught Hand Expression?: Yes  Feeding Mother's Current Feeding Choice: Breast Milk and Formula  LATCH Score Latch: Repeated attempts needed to sustain latch, nipple held in mouth throughout feeding, stimulation needed to elicit sucking reflex.  Audible Swallowing: Spontaneous and intermittent  Type of Nipple: Inverted  Comfort (Breast/Nipple): Soft / non-tender  Hold (Positioning): Assistance needed to correctly position infant at breast and maintain latch.  LATCH Score: 6   Lactation Tools Discussed/Used Tools: Nipple [place] Fuel;Shells Nipple shield size:  20  Interventions Interventions: Breast feeding basics reviewed;Assisted with latch;Skin to skin;Breast massage;Hand express;Breast compression;Adjust position;Support pillows;Position options;Expressed milk;Shells;Hand pump;Education;Pace feeding;LC Magazine features editor;Infant Driven Feeding Algorithm education  Discharge Pump: Personal (Parents to bring in personal electric pump) WIC Program: No  Consult Status Consult Status: Follow-up Date: 08/05/21 Follow-up type: In-patient    Lisa Townsend  Lisa Townsend 08/04/2021, 5:32 PM

## 2021-08-04 NOTE — Procedures (Signed)
Attempted ECV note  Pt arrived this AM c/o HA and some epigastric pain intermittently.   BP elevated at 140-150/80's    FHR category 1  [place] confirmed baby still breech with head to maternal right.  Attempted forward roll x 2 with no real progress lifting baby out of pelvis.  FHR fine throughout attempt.  D/w pt and husband I feel delivery indicated given increased BP and now HA with increasing protein in urine.  She agrees to proceed with c-section when OR available given failed version attempt.  Risks and benefits of c-section d/w pt including bleeding infection and possible damage to bowel and bladder.  We discussed  that team would assess baby given 37+ weeks in OR to be sure no issues with breathing.  Pt ready to proceed when able.  Labs pending.  Plts back and normal.  No severe range pressures so will treat HA and see if improves.  Dr. Wilhelmenia Blase covering L&D today and will take over care and perform c-section.

## 2021-08-04 NOTE — Op Note (Signed)
C-Section Operative Note  Date: 08/04/21  Preoperative Diagnosis: IUP @ 37 6/7, GHTN Postoperative Diagnosis: Same as above Indication: Fetal malpresentation Procedure: PLTCS Findings: Viable female infant with APGARS of 8 and 9 at 1 and 5 minutes, respectively. Normal appearing uterus, bilateral fallopian tubes and ovaries. Specimens: placenta to pathology EBL 336cc IVF [place]  Patient Course: Patient admitted for ECV this morning given frank breech presentation. Unable to vert, amenable to moving forward with PLTCS.   Consent:  R/B/A of cesarean section discussed with patient. Alternative would be vaginal delivery which would mean shorter postpartum stay and decreased risk of bleeding. Risks of section include infection of the uterus, pelvic organs, or skin, inadvertent injury to internal organs, such as bowel or bladder. If there is major injury, extensive surgery may be required. If injury is minor, it may be treated with relative ease. Discussed possibility of excessive blood loss and transfusion. If bleeding cannot be controlled using medical or minor surgical methods, a cesarean hysterectomy may be performed which would mean no future fertility. Patient accepts the possibility of blood transfusion, if necessary. Patient understands and agrees to move forward with section.   Operative Procedure: Patient was taken to the operating room where epidural anesthesia was found to be adequate by Allis clamp test. She was prepped and draped in the normal sterile fashion in the dorsal supine position with a leftward tilt. An appropriate time out was performed. A Pfannenstiel skin incision was then made with the scalpel and carried through to the underlying layer of fascia by sharp dissection and Bovie cautery. The fascia was nicked in the midline and the incision was extended laterally with Mayo scissors. The superior aspect of the incision was grasped Coker clamps and dissected off the  underlying rectus muscles. In a similar fashion the inferior aspect was dissected off the rectus muscles. Rectus muscles were separated in the midline and the peritoneal cavity entered bluntly. The peritoneal incision was then extended both superiorly and inferiorly with careful attention to avoid both bowel and bladder. The Alexis self-retaining wound retractor was then placed within the incision and the lower uterine segment exposed. The bladder flap was developed with Metzenbaum scissors and pushed away from the lower uterine segment. The lower uterine segment was then incised in a low transverse fashion and the cavity itself entered bluntly. The incision was extended bluntly. Amniotic sac was ruptured and fluid was noted to be clear in color. Fetal buttocks noted to be presenting part. With gentle fundal pressure, buttocks and BL LE delivered using Pinard maneuver x2. Towel covering body, delivered until fetal scalpulae visible. BL UE swept across fetal thorax and delivered. Using MSV, fetal vertex delivered. The infant was handed off to NICU. The placenta was then spontaneously expressed from the uterus and the uterus cleared of all clots and debris with moist lap sponge. The uterine incision was then repaired in 2 layers the first layer was a running locked layer of  and the second an imbricating layer of the same suture. The tubes and ovaries were inspected and the gutters cleared of all clots and debris. The uterine incision was inspected and found to be hemostatic. All instruments and sponges as well as the Alexis retractor were then removed from the abdomen. Irrigation performed, excellent hemostasis noted.  The rectus muscles were then reapproximated with several interrupted mattress sutures of 2-0 Vicryl. The fascia was then closed with 0 Vicryl in a running fashion. The skin was closed with a subcuticular stitch  of 4-0 Vicryl on a Keith needle and then reinforced with Dermabond and a Honeycomb dressing.  At the conclusion of the procedure all instruments and sponge counts were correct. Patient was taken to the recovery room in good condition with her baby accompanying her skin to skin.

## 2021-08-04 NOTE — Interval H&P Note (Signed)
History and Physical Interval Note:  08/04/2021 11:28 AM  Lisa Townsend  has presented today for surgery, with the diagnosis of Unscheduled Cesarean Section, See Delivery Summary.  The various methods of treatment have been discussed with the patient and family. After consideration of risks, benefits and other options for treatment, the patient has consented to  Procedure(s): CESAREAN SECTION (N/A) as a surgical intervention.  The patient's history has been reviewed, patient examined, no change in status, stable for surgery.  I have reviewed the patient's chart and labs.  Questions were answered to the patient's satisfaction.    As stated in interval note below, unable to successfully perform ECV, persistent breech presentation. Bps have remained elevated but non-severe range. PreE labs ordered this AM all WNL, uPC still pending. At this time, will move forward with delivery via PLTCS for fetal malpresentation in setting for newly diagnosed GHTN vs PreE w/o SF (pending labs).  R/B/A of cesarean section discussed with patient. Alternative would be vaginal delivery which would mean shorter postpartum stay and decreased risk of bleeding. Risks of section include infection of the uterus, pelvic organs, or skin, inadvertent injury to internal organs, such as bowel or bladder. If there is major injury, extensive surgery may be required. If injury is minor, it may be treated with relative ease. Discussed possibility of excessive blood loss and transfusion. If bleeding cannot be controlled using medical or minor surgical methods, a cesarean hysterectomy may be performed which would mean no future fertility. Patient accepts the possibility of blood transfusion, if necessary. Patient understands and agrees to move forward with section.  [place]

## 2021-08-04 NOTE — Anesthesia Preprocedure Evaluation (Addendum)
Anesthesia Evaluation  Patient identified by MRN, date of birth, ID band Patient awake    Reviewed: Allergy & Precautions, NPO status , Patient's Chart, lab work & pertinent test results  Airway Mallampati: II  TM Distance: >3 FB Neck ROM: Full    Dental  (+) Teeth Intact   Pulmonary neg pulmonary ROS,    Pulmonary exam normal        Cardiovascular negative cardio ROS   Rhythm:Regular Rate:Normal     Neuro/Psych  Headaches, Anxiety    GI/Hepatic negative GI ROS, Neg liver ROS,   Endo/Other  Hypothyroidism   Renal/GU negative Renal ROS  negative genitourinary   Musculoskeletal negative musculoskeletal ROS (+)   Abdominal (+)  Abdomen: soft. Bowel sounds: normal.  Peds  Hematology negative hematology ROS (+)   Anesthesia Other Findings   Reproductive/Obstetrics                             Anesthesia Physical Anesthesia Plan  ASA: 2  Anesthesia Plan: Spinal   Post-op Pain Management:    Induction:   PONV Risk Score and Plan: 2 and Ondansetron, Dexamethasone and Treatment may vary due to age or medical condition  Airway Management Planned: Natural Airway and Nasal Cannula  Additional Equipment: None  Intra-op Plan:   Post-operative Plan:   Informed Consent: I have reviewed the patients History and Physical, chart, labs and discussed the procedure including the risks, benefits and alternatives for the proposed anesthesia with the patient or authorized representative who has indicated his/her understanding and acceptance.     Dental advisory given  Plan Discussed with: CRNA  Anesthesia Plan Comments: (Lab Results      Component                Value               Date                      WBC                      8.0                 08/04/2021                HGB                      11.3 (L)            08/04/2021                HCT                      32.5 (L)             08/04/2021                MCV                      95.9                08/04/2021                PLT                      154  08/04/2021          )        Anesthesia Quick Evaluation

## 2021-08-04 NOTE — Transfer of Care (Signed)
Immediate Anesthesia Transfer of Care Note  Patient: [name]  Procedure(s) Performed: CESAREAN SECTION  Patient Location: PACU  Anesthesia Type:Spinal  Level of Consciousness: awake and alert   Airway & Oxygen Therapy: Patient Spontanous Breathing  Post-op Assessment: Report given to RN and Post -op Vital signs reviewed and stable  Post vital signs: Reviewed and stable  Last Vitals:  Vitals Value Taken Time  BP    Temp    Pulse 81 08/04/21 1326  Resp 18 08/04/21 1326  SpO2 98 % 08/04/21 1326  Vitals shown include unvalidated device data.  Last Pain:  Vitals:   08/04/21 0741  TempSrc: Oral  PainSc: 8          Complications: No notable events documented.

## 2021-08-04 NOTE — Progress Notes (Signed)
Pt ambulated to the bathroom with slow and steady gait.  States that she feels some pulling at the incision site and she rates her pain at 3 but it is tolerable.  Peri care given with instructions.  Pt  verbalizes understanding.  Back to bed without incident.

## 2021-08-04 NOTE — Anesthesia Postprocedure Evaluation (Signed)
Anesthesia Post Note  Patient: Laurren Lepkowski Lyall  Procedure(s) Performed: [place]     Patient location during evaluation: Mother Baby Anesthesia Type: Spinal Level of consciousness: oriented and awake and alert Pain management: pain level controlled Vital Signs Assessment: post-procedure vital signs reviewed and stable Respiratory status: spontaneous breathing and respiratory function stable Cardiovascular status: blood pressure returned to baseline and stable Postop Assessment: no headache, no backache, no apparent nausea or vomiting and able to ambulate Anesthetic complications: no   No notable events documented.  Last Vitals:  Vitals:   08/04/21 1400 08/04/21 1415  BP: (!) 135/93 102/81  Pulse: 78 73  Resp: 17 15  Temp:    SpO2: 99% 100%    Last Pain:  Vitals:   08/04/21 1415  TempSrc:   PainSc: 3     LLE Motor Response: Purposeful movement (08/04/21 1415)   RLE Motor Response: Purposeful movement (08/04/21 1415)        Belenda Cruise P Javona Bergevin

## 2021-08-05 LAB — CBC
HCT: 24.8 % — ABNORMAL LOW (ref 36.0–46.0)
Hemoglobin: 8.7 g/dL — ABNORMAL LOW (ref 12.0–15.0)
MCH: 33.6 pg (ref 26.0–34.0)
MCHC: 35.1 g/dL (ref 30.0–36.0)
MCV: 95.8 fL (ref 80.0–100.0)
Platelets: 135 10*3/uL — ABNORMAL LOW (ref 150–400)
RBC: 2.59 MIL/uL — ABNORMAL LOW (ref 3.87–5.11)
RDW: 12.8 % (ref 11.5–15.5)
WBC: 11.9 10*3/uL — ABNORMAL HIGH (ref 4.0–10.5)
nRBC: 0 % (ref 0.0–0.2)

## 2021-08-05 MED ORDER — FERROUS SULFATE 325 (65 FE) MG PO TABS
325.0000 mg | ORAL_TABLET | Freq: Every day | ORAL | Status: DC
  Administered 2021-08-05 – 2021-08-06 (×2): 325 mg via ORAL
  Filled 2021-08-05 (×2): qty 1

## 2021-08-05 NOTE — Progress Notes (Addendum)
Subjective: Postpartum Day 1: Cesarean Delivery Lisa Townsend is doing very well. She is ambulating and tolerating PO. Foley just removed so she has not yet voided. Pain controlled.   Objective: Patient Vitals for the past 24 hrs:  BP Temp Temp src Pulse Resp SpO2  08/05/21 0930 128/65 97.9 F (36.6 C) Oral 88 16 99 %  08/05/21 0345 128/66 98.7 F (37.1 C) Oral 77 20 100 %  08/05/21 0230 -- -- -- -- 16 100 %  08/05/21 0130 -- -- -- -- 17 98 %  08/05/21 0030 -- -- -- -- 18 100 %  08/04/21 2330 134/64 -- -- 71 18 100 %  08/04/21 2230 131/70 -- Oral -- 19 99 %  08/04/21 2130 -- -- -- -- 18 99 %  08/04/21 2030 133/76 98 F (36.7 C) Oral 76 18 98 %  08/04/21 1930 136/70 97.6 F (36.4 C) Oral 73 17 100 %  08/04/21 1825 (!) 156/80 97.6 F (36.4 C) Oral 66 18 100 %  08/04/21 1545 140/73 (!) 97.5 F (36.4 C) Axillary 69 18 99 %  08/04/21 1433 140/81 98.1 F (36.7 C) Oral 78 16 100 %  08/04/21 1415 102/81 -- -- 73 15 100 %  08/04/21 1400 (!) 135/93 -- -- 78 17 99 %  08/04/21 1345 135/81 -- -- 81 19 98 %  08/04/21 1330 131/81 98.2 F (36.8 C) -- 82 16 99 %  08/04/21 1326 129/72 -- -- 81 18 98 %    Physical Exam:  General: alert, cooperative, and no distress Lochia: appropriate Uterine Fundus: firm Incision: healing well, no significant drainage, no dehiscence, no significant erythema DVT Evaluation: No evidence of DVT seen on physical exam.  Recent Labs    08/04/21 0735 08/05/21 0556  HGB 11.3* 8.7*  HCT 32.5* 24.8*    Assessment/Plan:  Lisa Townsend G1P1001 POD#1 sp primary cesarean at [redacted]w[redacted]d for breech, gestational hypertension 1. PPC: continue routine postpartum care 2. Acute blood loss anemia: PO iron 3. Desires neonatal circumcision, R/B/A of procedure discussed at length. Pt understands that neonatal circumcision is not considered medically necessary and is elective. The risks include, but are not limited to bleeding, infection, damage to the penis, development of scar  tissue, and having to have it redone at a later date. Pt understands theses risks and wishes to proceed.  4. Gestational HTN: normotensive last 12 hours, continue to monitor 5. Rh positive, rubella immune. S/p tdap and covid vaccines prenatally 6. Dispo: plan for discharge POD 2 or 3    Rowland Lathe 08/05/2021, 10:55 AM

## 2021-08-05 NOTE — Social Work (Signed)
MOB was referred for history of anxiety/PTSD.  * Referral screened out by Clinical Social Worker because none of the following criteria appear to apply: ~ History of anxiety/depression during this pregnancy, or of post-partum depression following prior delivery. ~ Diagnosis of anxiety and/or depression within last 3 years OR * MOB's symptoms currently being treated with medication and/or therapy. Per chart review, it is noted MOB symptoms are being treated with Zoloft and therapy.   Please contact the Clinical Social Worker if needs arise, by [facility] request, or if MOB scores greater than 9/yes to question 10 on [place] Postpartum Depression Screen.   Kathrin Greathouse, MSW, LCSW [facility] [facility] Worker  [phone] 08/05/2021  8:52 AM

## 2021-08-05 NOTE — Lactation Note (Addendum)
This note was copied from a baby's chart. Lactation Consultation Note  Patient Name: Lisa Townsend [id number] Date: 08/05/2021 Reason for consult: Follow-up assessment;Mother's request;Difficult latch;1st time breastfeeding;Early term 37-38.6wks Age:46 hours, -3% weight loss, per dad, they have been feeding infant every 4 hours. LC discussed with parents to feeding infant according to hunger cues, 8 to 12+ or more times within 24 hours, skin to skin. LC discussed infant small tummy size and referred to " Your Baby Book". Mom has not latched infant at the breast, has mostly formula feed today, however,  mom does want to breastfeed infant, per mom, infant was not latching and she did not know how to use NS.  LC reviewed how to apply 20 mm NS, mom latched infant on her left breast using the football hold position, infant sustained latch with depth, was still breastfeeding after 10 minutes when LC left the room. Mom understands to latch infant on both breast during a feeding. Mom concern she doesn't have any colostrum, a  smear  of colostrum was  present with hand expression. LC encourage  mom to latch infant with every feeding  before offering formula to help stimulate and establish her milk supply and to use DEBP every 3 hours for 15 minutes.   Mom's plan: 1- Apply 20 mm NS, breastfeed infant according to feeding cues. 2- Afterwards continue to supplement infant with formula, dad been offering 20 mls per feeding and will continue to offer that on Day 2. 3- Mom will use her Personal DEBP and pump every 3 hours for 15 minutes on initial setting, giving infant back any EBM first before offering formula. 4- Mom knows to call RN/LC for latch assistance if needed or help in applying 20 mm NS.  Maternal Data    Feeding Mother's Current Feeding Choice: Breast Milk and Formula  LATCH Score Latch: Grasps breast easily, tongue down, lips flanged, rhythmical sucking.  Audible Swallowing: Spontaneous  and intermittent  Type of Nipple: Flat  Comfort (Breast/Nipple): Soft / non-tender  Hold (Positioning): Assistance needed to correctly position infant at breast and maintain latch.  LATCH Score: 8   Lactation Tools Discussed/Used Tools: Nipple Shields Nipple shield size: 20 (flat nipples)  Interventions Interventions: Skin to skin;Pre-pump if needed;Position options;Support pillows;Adjust position;Breast compression;Education  Discharge Pump: Personal  Consult Status Consult Status: Follow-up Date: 08/06/21 Follow-up type: In-patient    Vicente Serene 08/05/2021, 9:28 PM

## 2021-08-05 NOTE — Progress Notes (Signed)
   08/05/21 2108 08/05/21 2135  Vital Signs  BP (!) 149/81 (up moving around prior to reading) (!) 145/79  BP Location Right Arm Right Arm  Patient Position (if appropriate) Sitting Sitting  BP Method Automatic Automatic  Pulse Rate 95 87   RN notified Dr. Brien Mates of pt's most recent BP's. RN given new orders to continue to monitor and to only notify if BP is within severe range. RN will continue to monitor.    Marguerita Beards, RN

## 2021-08-06 LAB — SURGICAL PATHOLOGY

## 2021-08-06 MED ORDER — OXYCODONE HCL 5 MG PO TABS: 5.0000 mg | ORAL_TABLET | Freq: Four times a day (QID) | ORAL | 0 refills | Status: DC | PRN

## 2021-08-06 NOTE — Progress Notes (Signed)
Instructed patient to make an appointment for blood pressure check in 1 week. Patient verbalized understanding

## 2021-08-06 NOTE — Progress Notes (Signed)
Subjective: Postpartum Day 2: Cesarean Delivery. Patient doing well. She is ambulating, tolerating PO, voiding spontaneously. Pain controlled.  Lochia is appropriate  Objective: Patient Vitals for the past 24 hrs:  BP Temp Temp src Pulse Resp SpO2  08/06/21 0510 126/69 97.7 F (36.5 C) Oral 77 18 99 %  08/05/21 2220 129/66 -- -- 88 -- --  08/05/21 2135 (!) 145/79 -- -- 87 -- --  08/05/21 2108 (!) 149/81 97.6 F (36.4 C) Axillary 95 19 99 %  08/05/21 1533 127/73 98.1 F (36.7 C) Oral 77 17 100 %  08/05/21 1330 133/78 97.6 F (36.4 C) Oral 77 16 100 %  08/05/21 0930 128/65 97.9 F (36.6 C) Oral 88 16 99 %    Physical Exam:  General: alert, cooperative, and no distress Lochia: appropriate Uterine Fundus: firm Incision: healing well, no significant drainage, no dehiscence, no significant erythema DVT Evaluation: No evidence of DVT seen on physical exam.  Recent Labs    08/04/21 0735 08/05/21 0556  HGB 11.3* 8.7*  HCT 32.5* 24.8*    Assessment/Plan:  Lisa Townsend G1P1001 POD#2 sp primary cesarean at [redacted]w[redacted]d for breech, gestational hypertension 1. PPC: continue routine postpartum care.  Reports pain control ok, could be better.  Was unaware she could ask for oxycodone.  Desire small rx at discharge 2. Acute blood loss anemia: PO iron 3. Gestational HTN: normotensive since yesterday evening, most recent 120s/60s.  Asymptomatic.  Do not feel medication is required at this time.  She desires discharge today. Plan short interval BP check in office 4. Rh positive, rubella immune. S/p tdap and covid vaccines prenatally Meeting all goals.  Discharge to home today.     [place] 08/06/2021, 9:07 AM

## 2021-08-06 NOTE — Discharge Summary (Signed)
Postpartum Discharge Summary  Date of Service updated 08/06/21     Patient Name: Lisa Townsend DOB: 02/24/1976 MRN: [id number]  Date of admission: 08/04/2021 Delivery date:08/04/2021  Delivering provider: Deliah Boston  Date of discharge: 08/06/2021  Admitting diagnosis: Breech presentation of fetus [O32.1XX0] Breech birth [O32.1XX0] Intrauterine pregnancy: [redacted]w[redacted]d     Secondary diagnosis:  Active Problems:   Breech presentation of fetus   Breech birth  Additional problems: IVF, MFM, Hypothyroidism, PTSD, GHTN   Discharge diagnosis: Term Pregnancy Delivered and Gestational Hypertension                                              Post partum procedures: none Augmentation: N/A Complications: None  Hospital course: Admitted for attempted ECV.  ECV failed, so she underwent a cesarean delivery for breech presentation and gestational hypertension  45 y.o. yo G1P1001 at [redacted]w[redacted]d was admitted to the hospital 08/04/2021 for scheduled cesarean section with the following indication:Malpresentation.Delivery details are as follows:  Membrane Rupture Time/Date: 12:39 PM ,08/04/2021   Delivery Method:C-Section, Low Transverse  Details of operation can be found in separate operative note.  Patient had an uncomplicated postpartum course.  She is ambulating, tolerating a regular diet, passing flatus, and urinating well. Patient is discharged home in stable condition on  08/06/21        Newborn Data: Birth date:08/04/2021  Birth time:12:43 PM  Gender:Female  Living status:Living  Apgars:7 ,9  Weight:3031 g      Physical exam  Vitals:   08/05/21 2108 08/05/21 2135 08/05/21 2220 08/06/21 0510  BP: (!) 149/81 (!) 145/79 129/66 126/69  Pulse: 95 87 88 77  Resp: 19   18  Temp: 97.6 F (36.4 C)   97.7 F (36.5 C)  TempSrc: Axillary   Oral  SpO2: 99%   99%  Weight:      Height:       General: alert, cooperative, and no distress Lochia: appropriate Uterine Fundus: firm Incision: Healing  well with no significant drainage DVT Evaluation: No evidence of DVT seen on physical exam. Calf/Ankle edema is present Labs: Lab Results  Component Value Date   WBC 11.9 (H) 08/05/2021   HGB 8.7 (L) 08/05/2021   HCT 24.8 (L) 08/05/2021   MCV 95.8 08/05/2021   PLT 135 (L) 08/05/2021   CMP Latest Ref Rng & Units 08/04/2021  Glucose 70 - 99 mg/dL 75  BUN 6 - 20 mg/dL 12  Creatinine 0.44 - 1.00 mg/dL 0.91  Sodium 135 - 145 mmol/L 137  Potassium 3.5 - 5.1 mmol/L 3.6  Chloride 98 - 111 mmol/L 110  CO2 22 - 32 mmol/L 19(L)  Calcium 8.9 - 10.3 mg/dL 8.5(L)  Total Protein 6.5 - 8.1 g/dL 5.4(L)  Total Bilirubin 0.3 - 1.2 mg/dL 0.9  Alkaline Phos 38 - 126 U/L 172(H)  AST 15 - 41 U/L 29  ALT 0 - 44 U/L 20   Edinburgh Score: Edinburgh Postnatal Depression Scale Screening Tool 08/06/2021  I have been able to laugh and see the funny side of things. (No Data)      After visit meds:  Allergies as of 08/06/2021       Reactions   Claritin [loratadine]    shakiness   Latex Itching   Lexapro [escitalopram] Nausea Only        Medication List  STOP taking these medications    aspirin EC 81 MG tablet   Butalbital-APAP-Caffeine 50-300-40 MG Caps   cetirizine 10 MG tablet Commonly known as: ZYRTEC   promethazine 25 MG tablet Commonly known as: PHENERGAN       TAKE these medications    albuterol 108 (90 Base) MCG/ACT inhaler Commonly known as: VENTOLIN HFA Inhale 2 puffs into the lungs every 4 (four) hours as needed for wheezing or shortness of breath (coughing).   budesonide-formoterol 80-4.5 MCG/ACT inhaler Commonly known as: Symbicort Inhale 2 puffs into the lungs in the morning and at bedtime. with spacer and rinse mouth afterwards.   EPINEPHrine 0.3 mg/0.3 mL Soaj injection Commonly known as: EPI-PEN Inject 0.3 mLs (0.3 mg total) into the muscle once as needed for up to 1 dose for anaphylaxis.   levothyroxine 75 MCG tablet Commonly known as: SYNTHROID TAKE 1  TABLET BY MOUTH DAILY BEFORE BREAKFAST.   Olopatadine HCl 0.2 % Soln Apply 1 drop to eye daily as needed (itchy/watery eyes).   oxyCODONE 5 MG immediate release tablet Commonly known as: Oxy IR/ROXICODONE Take 1 tablet (5 mg total) by mouth every 6 (six) hours as needed for breakthrough pain.   PRENATAL 1+1 PO Prenatal   rizatriptan 10 MG tablet Commonly known as: MAXALT TAKE 1 TABLET BY MOUTH AS NEEDED FOR MIGRAINE. MAY REPEAT IN 2 HOURS IF NEEDED   sertraline 25 MG tablet Commonly known as: ZOLOFT Take 1 tablet (25 mg total) by mouth daily.   valACYclovir 1000 MG tablet Commonly known as: VALTREX Take by mouth.         Discharge home in stable condition Infant Feeding: Breast Infant Disposition:home with mother Discharge instruction: per After Visit Summary and Postpartum booklet. Activity: Advance as tolerated. Pelvic rest for 6 weeks.  Diet: routine diet Anticipated Birth Control: Unsure Postpartum Appointment:6 weeks Additional Postpartum F/U: BP check 1 week Future Appointments: Future Appointments  Date Time Provider [place]  09/15/2021  1:30 PM Haydee Salter, MD [id number]   Follow up Visit:      08/06/2021 [facility] Carlis Abbott, MD

## 2021-08-06 NOTE — Progress Notes (Signed)
Pt requested to "not be disturb, that she was exhausted and needed a period of rest". RN informed pt that she would be back to check in, around the 3 hour mark to allow them to rest. RN also told the pt to call out if they needed anything. RN placed a "Patient Resting" sign on the door.    Marguerita Beards, RN

## 2021-08-09 ENCOUNTER — Encounter: Payer: Self-pay | Admitting: Family Medicine

## 2021-08-11 NOTE — Telephone Encounter (Signed)
Appointment scheduled for 08/13/21 @ 11:30 am. Lisa Townsend

## 2021-08-14 ENCOUNTER — Telehealth (HOSPITAL_COMMUNITY): Payer: Self-pay

## 2021-08-14 NOTE — Telephone Encounter (Signed)
No answer. No voicemail.  Sharyn Lull [facility] 08/14/2021,1509

## 2021-08-16 ENCOUNTER — Inpatient Hospital Stay (HOSPITAL_COMMUNITY)
Admission: AD | Admit: 2021-08-16 | Payer: Federal, State, Local not specified - PPO | Source: Home / Self Care | Admitting: Obstetrics and Gynecology

## 2021-08-16 ENCOUNTER — Inpatient Hospital Stay (HOSPITAL_COMMUNITY): Payer: Federal, State, Local not specified - PPO

## 2021-08-18 ENCOUNTER — Telehealth: Payer: Self-pay

## 2021-08-18 NOTE — Telephone Encounter (Signed)
Please call patient. Now that she is no longer pregnant, we can increase the dosing.  She is also due for a follow up visit.   Please adjust the flowsheet - use schedule A to increase the dosing weekly until reached red 0.33mL.  Thank you.

## 2021-08-18 NOTE — Telephone Encounter (Signed)
Patient called and stated that recently had her baby a couple of weeks abo therefore she has not gotten her injection. Patient wants to know if it is safe to get continue her injections and will she start to increase or still stay on her low doses. Please advise. Thank you!

## 2021-08-19 NOTE — Telephone Encounter (Signed)
Patient's allergy flow sheet has been updated to review these changes. Attempted to call patient to inform but there was no answer and no voicemail set up. Will need to attempt to call patient again.

## 2021-08-24 NOTE — Telephone Encounter (Signed)
Attempted to call patient. There was no answer and unable to leave voicemail. Will need to attempt to call again.

## 2021-08-25 ENCOUNTER — Encounter: Payer: Self-pay | Admitting: *Deleted

## 2021-08-25 NOTE — Telephone Encounter (Signed)
My Chart message sent

## 2021-08-26 ENCOUNTER — Ambulatory Visit (INDEPENDENT_AMBULATORY_CARE_PROVIDER_SITE_OTHER): Payer: Federal, State, Local not specified - PPO

## 2021-08-26 ENCOUNTER — Other Ambulatory Visit: Payer: Self-pay

## 2021-08-26 DIAGNOSIS — J309 Allergic rhinitis, unspecified: Secondary | ICD-10-CM

## 2021-08-31 ENCOUNTER — Ambulatory Visit (INDEPENDENT_AMBULATORY_CARE_PROVIDER_SITE_OTHER): Payer: Federal, State, Local not specified - PPO

## 2021-08-31 ENCOUNTER — Other Ambulatory Visit: Payer: Self-pay

## 2021-08-31 DIAGNOSIS — J309 Allergic rhinitis, unspecified: Secondary | ICD-10-CM

## 2021-09-02 ENCOUNTER — Other Ambulatory Visit: Payer: Self-pay

## 2021-09-02 ENCOUNTER — Ambulatory Visit: Payer: Federal, State, Local not specified - PPO | Admitting: Family Medicine

## 2021-09-02 VITALS — BP 118/76 | HR 57 | Temp 97.7°F | Ht 61.0 in | Wt 168.2 lb

## 2021-09-02 DIAGNOSIS — F411 Generalized anxiety disorder: Secondary | ICD-10-CM

## 2021-09-02 DIAGNOSIS — Z98891 History of uterine scar from previous surgery: Secondary | ICD-10-CM | POA: Diagnosis not present

## 2021-09-02 DIAGNOSIS — E039 Hypothyroidism, unspecified: Secondary | ICD-10-CM

## 2021-09-02 DIAGNOSIS — J069 Acute upper respiratory infection, unspecified: Secondary | ICD-10-CM | POA: Diagnosis not present

## 2021-09-02 MED ORDER — SERTRALINE HCL 25 MG PO TABS: 25.0000 mg | ORAL_TABLET | Freq: Every day | ORAL | 3 refills | Status: DC

## 2021-09-02 NOTE — Progress Notes (Signed)
[facility] [address] [place] [address] Dept: [phone] Dept Fax: [phone]  Office Visit  Subjective:    Patient ID: Lisa Townsend, female    DOB: 01/05/1976, 45 y.o..   MRN: [id number]  Chief Complaint  Patient presents with   Follow-up    F/u anxiety.  Also c/o body aches, nasal burning, ST x 2-3 weeks.  She has taken Day quil, NyQuil and Sudafed with little relief .      History of Present Illness:  Patient is in today for reassessment of her anxiety. Lisa Townsend had a C-section on 9/28 due to breech presentation of her infant. Lisa Townsend has a history of generalized anxiety. She had previously been treated with diazepam. I started her on low dose Zoloft in July. She feels this is working well and denies any anxiety issues or depressive feelings.  Lisa Townsend states her C-section wound is healing well. She is scheduled for a 6 week postpartum check with her OB-GYN.  Lisa Townsend has a history of hypothyroidism. She continues on Synthroid.  Lisa Townsend notes that over the past two weeks she has been having body aches, nasal congestion, rhinorrhea and more recently some sore throat. She is not running fever or having cough. She performed a home COVID test which was negative. She is taking Dayquil, Nyquil, and Sudafed, but does not feel this has resolved her symptoms.  Past Medical History: Patient Active Problem List   Diagnosis Date Noted   Breech presentation of fetus 08/04/2021   Breech birth 08/04/2021   Pregnant 01/12/2021   Seasonal and perennial allergic rhinoconjunctivitis 07/14/2020   Primary insomnia 08/02/2017   Constipation 10/13/2016   Sciatica, right 08/02/2016   H/O vitamin D deficiency 06/08/2016   Generalized anxiety disorder 05/21/2015   Chronic migraine without aura without status migrainosus, not intractable 05/05/2015   Hypothyroidism 01/06/2014   Past Surgical History:  Procedure Laterality Date    BREAST BIOPSY Left    CESAREAN SECTION N/A 08/04/2021   Procedure: CESAREAN SECTION;  Surgeon: Deliah Boston, MD;  Location: [facility]S;  Service: Obstetrics;  Laterality: N/A;   NOSE SURGERY     Family History  Problem Relation Age of Onset   Urolithiasis Father    Cancer Mother    Hypertension Mother    Breast cancer Mother 2   Allergic rhinitis Brother    Breast cancer Maternal Grandmother        dx under 25   Stomach cancer Paternal Grandmother        dx in her 62s   Alcohol abuse Maternal Uncle    Prostate cancer Paternal Uncle        mid 55s   Asthma Neg Hx    Eczema Neg Hx    Urticaria Neg Hx    Outpatient Medications Prior to Visit  Medication Sig Dispense Refill   albuterol (VENTOLIN HFA) 108 (90 Base) MCG/ACT inhaler Inhale 2 puffs into the lungs every 4 (four) hours as needed for wheezing or shortness of breath (coughing). 8 g 2   budesonide-formoterol (SYMBICORT) 80-4.5 MCG/ACT inhaler Inhale 2 puffs into the lungs in the morning and at bedtime. with spacer and rinse mouth afterwards. 1 each 5   EPINEPHrine 0.3 mg/0.3 mL IJ SOAJ injection Inject 0.3 mLs (0.3 mg total) into the muscle once as needed for up to 1 dose for anaphylaxis. 0.3 mL 2   levothyroxine (SYNTHROID) 75 MCG tablet TAKE 1 TABLET BY  MOUTH DAILY BEFORE BREAKFAST. 90 tablet 1   Olopatadine HCl 0.2 % SOLN Apply 1 drop to eye daily as needed (itchy/watery eyes). 2.5 mL 5   Prenatal Vit-Fe Fumarate-FA (PRENATAL 1+1 PO) Prenatal     rizatriptan (MAXALT) 10 MG tablet TAKE 1 TABLET BY MOUTH AS NEEDED FOR MIGRAINE. MAY REPEAT IN 2 HOURS IF NEEDED 10 tablet 3   sertraline (ZOLOFT) 25 MG tablet Take 1 tablet (25 mg total) by mouth daily. 30 tablet 3   valACYclovir (VALTREX) 1000 MG tablet Take by mouth. (Patient not taking: Reported on 09/02/2021)     oxyCODONE (OXY IR/ROXICODONE) 5 MG immediate release tablet Take 1 tablet (5 mg total) by mouth every 6 (six) hours as needed for breakthrough pain. 8 tablet 0    No facility-administered medications prior to visit.   Allergies  Allergen Reactions   Claritin [Loratadine]     shakiness   Latex Itching   Lexapro [Escitalopram] Nausea Only     Objective:   Today's Vitals   09/02/21 1139  BP: 118/76  Pulse: (!) 57  Temp: 97.7 F (36.5 C)  TempSrc: Temporal  SpO2: 98%  Weight: 168 lb 3.2 oz (76.3 kg)  Height: 5\' 1"  (1.549 m)   Body mass index is 31.78 kg/m.   General: Well developed, well nourished. No acute distress. HEENT: Normocephalic, non-traumatic. PERRL, EOMI. Conjunctiva clear. External ears   normal. EAC and TMs normal bilaterally. Nose clear without congestion or rhinorrhea. No pain   on percussion over the sinuses. Mucous membranes moist. Oropharynx clear. Good dentition. Neck: Supple. No lymphadenopathy. No thyromegaly. Lungs: Clear to auscultation bilaterally. No wheezing, rales or rhonchi. CV: RRR without murmurs or rubs. Pulses 2+ bilaterally. Abdomen: Surgical wound healing well. No redness or drainage. Back: Straight. No CVA tenderness bilaterally. Psych: Alert and oriented. Normal mood and affect.  Health Maintenance Due  Topic Date Due   COLONOSCOPY (Pts 45-59yrs Insurance coverage will need to be confirmed)  Never done   COVID-19 Vaccine (3 - Booster for Janssen series) 11/01/2020   MAMMOGRAM  01/13/2021   INFLUENZA VACCINE  06/07/2021     Assessment & Plan:   1. Generalized anxiety disorder Stable on Zoloft. We will cotninue this.  - sertraline (ZOLOFT) 25 MG tablet; Take 1 tablet (25 mg total) by mouth daily.  Dispense: 90 tablet; Refill: 3  2. S/P cesarean section Surgical wound healing well. We discussed her doing self-massage of the wound with lotion to reduce incisional pain. I advised her that she can be doing walking, but do not advise she do any weight lifting until cleared by her OB doctor.  3. Acquired hypothyroidism Lisa Townsend will need a repeat TSH once she completes her postpartum transition.  If her OB does not check this at follow-up,. I will plan to see her in 2 months and check it then.  4. Viral URI Discussed home care for viral illness, including rest, pushing fluids, and OTC medications as needed for symptom relief. Recommend hot tea with honey for sore throat symptoms. Follow-up if needed for worsening or persistent symptoms.  Haydee Salter, MD

## 2021-09-03 ENCOUNTER — Ambulatory Visit: Payer: Federal, State, Local not specified - PPO | Admitting: Family Medicine

## 2021-09-09 ENCOUNTER — Ambulatory Visit (INDEPENDENT_AMBULATORY_CARE_PROVIDER_SITE_OTHER): Payer: Federal, State, Local not specified - PPO

## 2021-09-09 ENCOUNTER — Other Ambulatory Visit: Payer: Self-pay

## 2021-09-09 DIAGNOSIS — J309 Allergic rhinitis, unspecified: Secondary | ICD-10-CM | POA: Diagnosis not present

## 2021-09-14 ENCOUNTER — Ambulatory Visit (INDEPENDENT_AMBULATORY_CARE_PROVIDER_SITE_OTHER): Payer: Federal, State, Local not specified - PPO

## 2021-09-14 ENCOUNTER — Other Ambulatory Visit: Payer: Self-pay

## 2021-09-14 DIAGNOSIS — J309 Allergic rhinitis, unspecified: Secondary | ICD-10-CM | POA: Diagnosis not present

## 2021-09-14 DIAGNOSIS — Z3040 Encounter for surveillance of contraceptives, unspecified: Secondary | ICD-10-CM | POA: Diagnosis not present

## 2021-09-14 DIAGNOSIS — Z1331 Encounter for screening for depression: Secondary | ICD-10-CM | POA: Diagnosis not present

## 2021-09-15 ENCOUNTER — Ambulatory Visit: Payer: Federal, State, Local not specified - PPO | Admitting: Family Medicine

## 2021-09-23 ENCOUNTER — Other Ambulatory Visit: Payer: Self-pay

## 2021-09-23 ENCOUNTER — Ambulatory Visit (INDEPENDENT_AMBULATORY_CARE_PROVIDER_SITE_OTHER): Payer: Federal, State, Local not specified - PPO

## 2021-09-23 DIAGNOSIS — J309 Allergic rhinitis, unspecified: Secondary | ICD-10-CM

## 2021-09-28 ENCOUNTER — Ambulatory Visit (INDEPENDENT_AMBULATORY_CARE_PROVIDER_SITE_OTHER): Payer: Federal, State, Local not specified - PPO

## 2021-09-28 ENCOUNTER — Other Ambulatory Visit: Payer: Self-pay | Admitting: Family Medicine

## 2021-09-28 ENCOUNTER — Other Ambulatory Visit: Payer: Self-pay

## 2021-09-28 ENCOUNTER — Ambulatory Visit
Admission: EM | Admit: 2021-09-28 | Discharge: 2021-09-28 | Disposition: A | Discharge status: Home / Self Care | Payer: Federal, State, Local not specified - PPO | Attending: Emergency Medicine | Admitting: Emergency Medicine

## 2021-09-28 DIAGNOSIS — R051 Acute cough: Secondary | ICD-10-CM | POA: Diagnosis not present

## 2021-09-28 DIAGNOSIS — B349 Viral infection, unspecified: Secondary | ICD-10-CM

## 2021-09-28 DIAGNOSIS — J309 Allergic rhinitis, unspecified: Secondary | ICD-10-CM

## 2021-09-28 DIAGNOSIS — F411 Generalized anxiety disorder: Secondary | ICD-10-CM

## 2021-09-28 DIAGNOSIS — R0981 Nasal congestion: Secondary | ICD-10-CM

## 2021-09-28 LAB — POCT INFLUENZA A/B
Influenza A, POC: POSITIVE — AB
Influenza B, POC: NEGATIVE

## 2021-09-28 MED ORDER — PREDNISONE 10 MG (21) PO TBPK: ORAL_TABLET | Freq: Every day | ORAL | 0 refills | Status: DC

## 2021-09-28 MED ORDER — ALBUTEROL SULFATE HFA 108 (90 BASE) MCG/ACT IN AERS: 1.0000 | INHALATION_SPRAY | Freq: Four times a day (QID) | RESPIRATORY_TRACT | 0 refills | Status: DC | PRN

## 2021-09-28 NOTE — ED Provider Notes (Signed)
[facility]    CSN: [id number] Arrival date & time: 09/28/21  1520      History   Chief Complaint No chief complaint on file.   HPI Lisa Townsend is a 45 y.o. female.   Pt is here with cough congestion and body aches and sore throat for 2 days now. Pt has been taking motrin for sx. With no relief. No one at home is sick.    Past Medical History:  Diagnosis Date   Chronic constipation    Hypothyroidism    Migraine    Urticaria    Vitamin D deficiency     Patient Active Problem List   Diagnosis Date Noted   Breech presentation of fetus 08/04/2021   Breech birth 08/04/2021   Pregnant 01/12/2021   Seasonal and perennial allergic rhinoconjunctivitis 07/14/2020   Primary insomnia 08/02/2017   Constipation 10/13/2016   Sciatica, right 08/02/2016   H/O vitamin D deficiency 06/08/2016   Generalized anxiety disorder 05/21/2015   Chronic migraine without aura without status migrainosus, not intractable 05/05/2015   Hypothyroidism 01/06/2014    Past Surgical History:  Procedure Laterality Date   BREAST BIOPSY Left    CESAREAN SECTION N/A 08/04/2021   Procedure: CESAREAN SECTION;  Surgeon: Deliah Boston, MD;  Location: [facility];  Service: Obstetrics;  Laterality: N/A;   NOSE SURGERY      OB History     Gravida  1   Para  1   Term  1   Preterm  0   AB  0   Living  1      SAB  0   IAB  0   Ectopic  0   Multiple  0   Live Births  1            Home Medications    Prior to Admission medications   Medication Sig Start Date End Date Taking? Authorizing Provider  albuterol (VENTOLIN HFA) 108 (90 Base) MCG/ACT inhaler Inhale 1-2 puffs into the lungs every 6 (six) hours as needed for wheezing or shortness of breath. 09/28/21  Yes Marney Setting, NP  predniSONE (STERAPRED UNI-PAK 21 TAB) 10 MG (21) TBPK tablet Take by mouth daily. Take 6 tabs by mouth daily  for 2 days, then 5 tabs for 2 days, then 4 tabs for 2 days, then 3 tabs  for 2 days, 2 tabs for 2 days, then 1 tab by mouth daily for 2 days 09/28/21  Yes Marney Setting, NP  budesonide-formoterol (SYMBICORT) 80-4.5 MCG/ACT inhaler Inhale 2 puffs into the lungs in the morning and at bedtime. with spacer and rinse mouth afterwards. 08/06/20   Garnet Sierras, DO  EPINEPHrine 0.3 mg/0.3 mL IJ SOAJ injection Inject 0.3 mLs (0.3 mg total) into the muscle once as needed for up to 1 dose for anaphylaxis. 04/30/20   Garnet Sierras, DO  levothyroxine (SYNTHROID) 75 MCG tablet TAKE 1 TABLET BY MOUTH DAILY BEFORE BREAKFAST. 10/26/20   Brunetta Jeans, PA-C  Olopatadine HCl 0.2 % SOLN Apply 1 drop to eye daily as needed (itchy/watery eyes). 04/09/20   Garnet Sierras, DO  Prenatal Vit-Fe Fumarate-FA (PRENATAL 1+1 PO) Prenatal    [provider]  rizatriptan (MAXALT) 10 MG tablet TAKE 1 TABLET BY MOUTH AS NEEDED FOR MIGRAINE. MAY REPEAT IN 2 HOURS IF NEEDED 11/11/20   Brunetta Jeans, PA-C  sertraline (ZOLOFT) 25 MG tablet TAKE 1 TABLET(25 MG) BY MOUTH DAILY 09/28/21  Haydee Salter, MD  valACYclovir (VALTREX) 1000 MG tablet Take by mouth. Patient not taking: Reported on 09/02/2021 06/29/21   [provider]    Family History Family History  Problem Relation Age of Onset   Urolithiasis Father    Cancer Mother    Hypertension Mother    Breast cancer Mother 58   Allergic rhinitis Brother    Breast cancer Maternal Grandmother        dx under 102   Stomach cancer Paternal Grandmother        dx in her 91s   Alcohol abuse Maternal Uncle    Prostate cancer Paternal Uncle        mid 59s   Asthma Neg Hx    Eczema Neg Hx    Urticaria Neg Hx     Social History Social History   Tobacco Use   Smoking status: Never   Smokeless tobacco: Never  Vaping Use   Vaping Use: Never used  Substance Use Topics   Alcohol use: Not Currently    Alcohol/week: 0.0 standard drinks   Drug use: No     Allergies   Claritin [loratadine], Latex, and Lexapro  [escitalopram]   Review of Systems Review of Systems  Constitutional:  Positive for appetite change and fever.  HENT:  Positive for congestion, postnasal drip, rhinorrhea, sinus pressure, sinus pain and sneezing.   Eyes: Negative.   Respiratory:  Positive for cough. Negative for shortness of breath.   Cardiovascular: Negative.   Gastrointestinal: Negative.   Genitourinary: Negative.   Neurological: Negative.     Physical Exam Triage Vital Signs ED Triage Vitals  Enc Vitals Group     BP 09/28/21 1540 (!) 143/87     Pulse Rate 09/28/21 1540 95     Resp 09/28/21 1540 18     Temp 09/28/21 1540 100 F (37.8 C)     Temp Source 09/28/21 1540 Oral     SpO2 09/28/21 1540 98 %     Weight --      Height --      Head Circumference --      Peak Flow --      Pain Score 09/28/21 1541 6     Pain Loc --      Pain Edu? --      Excl. in [place]? --    No data found.  Updated Vital Signs BP (!) 143/87 (BP Location: Right Arm)   Pulse 95   Temp 100 F (37.8 C) (Oral)   Resp 18   SpO2 98%   Visual Acuity Right Eye Distance:   Left Eye Distance:   Bilateral Distance:    Right Eye Near:   Left Eye Near:    Bilateral Near:     Physical Exam Constitutional:      Appearance: Normal appearance.  HENT:     Right Ear: Tympanic membrane normal.     Left Ear: Tympanic membrane normal.     Nose: Congestion present.     Mouth/Throat:     Pharynx: Oropharyngeal exudate and posterior oropharyngeal erythema present.  Eyes:     Pupils: Pupils are equal, round, and reactive to light.  Cardiovascular:     Rate and Rhythm: Normal rate.  Pulmonary:     Effort: Pulmonary effort is normal.  Abdominal:     General: Abdomen is flat.  Musculoskeletal:        General: Normal range of motion.     Cervical back: Normal range of  motion.  Skin:    Capillary Refill: Capillary refill takes less than 2 seconds.     Coloration: Skin is jaundiced.  Neurological:     Mental Status: She is alert.      UC Treatments / Results  Labs (all labs ordered are listed, but only abnormal results are displayed) Labs Reviewed  POCT INFLUENZA A/B    EKG   Radiology No results found.  Procedures Procedures (including critical care time)  Medications Ordered in UC Medications - No data to display  Initial Impression / Assessment and Plan / UC Course  I have reviewed the triage vital signs and the nursing notes.  Pertinent labs & imaging results that were available during my care of the patient were reviewed by me and considered in my medical decision making (see chart for details).     Cont to take otc cough and cold meds This seems more viral in nature  Take motrin or tylenol as needed for fever or pain  If symptoms become worse go to er  Use a humidifier to help with the cough  Final Clinical Impressions(s) / UC Diagnoses   Final diagnoses:  Acute cough  Nasal congestion  Viral illness     Discharge Instructions      Cont to take otc cough and cold meds This seems more viral in nature  Take motrin or tylenol as needed for fever or pain  If symptoms become worse go to er  Use a humidifier to help with the cough      ED Prescriptions     Medication Sig Dispense Auth. Provider   predniSONE (STERAPRED UNI-PAK 21 TAB) 10 MG (21) TBPK tablet Take by mouth daily. Take 6 tabs by mouth daily  for 2 days, then 5 tabs for 2 days, then 4 tabs for 2 days, then 3 tabs for 2 days, 2 tabs for 2 days, then 1 tab by mouth daily for 2 days 42 tablet Morley Kos L, NP   albuterol (VENTOLIN HFA) 108 (90 Base) MCG/ACT inhaler Inhale 1-2 puffs into the lungs every 6 (six) hours as needed for wheezing or shortness of breath. 90 g Marney Setting, NP      PDMP not reviewed this encounter.   Marney Setting, NP 09/28/21 1549

## 2021-09-28 NOTE — Discharge Instructions (Addendum)
Cont to take otc cough and cold meds This seems more viral in nature  Take motrin or tylenol as needed for fever or pain  If symptoms become worse go to er  Use a humidifier to help with the cough

## 2021-09-28 NOTE — ED Triage Notes (Signed)
pt report she has body aches, sore throat, pathes on throat, congestion, and ear pain.  Started: 2 days ago

## 2021-10-12 ENCOUNTER — Ambulatory Visit (INDEPENDENT_AMBULATORY_CARE_PROVIDER_SITE_OTHER): Payer: Federal, State, Local not specified - PPO

## 2021-10-12 ENCOUNTER — Other Ambulatory Visit: Payer: Self-pay

## 2021-10-12 DIAGNOSIS — J309 Allergic rhinitis, unspecified: Secondary | ICD-10-CM

## 2021-10-23 ENCOUNTER — Other Ambulatory Visit: Payer: Self-pay | Admitting: Family Medicine

## 2021-10-26 ENCOUNTER — Ambulatory Visit (INDEPENDENT_AMBULATORY_CARE_PROVIDER_SITE_OTHER): Payer: Federal, State, Local not specified - PPO

## 2021-10-26 ENCOUNTER — Other Ambulatory Visit: Payer: Self-pay

## 2021-10-26 DIAGNOSIS — J309 Allergic rhinitis, unspecified: Secondary | ICD-10-CM | POA: Diagnosis not present

## 2021-11-03 ENCOUNTER — Ambulatory Visit: Payer: Federal, State, Local not specified - PPO | Admitting: Family Medicine

## 2021-11-11 ENCOUNTER — Other Ambulatory Visit: Payer: Self-pay

## 2021-11-11 ENCOUNTER — Ambulatory Visit (INDEPENDENT_AMBULATORY_CARE_PROVIDER_SITE_OTHER): Payer: Federal, State, Local not specified - PPO

## 2021-11-11 DIAGNOSIS — J309 Allergic rhinitis, unspecified: Secondary | ICD-10-CM

## 2021-11-25 ENCOUNTER — Other Ambulatory Visit: Payer: Self-pay

## 2021-11-25 ENCOUNTER — Ambulatory Visit (INDEPENDENT_AMBULATORY_CARE_PROVIDER_SITE_OTHER): Payer: Federal, State, Local not specified - PPO

## 2021-11-25 DIAGNOSIS — J309 Allergic rhinitis, unspecified: Secondary | ICD-10-CM

## 2021-12-06 DIAGNOSIS — J302 Other seasonal allergic rhinitis: Secondary | ICD-10-CM | POA: Diagnosis not present

## 2021-12-06 NOTE — Progress Notes (Signed)
VIALS EXP 12-06-22

## 2021-12-09 ENCOUNTER — Other Ambulatory Visit: Payer: Self-pay

## 2021-12-09 ENCOUNTER — Ambulatory Visit (INDEPENDENT_AMBULATORY_CARE_PROVIDER_SITE_OTHER): Payer: Federal, State, Local not specified - PPO

## 2021-12-09 DIAGNOSIS — J309 Allergic rhinitis, unspecified: Secondary | ICD-10-CM | POA: Diagnosis not present

## 2021-12-13 ENCOUNTER — Encounter: Payer: Federal, State, Local not specified - PPO | Admitting: Family Medicine

## 2021-12-14 ENCOUNTER — Encounter: Payer: Federal, State, Local not specified - PPO | Admitting: Family Medicine

## 2021-12-21 ENCOUNTER — Ambulatory Visit (INDEPENDENT_AMBULATORY_CARE_PROVIDER_SITE_OTHER): Payer: Federal, State, Local not specified - PPO

## 2021-12-21 ENCOUNTER — Other Ambulatory Visit: Payer: Self-pay

## 2021-12-21 DIAGNOSIS — J309 Allergic rhinitis, unspecified: Secondary | ICD-10-CM | POA: Diagnosis not present

## 2021-12-28 ENCOUNTER — Other Ambulatory Visit: Payer: Self-pay

## 2021-12-28 ENCOUNTER — Ambulatory Visit (INDEPENDENT_AMBULATORY_CARE_PROVIDER_SITE_OTHER): Payer: Federal, State, Local not specified - PPO

## 2021-12-28 DIAGNOSIS — J309 Allergic rhinitis, unspecified: Secondary | ICD-10-CM | POA: Diagnosis not present

## 2021-12-29 ENCOUNTER — Telehealth: Payer: Self-pay | Admitting: Family Medicine

## 2021-12-29 ENCOUNTER — Encounter: Payer: Self-pay | Admitting: Gastroenterology

## 2021-12-29 ENCOUNTER — Ambulatory Visit (INDEPENDENT_AMBULATORY_CARE_PROVIDER_SITE_OTHER): Payer: Federal, State, Local not specified - PPO | Admitting: Family Medicine

## 2021-12-29 VITALS — BP 128/80 | HR 94 | Temp 97.9°F | Ht 61.0 in | Wt 150.2 lb

## 2021-12-29 DIAGNOSIS — Z8639 Personal history of other endocrine, nutritional and metabolic disease: Secondary | ICD-10-CM

## 2021-12-29 DIAGNOSIS — F411 Generalized anxiety disorder: Secondary | ICD-10-CM | POA: Diagnosis not present

## 2021-12-29 DIAGNOSIS — F5101 Primary insomnia: Secondary | ICD-10-CM

## 2021-12-29 DIAGNOSIS — E039 Hypothyroidism, unspecified: Secondary | ICD-10-CM

## 2021-12-29 DIAGNOSIS — K5909 Other constipation: Secondary | ICD-10-CM | POA: Insufficient documentation

## 2021-12-29 DIAGNOSIS — Z1231 Encounter for screening mammogram for malignant neoplasm of breast: Secondary | ICD-10-CM

## 2021-12-29 DIAGNOSIS — Z1211 Encounter for screening for malignant neoplasm of colon: Secondary | ICD-10-CM

## 2021-12-29 DIAGNOSIS — Z8742 Personal history of other diseases of the female genital tract: Secondary | ICD-10-CM | POA: Insufficient documentation

## 2021-12-29 LAB — COMPREHENSIVE METABOLIC PANEL
ALT: 23 U/L (ref 0–35)
AST: 22 U/L (ref 0–37)
Albumin: 4.9 g/dL (ref 3.5–5.2)
Alkaline Phosphatase: 80 U/L (ref 39–117)
BUN: 11 mg/dL (ref 6–23)
CO2: 32 mEq/L (ref 19–32)
Calcium: 9.6 mg/dL (ref 8.4–10.5)
Chloride: 99 mEq/L (ref 96–112)
Creatinine, Ser: 0.89 mg/dL (ref 0.40–1.20)
GFR: 77.72 mL/min (ref >60.00)
Glucose, Bld: 80 mg/dL (ref 70–99)
Potassium: 4.4 mEq/L (ref 3.5–5.1)
Sodium: 136 mEq/L (ref 135–145)
Total Bilirubin: 1.4 mg/dL — ABNORMAL HIGH (ref 0.2–1.2)
Total Protein: 7.4 g/dL (ref 6.0–8.3)

## 2021-12-29 LAB — VITAMIN D 25 HYDROXY (VIT D DEFICIENCY, FRACTURES): VITD: 32.79 ng/mL (ref 30.00–100.00)

## 2021-12-29 LAB — TSH: TSH: 1.12 u[IU]/mL (ref 0.35–5.50)

## 2021-12-29 LAB — CBC
HCT: 40.2 % (ref 36.0–46.0)
Hemoglobin: 13.6 g/dL (ref 12.0–15.0)
MCHC: 33.9 g/dL (ref 30.0–36.0)
MCV: 91.9 fl (ref 78.0–100.0)
Platelets: 298 10*3/uL (ref 150.0–400.0)
RBC: 4.37 Mil/uL (ref 3.87–5.11)
RDW: 13 % (ref 11.5–15.5)
WBC: 5.2 10*3/uL (ref 4.0–10.5)

## 2021-12-29 LAB — T4, FREE: Free T4: 1.39 ng/dL (ref 0.60–1.60)

## 2021-12-29 MED ORDER — SERTRALINE HCL 50 MG PO TABS: 50.0000 mg | ORAL_TABLET | Freq: Every day | ORAL | 3 refills | Status: DC

## 2021-12-29 MED ORDER — ALPRAZOLAM 0.5 MG PO TABS: 0.5000 mg | ORAL_TABLET | Freq: Two times a day (BID) | ORAL | 1 refills | Status: DC | PRN

## 2021-12-29 NOTE — Progress Notes (Signed)
[facility] Lisa Townsend [place] [address] Dept: [phone] Dept Fax: [phone]  Annual Physical Visit  Subjective:    Patient ID: Lisa Townsend, female    DOB: [birth year], 46 y.o..   MRN: [id number]  Chief Complaint  Patient presents with   Annual Exam    CPE/labs. No concerns.     History of Present Illness:  Patient is in today for an annual physical exam.  Review of Systems  Constitutional:  Negative for chills, diaphoresis, fever, malaise/fatigue and weight loss.  HENT:  Positive for congestion. Negative for ear discharge, ear pain, hearing loss, nosebleeds, sinus pain, sore throat and tinnitus.        Has a history of winter and springtime allergies  Eyes:  Negative for blurred vision, pain, discharge and redness.  Respiratory:  Negative for cough, sputum production, shortness of breath and wheezing.   Cardiovascular:  Negative for chest pain, palpitations and leg swelling.  Gastrointestinal:  Negative for abdominal pain, blood in stool, constipation, diarrhea, heartburn, nausea and vomiting.  Genitourinary:  Negative for dysuria, frequency and hematuria.  Musculoskeletal:  Positive for back pain. Negative for myalgias.       Lower back discomfort associated with holding her newborn son.  Skin:  Negative for itching and rash.  Neurological:  Positive for headaches. Negative for dizziness, tingling, tremors, focal weakness, seizures and weakness.       History of migraines. Well managed with Maxalt.  Psychiatric/Behavioral:  Negative for depression. The patient is nervous/anxious and has insomnia.        History of Generalized anxiety. Lisa Townsend notes she also has periodic panic attacks. She notes she was treated with Valium in the past, however this was a bit strong for her at times. She is currently on sertraline. She also notes a long-standing history of insomnia. She is having difficulty initiating sleep.  She has tried BZDs for this int he past and notes she needs higher doses to have a good effect. she is still needing to get up at night at times to feed her son.   Past Medical History: Patient Active Problem List   Diagnosis Date Noted   Chronic constipation 12/29/2021   History of infertility- ovarian insufficiency 12/29/2021   Seasonal and perennial allergic rhinoconjunctivitis 07/14/2020   Primary insomnia 08/02/2017   Constipation 10/13/2016   Sciatica, right 08/02/2016   H/O vitamin D deficiency 06/08/2016   Generalized anxiety disorder 05/21/2015   Chronic migraine without aura without status migrainosus, not intractable 05/05/2015   Hypothyroidism 01/06/2014   Past Surgical History:  Procedure Laterality Date   BREAST BIOPSY Left    CESAREAN SECTION N/A 08/04/2021   Procedure: CESAREAN SECTION;  Surgeon: Deliah Boston, MD;  Location: [facility];  Service: Obstetrics;  Laterality: N/A;   NOSE SURGERY     Family History  Problem Relation Age of Onset   Urolithiasis Father    Cancer Mother    Hypertension Mother    Breast cancer Mother 70   Allergic rhinitis Brother    Breast cancer Maternal Grandmother        dx under 70   Stomach cancer Paternal Grandmother        dx in her 5s   Alcohol abuse Maternal Uncle    Prostate cancer Paternal Uncle        mid 16s   Asthma Neg Hx    Eczema Neg Hx    Urticaria Neg Hx  Outpatient Medications Prior to Visit  Medication Sig Dispense Refill   albuterol (VENTOLIN HFA) 108 (90 Base) MCG/ACT inhaler Inhale 1-2 puffs into the lungs every 6 (six) hours as needed for wheezing or shortness of breath. 90 g 0   Ascorbic Acid (VITAMIN C) 1000 MG tablet Take 1,000 mg by mouth daily.     cetirizine (ZYRTEC) 10 MG tablet Take 10 mg by mouth daily.     cholecalciferol (VITAMIN D3) 25 MCG (1000 UNIT) tablet Take 1,000 Units by mouth daily.     EPINEPHrine 0.3 mg/0.3 mL IJ SOAJ injection Inject 0.3 mLs (0.3 mg total) into the muscle  once as needed for up to 1 dose for anaphylaxis. 0.3 mL 2   levothyroxine (SYNTHROID) 75 MCG tablet TAKE 1 TABLET BY MOUTH DAILY BEFORE AND BREAKFAST 90 tablet 1   Multiple Vitamin (MULTIVITAMIN) tablet Take 1 tablet by mouth daily.     Olopatadine HCl 0.2 % SOLN Apply 1 drop to eye daily as needed (itchy/watery eyes). 2.5 mL 5   potassium chloride (KLOR-CON) 20 MEQ packet Take by mouth 2 (two) times daily.     rizatriptan (MAXALT) 10 MG tablet TAKE 1 TABLET BY MOUTH AS NEEDED FOR MIGRAINE. MAY REPEAT IN 2 HOURS IF NEEDED 10 tablet 3   vitamin E 1000 UNIT capsule Take 1,000 Units by mouth daily.     vitamin k 100 MCG tablet Take 100 mcg by mouth daily.     sertraline (ZOLOFT) 25 MG tablet TAKE 1 TABLET(25 MG) BY MOUTH DAILY 90 tablet 3   budesonide-formoterol (SYMBICORT) 80-4.5 MCG/ACT inhaler Inhale 2 puffs into the lungs in the morning and at bedtime. with spacer and rinse mouth afterwards. (Patient not taking: Reported on 12/29/2021) 1 each 5   valACYclovir (VALTREX) 1000 MG tablet Take by mouth. (Patient not taking: Reported on 09/02/2021)     predniSONE (STERAPRED UNI-PAK 21 TAB) 10 MG (21) TBPK tablet Take by mouth daily. Take 6 tabs by mouth daily  for 2 days, then 5 tabs for 2 days, then 4 tabs for 2 days, then 3 tabs for 2 days, 2 tabs for 2 days, then 1 tab by mouth daily for 2 days 42 tablet 0   Prenatal Vit-Fe Fumarate-FA (PRENATAL 1+1 PO) Prenatal     No facility-administered medications prior to visit.   Allergies  Allergen Reactions   Claritin [Loratadine]     shakiness   Latex Itching   Lexapro [Escitalopram] Nausea Only     Objective:   Today's Vitals   12/29/21 0945  BP: 128/80  Pulse: 94  Temp: 97.9 F (36.6 C)  TempSrc: Temporal  SpO2: 96%  Weight: 150 lb 3.2 oz (68.1 kg)  Height: 5\' 1"  (1.549 m)   Body mass index is 28.38 kg/m.   General: Well developed, well nourished. No acute distress. HEENT: Normocephalic, non-traumatic. PERRL, EOMI. Conjunctiva clear.  Fundiscopic exam shows normal   disc and vasculature. External ears normal. EAC and TMs normal bilaterally. Nose    clear without congestion or rhinorrhea. Mucous membranes moist. Oropharynx clear. Good dentition. Neck: Supple. No lymphadenopathy. No thyromegaly. Lungs: Clear to auscultation bilaterally. No wheezing, rales or rhonchi. CV: RRR without murmurs or rubs. Pulses 2+ bilaterally. Abdomen: Soft, non-tender. Bowel sounds positive, normal pitch and frequency. No hepatosplenomegaly.   No rebound or guarding. Extremities: Full ROM. No joint swelling or tenderness. Mild crepitance in right shoulder and in left knee. No   edema noted. Skin: Warm and dry. No rashes. Neuro: No  focal neurological deficits. Psych: Alert and oriented. Normal mood and affect.  Health Maintenance Due  Topic Date Due   COLONOSCOPY (Pts 45-85yrs Insurance coverage will need to be confirmed)  Never done   MAMMOGRAM  01/13/2021   Lab Results Last lipids Lab Results  Component Value Date   CHOL 199 11/30/2020   HDL 80.60 11/30/2020   LDLCALC 94 11/30/2020   TRIG 121.0 11/30/2020   CHOLHDL 2 11/30/2020     Assessment & Plan:   1. Generalized anxiety disorder Lisa. Schlottman has anxiety with occasional panic attacks. I will increase her sertraline, now that she is no longer pregnant. We may need to continue to push her dose until her anxiety is improved. I will provide some alprazolam for use for panic attacks.  - sertraline (ZOLOFT) 50 MG tablet; Take 1 tablet (50 mg total) by mouth daily.  Dispense: 90 tablet; Refill: 3 - ALPRAZolam (XANAX) 0.5 MG tablet; Take 1 tablet (0.5 mg total) by mouth 2 (two) times daily as needed for anxiety. May take 1 tablet at bedtime as needed for insomnia.  Dispense: 30 tablet; Refill: 1  2. Acquired hypothyroidism Due for reassessment of hypothyroidism. Plan to continue levothyroxine.  - TSH - T4, free  3. Primary insomnia Lisa. Sliker appears to have a primary issue with  initiating sleep. I am concerned with using anything long-acting, as it may interfere with her responding to her son at night. She can try using a Xanax at bedtime for this.  - ALPRAZolam (XANAX) 0.5 MG tablet; Take 1 tablet (0.5 mg total) by mouth 2 (two) times daily as needed for anxiety. May take 1 tablet at bedtime as needed for insomnia.  Dispense: 30 tablet; Refill: 1  4. H/O vitamin D deficiency Due for reassessment.  - VITAMIN D 25 Hydroxy (Vit-D Deficiency, Fractures)  5. Screening for colon cancer  - Ambulatory referral to Gastroenterology  6. Encounter for screening mammogram for malignant neoplasm of breast  - MM DIGITAL SCREENING BILATERAL; Future  7. Postpartum care and examination Will reassess labs in follow-up to her delivery last Fall.  - Comprehensive metabolic panel - CBC  Return in about 6 weeks (around 02/09/2022).   Haydee Salter, MD

## 2021-12-29 NOTE — Telephone Encounter (Signed)
[facility] requesting a resend of the Sertraline Rx.... The pharmacist said she accidentally deleted it.

## 2021-12-29 NOTE — Telephone Encounter (Signed)
Called pharmacy and they had accidentally deleted the 50 mg Sertraline instead of the 25 mg.  Gave verbal RX to [employer], pharmacist. Dm/cma

## 2022-01-04 ENCOUNTER — Other Ambulatory Visit: Payer: Self-pay

## 2022-01-04 ENCOUNTER — Ambulatory Visit (INDEPENDENT_AMBULATORY_CARE_PROVIDER_SITE_OTHER): Payer: Federal, State, Local not specified - PPO

## 2022-01-04 DIAGNOSIS — J309 Allergic rhinitis, unspecified: Secondary | ICD-10-CM | POA: Diagnosis not present

## 2022-01-06 ENCOUNTER — Encounter: Payer: Self-pay | Admitting: Family Medicine

## 2022-01-06 DIAGNOSIS — F5101 Primary insomnia: Secondary | ICD-10-CM

## 2022-01-10 MED ORDER — RIZATRIPTAN BENZOATE 10 MG PO TABS: ORAL_TABLET | ORAL | 3 refills | Status: DC

## 2022-01-17 ENCOUNTER — Telehealth: Payer: Self-pay

## 2022-01-17 ENCOUNTER — Ambulatory Visit (AMBULATORY_SURGERY_CENTER): Payer: Federal, State, Local not specified - PPO | Admitting: *Deleted

## 2022-01-17 ENCOUNTER — Other Ambulatory Visit: Payer: Self-pay | Admitting: Family Medicine

## 2022-01-17 ENCOUNTER — Other Ambulatory Visit: Payer: Self-pay

## 2022-01-17 VITALS — Ht 61.0 in | Wt 150.0 lb

## 2022-01-17 DIAGNOSIS — Z1211 Encounter for screening for malignant neoplasm of colon: Secondary | ICD-10-CM

## 2022-01-17 DIAGNOSIS — N6489 Other specified disorders of breast: Secondary | ICD-10-CM

## 2022-01-17 DIAGNOSIS — Z09 Encounter for follow-up examination after completed treatment for conditions other than malignant neoplasm: Secondary | ICD-10-CM

## 2022-01-17 MED ORDER — CLENPIQ 10-3.5-12 MG-GM -GM/160ML PO SOLN: 1.0000 | ORAL | 0 refills | Status: DC

## 2022-01-17 MED ORDER — DIAZEPAM 5 MG PO TABS: 2.5000 mg | ORAL_TABLET | Freq: Every evening | ORAL | 1 refills | Status: DC | PRN

## 2022-01-17 NOTE — Progress Notes (Signed)
No egg or soy allergy known to patient  ?No issues known to pt with past sedation with any surgeries or procedures ?Patient denies ever being told they had issues or difficulty with intubation  ?No FH of Malignant Hyperthermia ?Pt is not on diet pills ?Pt is not on  home 02  ?Pt is not on blood thinners  ?Pt states  issues with constipation - has bm;s rarely maybe 2-3 a week - WILL DO A 2 DAY CLENPIQ FOR PT DUE TO HER CONSTIPATION  ?No A fib or A flutter ? ?Pt is fully vaccinated  for Covid  ? ?Clenpiq Coupon to pt in PV today , Code to Pharmacy and  NO PA's for preps discussed with pt In PV today  ?Discussed with pt there will be an out-of-pocket cost for prep and that varies from $0 to 70 +  dollars - pt verbalized understanding  ? ?Due to the COVID-19 pandemic we are asking patients to follow certain guidelines in PV and the [place]   ?Pt aware of COVID protocols and [facility] guidelines  ? ?PV completed over the phone. Pt verified name, DOB, address and insurance during PV today.  ?Pt mailed instruction packet with copy of consent form to read and not return, and instructions.  ?Pt encouraged to call with questions or issues.  ?If pt has My chart, procedure instructions sent via My Chart  ? ?

## 2022-01-17 NOTE — Telephone Encounter (Signed)
Patient called Friday and would like to transfer her allergy vials to our [facility] location. Patient states since [place] is open Monday-Friday it is more convenient for her to get her shots there.  ? ?She will come in to get her injection tomorrow at [facility] office.  ? ?Can we please transfer vials when possible please. Thank you ?

## 2022-01-17 NOTE — Addendum Note (Signed)
Addended by: Haydee Salter on: 01/17/2022 05:56 PM ? ? Modules accepted: Orders ? ?

## 2022-01-18 ENCOUNTER — Ambulatory Visit (INDEPENDENT_AMBULATORY_CARE_PROVIDER_SITE_OTHER): Payer: Federal, State, Local not specified - PPO

## 2022-01-18 DIAGNOSIS — J309 Allergic rhinitis, unspecified: Secondary | ICD-10-CM

## 2022-01-18 NOTE — Telephone Encounter (Signed)
Lisa Townsend, Patient is wanting to transfer her vials to our [place] office after her injection today in [place]. If you could please bring them to the [place] office. Thank you. ?

## 2022-01-19 NOTE — Telephone Encounter (Signed)
Vials were transferred to [facility] office and filed in tray alphabetically. ?

## 2022-01-27 ENCOUNTER — Ambulatory Visit (INDEPENDENT_AMBULATORY_CARE_PROVIDER_SITE_OTHER): Payer: Federal, State, Local not specified - PPO

## 2022-01-27 DIAGNOSIS — J309 Allergic rhinitis, unspecified: Secondary | ICD-10-CM

## 2022-01-31 ENCOUNTER — Encounter: Payer: Self-pay | Admitting: Family Medicine

## 2022-01-31 ENCOUNTER — Ambulatory Visit: Payer: Federal, State, Local not specified - PPO | Admitting: Family Medicine

## 2022-01-31 VITALS — BP 118/74 | HR 90 | Temp 96.9°F | Ht 61.0 in | Wt 157.8 lb

## 2022-01-31 DIAGNOSIS — F411 Generalized anxiety disorder: Secondary | ICD-10-CM

## 2022-01-31 DIAGNOSIS — F5101 Primary insomnia: Secondary | ICD-10-CM

## 2022-01-31 MED ORDER — TRAZODONE HCL 50 MG PO TABS: 50.0000 mg | ORAL_TABLET | Freq: Every day | ORAL | 3 refills | Status: DC

## 2022-01-31 MED ORDER — DIAZEPAM 5 MG PO TABS: 2.5000 mg | ORAL_TABLET | Freq: Every evening | ORAL | 1 refills | Status: DC | PRN

## 2022-01-31 MED ORDER — SERTRALINE HCL 100 MG PO TABS: 100.0000 mg | ORAL_TABLET | Freq: Every day | ORAL | 3 refills | Status: DC

## 2022-01-31 NOTE — Progress Notes (Signed)
?[facility] ?[facility] ?[place] ?[place] [address] ?Dept: [phone] ?Dept Fax: [phone] ? ?Office Visit ? ?Subjective:  ? ? Patient ID: Lisa Townsend, female    DOB: 05-01-1976, 46 y.o..   MRN: [id number] ? ?Chief Complaint  ?Patient presents with  ? Follow-up  ?  6 week f/u. No concerns.  ?  ? ? ?History of Present Illness: ? ?Patient is in today for reassessment of her anxiety issues. Lisa Townsend has a history of generalized anxiety with panic attacks. During her pregnancy, we had been treating her with sertraline 25 mg. She had also noted a long-standing history of insomnia. She was having difficulty initiating sleep. She has tried BZDs for this in the past and noted she needs higher doses to have a good effect. She had used Valium. She is still needing to get up at night at times to feed her son. ? ?At her visit, I increased her sertraline to 50 mg. We did discuss concerns I had about her taking a BZD and then having trouble waking up to feed her son. We agreed to a trial of alprazolam. She contacted me a few weeks later noting some nausea with the alprazolam and that this was not helping her sleep. She was insistent that diazepam worked better for her, so I did agree to a trial of this. ? ?Today, she notes the diazepam does help her initiate sleep better. She is still getting up at night to feed her son. She finds she is a little groggy at these times. She feels her overall mood has improved some with the increase in the sertraline and she appears to be tolerating this okay at this point. ? ?Past Medical History: ?Patient Active Problem List  ? Diagnosis Date Noted  ? Chronic constipation 12/29/2021  ? History of infertility- ovarian insufficiency 12/29/2021  ? Seasonal and perennial allergic rhinoconjunctivitis 07/14/2020  ? Primary insomnia 08/02/2017  ? Constipation 10/13/2016  ? Sciatica, right 08/02/2016  ? H/O vitamin D deficiency 06/08/2016  ?  Generalized anxiety disorder 05/21/2015  ? Chronic migraine without aura without status migrainosus, not intractable 05/05/2015  ? Hypothyroidism 01/06/2014  ? ?Past Surgical History:  ?Procedure Laterality Date  ? BREAST BIOPSY Left   ? CESAREAN SECTION N/A 08/04/2021  ? Procedure: CESAREAN SECTION;  Surgeon: Deliah Boston, MD;  Location: [facility] LD ORS;  Service: Obstetrics;  Laterality: N/A;  ? NOSE SURGERY    ? ?Family History  ?Problem Relation Age of Onset  ? Cancer Mother   ? Hypertension Mother   ? Breast cancer Mother 84  ? Urolithiasis Father   ? Allergic rhinitis Brother   ? Alcohol abuse Maternal Uncle   ? Prostate cancer Paternal Uncle   ?     mid 66s  ? Breast cancer Maternal Grandmother   ?     dx under 89  ? Stomach cancer Paternal Grandmother   ?     dx in her 14s  ? Asthma Neg Hx   ? Eczema Neg Hx   ? Urticaria Neg Hx   ? Colon cancer Neg Hx   ? Colon polyps Neg Hx   ? Esophageal cancer Neg Hx   ? Rectal cancer Neg Hx   ? ?Outpatient Medications Prior to Visit  ?Medication Sig Dispense Refill  ? albuterol (VENTOLIN HFA) 108 (90 Base) MCG/ACT inhaler Inhale 1-2 puffs into the lungs every 6 (six) hours as needed for wheezing or shortness of breath.  90 g 0  ? Ascorbic Acid (VITAMIN C) 1000 MG tablet Take 1,000 mg by mouth daily.    ? budesonide-formoterol (SYMBICORT) 80-4.5 MCG/ACT inhaler Inhale 2 puffs into the lungs in the morning and at bedtime. with spacer and rinse mouth afterwards. 1 each 5  ? cetirizine (ZYRTEC) 10 MG tablet Take 10 mg by mouth daily.    ? cholecalciferol (VITAMIN D3) 25 MCG (1000 UNIT) tablet Take 1,000 Units by mouth daily.    ? EPINEPHrine 0.3 mg/0.3 mL IJ SOAJ injection Inject 0.3 mLs (0.3 mg total) into the muscle once as needed for up to 1 dose for anaphylaxis. 0.3 mL 2  ? Ferrous Sulfate (IRON) 325 (65 Fe) MG TABS Take by mouth.    ? levothyroxine (SYNTHROID) 75 MCG tablet TAKE 1 TABLET BY MOUTH DAILY BEFORE AND BREAKFAST 90 tablet 1  ? Multiple Vitamin (MULTIVITAMIN)  tablet Take 1 tablet by mouth daily.    ? Olopatadine HCl 0.2 % SOLN Apply 1 drop to eye daily as needed (itchy/watery eyes). 2.5 mL 5  ? potassium chloride (KLOR-CON) 20 MEQ packet Take by mouth 2 (two) times daily.    ? rizatriptan (MAXALT) 10 MG tablet TAKE 1 TABLET BY MOUTH AS NEEDED FOR MIGRAINE. MAY REPEAT IN 2 HOURS IF NEEDED 10 tablet 3  ? Sod Picosulfate-Mag Ox-Cit Acd (CLENPIQ) 10-3.5-12 MG-GM -GM/160ML SOLN Take 1 kit by mouth as directed. NO PA's  BIN [id number]  PCN- OHCP  GRP [id number] ID [id number]  NO PA'S 320 mL 0  ? valACYclovir (VALTREX) 1000 MG tablet Take by mouth.    ? vitamin E 1000 UNIT capsule Take 1,000 Units by mouth daily.    ? vitamin k 100 MCG tablet Take 100 mcg by mouth daily.    ? diazepam (VALIUM) 5 MG tablet Take 0.5 tablets (2.5 mg total) by mouth at bedtime as needed (sleep). 15 tablet 1  ? sertraline (ZOLOFT) 50 MG tablet Take 1 tablet (50 mg total) by mouth daily. 90 tablet 3  ? ?No facility-administered medications prior to visit.  ? ?Allergies  ?Allergen Reactions  ? Claritin [Loratadine]   ?  shakiness  ? Latex Itching  ? Lexapro [Escitalopram] Nausea Only  ?   ?Objective:  ? ?Today's Vitals  ? 01/31/22 1034  ?BP: 118/74  ?Pulse: 90  ?Temp: (!) 96.9 ?F (36.1 ?C)  ?TempSrc: Temporal  ?SpO2: 98%  ?Weight: 157 lb 12.8 oz (71.6 kg)  ?Height: _0  (1.549 m)  ? ?Body mass index is 29.82 kg/m?.  ? ?General: Well developed, well nourished. No acute distress. ?Psych: Alert and oriented. Normal mood and affect. ? ?Health Maintenance Due  ?Topic Date Due  ? COLONOSCOPY (Pts 45-28yr Insurance coverage will need to be confirmed)  Never done  ? MAMMOGRAM  01/13/2021  ?   ?Assessment & Plan:  ? ?1. Generalized anxiety disorder ?I recommend we try and increase the dose of the sertraline to see if this will improve the overall management of anxiety. I will reassess Lisa Townsend in 6 weeks. ? ?- sertraline (ZOLOFT) 100 MG tablet; Take 1 tablet (100 mg total) by mouth daily.  Dispense: 30 tablet;  Refill: 3 ? ?2. Primary insomnia ?I will continue the Valium for now. We did discuss that her sleep should improve with better control of her anxiety and when her son is through the current phase and sleeping better at night. In the meantime, I encouraged her to get sleep during the day when she can. ? ?-  diazepam (VALIUM) 5 MG tablet; Take 0.5 tablets (2.5 mg total) by mouth at bedtime as needed (sleep).  Dispense: 15 tablet; Refill: 1 ? ? ?Return in about 6 weeks (around 03/14/2022) for Reassessment.  ? ?Haydee Salter, MD ?

## 2022-02-03 ENCOUNTER — Ambulatory Visit (INDEPENDENT_AMBULATORY_CARE_PROVIDER_SITE_OTHER): Payer: Federal, State, Local not specified - PPO

## 2022-02-03 DIAGNOSIS — J309 Allergic rhinitis, unspecified: Secondary | ICD-10-CM

## 2022-02-04 ENCOUNTER — Encounter: Payer: Self-pay | Admitting: Family Medicine

## 2022-02-07 ENCOUNTER — Ambulatory Visit: Payer: Federal, State, Local not specified - PPO | Admitting: Family Medicine

## 2022-02-07 ENCOUNTER — Encounter: Payer: Self-pay | Admitting: Gastroenterology

## 2022-02-07 ENCOUNTER — Ambulatory Visit (AMBULATORY_SURGERY_CENTER): Payer: Federal, State, Local not specified - PPO | Admitting: Gastroenterology

## 2022-02-07 VITALS — BP 110/68 | HR 75 | Temp 98.2°F | Resp 14 | Ht 61.0 in | Wt 150.0 lb

## 2022-02-07 DIAGNOSIS — K640 First degree hemorrhoids: Secondary | ICD-10-CM

## 2022-02-07 DIAGNOSIS — K635 Polyp of colon: Secondary | ICD-10-CM | POA: Diagnosis not present

## 2022-02-07 DIAGNOSIS — D123 Benign neoplasm of transverse colon: Secondary | ICD-10-CM

## 2022-02-07 DIAGNOSIS — Z1211 Encounter for screening for malignant neoplasm of colon: Secondary | ICD-10-CM | POA: Diagnosis not present

## 2022-02-07 DIAGNOSIS — D125 Benign neoplasm of sigmoid colon: Secondary | ICD-10-CM

## 2022-02-07 MED ORDER — SODIUM CHLORIDE 0.9 % IV SOLN: 500.0000 mL | Freq: Once | INTRAVENOUS | Status: DC

## 2022-02-07 NOTE — Progress Notes (Signed)
? ?GASTROENTEROLOGY PROCEDURE H&P NOTE  ? ?Primary Care Physician: ?Haydee Salter, MD ? ? ? ?Reason for Procedure:  Colon Cancer screening ? ?Plan:    Colonoscopy ? ?Patient is appropriate for endoscopic procedure(s) in the ambulatory ([place]) setting. ? ?The nature of the procedure, as well as the risks, benefits, and alternatives were carefully and thoroughly reviewed with the patient. Ample time for discussion and questions allowed. The patient understood, was satisfied, and agreed to proceed.  ? ? ? ?HPI: ?Lisa Townsend is a 46 y.o. female who presents for colonoscopy for routine Colon Cancer screening.  No active GI symptoms.  No known family history of colon cancer or related malignancy.  Patient is otherwise without complaints or active issues today. ? ?Past Medical History:  ?Diagnosis Date  ? Allergy   ? seasonal- allergy shots weekly to [facility]  ? Anxiety   ? Chronic constipation   ? daily to weekly constipation-  she states has a BM 2-3 x a week  ? Hypothyroidism   ? Migraine   ? Urticaria   ? Vitamin D deficiency   ? ? ?Past Surgical History:  ?Procedure Laterality Date  ? BREAST BIOPSY Left   ? CESAREAN SECTION N/A 08/04/2021  ? Procedure: CESAREAN SECTION;  Surgeon: Deliah Boston, MD;  Location: [facility] LD [facility]S;  Service: Obstetrics;  Laterality: N/A;  ? NOSE SURGERY    ? ? ?Prior to Admission medications   ?Medication Sig Start Date End Date Taking? Authorizing Provider  ?Ascorbic Acid (VITAMIN C) 1000 MG tablet Take 1,000 mg by mouth daily.   Yes [provider]  ?cetirizine (ZYRTEC) 10 MG tablet Take 10 mg by mouth daily.   Yes [provider]  ?cholecalciferol (VITAMIN D3) 25 MCG (1000 UNIT) tablet Take 1,000 Units by mouth daily.   Yes [provider]  ?diazepam (VALIUM) 5 MG tablet Take 0.5 tablets (2.5 mg total) by mouth at bedtime as needed (sleep). 01/31/22  Yes Haydee Salter, MD  ?Ferrous Sulfate (IRON) 325 (65 Fe) MG TABS Take by mouth.   Yes [provider]  ?levothyroxine (SYNTHROID) 75 MCG tablet TAKE 1 TABLET BY MOUTH DAILY BEFORE AND BREAKFAST 10/24/21  Yes Haydee Salter, MD  ?Multiple Vitamin (MULTIVITAMIN) tablet Take 1 tablet by mouth daily.   Yes [provider]  ?Olopatadine HCl 0.2 % SOLN Apply 1 drop to eye daily as needed (itchy/watery eyes). 04/09/20  Yes Garnet Sierras, DO  ?potassium chloride (KLOR-CON) 20 MEQ packet Take by mouth 2 (two) times daily.   Yes [provider]  ?rizatriptan (MAXALT) 10 MG tablet TAKE 1 TABLET BY MOUTH AS NEEDED FOR MIGRAINE. MAY REPEAT IN 2 HOURS IF NEEDED 01/10/22  Yes Haydee Salter, MD  ?sertraline (ZOLOFT) 100 MG tablet Take 1 tablet (100 mg total) by mouth daily. 01/31/22  Yes Haydee Salter, MD  ?vitamin E 1000 UNIT capsule Take 1,000 Units by mouth daily.   Yes [provider]  ?vitamin k 100 MCG tablet Take 100 mcg by mouth daily.   Yes [provider]  ?albuterol (VENTOLIN HFA) 108 (90 Base) MCG/ACT inhaler Inhale 1-2 puffs into the lungs every 6 (six) hours as needed for wheezing or shortness of breath. 09/28/21   Marney Setting, NP  ?budesonide-formoterol (SYMBICORT) 80-4.5 MCG/ACT inhaler Inhale 2 puffs into the lungs in the morning and at bedtime. with spacer and rinse mouth afterwards. ?Patient not taking: Reported on 02/07/2022 08/06/20   Rexene Alberts  M, DO  ?EPINEPHrine 0.3 mg/0.3 mL IJ SOAJ injection Inject 0.3 mLs (0.3 mg total) into the muscle once as needed for up to 1 dose for anaphylaxis. 04/30/20   Garnet Sierras, DO  ?traZODone (DESYREL) 50 MG tablet Take 1 tablet (50 mg total) by mouth at bedtime. 01/31/22   Haydee Salter, MD  ?valACYclovir (VALTREX) 1000 MG tablet Take by mouth. ?Patient not taking: Reported on 02/07/2022 06/29/21   [provider]  ? ? ?Current Outpatient Medications  ?Medication Sig Dispense Refill  ? Ascorbic Acid (VITAMIN C) 1000 MG tablet Take 1,000 mg by mouth daily.    ? cetirizine (ZYRTEC) 10 MG tablet Take 10 mg by mouth daily.    ?  cholecalciferol (VITAMIN D3) 25 MCG (1000 UNIT) tablet Take 1,000 Units by mouth daily.    ? diazepam (VALIUM) 5 MG tablet Take 0.5 tablets (2.5 mg total) by mouth at bedtime as needed (sleep). 15 tablet 1  ? Ferrous Sulfate (IRON) 325 (65 Fe) MG TABS Take by mouth.    ? levothyroxine (SYNTHROID) 75 MCG tablet TAKE 1 TABLET BY MOUTH DAILY BEFORE AND BREAKFAST 90 tablet 1  ? Multiple Vitamin (MULTIVITAMIN) tablet Take 1 tablet by mouth daily.    ? Olopatadine HCl 0.2 % SOLN Apply 1 drop to eye daily as needed (itchy/watery eyes). 2.5 mL 5  ? potassium chloride (KLOR-CON) 20 MEQ packet Take by mouth 2 (two) times daily.    ? rizatriptan (MAXALT) 10 MG tablet TAKE 1 TABLET BY MOUTH AS NEEDED FOR MIGRAINE. MAY REPEAT IN 2 HOURS IF NEEDED 10 tablet 3  ? sertraline (ZOLOFT) 100 MG tablet Take 1 tablet (100 mg total) by mouth daily. 30 tablet 3  ? vitamin E 1000 UNIT capsule Take 1,000 Units by mouth daily.    ? vitamin k 100 MCG tablet Take 100 mcg by mouth daily.    ? albuterol (VENTOLIN HFA) 108 (90 Base) MCG/ACT inhaler Inhale 1-2 puffs into the lungs every 6 (six) hours as needed for wheezing or shortness of breath. 90 g 0  ? budesonide-formoterol (SYMBICORT) 80-4.5 MCG/ACT inhaler Inhale 2 puffs into the lungs in the morning and at bedtime. with spacer and rinse mouth afterwards. (Patient not taking: Reported on 02/07/2022) 1 each 5  ? EPINEPHrine 0.3 mg/0.3 mL IJ SOAJ injection Inject 0.3 mLs (0.3 mg total) into the muscle once as needed for up to 1 dose for anaphylaxis. 0.3 mL 2  ? traZODone (DESYREL) 50 MG tablet Take 1 tablet (50 mg total) by mouth at bedtime. 30 tablet 3  ? valACYclovir (VALTREX) 1000 MG tablet Take by mouth. (Patient not taking: Reported on 02/07/2022)    ? ?Current Facility-Administered Medications  ?Medication Dose Route Frequency Provider Last Rate Last Admin  ? 0.9 %  sodium chloride infusion  500 mL Intravenous Once Kholton Coate V, DO      ? ? ?Allergies as of 02/07/2022 - Review Complete  02/07/2022  ?Allergen Reaction Noted  ? Claritin [loratadine]  06/26/2017  ? Latex Itching 08/04/2021  ? Lexapro [escitalopram] Nausea Only 05/21/2015  ? ? ?Family History  ?Problem Relation Age of Onset  ? Cancer Mother   ? Hypertension Mother   ? Breast cancer Mother 46  ? Urolithiasis Father   ? Allergic rhinitis Brother   ? Alcohol abuse Maternal Uncle   ? Prostate cancer Paternal Uncle   ?     mid 20s  ? Breast cancer Maternal Grandmother   ?  dx under 50  ? Stomach cancer Paternal Grandmother   ?     dx in her 51s  ? Asthma Neg Hx   ? Eczema Neg Hx   ? Urticaria Neg Hx   ? Colon cancer Neg Hx   ? Colon polyps Neg Hx   ? Esophageal cancer Neg Hx   ? Rectal cancer Neg Hx   ? ? ?Social History  ? ?Socioeconomic History  ? Marital status: Married  ?  Spouse name: Delfino Lovett  ? Number of children: 0  ? Years of education: Not on file  ? Highest education level: Not on file  ?Occupational History  ? Not on file  ?Tobacco Use  ? Smoking status: Never  ? Smokeless tobacco: Never  ?Vaping Use  ? Vaping Use: Never used  ?Substance and Sexual Activity  ? Alcohol use: Not Currently  ?  Alcohol/week: 0.0 standard drinks  ? Drug use: No  ? Sexual activity: Yes  ?  Partners: Male  ?  Comment: husband with vasectomy  ?Other Topics Concern  ? Not on file  ?Social History Narrative  ? Not on file  ? ?Social Determinants of Health  ? ?Financial Resource Strain: Not on file  ?Food Insecurity: Not on file  ?Transportation Needs: Not on file  ?Physical Activity: Not on file  ?Stress: Not on file  ?Social Connections: Not on file  ?Intimate Partner Violence: Not on file  ? ? ?Physical Exam: ?Vital signs in last 24 hours: ?'@BP'$  118/71   Pulse 68   Temp 98.2 ?F (36.8 ?C)   Ht '5\' 1"'$  (1.549 m)   Wt 150 lb (68 kg)   LMP 01/28/2022   SpO2 100%   BMI 28.34 kg/m?  ?GEN: NAD ?EYE: Sclerae anicteric ?ENT: MMM ?CV: Non-tachycardic ?Pulm: CTA b/l ?GI: Soft, NT/ND ?NEURO:  Alert & Oriented x 3 ? ? ?Gerrit Heck, DO ?[facility] ? ? ?02/07/2022 7:54 AM ? ?

## 2022-02-07 NOTE — Progress Notes (Signed)
Vss nad transferred to pacu 

## 2022-02-07 NOTE — Progress Notes (Signed)
Cell phone off per pt Pt's states no medical or surgical changes since previsit or office visit.  

## 2022-02-07 NOTE — Patient Instructions (Signed)
Handouts on hemorrhoids and polyps given to patient. ?Await pathology results. ?Resume previous diet and continue present medications. ?Repeat colonoscopy for surveillance will be determined based off pathology results. ? ? ?YOU HAD AN ENDOSCOPIC PROCEDURE TODAY AT [facility]:   Refer to the procedure report that was given to you for any specific questions about what was found during the examination.  If the procedure report does not answer your questions, please call your gastroenterologist to clarify.  If you requested that your care partner not be given the details of your procedure findings, then the procedure report has been included in a sealed envelope for you to review at your convenience later. ? ?YOU SHOULD EXPECT: Some feelings of bloating in the abdomen. Passage of more gas than usual.  Walking can help get rid of the air that was put into your GI tract during the procedure and reduce the bloating. If you had a lower endoscopy (such as a colonoscopy or flexible sigmoidoscopy) you may notice spotting of blood in your stool or on the toilet paper. If you underwent a bowel prep for your procedure, you may not have a normal bowel movement for a few days. ? ?Please Note:  You might notice some irritation and congestion in your nose or some drainage.  This is from the oxygen used during your procedure.  There is no need for concern and it should clear up in a day or so. ? ?SYMPTOMS TO REPORT IMMEDIATELY: ? ?Following lower endoscopy (colonoscopy or flexible sigmoidoscopy): ? Excessive amounts of blood in the stool ? Significant tenderness or worsening of abdominal pains ? Swelling of the abdomen that is new, acute ? Fever of 100?F or higher ? ?For urgent or emergent issues, a gastroenterologist can be reached at any hour by calling [phone]. ?Do not use MyChart messaging for urgent concerns.  ? ? ?DIET:  We do recommend a small meal at first, but then you may proceed to your regular  diet.  Drink plenty of fluids but you should avoid alcoholic beverages for 24 hours. ? ?ACTIVITY:  You should plan to take it easy for the rest of today and you should NOT DRIVE or use heavy machinery until tomorrow (because of the sedation medicines used during the test).   ? ?FOLLOW UP: ?Our staff will call the number listed on your records 48-72 hours following your procedure to check on you and address any questions or concerns that you may have regarding the information given to you following your procedure. If we do not reach you, we will leave a message.  We will attempt to reach you two times.  During this call, we will ask if you have developed any symptoms of COVID 19. If you develop any symptoms (ie: fever, flu-like symptoms, shortness of breath, cough etc.) before then, please call [phone].  If you test positive for Covid 19 in the 2 weeks post procedure, please call and report this information to [place].   ? ?If any biopsies were taken you will be contacted by phone or by letter within the next 1-3 weeks.  Please call us at [phone] if you have not heard about the biopsies in 3 weeks.  ? ? ?SIGNATURES/CONFIDENTIALITY: ?You and/or your care partner have signed paperwork which will be entered into your electronic medical record.  These signatures attest to the fact that that the information above on your After Visit Summary has been reviewed and is understood.  Full responsibility of the  confidentiality of this discharge information lies with you and/or your care-partner.  ?

## 2022-02-07 NOTE — Op Note (Signed)
[place] ?Patient Name: Lisa Townsend ?Procedure Date: 02/07/2022 7:55 AM ?MRN: [id number] ?Endoscopist: Gerrit Heck , MD ?Age: 46 ?Referring MD:  ?Date of Birth: 18-Dec-1975 ?Gender: Female ?Account #: [id number] ?Procedure:                Colonoscopy ?Indications:              Screening for colorectal malignant neoplasm, This  ?                          is the patient's first colonoscopy ?Medicines:                Monitored Anesthesia Care ?Procedure:                Pre-Anesthesia Assessment: ?                          - Prior to the procedure, a History and Physical  ?                          was performed, and patient medications and  ?                          allergies were reviewed. The patient's tolerance of  ?                          previous anesthesia was also reviewed. The risks  ?                          and benefits of the procedure and the sedation  ?                          options and risks were discussed with the patient.  ?                          All questions were answered, and informed consent  ?                          was obtained. Prior Anticoagulants: The patient has  ?                          taken no previous anticoagulant or antiplatelet  ?                          agents. ASA Grade Assessment: II - A patient with  ?                          mild systemic disease. After reviewing the risks  ?                          and benefits, the patient was deemed in  ?                          satisfactory condition to undergo the procedure. ?  After obtaining informed consent, the colonoscope  ?                          was passed under direct vision. Throughout the  ?                          procedure, the patient's blood pressure, pulse, and  ?                          oxygen saturations were monitored continuously. The  ?                          Olympus CF-HQ190L (Serial# [id number]) Colonoscope was  ?                          introduced through the  anus and advanced to the the  ?                          terminal ileum. The colonoscopy was performed  ?                          without difficulty. The patient tolerated the  ?                          procedure well. The quality of the bowel  ?                          preparation was good. The terminal ileum, ileocecal  ?                          valve, appendiceal orifice, and rectum were  ?                          photographed. ?Scope In: 8:01:29 AM ?Scope Out: 8:23:57 AM ?Scope Withdrawal Time: 0 hours 17 minutes 52 seconds  ?Total Procedure Duration: 0 hours 22 minutes 28 seconds  ?Findings:                 The perianal and digital rectal examinations were  ?                          normal. ?                          Two sessile polyps were found in the distal sigmoid  ?                          colon and transverse colon. The polyps were 2 to 4  ?                          mm in size. These polyps were removed with a cold  ?                          snare. Resection and retrieval were complete.  ?  Estimated blood loss was minimal. ?                          The exam was otherwise normal throughout the  ?                          remainder of the colon. ?                          Non-bleeding internal hemorrhoids were found during  ?                          retroflexion. The hemorrhoids were small. ?                          The terminal ileum appeared normal. ?Complications:            No immediate complications. ?Estimated Blood Loss:     Estimated blood loss was minimal. ?Impression:               - Two 2 to 4 mm polyps in the distal sigmoid colon  ?                          and in the transverse colon, removed with a cold  ?                          snare. Resected and retrieved. ?                          - Non-bleeding internal hemorrhoids. ?                          - The examined portion of the ileum was normal. ?Recommendation:           - Patient has a contact number  available for  ?                          emergencies. The signs and symptoms of potential  ?                          delayed complications were discussed with the  ?                          patient. Return to normal activities tomorrow.  ?                          Written discharge instructions were provided to the  ?                          patient. ?                          - Resume previous diet. ?                          - Continue present medications. ?                          -  Await pathology results. ?                          - Repeat colonoscopy for surveillance based on  ?                          pathology results. ?                          - Return to GI office PRN. ?Gerrit Heck, MD ?02/07/2022 8:28:47 AM ?

## 2022-02-08 ENCOUNTER — Ambulatory Visit
Admission: RE | Admit: 2022-02-08 | Discharge: 2022-02-08 | Disposition: A | Discharge status: Home / Self Care | Payer: Federal, State, Local not specified - PPO | Source: Ambulatory Visit | Attending: Family Medicine | Admitting: Family Medicine

## 2022-02-08 ENCOUNTER — Ambulatory Visit: Payer: Federal, State, Local not specified - PPO

## 2022-02-08 DIAGNOSIS — N6489 Other specified disorders of breast: Secondary | ICD-10-CM

## 2022-02-08 DIAGNOSIS — R922 Inconclusive mammogram: Secondary | ICD-10-CM | POA: Diagnosis not present

## 2022-02-08 DIAGNOSIS — Z09 Encounter for follow-up examination after completed treatment for conditions other than malignant neoplasm: Secondary | ICD-10-CM

## 2022-02-09 ENCOUNTER — Telehealth: Payer: Self-pay | Admitting: *Deleted

## 2022-02-09 ENCOUNTER — Telehealth: Payer: Self-pay

## 2022-02-09 NOTE — Telephone Encounter (Signed)
?  Follow up Call- ? ? ?  02/07/2022  ?  7:14 AM  ?Call back number  ?Post procedure Call Back phone  # [phone] VM not set up  ?Permission to leave phone message No  ?  ?No answer at 2nd attempt follow up phone call.  Unable to leave a message d/t no VM.  ?

## 2022-02-09 NOTE — Telephone Encounter (Signed)
?  Follow up Call- ? ? ?  02/07/2022  ?  7:14 AM  ?Call back number  ?Post procedure Call Back phone  # [phone] VM not set up  ?Permission to leave phone message No  ?  ? ?1st follow up call made.  NAULM.  VM not set up ? ?

## 2022-02-22 ENCOUNTER — Encounter: Payer: Self-pay | Admitting: Gastroenterology

## 2022-02-25 ENCOUNTER — Ambulatory Visit (INDEPENDENT_AMBULATORY_CARE_PROVIDER_SITE_OTHER): Payer: Federal, State, Local not specified - PPO | Admitting: *Deleted

## 2022-02-25 DIAGNOSIS — J309 Allergic rhinitis, unspecified: Secondary | ICD-10-CM

## 2022-03-14 ENCOUNTER — Ambulatory Visit: Payer: Federal, State, Local not specified - PPO | Admitting: Family Medicine

## 2022-03-15 ENCOUNTER — Ambulatory Visit (INDEPENDENT_AMBULATORY_CARE_PROVIDER_SITE_OTHER): Payer: Federal, State, Local not specified - PPO

## 2022-03-15 DIAGNOSIS — J309 Allergic rhinitis, unspecified: Secondary | ICD-10-CM

## 2022-03-22 NOTE — Progress Notes (Signed)
VIALS EXP 03-23-23 ?

## 2022-03-23 DIAGNOSIS — J302 Other seasonal allergic rhinitis: Secondary | ICD-10-CM | POA: Diagnosis not present

## 2022-03-29 ENCOUNTER — Ambulatory Visit: Payer: Federal, State, Local not specified - PPO | Admitting: Allergy

## 2022-04-06 ENCOUNTER — Ambulatory Visit: Payer: Federal, State, Local not specified - PPO | Admitting: Internal Medicine

## 2022-04-11 ENCOUNTER — Ambulatory Visit (INDEPENDENT_AMBULATORY_CARE_PROVIDER_SITE_OTHER): Payer: Federal, State, Local not specified - PPO

## 2022-04-11 DIAGNOSIS — J309 Allergic rhinitis, unspecified: Secondary | ICD-10-CM

## 2022-04-22 ENCOUNTER — Other Ambulatory Visit: Payer: Self-pay | Admitting: Family Medicine

## 2022-04-26 ENCOUNTER — Ambulatory Visit: Payer: Federal, State, Local not specified - PPO | Admitting: Family Medicine

## 2022-04-26 ENCOUNTER — Ambulatory Visit: Payer: Federal, State, Local not specified - PPO | Admitting: Allergy & Immunology

## 2022-04-28 NOTE — Progress Notes (Signed)
Error

## 2022-05-05 ENCOUNTER — Ambulatory Visit: Payer: Self-pay

## 2022-05-05 ENCOUNTER — Encounter: Payer: Self-pay | Admitting: Allergy & Immunology

## 2022-05-05 ENCOUNTER — Ambulatory Visit: Payer: Federal, State, Local not specified - PPO | Admitting: Allergy & Immunology

## 2022-05-05 VITALS — BP 112/62 | HR 65 | Temp 98.1°F | Resp 16 | Wt 143.0 lb

## 2022-05-05 DIAGNOSIS — H1013 Acute atopic conjunctivitis, bilateral: Secondary | ICD-10-CM

## 2022-05-05 DIAGNOSIS — J302 Other seasonal allergic rhinitis: Secondary | ICD-10-CM | POA: Diagnosis not present

## 2022-05-05 DIAGNOSIS — J309 Allergic rhinitis, unspecified: Secondary | ICD-10-CM | POA: Diagnosis not present

## 2022-05-05 DIAGNOSIS — R0602 Shortness of breath: Secondary | ICD-10-CM

## 2022-05-05 DIAGNOSIS — H1010 Acute atopic conjunctivitis, unspecified eye: Secondary | ICD-10-CM

## 2022-05-05 DIAGNOSIS — J3089 Other allergic rhinitis: Secondary | ICD-10-CM | POA: Diagnosis not present

## 2022-05-05 NOTE — Progress Notes (Signed)
FOLLOW UP  Date of Service/Encounter:  05/08/22   Assessment:   Seasonal and perennial allergic rhinoconjunctivitis (grass, weed, ragweed, trees, mold) - with maintenance reached November 2021  Shortness of breathing following COVID19 infection - resolved (previously on Symbicort BID)  Plan/Recommendations:   Environmental allergies Past skin testing showed: positive to grass, weed, ragweed, trees, mold.  We are going to get some blood work to make sure we are not missing anything at all. We will call you in 1-2 weeks with the results of the testing. We may use these results to change your vial prescriptions a bit. Continue with cetirizine daily.  Hopefully with the blood work, we will be able to make other plans.   2. Return in about 1 year (around 05/06/2023).   Subjective:   Lisa Townsend is a 46 y.o. female presenting today for follow up of  Chief Complaint  Patient presents with   Follow-up    The same. Don't feel much improvement.    Lisa Townsend has a history of the following: Patient Active Problem List   Diagnosis Date Noted   Chronic constipation 12/29/2021   History of infertility- ovarian insufficiency 12/29/2021   Seasonal and perennial allergic rhinoconjunctivitis 07/14/2020   Primary insomnia 08/02/2017   Constipation 10/13/2016   Sciatica, right 08/02/2016   H/O vitamin D deficiency 06/08/2016   Generalized anxiety disorder 05/21/2015   Chronic migraine without aura without status migrainosus, not intractable 05/05/2015   Hypothyroidism 01/06/2014    History obtained from: chart review and patient.  Lisa Townsend is a 46 y.o. female presenting for a follow up visit. She is followed by Dr. Maudie Mercury and was last seen in March 2022. At that visit, she was wndorsing symptoms of SOB. Spirometry was improved. She was continued on Symbicort 42mg two puffs BID. For her S/PAR, she was continue on allergy injections as well as an OTC antihistamine as well as a  nasal saline rinse and Rhinocort. Allergy shots were frozen because of her pregnancy.   Since the last visit, she has not done well. She did have her now 957monfant and she is growing very well.   However, she reports that she has been on shots for 2 years and does not feel that they have helped at all. She has been on shots for two years. She does not feel that they are working very  well. She was frozen for a while and she started increasing in October. She is not feeling any different. She takes an alleryg pill nightly.  Summer seems to be worse. She does have a lot of sneezing. She has itchy eyes and runny nose. Allergy shots have not seemed to have helped. She is feeling rather hopeless. She does not like nose sprays and does not use them routinely.   She has been on prednisone in the past for her sinus issues. She reports that this did help in the past. She has sene ENT in the past. She trells me that she saw Dr. NeLucia Gaskinsext door. She was evaluated for anosmia following COVID19 infection that never resolved. Use of training smells was helpful for him.   Asthma/Respiratory Symptom History: She denies having asthma. She was having some SOB after she had COVID. She does not use Symbicort on a routine basis at all. She does not feel that it does much at this point. She had COVID back at the end of 2020.  She reports that her SOB is largely improved thankfully at  this point in time.   Lisa Townsend is on allergen immunotherapy. She receives two injections. Immunotherapy script #1 contains  ragweed, trees, and grasses. She currently receives 0.95m of the RED vial (1/100). Immunotherapy script #2 contains molds. She currently receives 0.554mof the RED vial (1/100). She started shots June of 2021 and reached maintenance in November of 2021.  Otherwise, there have been no changes to her past medical history, surgical history, family history, or social history.    Review of Systems  Constitutional:  Negative.  Negative for chills, fever, malaise/fatigue and weight loss.  HENT: Negative.  Negative for congestion, ear discharge, ear pain and sinus pain.   Eyes:  Negative for pain, discharge and redness.  Respiratory:  Negative for cough, sputum production, shortness of breath and wheezing.   Cardiovascular: Negative.  Negative for chest pain and palpitations.  Gastrointestinal:  Negative for abdominal pain, constipation, diarrhea, heartburn, nausea and vomiting.  Skin: Negative.  Negative for itching and rash.  Neurological:  Negative for dizziness and headaches.  Endo/Heme/Allergies:  Positive for environmental allergies. Does not bruise/bleed easily.       Objective:   Blood pressure 112/62, pulse 65, temperature 98.1 F (36.7 C), temperature source Temporal, resp. rate 16, weight 143 lb (64.9 kg), SpO2 98 %, not currently breastfeeding. Body mass index is 27.02 kg/m.    Physical Exam Vitals reviewed.  Constitutional:      Appearance: She is well-developed.     Comments: Seems slightly down.   HENT:     Head: Normocephalic and atraumatic.     Right Ear: Tympanic membrane, ear canal and external ear normal.     Left Ear: Tympanic membrane, ear canal and external ear normal.     Nose: No nasal deformity, septal deviation, mucosal edema or rhinorrhea.     Right Turbinates: Enlarged, swollen and pale.     Left Turbinates: Enlarged, swollen and pale.     Right Sinus: No maxillary sinus tenderness or frontal sinus tenderness.     Left Sinus: No maxillary sinus tenderness or frontal sinus tenderness.     Comments: No nasal polyps noted.     Mouth/Throat:     Lips: Pink.     Mouth: Mucous membranes are moist. Mucous membranes are not pale and not dry.     Pharynx: Uvula midline.  Eyes:     General: Lids are normal. Allergic shiner present.        Right eye: No discharge.        Left eye: No discharge.     Conjunctiva/sclera: Conjunctivae normal.     Right eye: Right  conjunctiva is not injected. No chemosis.    Left eye: Left conjunctiva is not injected. No chemosis.    Pupils: Pupils are equal, round, and reactive to light.  Cardiovascular:     Rate and Rhythm: Normal rate and regular rhythm.     Heart sounds: Normal heart sounds.  Pulmonary:     Effort: Pulmonary effort is normal. No tachypnea, accessory muscle usage or respiratory distress.     Breath sounds: Normal breath sounds. No wheezing, rhonchi or rales.     Comments: Moving air well in all lung fields. No increased work of breathing noted.  Chest:     Chest wall: No tenderness.  Lymphadenopathy:     Cervical: No cervical adenopathy.  Skin:    Coloration: Skin is not pale.     Findings: No abrasion, erythema, petechiae or rash. Rash is not papular, urticarial  or vesicular.  Neurological:     Mental Status: She is alert.  Psychiatric:        Behavior: Behavior is cooperative.      Diagnostic studies: none      Salvatore Marvel, MD  [facility]

## 2022-05-05 NOTE — Patient Instructions (Addendum)
Environmental allergies Past skin testing showed: Positive to grass, weed, ragweed, trees, mold.  We are going to get some blood work to make sure we are not missing anything at all. We will call you in 1-2 weeks with the results of the testing. We may use these results to change your vial prescriptions a bit. Continue with cetirizine daily.  Hopefully with the blood work, we will be able to make other plans.   2. Return in about 1 year (around 05/06/2023).    Please inform us of any Emergency Department visits, hospitalizations, or changes in symptoms. Call us before going to the ED for breathing or allergy symptoms since we might be able to fit you in for a sick visit. Feel free to contact us anytime with any questions, problems, or concerns.  It was a pleasure to meet you today!  Websites that have reliable patient information: 1. American Academy of Asthma, Allergy, and Immunology: www.aaaai.org 2. Food Allergy Research and Education (FARE): foodallergy.org 3. Mothers of Asthmatics: http://www.asthmacommunitynetwork.org 4. American College of Allergy, Asthma, and Immunology: www.acaai.org   COVID-19 Vaccine Information can be found at: ShippingScam.co.uk For questions related to vaccine distribution or appointments, please email [email or web address] or call [phone].   We realize that you might be concerned about having an allergic reaction to the COVID19 vaccines. To help with that concern, WE ARE OFFERING THE COVID19 VACCINES IN OUR OFFICE! Ask the front desk for dates!     "Like" [place] on Facebook and Instagram for our latest updates!      A healthy democracy works best when [employer] participate! Make sure you are registered to vote! If you have moved or changed any of your contact information, you will need to get this updated before voting!  In some cases, you MAY be able to register to vote online:  [email or web address]

## 2022-05-09 LAB — ALLERGEN PROFILE, MOLD
Aureobasidi Pullulans IgE: <0.1 kU/L
Candida Albicans IgE: <0.1 kU/L
M009-IgE Fusarium proliferatum: <0.1 kU/L
M014-IgE Epicoccum purpur: <0.1 kU/L
Mucor Racemosus IgE: <0.1 kU/L
Phoma Betae IgE: <0.1 kU/L
Setomelanomma Rostrat: <0.1 kU/L
Stemphylium Herbarum IgE: <0.1 kU/L

## 2022-05-09 LAB — ALLERGENS W/COMP RFLX AREA 2
Alternaria Alternata IgE: <0.1 kU/L
Aspergillus Fumigatus IgE: <0.1 kU/L
Bermuda Grass IgE: 0.1 kU/L — AB
Cedar, Mountain IgE: <0.1 kU/L
Cladosporium Herbarum IgE: <0.1 kU/L
Cockroach, German IgE: <0.1 kU/L
Common Silver Birch IgE: 0.75 kU/L — AB
Cottonwood IgE: <0.1 kU/L
D Farinae IgE: <0.1 kU/L
D Pteronyssinus IgE: <0.1 kU/L
E001-IgE Cat Dander: 17.6 kU/L — AB
E005-IgE Dog Dander: 1.68 kU/L — AB
Elm, American IgE: <0.1 kU/L
IgE (Immunoglobulin E), Serum: 85 IU/mL (ref 6–495)
Johnson Grass IgE: 0.35 kU/L — AB
Maple/Box Elder IgE: <0.1 kU/L
Mouse Urine IgE: <0.1 kU/L
Oak, White IgE: 3.99 kU/L — AB
Pecan, Hickory IgE: <0.1 kU/L
Penicillium Chrysogen IgE: <0.1 kU/L
Pigweed, Rough IgE: <0.1 kU/L
Ragweed, Short IgE: <0.1 kU/L
Sheep Sorrel IgE Qn: <0.1 kU/L
Timothy Grass IgE: 1.31 kU/L — AB
White Mulberry IgE: <0.1 kU/L

## 2022-05-09 LAB — PANEL 606648
E101-IgE Can f 1: 3.99 kU/L — AB
E102-IgE Can f 2: <0.1 kU/L
E221-IgE Can f 3: <0.1 kU/L
E226-IgE Can f 5: <0.1 kU/L

## 2022-05-09 LAB — PANEL 606578
E094-IgE Fel d 1: 11.2 kU/L — AB
E220-IgE Fel d 2: <0.1 kU/L
E228-IgE Fel d 4: 4.65 kU/L — AB

## 2022-05-09 LAB — ALLERGEN COMPONENT COMMENTS

## 2022-05-11 ENCOUNTER — Encounter: Payer: Self-pay | Admitting: Allergy & Immunology

## 2022-05-13 ENCOUNTER — Telehealth: Payer: Self-pay

## 2022-05-13 NOTE — Telephone Encounter (Signed)
Patient called in - DOB verified - stated she sent a myChart message to provider on 05/31/22 and had not heard  anything. Patient was advised provider is out of the office until Tuesday, 05/17/22 - I can forward her message to another provider for review - patient declined.  Patient stated that per her lab results she highly allergic to cats - she has a cat in the home, that she's had since the cat was a kitten and can't get remove it from the home. Patient asked if there were allergy injections or anything for cats.  Patient was advised cat could possibly be added as an allergen to one her vials or she may have to have a 3rd injection - depends on provider recommendation once he reviews her present allergens, what next step is best to take.  Patient advised message will be forwarded to provider, once he responds, she'll be contacted with update. Patient verbalized understanding, no further questions.

## 2022-05-27 ENCOUNTER — Ambulatory Visit (INDEPENDENT_AMBULATORY_CARE_PROVIDER_SITE_OTHER): Payer: Federal, State, Local not specified - PPO | Admitting: *Deleted

## 2022-05-27 DIAGNOSIS — J309 Allergic rhinitis, unspecified: Secondary | ICD-10-CM | POA: Diagnosis not present

## 2022-06-14 ENCOUNTER — Other Ambulatory Visit: Payer: Self-pay | Admitting: Family Medicine

## 2022-06-14 DIAGNOSIS — F411 Generalized anxiety disorder: Secondary | ICD-10-CM

## 2022-06-21 ENCOUNTER — Ambulatory Visit (INDEPENDENT_AMBULATORY_CARE_PROVIDER_SITE_OTHER): Payer: Federal, State, Local not specified - PPO

## 2022-06-21 DIAGNOSIS — H1010 Acute atopic conjunctivitis, unspecified eye: Secondary | ICD-10-CM

## 2022-06-21 DIAGNOSIS — J309 Allergic rhinitis, unspecified: Secondary | ICD-10-CM | POA: Diagnosis not present

## 2022-06-21 NOTE — Addendum Note (Signed)
Addended by: Valentina Shaggy on: 06/21/2022 05:19 PM   Modules accepted: Orders

## 2022-06-21 NOTE — Progress Notes (Signed)
I talked to [facility] and she confirmed that they would pay.   Salvatore Marvel, MD [facility] [place]

## 2022-06-23 DIAGNOSIS — J3081 Allergic rhinitis due to animal (cat) (dog) hair and dander: Secondary | ICD-10-CM

## 2022-06-23 NOTE — Progress Notes (Signed)
VIAL MADE 06-24-23

## 2022-06-23 NOTE — Progress Notes (Signed)
Aeroallergen Immunotherapy   Ordering Provider: Dr. Salvatore Marvel   Patient Details  Name: SHERREL PLOCH  MRN: [id number]  Date of Birth: 1976-01-12   Order 3 of 3   Vial Label: CAT   0.7 ml (Volume)  1:10 Concentration -- Cat Hair    0.7  ml Extract Subtotal  4.3  ml Diluent  5.0  ml Maintenance Total   Schedule:  B  Silver Vial (1:1,000,000):  Blue Vial (1:100,000): Schedule B (6 doses)  Yellow Vial (1:10,000): Schedule B (6 doses)  Green Vial (1:1,000): Schedule B (6 doses)  Red Vial (1:100): Schedule B (6 doses)   Special Instructions: Patient can come TWICE WEEKLY for cat only. Once she is at Red and needs a new vial, we will combine with another vial so she only needs two vials.

## 2022-06-30 ENCOUNTER — Ambulatory Visit (INDEPENDENT_AMBULATORY_CARE_PROVIDER_SITE_OTHER): Payer: Federal, State, Local not specified - PPO

## 2022-06-30 DIAGNOSIS — J309 Allergic rhinitis, unspecified: Secondary | ICD-10-CM

## 2022-07-06 ENCOUNTER — Ambulatory Visit (INDEPENDENT_AMBULATORY_CARE_PROVIDER_SITE_OTHER): Payer: Federal, State, Local not specified - PPO | Admitting: *Deleted

## 2022-07-06 DIAGNOSIS — J309 Allergic rhinitis, unspecified: Secondary | ICD-10-CM

## 2022-07-08 ENCOUNTER — Ambulatory Visit (INDEPENDENT_AMBULATORY_CARE_PROVIDER_SITE_OTHER): Payer: Federal, State, Local not specified - PPO | Admitting: *Deleted

## 2022-07-08 DIAGNOSIS — J309 Allergic rhinitis, unspecified: Secondary | ICD-10-CM

## 2022-07-12 ENCOUNTER — Ambulatory Visit (INDEPENDENT_AMBULATORY_CARE_PROVIDER_SITE_OTHER): Payer: Federal, State, Local not specified - PPO | Admitting: *Deleted

## 2022-07-12 DIAGNOSIS — J309 Allergic rhinitis, unspecified: Secondary | ICD-10-CM

## 2022-07-15 ENCOUNTER — Ambulatory Visit (INDEPENDENT_AMBULATORY_CARE_PROVIDER_SITE_OTHER): Payer: Federal, State, Local not specified - PPO | Admitting: *Deleted

## 2022-07-15 DIAGNOSIS — J309 Allergic rhinitis, unspecified: Secondary | ICD-10-CM | POA: Diagnosis not present

## 2022-07-18 ENCOUNTER — Ambulatory Visit (INDEPENDENT_AMBULATORY_CARE_PROVIDER_SITE_OTHER): Payer: Federal, State, Local not specified - PPO

## 2022-07-18 DIAGNOSIS — J309 Allergic rhinitis, unspecified: Secondary | ICD-10-CM | POA: Diagnosis not present

## 2022-07-20 ENCOUNTER — Ambulatory Visit (INDEPENDENT_AMBULATORY_CARE_PROVIDER_SITE_OTHER): Payer: Federal, State, Local not specified - PPO | Admitting: *Deleted

## 2022-07-20 DIAGNOSIS — J309 Allergic rhinitis, unspecified: Secondary | ICD-10-CM

## 2022-07-26 ENCOUNTER — Ambulatory Visit (INDEPENDENT_AMBULATORY_CARE_PROVIDER_SITE_OTHER): Payer: Federal, State, Local not specified - PPO

## 2022-07-26 DIAGNOSIS — J309 Allergic rhinitis, unspecified: Secondary | ICD-10-CM

## 2022-07-28 ENCOUNTER — Ambulatory Visit (INDEPENDENT_AMBULATORY_CARE_PROVIDER_SITE_OTHER): Payer: Federal, State, Local not specified - PPO

## 2022-07-28 DIAGNOSIS — J309 Allergic rhinitis, unspecified: Secondary | ICD-10-CM | POA: Diagnosis not present

## 2022-08-03 ENCOUNTER — Ambulatory Visit (INDEPENDENT_AMBULATORY_CARE_PROVIDER_SITE_OTHER): Payer: Federal, State, Local not specified - PPO | Admitting: *Deleted

## 2022-08-03 DIAGNOSIS — J309 Allergic rhinitis, unspecified: Secondary | ICD-10-CM

## 2022-08-08 ENCOUNTER — Ambulatory Visit (INDEPENDENT_AMBULATORY_CARE_PROVIDER_SITE_OTHER): Payer: Federal, State, Local not specified - PPO

## 2022-08-08 DIAGNOSIS — J309 Allergic rhinitis, unspecified: Secondary | ICD-10-CM | POA: Diagnosis not present

## 2022-08-11 ENCOUNTER — Ambulatory Visit (INDEPENDENT_AMBULATORY_CARE_PROVIDER_SITE_OTHER): Payer: Federal, State, Local not specified - PPO

## 2022-08-11 DIAGNOSIS — J309 Allergic rhinitis, unspecified: Secondary | ICD-10-CM

## 2022-08-17 ENCOUNTER — Ambulatory Visit (INDEPENDENT_AMBULATORY_CARE_PROVIDER_SITE_OTHER): Payer: Federal, State, Local not specified - PPO

## 2022-08-17 DIAGNOSIS — J309 Allergic rhinitis, unspecified: Secondary | ICD-10-CM

## 2022-08-26 ENCOUNTER — Ambulatory Visit (INDEPENDENT_AMBULATORY_CARE_PROVIDER_SITE_OTHER): Payer: Federal, State, Local not specified - PPO

## 2022-08-26 DIAGNOSIS — J309 Allergic rhinitis, unspecified: Secondary | ICD-10-CM

## 2022-08-31 ENCOUNTER — Ambulatory Visit (INDEPENDENT_AMBULATORY_CARE_PROVIDER_SITE_OTHER): Payer: Federal, State, Local not specified - PPO | Admitting: *Deleted

## 2022-08-31 DIAGNOSIS — J309 Allergic rhinitis, unspecified: Secondary | ICD-10-CM

## 2022-09-15 ENCOUNTER — Ambulatory Visit (INDEPENDENT_AMBULATORY_CARE_PROVIDER_SITE_OTHER): Payer: Federal, State, Local not specified - PPO

## 2022-09-15 DIAGNOSIS — J309 Allergic rhinitis, unspecified: Secondary | ICD-10-CM | POA: Diagnosis not present

## 2022-09-19 ENCOUNTER — Ambulatory Visit (INDEPENDENT_AMBULATORY_CARE_PROVIDER_SITE_OTHER): Payer: Federal, State, Local not specified - PPO | Admitting: *Deleted

## 2022-09-19 DIAGNOSIS — J309 Allergic rhinitis, unspecified: Secondary | ICD-10-CM

## 2022-09-22 ENCOUNTER — Ambulatory Visit (INDEPENDENT_AMBULATORY_CARE_PROVIDER_SITE_OTHER): Payer: Federal, State, Local not specified - PPO | Admitting: *Deleted

## 2022-09-22 DIAGNOSIS — J309 Allergic rhinitis, unspecified: Secondary | ICD-10-CM | POA: Diagnosis not present

## 2022-09-26 ENCOUNTER — Ambulatory Visit (INDEPENDENT_AMBULATORY_CARE_PROVIDER_SITE_OTHER): Payer: Federal, State, Local not specified - PPO | Admitting: *Deleted

## 2022-09-26 DIAGNOSIS — J309 Allergic rhinitis, unspecified: Secondary | ICD-10-CM

## 2022-09-27 DIAGNOSIS — L818 Other specified disorders of pigmentation: Secondary | ICD-10-CM | POA: Diagnosis not present

## 2022-09-28 ENCOUNTER — Ambulatory Visit (INDEPENDENT_AMBULATORY_CARE_PROVIDER_SITE_OTHER): Payer: Federal, State, Local not specified - PPO | Admitting: *Deleted

## 2022-09-28 DIAGNOSIS — J309 Allergic rhinitis, unspecified: Secondary | ICD-10-CM | POA: Diagnosis not present

## 2022-10-13 ENCOUNTER — Ambulatory Visit (INDEPENDENT_AMBULATORY_CARE_PROVIDER_SITE_OTHER): Payer: Federal, State, Local not specified - PPO

## 2022-10-13 DIAGNOSIS — J309 Allergic rhinitis, unspecified: Secondary | ICD-10-CM

## 2022-10-20 ENCOUNTER — Ambulatory Visit (INDEPENDENT_AMBULATORY_CARE_PROVIDER_SITE_OTHER): Payer: Federal, State, Local not specified - PPO

## 2022-10-20 DIAGNOSIS — J309 Allergic rhinitis, unspecified: Secondary | ICD-10-CM

## 2022-10-20 NOTE — Progress Notes (Signed)
VIALS EXP 10-21-23 

## 2022-10-21 ENCOUNTER — Other Ambulatory Visit: Payer: Self-pay | Admitting: Family Medicine

## 2022-10-21 DIAGNOSIS — J302 Other seasonal allergic rhinitis: Secondary | ICD-10-CM

## 2022-10-21 NOTE — Telephone Encounter (Signed)
Appointment scheduled for 11/04/22. Dm/cma

## 2022-10-21 NOTE — Telephone Encounter (Signed)
Refill request for  Levothyroxine 75 mcg  LR 04/22/22, #90, 1 rf LOV 01/31/22 FOV  none scheduled.     Please review and advise.  Thanks. Dm/cma

## 2022-10-28 ENCOUNTER — Ambulatory Visit (INDEPENDENT_AMBULATORY_CARE_PROVIDER_SITE_OTHER): Payer: Federal, State, Local not specified - PPO | Admitting: *Deleted

## 2022-10-28 DIAGNOSIS — J309 Allergic rhinitis, unspecified: Secondary | ICD-10-CM

## 2022-11-04 ENCOUNTER — Ambulatory Visit: Payer: Federal, State, Local not specified - PPO | Admitting: Family Medicine

## 2022-11-04 ENCOUNTER — Telehealth: Payer: Self-pay | Admitting: Family Medicine

## 2022-11-04 ENCOUNTER — Encounter: Payer: Self-pay | Admitting: Family Medicine

## 2022-11-04 ENCOUNTER — Other Ambulatory Visit: Payer: Self-pay | Admitting: Family Medicine

## 2022-11-04 VITALS — BP 118/70 | HR 63 | Temp 97.3°F | Ht 61.0 in | Wt 137.0 lb

## 2022-11-04 DIAGNOSIS — A6000 Herpesviral infection of urogenital system, unspecified: Secondary | ICD-10-CM | POA: Insufficient documentation

## 2022-11-04 DIAGNOSIS — G2581 Restless legs syndrome: Secondary | ICD-10-CM

## 2022-11-04 DIAGNOSIS — R5383 Other fatigue: Secondary | ICD-10-CM | POA: Diagnosis not present

## 2022-11-04 DIAGNOSIS — Z2989 Encounter for other specified prophylactic measures: Secondary | ICD-10-CM | POA: Insufficient documentation

## 2022-11-04 DIAGNOSIS — Z23 Encounter for immunization: Secondary | ICD-10-CM

## 2022-11-04 DIAGNOSIS — F5101 Primary insomnia: Secondary | ICD-10-CM | POA: Diagnosis not present

## 2022-11-04 DIAGNOSIS — E039 Hypothyroidism, unspecified: Secondary | ICD-10-CM

## 2022-11-04 LAB — TSH: TSH: 2.1 u[IU]/mL (ref 0.35–5.50)

## 2022-11-04 LAB — CBC
HCT: 40.4 % (ref 36.0–46.0)
Hemoglobin: 13.4 g/dL (ref 12.0–15.0)
MCHC: 33.3 g/dL (ref 30.0–36.0)
MCV: 95.3 fl (ref 78.0–100.0)
Platelets: 297 10*3/uL (ref 150.0–400.0)
RBC: 4.24 Mil/uL (ref 3.87–5.11)
RDW: 13.7 % (ref 11.5–15.5)
WBC: 5.5 10*3/uL (ref 4.0–10.5)

## 2022-11-04 MED ORDER — EPINEPHRINE 0.3 MG/0.3ML IJ SOAJ: 0.3000 mg | Freq: Once | INTRAMUSCULAR | 2 refills | Status: DC | PRN

## 2022-11-04 MED ORDER — DIAZEPAM 10 MG PO TABS: 5.0000 mg | ORAL_TABLET | Freq: Every evening | ORAL | 1 refills | Status: DC | PRN

## 2022-11-04 MED ORDER — ROPINIROLE HCL 0.25 MG PO TABS: 0.2500 mg | ORAL_TABLET | Freq: Every day | ORAL | 1 refills | Status: DC

## 2022-11-04 NOTE — Telephone Encounter (Signed)
Pt said call her about her medication you sent in

## 2022-11-04 NOTE — Progress Notes (Addendum)
[facility] [address] [place] [address] Dept: [phone] Dept Fax: [phone]  Chronic Care Office Visit  Subjective:    Patient ID: Lisa Townsend, female    DOB: December 23, 1975, 46 y.o..   MRN: [id number]  Chief Complaint  Patient presents with   Follow-up    F/u meds.  Feeling tired/fatigue.  Wants discuss Rx for Diazepam.    History of Present Illness:  Patient is in today for reassessment of chronic medical issues.  Lisa Townsend has a history of generalized anxiety. She is managed on sertraline 50 mg daily. She is not sure if this is very effective or not.   Lisa Townsend has a history of chronic insomnia. She feels like diazepam 10 mg at bedtime is the only thing that has really been effective for her. She feels like she needs this about twice a week. In addition, she does complain of periodic sensations of tension in her legs at night, esp. below the knees. The tension is relived with movement, but this also interferes with her sleep. All of this has contributed to a general feeling of low energy and fatigue. She has some mild snoring on occasion, but has never had pauses in her breathing. She does still need to get up at night periodically with her son.  Lisa Townsend has a history of hypothyroidism. She is manage don levothyroxine 75 mcg daily.  Lisa Townsend has a history of seasonal and perennial allergies. She is currently undergoing immunotherapy. She notes her Epi-Pen has expired.  Past Medical History: Patient Active Problem List   Diagnosis Date Noted   Genital herpes simplex 11/04/2022   Immunotherapy 11/04/2022   History of infertility- ovarian insufficiency 12/29/2021   Seasonal and perennial allergic rhinoconjunctivitis 07/14/2020   Primary insomnia 08/02/2017   Constipation 10/13/2016   Sciatica, right 08/02/2016   H/O vitamin D deficiency 06/08/2016   Generalized anxiety disorder 05/21/2015   Chronic migraine  without aura without status migrainosus, not intractable 05/05/2015   Hypothyroidism 01/06/2014   Past Surgical History:  Procedure Laterality Date   BREAST BIOPSY Left 02/03/2014   CESAREAN SECTION N/A 08/04/2021   Procedure: CESAREAN SECTION;  Surgeon: Deliah Boston, MD;  Location: [facility] LD ORS;  Service: Obstetrics;  Laterality: N/A;   NOSE SURGERY     Family History  Problem Relation Age of Onset   Cancer Mother    Hypertension Mother    Breast cancer Mother 51   Urolithiasis Father    Allergic rhinitis Brother    Alcohol abuse Maternal Uncle    Prostate cancer Paternal Uncle        mid 33s   Breast cancer Maternal Grandmother        dx under 35   Stomach cancer Paternal Grandmother        dx in her 26s   Asthma Neg Hx    Eczema Neg Hx    Urticaria Neg Hx    Colon cancer Neg Hx    Colon polyps Neg Hx    Esophageal cancer Neg Hx    Rectal cancer Neg Hx    Outpatient Medications Prior to Visit  Medication Sig Dispense Refill   albuterol (VENTOLIN HFA) 108 (90 Base) MCG/ACT inhaler Inhale 1-2 puffs into the lungs every 6 (six) hours as needed for wheezing or shortness of breath. 90 g 0   Ascorbic Acid (VITAMIN C) 1000 MG tablet Take 1,000 mg by mouth daily.     cetirizine (  ZYRTEC) 10 MG tablet Take 10 mg by mouth daily.     cholecalciferol (VITAMIN D3) 25 MCG (1000 UNIT) tablet Take 1,000 Units by mouth daily.     levothyroxine (SYNTHROID) 75 MCG tablet TAKE 1 TABLET BY MOUTH EVERY DAY 90 tablet 0   Multiple Vitamin (MULTIVITAMIN) tablet Take 1 tablet by mouth daily.     Olopatadine HCl 0.2 % SOLN Apply 1 drop to eye daily as needed (itchy/watery eyes). 2.5 mL 5   rizatriptan (MAXALT) 10 MG tablet TAKE 1 TABLET BY MOUTH AS NEEDED FOR MIGRAINE. MAY REPEAT IN 2 HOURS IF NEEDED 10 tablet 3   sertraline (ZOLOFT) 100 MG tablet TAKE 1 TABLET(100 MG) BY MOUTH DAILY 90 tablet 3   vitamin E 1000 UNIT capsule Take 1,000 Units by mouth daily.     vitamin k 100 MCG tablet Take 100  mcg by mouth daily.     diazepam (VALIUM) 5 MG tablet Take 0.5 tablets (2.5 mg total) by mouth at bedtime as needed (sleep). 15 tablet 1   EPINEPHrine 0.3 mg/0.3 mL IJ SOAJ injection Inject 0.3 mLs (0.3 mg total) into the muscle once as needed for up to 1 dose for anaphylaxis. 0.3 mL 2   Ferrous Sulfate (IRON) 325 (65 Fe) MG TABS Take by mouth. (Patient not taking: Reported on 11/04/2022)     potassium chloride (KLOR-CON) 20 MEQ packet Take by mouth 2 (two) times daily.     No facility-administered medications prior to visit.   Allergies  Allergen Reactions   Claritin [Loratadine]     shakiness   Latex Itching   Lexapro [Escitalopram] Nausea Only    Objective:   Today's Vitals   11/04/22 0922  BP: 118/70  Pulse: 63  Temp: (!) 97.3 F (36.3 C)  TempSrc: Temporal  SpO2: 97%  Weight: 137 lb (62.1 kg)  Height: '5\' 1"'$  (1.549 m)   Body mass index is 25.89 kg/m.   General: Well developed, well nourished. No acute distress. Psych: Alert and oriented. Normal mood and affect.  Health Maintenance Due  Topic Date Due   INFLUENZA VACCINE  06/07/2022        11/04/2022   10:51 AM 12/29/2021    9:40 AM 11/30/2020    8:43 AM  Depression screen PHQ 2/9  Decreased Interest 1 0 0  Down, Depressed, Hopeless 1 1 0  PHQ - 2 Score 2 1 0  Altered sleeping 2  0  Tired, decreased energy 3  0  Change in appetite 1  0  Feeling bad or failure about yourself  0  0  Trouble concentrating 0  0  Moving slowly or fidgety/restless 0  0  Suicidal thoughts 0  0  PHQ-9 Score 8  0  Difficult doing work/chores Somewhat difficult  Not difficult at all      11/04/2022   10:52 AM  GAD 7 : Generalized Anxiety Score  Nervous, Anxious, on Edge 1  Control/stop worrying 1  Worry too much - different things 1  Trouble relaxing 1  Restless 1  Easily annoyed or irritable 0  Afraid - awful might happen 0  Total GAD 7 Score 5  Anxiety Difficulty Not difficult at all   Assessment & Plan:   1. Primary  insomnia I will continue diazepam for her insomnia. She should try and limit this to 1-2 times a week. I advised her against increasing the dosage on her own.  - diazepam (VALIUM) 10 MG tablet; Take 0.5-1 tablets (5-10 mg  total) by mouth at bedtime as needed (sleep).  Dispense: 12 tablet; Refill: 1  2. Restless legs As there appears to be a component of restless legs with her sleep issues, I will screen for iron deficiency. We will give a trial of ropinorole to see if this helps.  - rOPINIRole (REQUIP) 0.25 MG tablet; Take 1 tablet (0.25 mg total) by mouth at bedtime. May increase to two tablets at bedtime after 7 days if symptoms are not improved.  Dispense: 60 tablet; Refill: 1 - Iron, TIBC and Ferritin Panel  3. Acquired hypothyroidism I will reassess her TSH today. Plan to continue levothyroxine 75 mcg daily.  - TSH  4. Other fatigue We discussed potential causes of her fatigue. This is likely due to inadequate sleep. Hopefully, our efforts at improving this will help. I will reassess her TSH and blood counts to assure she is not anemic.  - CBC - Iron, TIBC and Ferritin Panel  5. Immunotherapy I will renew her Epi-Pen.  - EPINEPHrine 0.3 mg/0.3 mL IJ SOAJ injection; Inject 0.3 mg into the muscle once as needed for up to 1 dose for anaphylaxis.  Dispense: 0.3 mL; Refill: 2  6. Need for influenza vaccination  - Flu Vaccine QUAD 6+ mos PF IM (Fluarix Quad PF)  Return in about 6 months (around 05/06/2023) for Reassessment.   Haydee Salter, MD

## 2022-11-04 NOTE — Telephone Encounter (Signed)
Lft VM to rtn call. Dm/cma  

## 2022-11-04 NOTE — Patient Instructions (Signed)
Restless Legs Syndrome Restless legs syndrome is a condition that causes uncomfortable feelings or sensations in the legs, especially while sitting or lying down. The sensations usually cause an overwhelming urge to move the legs. The arms can also sometimes be affected. The condition can range from mild to severe. The symptoms often interfere with a person's ability to sleep. What are the causes? The cause of this condition is not known. What increases the risk? The following factors may make you more likely to develop this condition: Being older than 50. Pregnancy. Being a woman. In general, the condition is more common in women than in men. A family history of the condition. Having iron deficiency. Overuse of caffeine, nicotine, or alcohol. Certain medical conditions, such as kidney disease, Parkinson's disease, or nerve damage. Certain medicines, such as those for high blood pressure, nausea, colds, allergies, depression, and some heart conditions. What are the signs or symptoms? The main symptom of this condition is uncomfortable sensations in the legs, such as: Pulling. Tingling. Prickling. Throbbing. Crawling. Burning. Usually, the sensations: Affect both sides of the body. Are worse when you sit or lie down. Are worse at night. These may make it difficult to fall asleep. Make you have a strong urge to move your legs. Are temporarily relieved by moving your legs or standing. The arms can also be affected, but this is rare. People who have this condition often have tiredness during the day because of their lack of sleep at night. How is this diagnosed? This condition may be diagnosed based on: Your symptoms. Blood tests. In some cases, you may be monitored in a sleep lab by a specialist (a sleep study). This can detect any disruptions in your sleep. How is this treated? This condition is treated by managing the symptoms. This may include: Lifestyle changes, such as  exercising, using relaxation techniques, and avoiding caffeine, alcohol, or tobacco. Iron supplements. Medicines. Parkinson's medications may be tried first. Anti-seizure medications can also be helpful. Follow these instructions at home: General instructions Take over-the-counter and prescription medicines only as told by your health care provider. Use methods to help relieve the uncomfortable sensations, such as: Massaging your legs. Walking or stretching. Taking a cold or hot bath. Keep all follow-up visits. This is important. Lifestyle     Practice good sleep habits. For example, go to bed and get up at the same time every day. Most adults should get 7-9 hours of sleep each night. Exercise regularly. Try to get at least 30 minutes of exercise most days of the week. Practice ways of relaxing, such as yoga or meditation. Avoid caffeine and alcohol. Do not use any products that contain nicotine or tobacco. These products include cigarettes, chewing tobacco, and vaping devices, such as e-cigarettes. If you need help quitting, ask your health care provider. Where to find more information National Institute of Neurological Disorders and Stroke: www.ninds.nih.gov Contact a health care provider if: Your symptoms get worse or they do not improve with treatment. Summary Restless legs syndrome is a condition that causes uncomfortable feelings or sensations in the legs, especially while sitting or lying down. The symptoms often interfere with your ability to sleep. This condition is treated by managing the symptoms. You may need to make lifestyle changes or take medicines. This information is not intended to replace advice given to you by your health care provider. Make sure you discuss any questions you have with your health care provider. Document Revised: 06/06/2021 Document Reviewed: 06/06/2021 Elsevier Patient Education    2023 Elsevier Inc.  

## 2022-11-05 LAB — IRON,TIBC AND FERRITIN PANEL
%SAT: 21 % (calc) (ref 16–45)
Ferritin: 31 ng/mL (ref 16–232)
Iron: 75 ug/dL (ref 40–190)
TIBC: 349 mcg/dL (calc) (ref 250–450)

## 2022-11-09 ENCOUNTER — Ambulatory Visit (INDEPENDENT_AMBULATORY_CARE_PROVIDER_SITE_OTHER): Payer: Federal, State, Local not specified - PPO

## 2022-11-09 DIAGNOSIS — J309 Allergic rhinitis, unspecified: Secondary | ICD-10-CM

## 2022-11-09 NOTE — Telephone Encounter (Signed)
Lft VM to rtn call. Dm/cma  

## 2022-11-16 ENCOUNTER — Other Ambulatory Visit: Payer: Self-pay | Admitting: Family Medicine

## 2022-11-17 ENCOUNTER — Ambulatory Visit (INDEPENDENT_AMBULATORY_CARE_PROVIDER_SITE_OTHER): Payer: Federal, State, Local not specified - PPO

## 2022-11-17 DIAGNOSIS — J309 Allergic rhinitis, unspecified: Secondary | ICD-10-CM | POA: Diagnosis not present

## 2022-11-30 ENCOUNTER — Ambulatory Visit (INDEPENDENT_AMBULATORY_CARE_PROVIDER_SITE_OTHER): Payer: Federal, State, Local not specified - PPO | Admitting: *Deleted

## 2022-11-30 DIAGNOSIS — J324 Chronic pansinusitis: Secondary | ICD-10-CM | POA: Diagnosis not present

## 2022-11-30 DIAGNOSIS — J309 Allergic rhinitis, unspecified: Secondary | ICD-10-CM

## 2022-11-30 DIAGNOSIS — J343 Hypertrophy of nasal turbinates: Secondary | ICD-10-CM | POA: Diagnosis not present

## 2022-12-06 ENCOUNTER — Other Ambulatory Visit: Payer: Self-pay | Admitting: Otolaryngology

## 2022-12-06 DIAGNOSIS — J329 Chronic sinusitis, unspecified: Secondary | ICD-10-CM

## 2022-12-08 ENCOUNTER — Ambulatory Visit
Admission: RE | Admit: 2022-12-08 | Discharge: 2022-12-08 | Disposition: A | Discharge status: Home / Self Care | Payer: Federal, State, Local not specified - PPO | Source: Ambulatory Visit | Attending: Otolaryngology | Admitting: Otolaryngology

## 2022-12-08 ENCOUNTER — Ambulatory Visit (INDEPENDENT_AMBULATORY_CARE_PROVIDER_SITE_OTHER): Payer: Federal, State, Local not specified - PPO

## 2022-12-08 DIAGNOSIS — J3489 Other specified disorders of nose and nasal sinuses: Secondary | ICD-10-CM | POA: Diagnosis not present

## 2022-12-08 DIAGNOSIS — J309 Allergic rhinitis, unspecified: Secondary | ICD-10-CM | POA: Diagnosis not present

## 2022-12-08 DIAGNOSIS — J329 Chronic sinusitis, unspecified: Secondary | ICD-10-CM

## 2022-12-12 ENCOUNTER — Other Ambulatory Visit: Payer: Self-pay | Admitting: Family Medicine

## 2022-12-16 ENCOUNTER — Ambulatory Visit (INDEPENDENT_AMBULATORY_CARE_PROVIDER_SITE_OTHER): Payer: Federal, State, Local not specified - PPO | Admitting: *Deleted

## 2022-12-16 DIAGNOSIS — J309 Allergic rhinitis, unspecified: Secondary | ICD-10-CM | POA: Diagnosis not present

## 2022-12-20 ENCOUNTER — Ambulatory Visit (INDEPENDENT_AMBULATORY_CARE_PROVIDER_SITE_OTHER): Payer: Federal, State, Local not specified - PPO

## 2022-12-20 DIAGNOSIS — J309 Allergic rhinitis, unspecified: Secondary | ICD-10-CM | POA: Diagnosis not present

## 2022-12-21 DIAGNOSIS — L811 Chloasma: Secondary | ICD-10-CM | POA: Diagnosis not present

## 2022-12-22 DIAGNOSIS — J324 Chronic pansinusitis: Secondary | ICD-10-CM | POA: Diagnosis not present

## 2022-12-22 DIAGNOSIS — J343 Hypertrophy of nasal turbinates: Secondary | ICD-10-CM | POA: Diagnosis not present

## 2023-01-03 ENCOUNTER — Ambulatory Visit: Payer: Self-pay

## 2023-01-04 ENCOUNTER — Ambulatory Visit (INDEPENDENT_AMBULATORY_CARE_PROVIDER_SITE_OTHER): Payer: Federal, State, Local not specified - PPO

## 2023-01-04 DIAGNOSIS — J309 Allergic rhinitis, unspecified: Secondary | ICD-10-CM

## 2023-01-11 ENCOUNTER — Ambulatory Visit (INDEPENDENT_AMBULATORY_CARE_PROVIDER_SITE_OTHER): Payer: Federal, State, Local not specified - PPO

## 2023-01-11 DIAGNOSIS — J309 Allergic rhinitis, unspecified: Secondary | ICD-10-CM | POA: Diagnosis not present

## 2023-01-17 ENCOUNTER — Ambulatory Visit (INDEPENDENT_AMBULATORY_CARE_PROVIDER_SITE_OTHER): Payer: Federal, State, Local not specified - PPO

## 2023-01-17 DIAGNOSIS — J309 Allergic rhinitis, unspecified: Secondary | ICD-10-CM

## 2023-01-24 ENCOUNTER — Telehealth: Payer: Self-pay | Admitting: Allergy & Immunology

## 2023-01-24 DIAGNOSIS — J302 Other seasonal allergic rhinitis: Secondary | ICD-10-CM | POA: Diagnosis not present

## 2023-01-24 DIAGNOSIS — H1010 Acute atopic conjunctivitis, unspecified eye: Secondary | ICD-10-CM

## 2023-01-24 NOTE — Progress Notes (Signed)
VIALS EXP 01-24-24

## 2023-01-24 NOTE — Progress Notes (Signed)
Aeroallergen Immunotherapy (** NOTE NEW SCRIPT **)    Patient Details  Name: Lisa Townsend  MRN: [id number]  Date of Birth: 01-29-1976   Order 1 of 2   Vial Label: G-[id number]   0.3 ml (Volume)  BAU Concentration -- 7 Grass Mix* 100,000 ([address] [place], [place], [place], Perennial [place], [place], Sweet [place], [place])  0.2 ml (Volume)  1:20 Concentration -- [place]  0.3 ml (Volume)  BAU Concentration -- [place] 10,000  0.2 ml (Volume)  1:20 Concentration -- [place]  0.3 ml (Volume)  1:20 Concentration -- Ragweed Mix  0.5 ml (Volume)  1:20 Concentration -- Eastern 10 Tree Mix (also Sweet Gum)  0.2 ml (Volume)  1:10 Concentration -- Pine Mix  0.7 ml (Volume)  1:10 Concentration -- Cat Hair    2.7  ml Extract Subtotal  2.3  ml Diluent  5.0  ml Maintenance Total    Final Concentration above is stated in weight/volume (wt/vol).  Allergen units (AU/ml) biological units (BAU/ml).  The total volume is 5 ml.    Schedule:  B

## 2023-01-24 NOTE — Telephone Encounter (Signed)
Wrote new shot scrip incorporating the cat into her pollen vial.   Salvatore Marvel, MD [facility]

## 2023-01-25 ENCOUNTER — Ambulatory Visit (INDEPENDENT_AMBULATORY_CARE_PROVIDER_SITE_OTHER): Payer: Federal, State, Local not specified - PPO

## 2023-01-25 DIAGNOSIS — J309 Allergic rhinitis, unspecified: Secondary | ICD-10-CM | POA: Diagnosis not present

## 2023-02-06 ENCOUNTER — Other Ambulatory Visit: Payer: Self-pay | Admitting: Family Medicine

## 2023-02-06 DIAGNOSIS — F5101 Primary insomnia: Secondary | ICD-10-CM

## 2023-02-07 ENCOUNTER — Ambulatory Visit (INDEPENDENT_AMBULATORY_CARE_PROVIDER_SITE_OTHER): Payer: Federal, State, Local not specified - PPO | Admitting: *Deleted

## 2023-02-07 DIAGNOSIS — J309 Allergic rhinitis, unspecified: Secondary | ICD-10-CM | POA: Diagnosis not present

## 2023-02-10 DIAGNOSIS — M546 Pain in thoracic spine: Secondary | ICD-10-CM | POA: Diagnosis not present

## 2023-02-10 DIAGNOSIS — M542 Cervicalgia: Secondary | ICD-10-CM | POA: Diagnosis not present

## 2023-02-14 ENCOUNTER — Ambulatory Visit (INDEPENDENT_AMBULATORY_CARE_PROVIDER_SITE_OTHER): Payer: Federal, State, Local not specified - PPO

## 2023-02-14 DIAGNOSIS — J309 Allergic rhinitis, unspecified: Secondary | ICD-10-CM

## 2023-02-24 ENCOUNTER — Ambulatory Visit (INDEPENDENT_AMBULATORY_CARE_PROVIDER_SITE_OTHER): Payer: Federal, State, Local not specified - PPO

## 2023-02-24 DIAGNOSIS — J309 Allergic rhinitis, unspecified: Secondary | ICD-10-CM | POA: Diagnosis not present

## 2023-03-03 ENCOUNTER — Ambulatory Visit (INDEPENDENT_AMBULATORY_CARE_PROVIDER_SITE_OTHER): Payer: Federal, State, Local not specified - PPO

## 2023-03-03 DIAGNOSIS — J309 Allergic rhinitis, unspecified: Secondary | ICD-10-CM | POA: Diagnosis not present

## 2023-03-07 ENCOUNTER — Ambulatory Visit (INDEPENDENT_AMBULATORY_CARE_PROVIDER_SITE_OTHER): Payer: Federal, State, Local not specified - PPO

## 2023-03-07 DIAGNOSIS — J309 Allergic rhinitis, unspecified: Secondary | ICD-10-CM | POA: Diagnosis not present

## 2023-03-15 ENCOUNTER — Ambulatory Visit (INDEPENDENT_AMBULATORY_CARE_PROVIDER_SITE_OTHER): Payer: Federal, State, Local not specified - PPO

## 2023-03-15 DIAGNOSIS — L811 Chloasma: Secondary | ICD-10-CM | POA: Diagnosis not present

## 2023-03-15 DIAGNOSIS — J309 Allergic rhinitis, unspecified: Secondary | ICD-10-CM

## 2023-03-28 ENCOUNTER — Ambulatory Visit (INDEPENDENT_AMBULATORY_CARE_PROVIDER_SITE_OTHER): Payer: Federal, State, Local not specified - PPO

## 2023-03-28 DIAGNOSIS — J309 Allergic rhinitis, unspecified: Secondary | ICD-10-CM

## 2023-04-13 ENCOUNTER — Ambulatory Visit (INDEPENDENT_AMBULATORY_CARE_PROVIDER_SITE_OTHER): Payer: Federal, State, Local not specified - PPO

## 2023-04-13 DIAGNOSIS — J309 Allergic rhinitis, unspecified: Secondary | ICD-10-CM | POA: Diagnosis not present

## 2023-04-20 DIAGNOSIS — B2790 Infectious mononucleosis, unspecified without complication: Secondary | ICD-10-CM | POA: Diagnosis not present

## 2023-04-20 DIAGNOSIS — D519 Vitamin B12 deficiency anemia, unspecified: Secondary | ICD-10-CM | POA: Diagnosis not present

## 2023-04-20 DIAGNOSIS — D509 Iron deficiency anemia, unspecified: Secondary | ICD-10-CM | POA: Diagnosis not present

## 2023-04-20 DIAGNOSIS — D649 Anemia, unspecified: Secondary | ICD-10-CM | POA: Diagnosis not present

## 2023-04-20 DIAGNOSIS — E531 Pyridoxine deficiency: Secondary | ICD-10-CM | POA: Diagnosis not present

## 2023-04-20 DIAGNOSIS — E282 Polycystic ovarian syndrome: Secondary | ICD-10-CM | POA: Diagnosis not present

## 2023-04-20 DIAGNOSIS — D899 Disorder involving the immune mechanism, unspecified: Secondary | ICD-10-CM | POA: Diagnosis not present

## 2023-04-20 DIAGNOSIS — E2839 Other primary ovarian failure: Secondary | ICD-10-CM | POA: Diagnosis not present

## 2023-04-20 DIAGNOSIS — E559 Vitamin D deficiency, unspecified: Secondary | ICD-10-CM | POA: Diagnosis not present

## 2023-04-20 DIAGNOSIS — A493 Mycoplasma infection, unspecified site: Secondary | ICD-10-CM | POA: Diagnosis not present

## 2023-04-20 DIAGNOSIS — E279 Disorder of adrenal gland, unspecified: Secondary | ICD-10-CM | POA: Diagnosis not present

## 2023-04-24 ENCOUNTER — Ambulatory Visit (HOSPITAL_BASED_OUTPATIENT_CLINIC_OR_DEPARTMENT_OTHER)
Admission: RE | Admit: 2023-04-24 | Discharge: 2023-04-24 | Disposition: A | Discharge status: Home / Self Care | Payer: Federal, State, Local not specified - PPO | Source: Ambulatory Visit | Attending: Physical Medicine and Rehabilitation | Admitting: Physical Medicine and Rehabilitation

## 2023-04-24 ENCOUNTER — Other Ambulatory Visit (HOSPITAL_BASED_OUTPATIENT_CLINIC_OR_DEPARTMENT_OTHER): Payer: Self-pay | Admitting: Physical Medicine and Rehabilitation

## 2023-04-24 DIAGNOSIS — M25562 Pain in left knee: Secondary | ICD-10-CM | POA: Diagnosis not present

## 2023-04-24 DIAGNOSIS — M238X1 Other internal derangements of right knee: Secondary | ICD-10-CM | POA: Insufficient documentation

## 2023-04-24 DIAGNOSIS — M25561 Pain in right knee: Secondary | ICD-10-CM | POA: Insufficient documentation

## 2023-04-24 DIAGNOSIS — M238X2 Other internal derangements of left knee: Secondary | ICD-10-CM

## 2023-05-01 ENCOUNTER — Ambulatory Visit (INDEPENDENT_AMBULATORY_CARE_PROVIDER_SITE_OTHER): Payer: Federal, State, Local not specified - PPO | Admitting: *Deleted

## 2023-05-01 DIAGNOSIS — J309 Allergic rhinitis, unspecified: Secondary | ICD-10-CM | POA: Diagnosis not present

## 2023-05-10 ENCOUNTER — Ambulatory Visit (INDEPENDENT_AMBULATORY_CARE_PROVIDER_SITE_OTHER): Payer: Federal, State, Local not specified - PPO | Admitting: *Deleted

## 2023-05-10 DIAGNOSIS — J309 Allergic rhinitis, unspecified: Secondary | ICD-10-CM

## 2023-05-16 DIAGNOSIS — E782 Mixed hyperlipidemia: Secondary | ICD-10-CM | POA: Diagnosis not present

## 2023-05-16 DIAGNOSIS — R5383 Other fatigue: Secondary | ICD-10-CM | POA: Diagnosis not present

## 2023-05-16 DIAGNOSIS — Z1331 Encounter for screening for depression: Secondary | ICD-10-CM | POA: Diagnosis not present

## 2023-05-16 DIAGNOSIS — N898 Other specified noninflammatory disorders of vagina: Secondary | ICD-10-CM | POA: Diagnosis not present

## 2023-05-16 DIAGNOSIS — G43709 Chronic migraine without aura, not intractable, without status migrainosus: Secondary | ICD-10-CM | POA: Diagnosis not present

## 2023-05-16 DIAGNOSIS — E663 Overweight: Secondary | ICD-10-CM | POA: Diagnosis not present

## 2023-05-16 DIAGNOSIS — F411 Generalized anxiety disorder: Secondary | ICD-10-CM | POA: Diagnosis not present

## 2023-05-16 DIAGNOSIS — Z79899 Other long term (current) drug therapy: Secondary | ICD-10-CM | POA: Diagnosis not present

## 2023-05-16 DIAGNOSIS — E039 Hypothyroidism, unspecified: Secondary | ICD-10-CM | POA: Diagnosis not present

## 2023-05-16 DIAGNOSIS — N951 Menopausal and female climacteric states: Secondary | ICD-10-CM | POA: Diagnosis not present

## 2023-05-24 DIAGNOSIS — J3081 Allergic rhinitis due to animal (cat) (dog) hair and dander: Secondary | ICD-10-CM | POA: Diagnosis not present

## 2023-05-24 NOTE — Progress Notes (Signed)
VIALS EXP 05-23-24

## 2023-05-25 ENCOUNTER — Ambulatory Visit (INDEPENDENT_AMBULATORY_CARE_PROVIDER_SITE_OTHER): Payer: Federal, State, Local not specified - PPO

## 2023-05-25 DIAGNOSIS — J309 Allergic rhinitis, unspecified: Secondary | ICD-10-CM

## 2023-06-01 DIAGNOSIS — E039 Hypothyroidism, unspecified: Secondary | ICD-10-CM | POA: Diagnosis not present

## 2023-06-01 DIAGNOSIS — Z6828 Body mass index (BMI) 28.0-28.9, adult: Secondary | ICD-10-CM | POA: Diagnosis not present

## 2023-06-06 ENCOUNTER — Other Ambulatory Visit: Payer: Self-pay | Admitting: Family Medicine

## 2023-06-09 ENCOUNTER — Ambulatory Visit (INDEPENDENT_AMBULATORY_CARE_PROVIDER_SITE_OTHER): Payer: Federal, State, Local not specified - PPO

## 2023-06-09 DIAGNOSIS — J309 Allergic rhinitis, unspecified: Secondary | ICD-10-CM | POA: Diagnosis not present

## 2023-06-11 DIAGNOSIS — G43709 Chronic migraine without aura, not intractable, without status migrainosus: Secondary | ICD-10-CM | POA: Diagnosis not present

## 2023-06-12 ENCOUNTER — Encounter: Payer: Self-pay | Admitting: Family Medicine

## 2023-06-14 DIAGNOSIS — Z1330 Encounter for screening examination for mental health and behavioral disorders, unspecified: Secondary | ICD-10-CM | POA: Diagnosis not present

## 2023-06-14 DIAGNOSIS — G43809 Other migraine, not intractable, without status migrainosus: Secondary | ICD-10-CM | POA: Diagnosis not present

## 2023-06-22 ENCOUNTER — Other Ambulatory Visit: Payer: Self-pay

## 2023-06-22 ENCOUNTER — Ambulatory Visit (INDEPENDENT_AMBULATORY_CARE_PROVIDER_SITE_OTHER): Payer: Federal, State, Local not specified - PPO | Admitting: Allergy & Immunology

## 2023-06-22 ENCOUNTER — Encounter: Payer: Self-pay | Admitting: Allergy & Immunology

## 2023-06-22 VITALS — BP 102/68 | HR 80 | Temp 98.6°F | Ht 60.5 in | Wt 144.4 lb

## 2023-06-22 DIAGNOSIS — R0602 Shortness of breath: Secondary | ICD-10-CM

## 2023-06-22 DIAGNOSIS — J4520 Mild intermittent asthma, uncomplicated: Secondary | ICD-10-CM | POA: Diagnosis not present

## 2023-06-22 DIAGNOSIS — J302 Other seasonal allergic rhinitis: Secondary | ICD-10-CM

## 2023-06-22 DIAGNOSIS — J309 Allergic rhinitis, unspecified: Secondary | ICD-10-CM

## 2023-06-22 DIAGNOSIS — H1013 Acute atopic conjunctivitis, bilateral: Secondary | ICD-10-CM

## 2023-06-22 DIAGNOSIS — J3089 Other allergic rhinitis: Secondary | ICD-10-CM | POA: Diagnosis not present

## 2023-06-22 DIAGNOSIS — H1010 Acute atopic conjunctivitis, unspecified eye: Secondary | ICD-10-CM

## 2023-06-22 MED ORDER — MONTELUKAST SODIUM 10 MG PO TABS: 10.0000 mg | ORAL_TABLET | Freq: Every day | ORAL | 1 refills | Status: DC

## 2023-06-22 MED ORDER — EPINEPHRINE 0.3 MG/0.3ML IJ SOAJ: 0.3000 mg | Freq: Once | INTRAMUSCULAR | 2 refills | Status: AC | PRN

## 2023-06-22 NOTE — Progress Notes (Signed)
FOLLOW UP  Date of Service/Encounter:  06/22/23   Assessment:   Seasonal and perennial allergic rhinoconjunctivitis (grass, weed, ragweed, trees, mold) - with maintenance reached November 2021    Shortness of breathing following COVID19 infection - resolved (previously on Symbicort BID), NO LONGER NEEDS SPIROS IN OUR OFFICE  Plan/Recommendations:   Environmental allergies - Continue with shots at the same schedule. - We are increasing the cat dander in the next set of vials.  - Hopefully this will help with your itching from your cats.  - Start Singulair (montelukast) 10mg  daily.  - You should know in one week or so if it is working.  - Lung testing looks GREAT today!  2.  Previous history of asthma  - Lung testing looks great today.   - We are not going to remove this from her problem list.   - Symptoms might evolve and related to COVID-19.    3. Return in about 1 year (around 06/21/2024).    Subjective:   Lisa Townsend is a 47 y.o. female presenting today for follow up of  Chief Complaint  Patient presents with   Allergic Rhinitis    Asthma   Follow-up    Lisa Townsend has a history of the following: Patient Active Problem List   Diagnosis Date Noted   Genital herpes simplex 11/04/2022   Immunotherapy 11/04/2022   History of infertility- ovarian insufficiency 12/29/2021   Seasonal and perennial allergic rhinoconjunctivitis 07/14/2020   Primary insomnia 08/02/2017   Constipation 10/13/2016   Sciatica, right 08/02/2016   H/O vitamin D deficiency 06/08/2016   Generalized anxiety disorder 05/21/2015   Chronic migraine without aura without status migrainosus, not intractable 05/05/2015   Hypothyroidism 01/06/2014    History obtained from: chart review and patient.  Lisa Townsend is a 47 y.o. female presenting for a follow up visit.  She was last seen in June 2023.  At that time, we obtained some blood work to complete her allergy workup.  We had previously  started her on cat immunotherapy in addition to her previous allergy prescription.  In March 2024, we combine this with her pollens.  Asthma/Respiratory Symptom History: Dr. Selena Batten had given her albuterol for the first time. She was told that she had problems breathing, but Lisa Townsend thinks that this is more anxiety rather than anything else.  This is all started when she first saw Dr. Selena Batten when she reported shortness of breath with COVID-19.  Allergic Rhinitis Symptom History: She has two cats at home. She thinks that it is easier to deal with them. She has one cat that likes to wrap around her and this causes her arm to itch.  She does report that she has a lot of congestion. She has tried multiple nose sprays without much improvement. She did see ENT and she was told that there was "nothing to worry about". She has been on a number of nasal sprays.   Lisa Townsend is on allergen immunotherapy. She receives two injections. Immunotherapy script #1 contains  ragweed, trees, grasses, and cat. She currently receives 0.69mL of the RED vial (1/100). Immunotherapy script #2 contains molds. She currently receives 0.83mL of the RED vial (1/100). She started shots June of 2021 and reached maintenance in November of 2021. We added cat in August 2023 and she reached maintenance of that vial in December 2023.    She is working as a Customer service manager.  Otherwise, there have been no changes to her past medical history, surgical  history, family history, or social history.    Review of systems otherwise negative other than that mentioned in the HPI.    Objective:   Blood pressure 102/68, pulse 80, temperature 98.6 F (37 C), temperature source Temporal, height 5' 0.5" (1.537 m), weight 144 lb 6.4 oz (65.5 kg), SpO2 100%, not currently breastfeeding. Body mass index is 27.74 kg/m.    Physical Exam Vitals reviewed.  Constitutional:      Appearance: She is well-developed.     Comments: Seems slightly down.  She  is smiling by the end of the visit.  HENT:     Head: Normocephalic and atraumatic.     Right Ear: Tympanic membrane, ear canal and external ear normal.     Left Ear: Tympanic membrane, ear canal and external ear normal.     Nose: No nasal deformity, septal deviation, mucosal edema or rhinorrhea.     Right Turbinates: Enlarged, swollen and pale.     Left Turbinates: Enlarged, swollen and pale.     Right Sinus: No maxillary sinus tenderness or frontal sinus tenderness.     Left Sinus: No maxillary sinus tenderness or frontal sinus tenderness.     Comments: No nasal polyps noted.     Mouth/Throat:     Lips: Pink.     Mouth: Mucous membranes are moist. Mucous membranes are not pale and not dry.     Pharynx: Uvula midline.  Eyes:     General: Lids are normal. Allergic shiner present.        Right eye: No discharge.        Left eye: No discharge.     Conjunctiva/sclera: Conjunctivae normal.     Right eye: Right conjunctiva is not injected. No chemosis.    Left eye: Left conjunctiva is not injected. No chemosis.    Pupils: Pupils are equal, round, and reactive to light.  Cardiovascular:     Rate and Rhythm: Normal rate and regular rhythm.     Heart sounds: Normal heart sounds.  Pulmonary:     Effort: Pulmonary effort is normal. No tachypnea, accessory muscle usage or respiratory distress.     Breath sounds: Normal breath sounds. No wheezing, rhonchi or rales.     Comments: Moving air well in all lung fields. No increased work of breathing noted.  Chest:     Chest wall: No tenderness.  Lymphadenopathy:     Cervical: No cervical adenopathy.  Skin:    Coloration: Skin is not pale.     Findings: No abrasion, erythema, petechiae or rash. Rash is not papular, urticarial or vesicular.  Neurological:     Mental Status: She is alert.  Psychiatric:        Behavior: Behavior is cooperative.      Diagnostic studies:    Spirometry: results normal (FEV1: 2.29/98%, FVC: 2.92/102%, FEV1/FVC:  78%).    Spirometry consistent with normal pattern.   Allergy Studies: none        Malachi Bonds, MD  [facility]

## 2023-06-22 NOTE — Patient Instructions (Addendum)
Environmental allergies Continue with shots at the same schedule. We are increasing the cat dander in the next set of vials.  Hopefully this will help with your itching from your cats.  Start Singulair (montelukast) 10mg  daily.  You should know in one week or so if it is working.  Lung testing looks GREAT today!  2. Return in about 1 year (around 06/21/2024).    Please inform us of any Emergency Department visits, hospitalizations, or changes in symptoms. Call us before going to the ED for breathing or allergy symptoms since we might be able to fit you in for a sick visit. Feel free to contact us anytime with any questions, problems, or concerns.  It was a pleasure to see you today!  Websites that have reliable patient information: 1. American Academy of Asthma, Allergy, and Immunology: www.aaaai.org 2. Food Allergy Research and Education (FARE): foodallergy.org 3. Mothers of Asthmatics: http://www.asthmacommunitynetwork.org 4. American College of Allergy, Asthma, and Immunology: www.acaai.org   COVID-19 Vaccine Information can be found at: [email or web address] For questions related to vaccine distribution or appointments, please email [email or web address] or call [phone].   We realize that you might be concerned about having an allergic reaction to the COVID19 vaccines. To help with that concern, WE ARE OFFERING THE COVID19 VACCINES IN OUR OFFICE! Ask the front desk for dates!     "Like" [place] on Facebook and Instagram for our latest updates!      A healthy democracy works best when [employer] participate! Make sure you are registered to vote! If you have moved or changed any of your contact information, you will need to get this updated before voting!  In some cases, you MAY be able to register to vote online: [email or web address]

## 2023-06-23 NOTE — Addendum Note (Signed)
Addended by: Kellie Simmering, Deondria Puryear on: 06/23/2023 03:13 PM   Modules accepted: Orders

## 2023-06-26 ENCOUNTER — Ambulatory Visit (INDEPENDENT_AMBULATORY_CARE_PROVIDER_SITE_OTHER): Payer: Federal, State, Local not specified - PPO | Admitting: *Deleted

## 2023-06-26 DIAGNOSIS — N951 Menopausal and female climacteric states: Secondary | ICD-10-CM | POA: Diagnosis not present

## 2023-06-26 DIAGNOSIS — E039 Hypothyroidism, unspecified: Secondary | ICD-10-CM | POA: Diagnosis not present

## 2023-06-26 DIAGNOSIS — J309 Allergic rhinitis, unspecified: Secondary | ICD-10-CM | POA: Diagnosis not present

## 2023-06-26 NOTE — Progress Notes (Signed)
Aeroallergen Immunotherapy  Ordering Provider: Dr. Malachi Bonds  Patient Details Name: Lisa Townsend MRN: [id number] Date of Birth: [birth year]  Order 1 of 2  Vial Label: G-RW-T-C  0.3 ml (Volume)  BAU Concentration -- 7 Grass Mix* 100,000 ([address] [place], [place], [place], [place], [place], Sweet [place], [place]) 0.2 ml (Volume)  1:20 Concentration -- [place] 0.3 ml (Volume)  BAU Concentration -- [place] 10,000 0.2 ml (Volume)  1:20 Concentration -- [place] 0.3 ml (Volume)  1:20 Concentration -- Ragweed Mix 0.5 ml (Volume)  1:20 Concentration -- Eastern 10 Tree Mix (also Sweet Gum) 0.2 ml (Volume)  1:10 Concentration -- Pine Mix 1.0 ml (Volume)  1:10 Concentration -- Cat Hair  3.0  ml Extract Subtotal 2.0  ml Diluent 5.0  ml Maintenance Total  Schedule:  C  Red Vial (1:100): Schedule C (5 doses)  Special Instructions: Ok to up dose new vials at 0.58mL --> 0.3 mL --> 0.5 mL.

## 2023-06-26 NOTE — Progress Notes (Signed)
 VIAL TO BE MADE WHEN NEEDED.

## 2023-06-28 ENCOUNTER — Ambulatory Visit: Payer: Federal, State, Local not specified - PPO | Admitting: Psychiatry

## 2023-07-03 ENCOUNTER — Ambulatory Visit (INDEPENDENT_AMBULATORY_CARE_PROVIDER_SITE_OTHER): Payer: Federal, State, Local not specified - PPO

## 2023-07-03 DIAGNOSIS — J309 Allergic rhinitis, unspecified: Secondary | ICD-10-CM

## 2023-07-04 DIAGNOSIS — N951 Menopausal and female climacteric states: Secondary | ICD-10-CM | POA: Diagnosis not present

## 2023-07-04 DIAGNOSIS — K5904 Chronic idiopathic constipation: Secondary | ICD-10-CM | POA: Diagnosis not present

## 2023-07-04 DIAGNOSIS — R5383 Other fatigue: Secondary | ICD-10-CM | POA: Diagnosis not present

## 2023-07-04 DIAGNOSIS — N898 Other specified noninflammatory disorders of vagina: Secondary | ICD-10-CM | POA: Diagnosis not present

## 2023-07-06 ENCOUNTER — Encounter: Payer: Self-pay | Admitting: Radiology

## 2023-07-06 ENCOUNTER — Ambulatory Visit: Payer: Federal, State, Local not specified - PPO | Admitting: Radiology

## 2023-07-06 VITALS — BP 110/70

## 2023-07-06 DIAGNOSIS — N760 Acute vaginitis: Secondary | ICD-10-CM

## 2023-07-06 DIAGNOSIS — F411 Generalized anxiety disorder: Secondary | ICD-10-CM | POA: Diagnosis not present

## 2023-07-06 DIAGNOSIS — F330 Major depressive disorder, recurrent, mild: Secondary | ICD-10-CM | POA: Diagnosis not present

## 2023-07-06 DIAGNOSIS — K5909 Other constipation: Secondary | ICD-10-CM | POA: Diagnosis not present

## 2023-07-06 LAB — WET PREP FOR TRICH, YEAST, CLUE

## 2023-07-06 MED ORDER — NYSTATIN-TRIAMCINOLONE 100000-0.1 UNIT/GM-% EX OINT: 1.0000 | TOPICAL_OINTMENT | Freq: Two times a day (BID) | CUTANEOUS | 0 refills | Status: DC

## 2023-07-06 MED ORDER — FLUCONAZOLE 150 MG PO TABS: 150.0000 mg | ORAL_TABLET | ORAL | 0 refills | Status: DC

## 2023-07-06 MED ORDER — METRONIDAZOLE 500 MG PO TABS
500.0000 mg | ORAL_TABLET | Freq: Two times a day (BID) | ORAL | 0 refills | Status: DC
Start: 2023-07-06 — End: 2024-03-22

## 2023-07-06 NOTE — Progress Notes (Signed)
      Subjective: Lisa Townsend is a 47 y.o. female who complains of vaginal itching, labial swelling, discharge, no odor. Symptoms began 1 week ago. Used over the counter vaginal yeast treatment (last dose 3 days ago) without relief.    Review of Systems  All other systems reviewed and are negative.   Past Medical History:  Diagnosis Date   Allergy    seasonal- allergy shots weekly to [facility]   Anxiety    Chronic constipation    daily to weekly constipation-  she states has a BM 2-3 x a week   Hypothyroidism    Migraine    Urticaria    Vitamin D deficiency       Objective:  Today's Vitals   07/06/23 1454  BP: 110/70   There is no height or weight on file to calculate BMI.   -General: no acute distress -Vulva: without lesions or discharge +swelling and erythema -Vagina: discharge present, wet prep obtained -Cervix: no lesion or discharge, no CMT -Perineum: no lesions -Uterus: Mobile, non tender -Adnexa: no masses or tenderness   Microscopic wet-mount exam shows clue cells, hyphae.   Raynelle Fanning, CMA present for exam  Assessment:/Plan:  1. Vulvovaginitis  - WET PREP FOR TRICH, YEAST, CLUE - nystatin-triamcinolone ointment (MYCOLOG); Apply 1 Application topically 2 (two) times daily.  Dispense: 30 g; Refill: 0 - metroNIDAZOLE (FLAGYL) 500 MG tablet; Take 1 tablet (500 mg total) by mouth 2 (two) times daily.  Dispense: 14 tablet; Refill: 0 - fluconazole (DIFLUCAN) 150 MG tablet; Take 1 tablet (150 mg total) by mouth every 3 (three) days.  Dispense: 3 tablet; Refill: 0   Baking soda soaks for comfort.Avoid intercourse until symptoms are resolved. Safe sex encouraged. Avoid the use of soaps or perfumed products in the peri area. Avoid tub baths and sitting in sweaty or wet clothing for prolonged periods of time.    Schedule AEX   Arlie Solomons, [facility]

## 2023-07-14 ENCOUNTER — Ambulatory Visit (INDEPENDENT_AMBULATORY_CARE_PROVIDER_SITE_OTHER): Payer: Federal, State, Local not specified - PPO | Admitting: *Deleted

## 2023-07-14 DIAGNOSIS — J309 Allergic rhinitis, unspecified: Secondary | ICD-10-CM | POA: Diagnosis not present

## 2023-07-20 ENCOUNTER — Ambulatory Visit (INDEPENDENT_AMBULATORY_CARE_PROVIDER_SITE_OTHER): Payer: Federal, State, Local not specified - PPO

## 2023-07-20 DIAGNOSIS — J309 Allergic rhinitis, unspecified: Secondary | ICD-10-CM

## 2023-07-27 DIAGNOSIS — F411 Generalized anxiety disorder: Secondary | ICD-10-CM | POA: Diagnosis not present

## 2023-07-27 DIAGNOSIS — K5909 Other constipation: Secondary | ICD-10-CM | POA: Diagnosis not present

## 2023-07-27 DIAGNOSIS — F330 Major depressive disorder, recurrent, mild: Secondary | ICD-10-CM | POA: Diagnosis not present

## 2023-08-03 ENCOUNTER — Ambulatory Visit (INDEPENDENT_AMBULATORY_CARE_PROVIDER_SITE_OTHER): Payer: Federal, State, Local not specified - PPO

## 2023-08-03 DIAGNOSIS — J309 Allergic rhinitis, unspecified: Secondary | ICD-10-CM

## 2023-08-15 ENCOUNTER — Ambulatory Visit (INDEPENDENT_AMBULATORY_CARE_PROVIDER_SITE_OTHER): Payer: Federal, State, Local not specified - PPO | Admitting: *Deleted

## 2023-08-15 DIAGNOSIS — J309 Allergic rhinitis, unspecified: Secondary | ICD-10-CM | POA: Diagnosis not present

## 2023-08-28 ENCOUNTER — Ambulatory Visit (INDEPENDENT_AMBULATORY_CARE_PROVIDER_SITE_OTHER): Payer: Federal, State, Local not specified - PPO

## 2023-08-28 DIAGNOSIS — J309 Allergic rhinitis, unspecified: Secondary | ICD-10-CM | POA: Diagnosis not present

## 2023-08-31 DIAGNOSIS — J301 Allergic rhinitis due to pollen: Secondary | ICD-10-CM | POA: Diagnosis not present

## 2023-08-31 NOTE — Progress Notes (Signed)
VIALS EXP 08-30-24

## 2023-09-05 ENCOUNTER — Encounter: Payer: Federal, State, Local not specified - PPO | Admitting: Radiology

## 2023-09-15 ENCOUNTER — Ambulatory Visit (INDEPENDENT_AMBULATORY_CARE_PROVIDER_SITE_OTHER): Payer: Federal, State, Local not specified - PPO

## 2023-09-15 DIAGNOSIS — J309 Allergic rhinitis, unspecified: Secondary | ICD-10-CM | POA: Diagnosis not present

## 2023-09-18 DIAGNOSIS — N951 Menopausal and female climacteric states: Secondary | ICD-10-CM | POA: Diagnosis not present

## 2023-09-18 DIAGNOSIS — E039 Hypothyroidism, unspecified: Secondary | ICD-10-CM | POA: Diagnosis not present

## 2023-09-20 DIAGNOSIS — E039 Hypothyroidism, unspecified: Secondary | ICD-10-CM | POA: Diagnosis not present

## 2023-09-20 DIAGNOSIS — N951 Menopausal and female climacteric states: Secondary | ICD-10-CM | POA: Diagnosis not present

## 2023-09-20 DIAGNOSIS — R5383 Other fatigue: Secondary | ICD-10-CM | POA: Diagnosis not present

## 2023-09-20 DIAGNOSIS — N898 Other specified noninflammatory disorders of vagina: Secondary | ICD-10-CM | POA: Diagnosis not present

## 2023-10-10 ENCOUNTER — Ambulatory Visit: Payer: Federal, State, Local not specified - PPO | Admitting: Radiology

## 2023-10-17 ENCOUNTER — Ambulatory Visit (INDEPENDENT_AMBULATORY_CARE_PROVIDER_SITE_OTHER): Payer: Federal, State, Local not specified - PPO

## 2023-10-17 DIAGNOSIS — J309 Allergic rhinitis, unspecified: Secondary | ICD-10-CM

## 2023-11-10 ENCOUNTER — Ambulatory Visit (INDEPENDENT_AMBULATORY_CARE_PROVIDER_SITE_OTHER): Payer: Self-pay | Admitting: *Deleted

## 2023-11-10 DIAGNOSIS — J309 Allergic rhinitis, unspecified: Secondary | ICD-10-CM | POA: Diagnosis not present

## 2023-11-28 ENCOUNTER — Ambulatory Visit: Payer: Federal, State, Local not specified - PPO | Admitting: Radiology

## 2023-12-08 ENCOUNTER — Other Ambulatory Visit: Payer: Self-pay | Admitting: Family Medicine

## 2023-12-08 ENCOUNTER — Ambulatory Visit (INDEPENDENT_AMBULATORY_CARE_PROVIDER_SITE_OTHER): Payer: Federal, State, Local not specified - PPO | Admitting: *Deleted

## 2023-12-08 DIAGNOSIS — J309 Allergic rhinitis, unspecified: Secondary | ICD-10-CM | POA: Diagnosis not present

## 2023-12-15 ENCOUNTER — Ambulatory Visit (INDEPENDENT_AMBULATORY_CARE_PROVIDER_SITE_OTHER): Payer: Federal, State, Local not specified - PPO

## 2023-12-15 DIAGNOSIS — J309 Allergic rhinitis, unspecified: Secondary | ICD-10-CM | POA: Diagnosis not present

## 2023-12-18 ENCOUNTER — Other Ambulatory Visit: Payer: Self-pay | Admitting: Allergy & Immunology

## 2023-12-22 ENCOUNTER — Ambulatory Visit (INDEPENDENT_AMBULATORY_CARE_PROVIDER_SITE_OTHER): Payer: Federal, State, Local not specified - PPO

## 2023-12-22 DIAGNOSIS — J309 Allergic rhinitis, unspecified: Secondary | ICD-10-CM | POA: Diagnosis not present

## 2023-12-22 MED ORDER — LEVOCETIRIZINE DIHYDROCHLORIDE 5 MG PO TABS: 5.0000 mg | ORAL_TABLET | Freq: Every evening | ORAL | 5 refills | Status: DC

## 2024-01-14 ENCOUNTER — Other Ambulatory Visit: Payer: Self-pay | Admitting: Family Medicine

## 2024-01-15 NOTE — Telephone Encounter (Signed)
Left VM to rtn call. Dm/cma       

## 2024-01-16 ENCOUNTER — Ambulatory Visit (INDEPENDENT_AMBULATORY_CARE_PROVIDER_SITE_OTHER): Payer: Self-pay | Admitting: *Deleted

## 2024-01-16 DIAGNOSIS — J309 Allergic rhinitis, unspecified: Secondary | ICD-10-CM | POA: Diagnosis not present

## 2024-02-15 ENCOUNTER — Ambulatory Visit (INDEPENDENT_AMBULATORY_CARE_PROVIDER_SITE_OTHER): Payer: Self-pay

## 2024-02-15 DIAGNOSIS — J309 Allergic rhinitis, unspecified: Secondary | ICD-10-CM | POA: Diagnosis not present

## 2024-03-07 ENCOUNTER — Encounter: Payer: Self-pay | Admitting: Radiology

## 2024-03-07 ENCOUNTER — Ambulatory Visit: Admitting: Radiology

## 2024-03-07 DIAGNOSIS — N760 Acute vaginitis: Secondary | ICD-10-CM

## 2024-03-07 LAB — WET PREP FOR TRICH, YEAST, CLUE

## 2024-03-07 MED ORDER — NYSTATIN-TRIAMCINOLONE 100000-0.1 UNIT/GM-% EX OINT: 1.0000 | TOPICAL_OINTMENT | Freq: Two times a day (BID) | CUTANEOUS | 0 refills | Status: DC

## 2024-03-07 MED ORDER — FLUCONAZOLE 150 MG PO TABS
150.0000 mg | ORAL_TABLET | ORAL | 0 refills | Status: DC
Start: 2024-03-07 — End: 2024-03-22

## 2024-03-07 NOTE — Progress Notes (Signed)
      Subjective: Lisa Townsend is a 48 y.o. female who complains of vulvovaginal and perineal itching x 1 month. No discharge or urinary symptoms.  Review of Systems  All other systems reviewed and are negative.   Past Medical History:  Diagnosis Date   Allergy    seasonal- allergy shots weekly to [facility]   Anxiety    Chronic constipation    daily to weekly constipation-  she states has a BM 2-3 x a week   Hypothyroidism    Migraine    Urticaria    Vitamin D  deficiency       Objective:  Today's Vitals   03/07/24 1004  BP: 104/62  Weight: 144 lb 12.8 oz (65.7 kg)   Body mass index is 27.81 kg/m.   -General: no acute distress -Vulva: without lesions or discharge +swelling and erythema -Vagina: discharge present, wet prep obtained -Cervix: no lesion or discharge, no CMT -Perineum: erythema -Uterus: Mobile, non tender -Adnexa: no masses or tenderness  Microscopic wet-mount exam shows hyphae.   Ellis Guys, CMA present for exam  Assessment:/Plan:  1. Vulvovaginitis - nystatin -triamcinolone  ointment (MYCOLOG); Apply 1 Application topically 2 (two) times daily.  Dispense: 30 g; Refill: 0 - WET PREP FOR TRICH, YEAST, CLUE - fluconazole  (DIFLUCAN ) 150 MG tablet; Take 1 tablet (150 mg total) by mouth every 3 (three) days.  Dispense: 3 tablet; Refill: 0   Baking soda soaks for comfort.Avoid intercourse until symptoms are resolved. Safe sex encouraged. Avoid the use of soaps or perfumed products in the peri area. Avoid tub baths and sitting in sweaty or wet clothing for prolonged periods of time.    Schedule AEX   Synetta Eves, [facility] 03/07/24 1017

## 2024-03-12 ENCOUNTER — Ambulatory Visit: Admitting: Radiology

## 2024-03-15 ENCOUNTER — Ambulatory Visit (INDEPENDENT_AMBULATORY_CARE_PROVIDER_SITE_OTHER): Payer: Self-pay

## 2024-03-15 ENCOUNTER — Ambulatory Visit: Admitting: Obstetrics and Gynecology

## 2024-03-15 VITALS — BP 124/72 | HR 67 | Temp 98.1°F | Wt 145.0 lb

## 2024-03-15 DIAGNOSIS — N905 Atrophy of vulva: Secondary | ICD-10-CM | POA: Diagnosis not present

## 2024-03-15 DIAGNOSIS — R300 Dysuria: Secondary | ICD-10-CM

## 2024-03-15 DIAGNOSIS — N3001 Acute cystitis with hematuria: Secondary | ICD-10-CM

## 2024-03-15 DIAGNOSIS — J309 Allergic rhinitis, unspecified: Secondary | ICD-10-CM | POA: Diagnosis not present

## 2024-03-15 DIAGNOSIS — B3731 Acute candidiasis of vulva and vagina: Secondary | ICD-10-CM | POA: Diagnosis not present

## 2024-03-15 MED ORDER — MICONAZOLE NITRATE 2 % VA CREA: 1.0000 | TOPICAL_CREAM | Freq: Every day | VAGINAL | 0 refills | Status: AC

## 2024-03-15 MED ORDER — ESTRADIOL 0.1 MG/GM VA CREA: TOPICAL_CREAM | VAGINAL | 1 refills | Status: DC

## 2024-03-15 NOTE — Progress Notes (Unsigned)
 48 y.o. G38P1001 female here for GU complaints. Married.  Patient's last menstrual period was 12/11/2023 (approximate).   She reports vaginal itching and burning, used cream and medication from last visit but no relief (mycolog).  On pellet HRT. Helping with libido, however now with yeast infection. No topical or oral estrogen.   OB History  Gravida Para Term Preterm AB Living  1 1 1  0 0 1  SAB IAB Ectopic Multiple Live Births  0 0 0 0 1    # Outcome Date GA Lbr Len/2nd Weight Sex Type Anes PTL Lv  1 Term 08/04/21 [redacted]w[redacted]d  6 lb 10.9 oz (3.031 kg) M CS-LTranv Spinal  LIV     Birth Comments: WDL    Past Medical History:  Diagnosis Date   Allergy    seasonal- allergy shots weekly to [facility]   Anxiety    Chronic constipation    daily to weekly constipation-  she states has a BM 2-3 x a week   Hypothyroidism    Migraine    Urticaria    Vitamin D  deficiency     Past Surgical History:  Procedure Laterality Date   BREAST BIOPSY Left 02/03/2014   CESAREAN SECTION N/A 08/04/2021   Procedure: CESAREAN SECTION;  Surgeon: Gaspar Karma, MD;  Location: [facility] LD ORS;  Service: Obstetrics;  Laterality: N/A;   NOSE SURGERY      Current Outpatient Medications on File Prior to Visit  Medication Sig Dispense Refill   cetirizine (ZYRTEC) 10 MG tablet Take 10 mg by mouth daily.     diazepam  (VALIUM ) 10 MG tablet TAKE 1/2 TO 1 TABLET(5 TO 10 MG) BY MOUTH AT BEDTIME AS NEEDED FOR SLEEP 12 tablet 2   eletriptan (RELPAX) 20 MG tablet Take by mouth.     EPINEPHrine  0.3 mg/0.3 mL IJ SOAJ injection Inject 0.3 mg into the muscle once as needed for up to 1 dose for anaphylaxis. 0.3 mL 2   levothyroxine  (SYNTHROID ) 75 MCG tablet TAKE 1 TABLET BY MOUTH EVERY DAY 15 tablet 0   montelukast  (SINGULAIR ) 10 MG tablet TAKE 1 TABLET(10 MG) BY MOUTH AT BEDTIME. 90 tablet 1   topiramate (TOPAMAX) 25 MG tablet Take by mouth.     fluconazole  (DIFLUCAN ) 150 MG tablet Take 1 tablet (150 mg total) by mouth every  3 (three) days. 3 tablet 0   ketorolac  (TORADOL ) 10 MG tablet Take by mouth. (Patient not taking: Reported on 03/15/2024)     levocetirizine (XYZAL ) 5 MG tablet Take 1 tablet (5 mg total) by mouth every evening. (Patient not taking: Reported on 03/15/2024) 30 tablet 5   metroNIDAZOLE  (FLAGYL ) 500 MG tablet Take 1 tablet (500 mg total) by mouth 2 (two) times daily. (Patient not taking: Reported on 03/15/2024) 14 tablet 0   nystatin -triamcinolone  ointment (MYCOLOG) Apply 1 Application topically 2 (two) times daily. (Patient not taking: Reported on 03/15/2024) 30 g 0   No current facility-administered medications on file prior to visit.    Allergies  Allergen Reactions   Claritin [Loratadine]     shakiness   Latex Itching   Lexapro  [Escitalopram ] Nausea Only      PE Today's Vitals   03/15/24 0932  BP: 124/72  Pulse: 67  Temp: 98.1 F (36.7 C)  TempSrc: Oral  SpO2: 98%  Weight: 145 lb (65.8 kg)   Body mass index is 27.85 kg/m.  Physical Exam Vitals reviewed. Exam conducted with a chaperone present.  Constitutional:      General: She  is not in acute distress.    Appearance: Normal appearance.  HENT:     Head: Normocephalic and atraumatic.     Nose: Nose normal.  Eyes:     Extraocular Movements: Extraocular movements intact.     Conjunctiva/sclera: Conjunctivae normal.  Pulmonary:     Effort: Pulmonary effort is normal.  Genitourinary:    General: Normal vulva.     Exam position: Lithotomy position.     Vagina: Vaginal discharge present.     Cervix: Normal. No cervical motion tenderness, discharge or lesion.     Uterus: Normal. Not enlarged and not tender.      Adnexa: Right adnexa normal and left adnexa normal.  Musculoskeletal:        General: Normal range of motion.     Cervical back: Normal range of motion.  Neurological:     General: No focal deficit present.     Mental Status: She is alert.  Psychiatric:        Mood and Affect: Mood normal.        Behavior:  Behavior normal.       Assessment and Plan:        Burning with urination -     Urinalysis,Complete w/RFL Culture  Yeast vaginitis -     SureSwab Advanced Candida Vaginitis (CV), TMA -     Miconazole Nitrate; Place 1 Applicatorful vaginally at bedtime for 7 days.  Dispense: 45 g; Refill: 0  Vulvar atrophy -     Estradiol ; Apply 1/2 gram to vulva nightly for 2 weeks then decrease to 1/2 gram to vulva two nights a week. Do not use applicator.  Dispense: 42.5 g; Refill: 1  Acute cystitis with hematuria -     Nitrofurantoin  Monohyd Macro; Take 1 capsule (100 mg total) by mouth 2 (two) times daily for 5 days.  Dispense: 10 capsule; Refill: 0  Other orders -     Urine Culture -     REFLEXIVE URINE CULTURE   Recently treated for yeast infection without improvement. Will send out yeast cx and switch to miconazole while awaiting results. Given recurrent vaginal complaints, recommend PV estradiol   Romaine Closs, MD

## 2024-03-16 LAB — SURESWAB® ADVANCED CANDIDA VAGINITIS (CV), TMA
CANDIDA SPECIES: NOT DETECTED
Candida glabrata: NOT DETECTED

## 2024-03-18 ENCOUNTER — Encounter: Payer: Self-pay | Admitting: Obstetrics and Gynecology

## 2024-03-18 LAB — URINALYSIS, COMPLETE W/RFL CULTURE
Bilirubin Urine: NEGATIVE
Glucose, UA: NEGATIVE
Hgb urine dipstick: NEGATIVE
Hyaline Cast: NONE SEEN /LPF
Ketones, ur: NEGATIVE
Nitrites, Initial: NEGATIVE
Protein, ur: NEGATIVE
RBC / HPF: NONE SEEN /HPF (ref 0–2)
Specific Gravity, Urine: 1.007 (ref 1.001–1.035)
pH: 5.5 (ref 5.0–8.0)

## 2024-03-18 LAB — URINE CULTURE
MICRO NUMBER:: 16435852
SPECIMEN QUALITY:: ADEQUATE

## 2024-03-18 LAB — CULTURE INDICATED

## 2024-03-18 MED ORDER — NITROFURANTOIN MONOHYD MACRO 100 MG PO CAPS: 100.0000 mg | ORAL_CAPSULE | Freq: Two times a day (BID) | ORAL | 0 refills | Status: AC

## 2024-03-22 ENCOUNTER — Encounter: Payer: Self-pay | Admitting: Obstetrics and Gynecology

## 2024-04-12 ENCOUNTER — Ambulatory Visit (INDEPENDENT_AMBULATORY_CARE_PROVIDER_SITE_OTHER): Payer: Self-pay

## 2024-04-12 DIAGNOSIS — J309 Allergic rhinitis, unspecified: Secondary | ICD-10-CM | POA: Diagnosis not present

## 2024-04-30 ENCOUNTER — Encounter: Payer: Self-pay | Admitting: Radiology

## 2024-04-30 ENCOUNTER — Other Ambulatory Visit (HOSPITAL_COMMUNITY)
Admission: RE | Admit: 2024-04-30 | Discharge: 2024-04-30 | Disposition: A | Discharge status: Home / Self Care | Source: Ambulatory Visit | Attending: Radiology | Admitting: Radiology

## 2024-04-30 ENCOUNTER — Ambulatory Visit (INDEPENDENT_AMBULATORY_CARE_PROVIDER_SITE_OTHER): Admitting: Radiology

## 2024-04-30 VITALS — BP 102/66 | HR 73 | Ht 60.75 in | Wt 143.0 lb

## 2024-04-30 DIAGNOSIS — Z1331 Encounter for screening for depression: Secondary | ICD-10-CM

## 2024-04-30 DIAGNOSIS — Z01419 Encounter for gynecological examination (general) (routine) without abnormal findings: Secondary | ICD-10-CM

## 2024-04-30 DIAGNOSIS — N951 Menopausal and female climacteric states: Secondary | ICD-10-CM | POA: Diagnosis not present

## 2024-04-30 MED ORDER — NONFORMULARY OR COMPOUNDED ITEM
5 refills | Status: AC
Start: 2024-04-30 — End: ?

## 2024-04-30 MED ORDER — ESTRADIOL 0.1 MG/GM VA CREA: 1.0000 g | TOPICAL_CREAM | VAGINAL | 2 refills | Status: DC

## 2024-04-30 MED ORDER — ESTRADIOL 0.0375 MG/24HR TD PTTW: 1.0000 | MEDICATED_PATCH | TRANSDERMAL | 1 refills | Status: DC

## 2024-04-30 MED ORDER — PROGESTERONE MICRONIZED 100 MG PO CAPS: 100.0000 mg | ORAL_CAPSULE | Freq: Every day | ORAL | 0 refills | Status: DC

## 2024-04-30 NOTE — Addendum Note (Signed)
 Addended by: ROSALVA DARICE BROCKS on: 04/30/2024 09:58 AM   Modules accepted: Orders

## 2024-04-30 NOTE — Progress Notes (Signed)
 Lisa Townsend [facility] [birth year] [id number]   History:  48 y.o. G1P1 presents for annual exam. C/o fatigue, joint pain, irregular periods. Was getting testosterone  pellets at [facility], would like to switch to cream due to cost. Using estrace  cream twice weekly for vulvar atrophy.  Gynecologic History Patient's last menstrual period was 12/09/2023 (approximate). Period Cycle (Days):  (skipping periods since 06/2023) Period Duration (Days): 3 Period Pattern: (!) Irregular Menstrual Flow: Moderate Menstrual Control: Thin pad, Maxi pad Dysmenorrhea: None Contraception/Family planning: none Sexually active: yes Last Pap: 2021. Results were: normal Last mammogram: 2023. Results were: normal  Obstetric History OB History  Gravida Para Term Preterm AB Living  1 1 1  0 0 1  SAB IAB Ectopic Multiple Live Births  0 0 0 0 1    # Outcome Date GA Lbr Len/2nd Weight Sex Type Anes PTL Lv  1 Term 08/04/21 [redacted]w[redacted]d  6 lb 10.9 oz (3.031 kg) M CS-LTranv Spinal  LIV     Birth Comments: WDL       04/30/2024    9:08 AM 11/04/2022   10:51 AM 12/29/2021    9:40 AM  Depression screen PHQ 2/9  Decreased Interest 0 1 0  Down, Depressed, Hopeless 0 1 1  PHQ - 2 Score 0 2 1  Altered sleeping  2   Tired, decreased energy  3   Change in appetite  1   Feeling bad or failure about yourself   0   Trouble concentrating  0   Moving slowly or fidgety/restless  0   Suicidal thoughts  0   PHQ-9 Score  8   Difficult doing work/chores  Somewhat difficult      The following portions of the patient's history were reviewed and updated as appropriate: allergies, current medications, past family history, past medical history, past social history, past surgical history, and problem list.  Review of Systems  All other systems reviewed and are negative.   Past medical history, past surgical history, family history and social history were all reviewed and documented in the EPIC chart.  Exam:  Vitals:   04/30/24  0906  BP: 102/66  Pulse: 73  SpO2: 97%  Weight: 143 lb (64.9 kg)  Height: 5' 0.75 (1.543 m)   Body mass index is 27.24 kg/m.  Physical Exam Vitals and nursing note reviewed. Exam conducted with a chaperone present.  Constitutional:      Appearance: Normal appearance. She is normal weight.  HENT:     Head: Normocephalic and atraumatic.  Neck:     Thyroid : No thyroid  mass, thyromegaly or thyroid  tenderness.   Cardiovascular:     Rate and Rhythm: Regular rhythm.     Heart sounds: Normal heart sounds.  Pulmonary:     Effort: Pulmonary effort is normal.     Breath sounds: Normal breath sounds.  Chest:  Breasts:    Breasts are symmetrical.     Right: Normal. No inverted nipple, mass, nipple discharge, skin change or tenderness.     Left: Normal. No inverted nipple, mass, nipple discharge, skin change or tenderness.  Abdominal:     General: Abdomen is flat. Bowel sounds are normal.     Palpations: Abdomen is soft.  Genitourinary:    General: Normal vulva.     Vagina: Normal. No vaginal discharge, bleeding or lesions.     Cervix: Normal. No discharge or lesion.     Uterus: Normal. Not enlarged and not tender.      Adnexa: Right adnexa  normal and left adnexa normal.       Right: No mass, tenderness or fullness.         Left: No mass, tenderness or fullness.    Lymphadenopathy:     Upper Body:     Right upper body: No axillary adenopathy.     Left upper body: No axillary adenopathy.   Skin:    General: Skin is warm and dry.   Neurological:     Mental Status: She is alert and oriented to person, place, and time.   Psychiatric:        Mood and Affect: Mood normal.        Thought Content: Thought content normal.        Judgment: Judgment normal.      Lisa Townsend, CMA present for exam  Assessment/Plan:   1. Well woman exam with routine gynecological exam (Primary) - Schedule overdue mammogram - Cytology - PAP( [place])  2. Perimenopausal symptoms Will begin  bio identical HRT to manage symptoms. Risks and benefits reviewed with patient. Follow up 3 months - estradiol  (VIVELLE -DOT) 0.0375 MG/24HR; Place 1 patch onto the skin 2 (two) times a week.  Dispense: 24 patch; Refill: 1 - progesterone  (PROMETRIUM ) 100 MG capsule; Take 1 capsule (100 mg total) by mouth daily.  Dispense: 90 capsule; Refill: 0 - estradiol  (ESTRACE  VAGINAL) 0.1 MG/GM vaginal cream; Place 1 g vaginally 2 (two) times a week. And apply a thin layer to the vulva  Dispense: 42.5 g; Refill: 2  3. Screening for depression Managed by PCP    Discussed SBE, colonoscopy and pap screening as directed/appropriate. Recommend of exercise weekly, including weight bearing exercise. Encouraged the use of seatbelts and sunscreen.  Return in about 3 months (around 07/31/2024) for Med Follow-up.  Lisa Townsend WHNP-BC 9:34 AM 04/30/2024

## 2024-04-30 NOTE — Patient Instructions (Signed)
 Preventive Care 16-49 Years Old, Female  Preventive care refers to lifestyle choices and visits with your health care provider that can promote health and wellness. Preventive care visits are also called wellness exams.  What can I expect for my preventive care visit?  Counseling  Your health care provider may ask you questions about your:  Medical history, including:  Past medical problems.  Family medical history.  Pregnancy history.  Current health, including:  Menstrual cycle.  Method of birth control.  Emotional well-being.  Home life and relationship well-being.  Sexual activity and sexual health.  Lifestyle, including:  Alcohol, nicotine or tobacco, and drug use.  Access to firearms.  Diet, exercise, and sleep habits.  Work and work Astronomer.  Sunscreen use.  Safety issues such as seatbelt and bike helmet use.  Physical exam  Your health care provider will check your:  Height and weight. These may be used to calculate your BMI (body mass index). BMI is a measurement that tells if you are at a healthy weight.  Waist circumference. This measures the distance around your waistline. This measurement also tells if you are at a healthy weight and may help predict your risk of certain diseases, such as type 2 diabetes and high blood pressure.  Heart rate and blood pressure.  Body temperature.  Skin for abnormal spots.  What immunizations do I need?    Vaccines are usually given at various ages, according to a schedule. Your health care provider will recommend vaccines for you based on your age, medical history, and lifestyle or other factors, such as travel or where you work.  What tests do I need?  Screening  Your health care provider may recommend screening tests for certain conditions. This may include:  Lipid and cholesterol levels.  Diabetes screening. This is done by checking your blood sugar (glucose) after you have not eaten for a while (fasting).  Pelvic exam and Pap test.  Hepatitis B test.  Hepatitis C  test.  HIV (human immunodeficiency virus) test.  STI (sexually transmitted infection) testing, if you are at risk.  Lung cancer screening.  Colorectal cancer screening.  Mammogram. Talk with your health care provider about when you should start having regular mammograms. This may depend on whether you have a family history of breast cancer.  BRCA-related cancer screening. This may be done if you have a family history of breast, ovarian, tubal, or peritoneal cancers.  Bone density scan. This is done to screen for osteoporosis.  Talk with your health care provider about your test results, treatment options, and if necessary, the need for more tests.  Follow these instructions at home:  Eating and drinking    Eat a diet that includes fresh fruits and vegetables, whole grains, lean protein, and low-fat dairy products.  Take vitamin and mineral supplements as recommended by your health care provider.  Do not drink alcohol if:  Your health care provider tells you not to drink.  You are pregnant, may be pregnant, or are planning to become pregnant.  If you drink alcohol:  Limit how much you have to 0-1 drink a day.  Know how much alcohol is in your drink. In the [place] one drink equals one 12 oz bottle of beer (355 mL), one 5 oz glass of wine (148 mL), or one 1 oz glass of hard liquor (44 mL).  Lifestyle  Brush your teeth every morning and night with fluoride toothpaste. Floss one time each day.  Exercise for at least  30 minutes 5 or more days each week.  Do not use any products that contain nicotine or tobacco. These products include cigarettes, chewing tobacco, and vaping devices, such as e-cigarettes. If you need help quitting, ask your health care provider.  Do not use drugs.  If you are sexually active, practice safe sex. Use a condom or other form of protection to prevent STIs.  If you do not wish to become pregnant, use a form of birth control. If you plan to become pregnant, see your health care provider for a  prepregnancy visit.  Take aspirin only as told by your health care provider. Make sure that you understand how much to take and what form to take. Work with your health care provider to find out whether it is safe and beneficial for you to take aspirin daily.  Find healthy ways to manage stress, such as:  Meditation, yoga, or listening to music.  Journaling.  Talking to a trusted person.  Spending time with friends and family.  Minimize exposure to UV radiation to reduce your risk of skin cancer.  Safety  Always wear your seat belt while driving or riding in a vehicle.  Do not drive:  If you have been drinking alcohol. Do not ride with someone who has been drinking.  When you are tired or distracted.  While texting.  If you have been using any mind-altering substances or drugs.  Wear a helmet and other protective equipment during sports activities.  If you have firearms in your house, make sure you follow all gun safety procedures.  Seek help if you have been physically or sexually abused.  What's next?  Visit your health care provider once a year for an annual wellness visit.  Ask your health care provider how often you should have your eyes and teeth checked.  Stay up to date on all vaccines.  This information is not intended to replace advice given to you by your health care provider. Make sure you discuss any questions you have with your health care provider.  Document Revised: 04/21/2021 Document Reviewed: 04/21/2021  Elsevier Patient Education  2024 [employer].

## 2024-05-01 ENCOUNTER — Ambulatory Visit: Payer: Self-pay | Admitting: Radiology

## 2024-05-01 LAB — CYTOLOGY - PAP
Adequacy: ABSENT
Diagnosis: NEGATIVE

## 2024-05-13 ENCOUNTER — Ambulatory Visit (INDEPENDENT_AMBULATORY_CARE_PROVIDER_SITE_OTHER)

## 2024-05-13 DIAGNOSIS — J309 Allergic rhinitis, unspecified: Secondary | ICD-10-CM | POA: Diagnosis not present

## 2024-06-07 ENCOUNTER — Ambulatory Visit (INDEPENDENT_AMBULATORY_CARE_PROVIDER_SITE_OTHER): Admitting: *Deleted

## 2024-06-07 DIAGNOSIS — J309 Allergic rhinitis, unspecified: Secondary | ICD-10-CM

## 2024-06-11 ENCOUNTER — Other Ambulatory Visit: Payer: Self-pay | Admitting: Obstetrics and Gynecology

## 2024-06-11 DIAGNOSIS — N951 Menopausal and female climacteric states: Secondary | ICD-10-CM

## 2024-06-12 NOTE — Telephone Encounter (Signed)
 Med refill request: estradiol  vaginal cream Last AEX: 04/30/24 JC Next AEX: not yet scheduled Last MMG (if hormonal med) 02/08/22 Refill authorized: Last Rx sent #42.5 gram with 2 refills on 05/02/24. Please approve or deny.

## 2024-06-24 NOTE — Progress Notes (Signed)
 VIALS MADE ON 06/24/24

## 2024-06-25 DIAGNOSIS — J301 Allergic rhinitis due to pollen: Secondary | ICD-10-CM | POA: Diagnosis not present

## 2024-06-25 DIAGNOSIS — J302 Other seasonal allergic rhinitis: Secondary | ICD-10-CM | POA: Diagnosis not present

## 2024-06-25 DIAGNOSIS — J3081 Allergic rhinitis due to animal (cat) (dog) hair and dander: Secondary | ICD-10-CM | POA: Diagnosis not present

## 2024-07-25 ENCOUNTER — Other Ambulatory Visit: Payer: Self-pay | Admitting: Radiology

## 2024-07-25 DIAGNOSIS — N951 Menopausal and female climacteric states: Secondary | ICD-10-CM

## 2024-07-25 NOTE — Telephone Encounter (Signed)
 Med refill request:  estradiol  (vivelle -dot) 0.0375 mg / 24 hr  Starting: 05/02/24 - Dispense: 24 patch with 1 refill  Last AEX:  04/30/24 Next OV:  Med Check - 07/31/24 Next AEX: not yet scheduled Last MMG (if hormonal med): 02/08/22 Refill authorized? Please Advise.

## 2024-07-27 ENCOUNTER — Other Ambulatory Visit: Payer: Self-pay | Admitting: Radiology

## 2024-07-27 DIAGNOSIS — N951 Menopausal and female climacteric states: Secondary | ICD-10-CM

## 2024-07-29 NOTE — Telephone Encounter (Signed)
.  Med refill request: Progesterone   Last AEX:04/30/24 Next OV: 07/31/24 med f/up Last MMG (if hormonal med) 02/08/22 Refill authorized: Please Advise?

## 2024-07-31 ENCOUNTER — Ambulatory Visit
Admission: RE | Admit: 2024-07-31 | Discharge: 2024-07-31 | Disposition: A | Discharge status: Home / Self Care | Attending: Physician Assistant | Admitting: Physician Assistant

## 2024-07-31 ENCOUNTER — Ambulatory Visit: Admitting: Radiology

## 2024-07-31 ENCOUNTER — Other Ambulatory Visit: Payer: Self-pay

## 2024-07-31 VITALS — BP 113/71 | HR 76 | Temp 98.3°F | Resp 17 | Ht 60.0 in | Wt 127.0 lb

## 2024-07-31 DIAGNOSIS — R0989 Other specified symptoms and signs involving the circulatory and respiratory systems: Secondary | ICD-10-CM

## 2024-07-31 LAB — POC COVID19/FLU A&B COMBO
Covid Antigen, POC: NEGATIVE
Influenza A Antigen, POC: NEGATIVE
Influenza B Antigen, POC: NEGATIVE

## 2024-07-31 LAB — POCT RAPID STREP A (OFFICE): Rapid Strep A Screen: NEGATIVE

## 2024-07-31 NOTE — ED Provider Notes (Signed)
 [facility]    CSN: [id number] Arrival date & time: 07/31/24  1035      History   Chief Complaint Chief Complaint  Patient presents with   Nasal Congestion    My nose and throat hurt a lot. - Entered by patient   Cough   Sore Throat    HPI Andreanna Mikolajczak is a 48 y.o. female.  has a past medical history of Allergy, Anxiety, Chronic constipation, Hypothyroidism, Migraine, Urticaria, and Vitamin D  deficiency.   HPI  Pt states she has had sore throat, coughing, sinus pain and loss of sense of taste and smell. She states her loss of taste and smell started yesterday and other symptoms have been ongoing for several days.  She states she started having coughing and sneezing about a week ago.  She reports that her sore throat and sinus congestion and pain started about 3 days ago. She states her son is having similar symptoms. She states she took him to his pediatrician yesterday and he was diagnosed with an infection.  Interventions: She has been taking Vicks cough and cold. She states this has not been helping her symptoms very much     Past Medical History:  Diagnosis Date   Allergy    seasonal- allergy shots weekly to [facility]   Anxiety    Chronic constipation    daily to weekly constipation-  she states has a BM 2-3 x a week   Hypothyroidism    Migraine    Urticaria    Vitamin D  deficiency     Patient Active Problem List   Diagnosis Date Noted   Genital herpes simplex 11/04/2022   Immunotherapy 11/04/2022   History of infertility- ovarian insufficiency 12/29/2021   Seasonal and perennial allergic rhinoconjunctivitis 07/14/2020   Primary insomnia 08/02/2017   Constipation 10/13/2016   Sciatica of right side 08/02/2016   H/O vitamin D  deficiency 06/08/2016   Generalized anxiety disorder 05/21/2015   Chronic migraine without aura without status migrainosus, not intractable 05/05/2015   Hypothyroidism 01/06/2014    Past Surgical History:  Procedure  Laterality Date   BREAST BIOPSY Left 02/03/2014   CESAREAN SECTION N/A 08/04/2021   Procedure: CESAREAN SECTION;  Surgeon: Sudie Lavonia HERO, MD;  Location: [facility] LD ORS;  Service: Obstetrics;  Laterality: N/A;   NOSE SURGERY      OB History     Gravida  1   Para  1   Term  1   Preterm  0   AB  0   Living  1      SAB  0   IAB  0   Ectopic  0   Multiple  0   Live Births  1            Home Medications    Prior to Admission medications   Medication Sig Start Date End Date Taking? Authorizing Provider  cetirizine (ZYRTEC) 10 MG tablet Take 10 mg by mouth daily. Patient not taking: Reported on 04/30/2024    [provider]  diazepam  (VALIUM ) 10 MG tablet TAKE 1/2 TO 1 TABLET(5 TO 10 MG) BY MOUTH AT BEDTIME AS NEEDED FOR SLEEP 02/06/23   Thedora Garnette HERO, MD  eletriptan (RELPAX) 20 MG tablet Take by mouth.    [provider]  EPINEPHrine  0.3 mg/0.3 mL IJ SOAJ injection Inject 0.3 mg into the muscle once as needed for up to 1 dose for anaphylaxis. 06/22/23   Iva Marty Saltness, MD  estradiol  (ESTRACE )  0.1 MG/GM vaginal cream APPLY 1/2 GRAM TO VULVA NIGHTLY FOR 2 WEEKS THEN DECREASE TO 1/2 GRAM TO VULVA TWO NIGHTS A WEEK 06/12/24   Chrzanowski, Jami B, NP  estradiol  (VIVELLE -DOT) 0.0375 MG/24HR APPLY 1 PATCH TOPICALLY TO THE SKIN 2 TIMES A WEEK 07/25/24   Chrzanowski, Jami B, NP  levothyroxine  (SYNTHROID ) 75 MCG tablet TAKE 1 TABLET BY MOUTH EVERY DAY 01/15/24   Thedora Garnette HERO, MD  liothyronine (CYTOMEL) 5 MCG tablet Take 5 mcg by mouth 2 (two) times daily.    [provider]  lubiprostone  (AMITIZA ) 24 MCG capsule Take 24 mcg by mouth. 11/30/23   [provider]  montelukast  (SINGULAIR ) 10 MG tablet TAKE 1 TABLET(10 MG) BY MOUTH AT BEDTIME. 12/18/23   Iva Marty Saltness, MD  NONFORMULARY OR COMPOUNDED ITEM Compounded Testosterone  PLO GEL 4% Apply pea sized amount to inner thigh qhs 04/30/24   Chrzanowski, Jami B, NP  progesterone   (PROMETRIUM ) 100 MG capsule TAKE 1 CAPSULE(100 MG) BY MOUTH DAILY 07/29/24   Chrzanowski, Jami B, NP  sertraline  (ZOLOFT ) 50 MG tablet Take 50 mg by mouth daily.    [provider]  topiramate (TOPAMAX) 25 MG tablet Take by mouth. 06/14/23   [provider]    Family History Family History  Problem Relation Age of Onset   Cancer Mother    Hypertension Mother    Breast cancer Mother 63   Urolithiasis Father    Allergic rhinitis Brother    Alcohol abuse Maternal Uncle    Prostate cancer Paternal Uncle        mid 62s   Breast cancer Maternal Grandmother        dx under 50   Stomach cancer Paternal Grandmother        dx in her 76s   Asthma Neg Hx    Eczema Neg Hx    Urticaria Neg Hx    Colon cancer Neg Hx    Colon polyps Neg Hx    Esophageal cancer Neg Hx    Rectal cancer Neg Hx     Social History Social History   Tobacco Use   Smoking status: Never    Passive exposure: Past   Smokeless tobacco: Never  Vaping Use   Vaping status: Never Used  Substance Use Topics   Alcohol use: Yes    Comment: rare   Drug use: No     Allergies   Claritin [loratadine], Latex, and Lexapro  [escitalopram ]   Review of Systems Review of Systems  Constitutional:  Positive for chills and fatigue. Negative for fever.  HENT:  Positive for congestion, ear pain (with coughing), sinus pressure, sinus pain and sore throat.   Respiratory:  Positive for cough. Negative for shortness of breath and wheezing.   Gastrointestinal:  Negative for diarrhea, nausea and vomiting.  Musculoskeletal:  Positive for myalgias. Negative for arthralgias.     Physical Exam Triage Vital Signs ED Triage Vitals  Encounter Vitals Group     BP 07/31/24 1056 113/71     Girls Systolic BP Percentile --      Girls Diastolic BP Percentile --      Boys Systolic BP Percentile --      Boys Diastolic BP Percentile --      Pulse Rate 07/31/24 1056 76     Resp 07/31/24 1056 17     Temp 07/31/24 1056 98.3  F (36.8 C)     Temp Source 07/31/24 1056 Oral     SpO2 07/31/24 1056 97 %  Weight 07/31/24 1055 127 lb (57.6 kg)     Height 07/31/24 1055 5' (1.524 m)     Head Circumference --      Peak Flow --      Pain Score 07/31/24 1054 7     Pain Loc --      Pain Education --      Exclude from Growth Chart --    No data found.  Updated Vital Signs BP 113/71 (BP Location: Right Arm)   Pulse 76   Temp 98.3 F (36.8 C) (Oral)   Resp 17   Ht 5' (1.524 m)   Wt 127 lb (57.6 kg)   LMP  (Within Months)   SpO2 97%   BMI 24.80 kg/m   Visual Acuity Right Eye Distance:   Left Eye Distance:   Bilateral Distance:    Right Eye Near:   Left Eye Near:    Bilateral Near:     Physical Exam Vitals reviewed.  Constitutional:      General: She is awake. She is not in acute distress.    Appearance: Normal appearance. She is well-developed and well-groomed. She is ill-appearing. She is not toxic-appearing or diaphoretic.     Comments: Patient is mildly ill-appearing but does not appear to be toxic or in acute distress.  HENT:     Head: Normocephalic and atraumatic.     Right Ear: Hearing, tympanic membrane and ear canal normal.     Left Ear: Hearing, tympanic membrane and ear canal normal.     Mouth/Throat:     Lips: Pink.     Mouth: Mucous membranes are moist.     Pharynx: Oropharynx is clear. Uvula midline. Posterior oropharyngeal erythema and postnasal drip present. No pharyngeal swelling, oropharyngeal exudate or uvula swelling.     Tonsils: No tonsillar exudate. 0 on the right. 0 on the left.  Cardiovascular:     Rate and Rhythm: Normal rate and regular rhythm.     Pulses: Normal pulses.          Radial pulses are 2+ on the right side and 2+ on the left side.     Heart sounds: Normal heart sounds. No murmur heard.    No friction rub. No gallop.  Pulmonary:     Effort: Pulmonary effort is normal.     Breath sounds: Normal breath sounds. No decreased air movement. No decreased breath  sounds, wheezing, rhonchi or rales.  Musculoskeletal:     Cervical back: Normal range of motion and neck supple.  Lymphadenopathy:     Head:     Right side of head: No submental, submandibular or preauricular adenopathy.     Left side of head: No submental, submandibular or preauricular adenopathy.     Cervical:     Right cervical: No superficial cervical adenopathy.    Left cervical: No superficial cervical adenopathy.     Upper Body:     Right upper body: No supraclavicular adenopathy.     Left upper body: No supraclavicular adenopathy.  Neurological:     General: No focal deficit present.     Mental Status: She is alert and oriented to person, place, and time.  Psychiatric:        Mood and Affect: Mood normal.        Behavior: Behavior normal. Behavior is cooperative.      UC Treatments / Results  Labs (all labs ordered are listed, but only abnormal results are displayed) Labs Reviewed  POC COVID19/FLU A&B  COMBO  POCT RAPID STREP A (OFFICE)    EKG   Radiology No results found.  Procedures Procedures (including critical care time)  Medications Ordered in UC Medications - No data to display  Initial Impression / Assessment and Plan / UC Course  I have reviewed the triage vital signs and the nursing notes.  Pertinent labs & imaging results that were available during my care of the patient were reviewed by me and considered in my medical decision making (see chart for details).      Final Clinical Impressions(s) / UC Diagnoses   Final diagnoses:  Symptoms of upper respiratory infection (URI)    Visit with patient indicates symptoms comprised of nasal congestion, sinus pain, sore throat, loss of sense of taste and smell ongoing for several days congruent with acute URI that is likely viral in nature  Patient has tested negative for COVID,flu and strep in clinic today. Results discussed with patient during apt Due to nature and duration of symptoms recommended  treatment regimen is symptomatic relief and follow up if needed Discussed with patient the various viral and bacterial etiologies of current illness and appropriate course of treatment Discussed OTC medication options for multisymptom relief such as Dayquil/Nyquil, Theraflu, AlkaSeltzer, etc. ED and return precautions reviewed and provided in AVS.  Follow-up as needed for progressing or persistent symptoms    Discharge Instructions      Based on your described symptoms and the duration of symptoms it is likely that you have a viral upper respiratory infection (often called a cold)  Symptoms can last for 3-10 days with lingering cough and intermittent symptoms potentially  lasting several  weeks after that.  The goal of treatment at this time is to reduce your symptoms and discomfort   You can use the following medications and measures to help yourself feel better until your body fights this off: DayQuil/NyQuil, TheraFlu, Alka-Seltzer  (these medications typically have the same active ingredients in them so you can choose whichever one you prefer and take consistently during the day and night according to the manufactures instructions.) Flonase  A daily antihistamine such as Zyrtec, Claritin, Allegra per your preference.  Please choose 1 and take consistently. Increased fluids.  It is recommended that you take in at least 64 ounces of water  per day when you are not sick so it is important to increase this when you are sick and your body may be running fever. Rest Cough drops Chloraseptic throat spray to help with sore throat Nasal saline spray or nasal flushes to help with congestion and runny nose  If your symptoms seem like they are getting worse over the next 5 to 7 days or not improving you can always follow-up here in urgent care or go to your primary care provider for further management. Go to the ER if you begin to have more serious symptoms such as shortness of breath, trouble  breathing, loss of consciousness, swelling around the eyes, high fever, severe lasting headaches, vision changes or neck pain/stiffness.       ED Prescriptions   None    PDMP not reviewed this encounter.   Marylene Rocky BRAVO, PA-C 07/31/24 1138

## 2024-07-31 NOTE — Discharge Instructions (Signed)

## 2024-07-31 NOTE — ED Triage Notes (Signed)
 Pt presents with a chief complaint of nasal congestion x 2 weeks. This is accompanied with cough x 5 days and a sore throat x 3 days. Yesterday, pt was not able to smell or taste. Currently rates overall pain a 7/10. Voicing pain in face and throat. Has been taking OTC cold & flu with no improvement/relief. Does believes she has been running fevers at home.

## 2024-08-02 ENCOUNTER — Ambulatory Visit
Admission: RE | Admit: 2024-08-02 | Discharge: 2024-08-02 | Disposition: A | Discharge status: Home / Self Care | Source: Ambulatory Visit | Attending: Family Medicine | Admitting: Family Medicine

## 2024-08-02 VITALS — BP 107/69 | HR 104 | Temp 100.7°F | Resp 20

## 2024-08-02 DIAGNOSIS — J309 Allergic rhinitis, unspecified: Secondary | ICD-10-CM

## 2024-08-02 DIAGNOSIS — J189 Pneumonia, unspecified organism: Secondary | ICD-10-CM | POA: Diagnosis not present

## 2024-08-02 DIAGNOSIS — J0110 Acute frontal sinusitis, unspecified: Secondary | ICD-10-CM | POA: Diagnosis not present

## 2024-08-02 MED ORDER — AMOXICILLIN-POT CLAVULANATE 875-125 MG PO TABS
1.0000 | ORAL_TABLET | Freq: Two times a day (BID) | ORAL | 0 refills | Status: DC
Start: 2024-08-02 — End: 2024-08-20

## 2024-08-02 MED ORDER — PROMETHAZINE-DM 6.25-15 MG/5ML PO SYRP: 5.0000 mL | ORAL_SOLUTION | Freq: Every evening | ORAL | 0 refills | Status: DC | PRN

## 2024-08-02 MED ORDER — AZITHROMYCIN 250 MG PO TABS: ORAL_TABLET | ORAL | 0 refills | Status: DC

## 2024-08-02 MED ORDER — PREDNISONE 20 MG PO TABS: ORAL_TABLET | ORAL | 0 refills | Status: DC

## 2024-08-02 NOTE — ED Triage Notes (Signed)
 Pt c/o prod cough, nasal congestion, sore throat, fever, HA-sx started 2 weeks ago-using OTC meds/last dose ibuprofen  5am-states she was seen at another UC this week-tested neg for covid and flu-NAD-steady gait

## 2024-08-02 NOTE — ED Provider Notes (Signed)
 [name]  Note:  This document was prepared using Conservation officer, historic buildings and may include unintentional dictation errors.  MRN: [id number] DOB: [birth year]  Subjective:   Lisa Townsend is a 48 y.o. female presenting for recheck on upper respiratory symptoms.  Patient was seen 07/31/2024 and advised supportive care.  She has had 2-week history of persistent and worsening productive cough with chest pain, sinus congestion and drainage, severe throat pain, hoarseness, fever, severe headaches.  No history of asthma.  Has perennial allergic rhinitis and uses Zyrtec daily.  No history of asthma.  No smoking of any kind including cigarettes, cigars, vaping, marijuana use.    No current facility-administered medications for this encounter.  Current Outpatient Medications:    cetirizine (ZYRTEC) 10 MG tablet, Take 10 mg by mouth daily. (Patient not taking: Reported on 04/30/2024), Disp: , Rfl:    diazepam  (VALIUM ) 10 MG tablet, TAKE 1/2 TO 1 TABLET(5 TO 10 MG) BY MOUTH AT BEDTIME AS NEEDED FOR SLEEP, Disp: 12 tablet, Rfl: 2   eletriptan (RELPAX) 20 MG tablet, Take by mouth., Disp: , Rfl:    EPINEPHrine  0.3 mg/0.3 mL IJ SOAJ injection, Inject 0.3 mg into the muscle once as needed for up to 1 dose for anaphylaxis., Disp: 0.3 mL, Rfl: 2   estradiol  (ESTRACE ) 0.1 MG/GM vaginal cream, APPLY 1/2 GRAM TO VULVA NIGHTLY FOR 2 WEEKS THEN DECREASE TO 1/2 GRAM TO VULVA TWO NIGHTS A WEEK, Disp: 42.5 g, Rfl: 2   estradiol  (VIVELLE -DOT) 0.0375 MG/24HR, APPLY 1 PATCH TOPICALLY TO THE SKIN 2 TIMES A WEEK, Disp: 8 patch, Rfl: 0   levothyroxine  (SYNTHROID ) 75 MCG tablet, TAKE 1 TABLET BY MOUTH EVERY DAY, Disp: 15 tablet, Rfl: 0   liothyronine (CYTOMEL) 5 MCG tablet, Take 5 mcg by mouth 2 (two) times daily., Disp: , Rfl:    lubiprostone  (AMITIZA ) 24 MCG capsule, Take 24 mcg by mouth., Disp: , Rfl:    montelukast  (SINGULAIR ) 10 MG tablet, TAKE 1 TABLET(10 MG) BY MOUTH AT BEDTIME.,  Disp: 90 tablet, Rfl: 1   NONFORMULARY OR COMPOUNDED ITEM, Compounded Testosterone  PLO GEL 4% Apply pea sized amount to inner thigh qhs, Disp: 30 each, Rfl: 5   progesterone  (PROMETRIUM ) 100 MG capsule, TAKE 1 CAPSULE(100 MG) BY MOUTH DAILY, Disp: 90 capsule, Rfl: 0   sertraline  (ZOLOFT ) 50 MG tablet, Take 50 mg by mouth daily., Disp: , Rfl:    topiramate (TOPAMAX) 25 MG tablet, Take by mouth., Disp: , Rfl:    Allergies  Allergen Reactions   Claritin [Loratadine]     shakiness   Latex Itching   Lexapro  [Escitalopram ] Nausea Only    Past Medical History:  Diagnosis Date   Allergy    seasonal- allergy shots weekly to [facility]   Anxiety    Chronic constipation    daily to weekly constipation-  she states has a BM 2-3 x a week   Hypothyroidism    Migraine    Urticaria    Vitamin D  deficiency      Past Surgical History:  Procedure Laterality Date   BREAST BIOPSY Left 02/03/2014   CESAREAN SECTION N/A 08/04/2021   Procedure: CESAREAN SECTION;  Surgeon: Sudie Lavonia HERO, MD;  Location: [facility] LD ORS;  Service: Obstetrics;  Laterality: N/A;   NOSE SURGERY      Family History  Problem Relation Age of Onset   Cancer Mother    Hypertension Mother    Breast cancer Mother 82   Urolithiasis Father  Allergic rhinitis Brother    Alcohol abuse Maternal Uncle    Prostate cancer Paternal Uncle        mid 70s   Breast cancer Maternal Grandmother        dx under 50   Stomach cancer Paternal Grandmother        dx in her 73s   Asthma Neg Hx    Eczema Neg Hx    Urticaria Neg Hx    Colon cancer Neg Hx    Colon polyps Neg Hx    Esophageal cancer Neg Hx    Rectal cancer Neg Hx     Social History   Tobacco Use   Smoking status: Never    Passive exposure: Past   Smokeless tobacco: Never  Vaping Use   Vaping status: Never Used  Substance Use Topics   Alcohol use: Yes    Comment: rare   Drug use: No    ROS   Objective:   Vitals: BP 107/69 (BP Location: Right Arm)   Pulse  (!) 104   Temp (!) 100.7 F (38.2 C) (Oral)   Resp 20   SpO2 98%   Physical Exam Constitutional:      General: She is not in acute distress.    Appearance: Normal appearance. She is well-developed and normal weight. She is not ill-appearing, toxic-appearing or diaphoretic.  HENT:     Head: Normocephalic and atraumatic.     Right Ear: Tympanic membrane, ear canal and external ear normal. No drainage or tenderness. No middle ear effusion. There is no impacted cerumen. Tympanic membrane is not erythematous or bulging.     Left Ear: Tympanic membrane, ear canal and external ear normal. No drainage or tenderness.  No middle ear effusion. There is no impacted cerumen. Tympanic membrane is not erythematous or bulging.     Nose: Congestion present. No rhinorrhea.     Mouth/Throat:     Mouth: Mucous membranes are moist. No oral lesions.     Pharynx: Posterior oropharyngeal erythema (with associated postnasal drainage overlying pharynx) present. No pharyngeal swelling, oropharyngeal exudate or uvula swelling.     Tonsils: No tonsillar exudate or tonsillar abscesses.  Eyes:     General: Lids are normal. No scleral icterus.       Right eye: No discharge.        Left eye: No discharge.     Extraocular Movements: Extraocular movements intact.     Right eye: Normal extraocular motion.     Left eye: Normal extraocular motion.     Conjunctiva/sclera:     Right eye: Right conjunctiva is injected. No chemosis, exudate or hemorrhage.    Left eye: Left conjunctiva is injected. No chemosis, exudate or hemorrhage.    Pupils: Pupils are equal, round, and reactive to light.  Cardiovascular:     Rate and Rhythm: Normal rate and regular rhythm.     Heart sounds: Normal heart sounds. No murmur heard.    No friction rub. No gallop.  Pulmonary:     Effort: Pulmonary effort is normal. No respiratory distress.     Breath sounds: No stridor. Examination of the right-upper field reveals rales. Examination of the  right-middle field reveals rales. Rales present. No wheezing or rhonchi.  Chest:     Chest wall: No tenderness.  Musculoskeletal:     Cervical back: Normal range of motion and neck supple.  Lymphadenopathy:     Cervical: No cervical adenopathy.  Skin:    General: Skin is warm  and dry.  Neurological:     General: No focal deficit present.     Mental Status: She is alert and oriented to person, place, and time.     Cranial Nerves: No cranial nerve deficit.     Motor: No weakness.     Coordination: Coordination normal.     Gait: Gait normal.  Psychiatric:        Mood and Affect: Mood normal.        Behavior: Behavior normal.     Results for orders placed or performed during the hospital encounter of 07/31/24 (from the past 72 hours)  POC Covid19/Flu A&B Antigen     Status: None   Collection Time: 07/31/24 11:24 AM  Result Value Ref Range   Influenza A Antigen, POC Negative Negative   Influenza B Antigen, POC Negative Negative   Covid Antigen, POC Negative Negative  POCT rapid strep A     Status: None   Collection Time: 07/31/24 11:24 AM  Result Value Ref Range   Rapid Strep A Screen Negative Negative     Assessment and Plan :   PDMP not reviewed this encounter.  1. Pneumonia of right middle lobe due to infectious organism   2. Acute frontal sinusitis, recurrence not specified   3. Allergic rhinitis, unspecified seasonality, unspecified trigger    Deferred imaging and will treat empirically for pneumonia of the right middle lobe.  She has concurrent frontal sinusitis and symptoms likely worsened chronic allergic rhinitis.  Recommended double coverage with Augmentin , azithromycin .  Maintain Zyrtec.  Will add prednisone .  Use supportive care otherwise.  Counseled patient on potential for adverse effects with medications prescribed/recommended today, ER and return-to-clinic precautions discussed, patient verbalized understanding.    Christopher Savannah, PA-C 08/02/24 1000

## 2024-08-08 ENCOUNTER — Ambulatory Visit: Admitting: Radiology

## 2024-08-14 ENCOUNTER — Ambulatory Visit: Admitting: Radiology

## 2024-08-14 ENCOUNTER — Encounter: Payer: Self-pay | Admitting: Radiology

## 2024-08-14 VITALS — BP 110/70 | HR 81 | Ht 61.0 in | Wt 127.0 lb

## 2024-08-14 DIAGNOSIS — R7989 Other specified abnormal findings of blood chemistry: Secondary | ICD-10-CM | POA: Diagnosis not present

## 2024-08-14 DIAGNOSIS — R102 Pelvic and perineal pain unspecified side: Secondary | ICD-10-CM

## 2024-08-14 DIAGNOSIS — N951 Menopausal and female climacteric states: Secondary | ICD-10-CM | POA: Diagnosis not present

## 2024-08-14 LAB — URINALYSIS, COMPLETE W/RFL CULTURE
Bacteria, UA: NONE SEEN /HPF
Bilirubin Urine: NEGATIVE
Glucose, UA: NEGATIVE
Hgb urine dipstick: NEGATIVE
Hyaline Cast: NONE SEEN /LPF
Ketones, ur: NEGATIVE
Leukocyte Esterase: NEGATIVE
Nitrites, Initial: NEGATIVE
Protein, ur: NEGATIVE
RBC / HPF: NONE SEEN /HPF (ref 0–2)
Specific Gravity, Urine: 1.01 (ref 1.001–1.035)
WBC, UA: NONE SEEN /HPF (ref 0–5)
pH: 6 (ref 5.0–8.0)

## 2024-08-14 LAB — NO CULTURE INDICATED

## 2024-08-14 MED ORDER — ESTRADIOL-NORETHINDRONE ACET 0.05-0.14 MG/DAY TD PTTW: 1.0000 | MEDICATED_PATCH | TRANSDERMAL | 12 refills | Status: AC

## 2024-08-14 NOTE — Progress Notes (Signed)
 SUBJECTIVE: Valisha Heslin is a 48 y.o. female here for med check after starting HRT 4 months ago. Stopped progesterone  after 1 month because she was tired and lightheaded. Continued using estrogen and testosterone . Has not had any bleeding. Also c/o lower abdominal pain x 2 days, worried she has a UTI. Using duloclax for constipation.   Allergies  Allergen Reactions   Claritin [Loratadine]     shakiness   Latex Itching   Lexapro  [Escitalopram ] Nausea Only    Current Outpatient Medications on File Prior to Visit  Medication Sig Dispense Refill   diazepam  (VALIUM ) 10 MG tablet TAKE 1/2 TO 1 TABLET(5 TO 10 MG) BY MOUTH AT BEDTIME AS NEEDED FOR SLEEP 12 tablet 2   EPINEPHrine  0.3 mg/0.3 mL IJ SOAJ injection Inject 0.3 mg into the muscle once as needed for up to 1 dose for anaphylaxis. 0.3 mL 2   estradiol  (ESTRACE ) 0.1 MG/GM vaginal cream APPLY 1/2 GRAM TO VULVA NIGHTLY FOR 2 WEEKS THEN DECREASE TO 1/2 GRAM TO VULVA TWO NIGHTS A WEEK 42.5 g 2   levothyroxine  (SYNTHROID ) 75 MCG tablet TAKE 1 TABLET BY MOUTH EVERY DAY 15 tablet 0   liothyronine (CYTOMEL) 5 MCG tablet Take 5 mcg by mouth 2 (two) times daily.     NONFORMULARY OR COMPOUNDED ITEM Compounded Testosterone  PLO GEL 4% Apply pea sized amount to inner thigh qhs 30 each 5   sertraline  (ZOLOFT ) 50 MG tablet Take 50 mg by mouth daily.     amoxicillin -clavulanate (AUGMENTIN ) 875-125 MG tablet Take 1 tablet by mouth 2 (two) times daily. (Patient not taking: Reported on 08/14/2024) 20 tablet 0   azithromycin  (ZITHROMAX ) 250 MG tablet Day 1: take 2 tablets. Day 2-5: Take 1 tablet daily. (Patient not taking: Reported on 08/14/2024) 6 tablet 0   eletriptan (RELPAX) 20 MG tablet Take by mouth. (Patient not taking: Reported on 08/14/2024)     lubiprostone  (AMITIZA ) 24 MCG capsule Take 24 mcg by mouth. (Patient not taking: Reported on 08/14/2024)     montelukast  (SINGULAIR ) 10 MG tablet TAKE 1 TABLET(10 MG) BY MOUTH AT BEDTIME. (Patient not  taking: Reported on 08/14/2024) 90 tablet 1   predniSONE  (DELTASONE ) 20 MG tablet Take 2 tablets daily with breakfast. (Patient not taking: Reported on 08/14/2024) 10 tablet 0   promethazine -dextromethorphan (PROMETHAZINE -DM) 6.25-15 MG/5ML syrup Take 5 mLs by mouth at bedtime as needed for cough. (Patient not taking: Reported on 08/14/2024) 100 mL 0   topiramate (TOPAMAX) 25 MG tablet Take by mouth. (Patient not taking: Reported on 08/14/2024)     No current facility-administered medications on file prior to visit.    Past Medical History:  Diagnosis Date   Allergy    seasonal- allergy shots weekly to [facility]   Anxiety    Chronic constipation    daily to weekly constipation-  she states has a BM 2-3 x a week   Hypothyroidism    Migraine    Urticaria    Vitamin D  deficiency      OBJECTIVE: Appears well, in no apparent distress.  Vital signs are normal. The abdomen is soft without tenderness, guarding, mass, rebound or organomegaly. No CVA tenderness or inguinal adenopathy noted. Urine dipstick shows negative for all components.  Micro exam: negative for WBC's or RBC's.    Blood pressure 110/70, pulse 81, height 5' 1 (1.549 m), weight 127 lb (57.6 kg), SpO2 96%.    Chaperone offered and declined for exam.  ASSESSMENT/PLAN: 1. Low serum testosterone   level in female (Primary) Recheck levels - Testos,Total,Free and SHBG (Female)  2. Perimenopausal symptoms Will switch to combipatch to reduce progesterone  side effects. Discussed the importance of pairing the estrogen with a progesterone  to reduce the risks of hyperplasia. Does not want an IUD - estradiol -norethindrone (COMBIPATCH) 0.05-0.14 MG/DAY; Place 1 patch onto the skin 2 (two) times a week.  Dispense: 8 patch; Refill: 12  3. Pelvic pain - Urinalysis,Complete w/RFL Culture; negative   Avoid constipation. Push fluids.Call or return to clinic prn if these symptoms worsen or fail to improve as anticipated. Pyelo precautions  reviewed with patient.   Arminda Foglio B, NP 2:35 PM

## 2024-08-20 ENCOUNTER — Ambulatory Visit: Payer: Self-pay | Admitting: Radiology

## 2024-08-20 ENCOUNTER — Ambulatory Visit (HOSPITAL_COMMUNITY)
Admission: EM | Admit: 2024-08-20 | Discharge: 2024-08-20 | Disposition: A | Discharge status: Home / Self Care | Attending: Emergency Medicine | Admitting: Emergency Medicine

## 2024-08-20 ENCOUNTER — Encounter (HOSPITAL_COMMUNITY): Payer: Self-pay

## 2024-08-20 DIAGNOSIS — R6889 Other general symptoms and signs: Secondary | ICD-10-CM | POA: Diagnosis present

## 2024-08-20 DIAGNOSIS — J029 Acute pharyngitis, unspecified: Secondary | ICD-10-CM | POA: Diagnosis present

## 2024-08-20 LAB — POC COVID19/FLU A&B COMBO
Covid Antigen, POC: NEGATIVE
Influenza A Antigen, POC: NEGATIVE
Influenza B Antigen, POC: NEGATIVE

## 2024-08-20 LAB — TESTOS,TOTAL,FREE AND SHBG (FEMALE)
Free Testosterone: 10.8 pg/mL — ABNORMAL HIGH (ref 0.1–6.4)
Sex Hormone Binding: 51 nmol/L (ref 17–124)
Testosterone, Total, LC-MS-MS: 84 ng/dL — ABNORMAL HIGH (ref 2–45)

## 2024-08-20 LAB — POCT RAPID STREP A (OFFICE): Rapid Strep A Screen: NEGATIVE

## 2024-08-20 MED ORDER — DOXYCYCLINE HYCLATE 100 MG PO CAPS: 100.0000 mg | ORAL_CAPSULE | Freq: Two times a day (BID) | ORAL | 0 refills | Status: AC

## 2024-08-20 NOTE — ED Triage Notes (Signed)
 Pt c/o sore throat with redness and white spots since Saturday night. C/o body aches today. Took aleve with no relief.

## 2024-08-20 NOTE — ED Provider Notes (Signed)
 [facility]    CSN: [id number] Arrival date & time: 08/20/24  9056    HISTORY   Chief Complaint  Patient presents with   Sore Throat   HPI Lisa Townsend is a pleasant, 48 y.o. female who presents to urgent care today. Patient complains of a sore throat for the past 4 days.  Patient states her throat appears red and she has noticed white spots on her throat as well.  Patient complains of bodyaches that began today.  Patient states she has been taking Aleve without relief.  Patient has normal vital signs on arrival today.  Of note, rapid strep test today is negative.  Patient states that her 43-year-old son began daycare a few months ago and since then they have both been sick.  Also of note, patient was diagnosed and treated for right middle lobe pneumonia and frontal sinusitis on August 02, 2024.  Patient reports taking all antibiotics as prescribed.   Sore Throat   Past Medical History:  Diagnosis Date   Allergy    seasonal- allergy shots weekly to [facility]   Anxiety    Chronic constipation    daily to weekly constipation-  she states has a BM 2-3 x a week   Hypothyroidism    Migraine    Urticaria    Vitamin D  deficiency    Patient Active Problem List   Diagnosis Date Noted   Genital herpes simplex 11/04/2022   Immunotherapy 11/04/2022   History of infertility- ovarian insufficiency 12/29/2021   Seasonal and perennial allergic rhinoconjunctivitis 07/14/2020   Primary insomnia 08/02/2017   Constipation 10/13/2016   Sciatica of right side 08/02/2016   H/O vitamin D  deficiency 06/08/2016   Generalized anxiety disorder 05/21/2015   Chronic migraine without aura without status migrainosus, not intractable 05/05/2015   Hypothyroidism 01/06/2014   Past Surgical History:  Procedure Laterality Date   BREAST BIOPSY Left 02/03/2014   CESAREAN SECTION N/A 08/04/2021   Procedure: CESAREAN SECTION;  Surgeon: Sudie Lavonia HERO, MD;  Location: [facility] LD ORS;   Service: Obstetrics;  Laterality: N/A;   NOSE SURGERY     OB History     Gravida  1   Para  1   Term  1   Preterm  0   AB  0   Living  1      SAB  0   IAB  0   Ectopic  0   Multiple  0   Live Births  1          Home Medications    Prior to Admission medications   Medication Sig Start Date End Date Taking? Authorizing Provider  diazepam  (VALIUM ) 10 MG tablet TAKE 1/2 TO 1 TABLET(5 TO 10 MG) BY MOUTH AT BEDTIME AS NEEDED FOR SLEEP 02/06/23   Thedora Garnette HERO, MD  eletriptan (RELPAX) 20 MG tablet Take by mouth. Patient not taking: Reported on 08/14/2024    [provider]  EPINEPHrine  0.3 mg/0.3 mL IJ SOAJ injection Inject 0.3 mg into the muscle once as needed for up to 1 dose for anaphylaxis. 06/22/23   Iva Marty Saltness, MD  estradiol  (ESTRACE ) 0.1 MG/GM vaginal cream APPLY 1/2 GRAM TO VULVA NIGHTLY FOR 2 WEEKS THEN DECREASE TO 1/2 GRAM TO VULVA TWO NIGHTS A WEEK 06/12/24   Chrzanowski, Jami B, NP  estradiol -norethindrone (COMBIPATCH) 0.05-0.14 MG/DAY Place 1 patch onto the skin 2 (two) times a week. 08/15/24   Chrzanowski, Jami B, NP  levothyroxine  (SYNTHROID ) 75 MCG  tablet TAKE 1 TABLET BY MOUTH EVERY DAY 01/15/24   Thedora Garnette HERO, MD  liothyronine (CYTOMEL) 5 MCG tablet Take 5 mcg by mouth 2 (two) times daily.    [provider]  NONFORMULARY OR COMPOUNDED ITEM Compounded Testosterone  PLO GEL 4% Apply pea sized amount to inner thigh qhs 04/30/24   Chrzanowski, Jami B, NP  sertraline  (ZOLOFT ) 50 MG tablet Take 50 mg by mouth daily.    [provider]    Family History Family History  Problem Relation Age of Onset   Cancer Mother    Hypertension Mother    Breast cancer Mother 55   Urolithiasis Father    Allergic rhinitis Brother    Alcohol abuse Maternal Uncle    Prostate cancer Paternal Uncle        mid 29s   Breast cancer Maternal Grandmother        dx under 50   Stomach cancer Paternal Grandmother        dx in her 66s   Asthma  Neg Hx    Eczema Neg Hx    Urticaria Neg Hx    Colon cancer Neg Hx    Colon polyps Neg Hx    Esophageal cancer Neg Hx    Rectal cancer Neg Hx    Social History Social History   Tobacco Use   Smoking status: Never    Passive exposure: Past   Smokeless tobacco: Never  Vaping Use   Vaping status: Never Used  Substance Use Topics   Alcohol use: Yes    Comment: rare   Drug use: No   Allergies   Claritin [loratadine], Latex, and Lexapro  [escitalopram ]  Review of Systems Review of Systems Pertinent findings revealed after performing a 14 point review of systems has been noted in the history of present illness.  Physical Exam Vital Signs BP 105/67 (BP Location: Left Arm)   Pulse 70   Temp 98.7 F (37.1 C) (Oral)   Resp 18   SpO2 99%   No data found.  Physical Exam Vitals and nursing note reviewed.  Constitutional:      General: She is awake. She is not in acute distress.    Appearance: Normal appearance. She is well-developed and well-groomed. She is ill-appearing.  HENT:     Head: Normocephalic and atraumatic.     Salivary Glands: Right salivary gland is diffusely enlarged and tender. Left salivary gland is diffusely enlarged and tender.     Right Ear: Hearing, tympanic membrane and external ear normal.     Left Ear: Hearing, tympanic membrane and external ear normal.     Ears:     Comments: Bilateral EACs with erythema    Nose: Nose normal.     Right Turbinates: Not enlarged, swollen or pale.     Left Turbinates: Not enlarged, swollen or pale.     Right Sinus: No maxillary sinus tenderness or frontal sinus tenderness.     Left Sinus: No maxillary sinus tenderness or frontal sinus tenderness.     Mouth/Throat:     Lips: Pink. No lesions.     Mouth: Mucous membranes are moist. No oral lesions.     Tongue: No lesions. Tongue does not deviate from midline.     Palate: No mass and lesions.     Pharynx: Uvula midline. Pharyngeal swelling, oropharyngeal exudate,  posterior oropharyngeal erythema, uvula swelling and postnasal drip present.     Tonsils: No tonsillar exudate. 1+ on the right. 1+ on the left.  Eyes:     General: Lids are normal.        Right eye: No discharge.        Left eye: No discharge.     Conjunctiva/sclera: Conjunctivae normal.     Right eye: Right conjunctiva is not injected.     Left eye: Left conjunctiva is not injected.  Neck:     Trachea: Trachea and phonation normal.  Cardiovascular:     Rate and Rhythm: Normal rate and regular rhythm.  Pulmonary:     Effort: Pulmonary effort is normal.     Breath sounds: Normal breath sounds.  Chest:     Chest wall: No tenderness.  Musculoskeletal:        General: Normal range of motion.     Cervical back: Full passive range of motion without pain, normal range of motion and neck supple. Normal range of motion.  Lymphadenopathy:     Cervical: Cervical adenopathy present.     Right cervical: Superficial cervical adenopathy present.     Left cervical: Superficial cervical adenopathy present.  Skin:    General: Skin is warm and dry.     Findings: No erythema or rash.  Neurological:     General: No focal deficit present.     Mental Status: She is alert and oriented to person, place, and time. Mental status is at baseline.  Psychiatric:        Attention and Perception: Attention and perception normal.        Mood and Affect: Mood and affect normal.        Speech: Speech normal.        Behavior: Behavior normal. Behavior is cooperative.        Thought Content: Thought content normal.     Visual Acuity Right Eye Distance:   Left Eye Distance:   Bilateral Distance:    Right Eye Near:   Left Eye Near:    Bilateral Near:     UC Couse / Diagnostics / Procedures:     Radiology No results found.  Procedures Procedures (including critical care time) EKG  Pending results:  Labs Reviewed  CULTURE, GROUP A STREP [facility])  POCT RAPID STREP A (OFFICE)  POC COVID19/FLU A&B  COMBO    Medications Ordered in UC: Medications - No data to display  UC Diagnoses / Final Clinical Impressions(s)   I have reviewed the triage vital signs and the nursing notes.  Pertinent labs & imaging results that were available during my care of the patient were reviewed by me and considered in my medical decision making (see chart for details).    Final diagnoses:  Feeling unwell  Acute pharyngitis, unspecified etiology   COVID-19, influenza and rapid strep test are negative today.  Will perform strep throat culture to be complete.  Patient encouraged to repeat COVID-19 test in 3 days if feeling no better.  Based on physical exam findings and recent exposure to antibiotics, recommend patient begin 10-day course of doxycycline  for presumed bacterial pharyngitis.  Conservative care recommended.  Return precautions advised.  Please see discharge instructions below for details of plan of care as provided to patient. ED Prescriptions     Medication Sig Dispense Auth. Provider   doxycycline  (VIBRAMYCIN ) 100 MG capsule Take 1 capsule (100 mg total) by mouth 2 (two) times daily for 10 days. 20 capsule Joesph Shaver Scales, PA-C      PDMP not reviewed this encounter.  Pending results:  Labs Reviewed  CULTURE, GROUP A STREP (  [facility])  POCT RAPID STREP A (OFFICE)  POC COVID19/FLU A&B COMBO      Discharge Instructions      Your strep test today is negative.  Streptococcal throat culture will be performed per our protocol.  The result of your throat culture will be posted to your MyChart once it is complete, this typically takes 3 to 5 days.  If your streptococcal throat culture is positive, you will be contacted by phone and antibiotics will prescribed for you.   Your rapid influenza antigen test today was negative.  No further influenza testing is indicated.     Your COVID-19 test is negative.  Please consider retesting in the next 2 to 3 days, particularly if you are not  feeling any better.  You are welcome to return here to urgent care to have it done or you can take a home COVID-19 test.    If both your COVID-19 tests are negative, then you can safely assume that your illness is due to one of the many less serious illnesses circulating in our community right now.       Conservative care is recommended with rest, drinking plenty of clear fluids, eating only when hungry, taking supportive medications for your symptoms and avoiding being around other people.  Please remain at home until you are fever free for 24 hours without the use of antifever medications such as Tylenol  and ibuprofen .   Please read below to learn more about the medications, dosages and frequencies that I recommend to help alleviate your symptoms and to get you feeling better soon:   Adoxa, Vibramycin  (doxycycline ): Please take one (1) dose twice daily for 10 days.  This antibiotic can make you more sensitive to sunlight and may cause you to burn more easily.  Please avoid direct exposure while taking.  Please also avoid taking this medication with foods that contain calcium such as dairy products (milk, yogurt, cheese, ice cream).  Calcium binds with doxycycline  and prevents your body from absorbing it.  Please separate dairy products contain calcium and taking doxycycline  by 2 hours.   Advil , Motrin  (ibuprofen ): This is a good anti-inflammatory medication which addresses aches, pains and inflammation of the upper airways that causes sinus and nasal congestion as well as in the lower airways which makes your cough feel tight and sometimes burn.  I recommend that you take  400 mg every 6-8 hours as needed.  Please do not take more than 2400 mg of ibuprofen  in a 24-hour period and please do not take high doses of ibuprofen  for more than 3 days in a row as this can lead to stomach ulcers.   If symptoms have not meaningfully improved in the next 5 to 7 days, please return for repeat evaluation or  follow-up with your regular provider.  If symptoms have worsened in the next 3 to 5 days, please go to the emergency room for further evaluation.    Thank you for visiting urgent care today.  We appreciate the opportunity to participate in your care.       Disposition Upon Discharge:  Condition: stable for discharge home  Patient presented with an acute illness with associated systemic symptoms and significant discomfort requiring urgent management. In my opinion, this is a condition that a prudent lay person (someone who possesses an average knowledge of health and medicine) may potentially expect to result in complications if not addressed urgently such as respiratory distress, impairment of bodily function or dysfunction of bodily organs.  Routine symptom specific, illness specific and/or disease specific instructions were discussed with the patient and/or caregiver at length.   As such, the patient has been evaluated and assessed, work-up was performed and treatment was provided in alignment with urgent care protocols and evidence based medicine.  Patient/parent/caregiver has been advised that the patient may require follow up for further testing and treatment if the symptoms continue in spite of treatment, as clinically indicated and appropriate.  Patient/parent/caregiver has been advised to return to the [facility] or PCP if no better; to PCP or the Emergency Department if new signs and symptoms develop, or if the current signs or symptoms continue to change or worsen for further workup, evaluation and treatment as clinically indicated and appropriate  The patient will follow up with their current PCP if and as advised. If the patient does not currently have a PCP we will assist them in obtaining one.   The patient may need specialty follow up if the symptoms continue, in spite of conservative treatment and management, for further workup, evaluation, consultation and treatment as clinically  indicated and appropriate.  Patient/parent/caregiver verbalized understanding and agreement of plan as discussed.  All questions were addressed during visit.  Please see discharge instructions below for further details of plan.  This office note has been dictated using Teaching laboratory technician.  Unfortunately, this method of dictation can sometimes lead to typographical or grammatical errors.  I apologize for your inconvenience in advance if this occurs.  Please do not hesitate to reach out to me if clarification is needed.      Joesph Shaver Scales, PA-C 08/20/24 1121

## 2024-08-20 NOTE — Discharge Instructions (Addendum)
 Your strep test today is negative.  Streptococcal throat culture will be performed per our protocol.  The result of your throat culture will be posted to your MyChart once it is complete, this typically takes 3 to 5 days.  If your streptococcal throat culture is positive, you will be contacted by phone and antibiotics will prescribed for you.   Your rapid influenza antigen test today was negative.  No further influenza testing is indicated.     Your COVID-19 test is negative.  Please consider retesting in the next 2 to 3 days, particularly if you are not feeling any better.  You are welcome to return here to urgent care to have it done or you can take a home COVID-19 test.    If both your COVID-19 tests are negative, then you can safely assume that your illness is due to one of the many less serious illnesses circulating in our community right now.       Conservative care is recommended with rest, drinking plenty of clear fluids, eating only when hungry, taking supportive medications for your symptoms and avoiding being around other people.  Please remain at home until you are fever free for 24 hours without the use of antifever medications such as Tylenol  and ibuprofen .   Please read below to learn more about the medications, dosages and frequencies that I recommend to help alleviate your symptoms and to get you feeling better soon:   Adoxa, Vibramycin  (doxycycline ): Please take one (1) dose twice daily for 10 days.  This antibiotic can make you more sensitive to sunlight and may cause you to burn more easily.  Please avoid direct exposure while taking.  Please also avoid taking this medication with foods that contain calcium such as dairy products (milk, yogurt, cheese, ice cream).  Calcium binds with doxycycline  and prevents your body from absorbing it.  Please separate dairy products contain calcium and taking doxycycline  by 2 hours.   Advil , Motrin  (ibuprofen ): This is a good anti-inflammatory  medication which addresses aches, pains and inflammation of the upper airways that causes sinus and nasal congestion as well as in the lower airways which makes your cough feel tight and sometimes burn.  I recommend that you take  400 mg every 6-8 hours as needed.  Please do not take more than 2400 mg of ibuprofen  in a 24-hour period and please do not take high doses of ibuprofen  for more than 3 days in a row as this can lead to stomach ulcers.   If symptoms have not meaningfully improved in the next 5 to 7 days, please return for repeat evaluation or follow-up with your regular provider.  If symptoms have worsened in the next 3 to 5 days, please go to the emergency room for further evaluation.    Thank you for visiting urgent care today.  We appreciate the opportunity to participate in your care.

## 2024-08-20 NOTE — Telephone Encounter (Signed)
 [facility] contacted @ [phone].  I spoke to the pharmacist and updated the compounded testosterone  gel directions to apply a pea sized amount to inner thigh every other day at bedtime.  Patient was notified as well via MyChart message.

## 2024-08-23 LAB — CULTURE, GROUP A STREP (THRC)

## 2024-08-26 ENCOUNTER — Ambulatory Visit: Payer: Self-pay | Admitting: Emergency Medicine

## 2024-08-26 NOTE — Progress Notes (Signed)
 Please advise patient that throat culture was negative and the antibiotics are not needed unless she began to feel better within 24 hours of taking doxycycline .  If she did begin to feel better after 24 hours of doxycycline , she should finish the full 10-day course.

## 2024-09-17 ENCOUNTER — Ambulatory Visit: Admitting: Radiology

## 2024-10-01 ENCOUNTER — Ambulatory Visit: Admitting: Allergy & Immunology

## 2024-10-06 ENCOUNTER — Emergency Department (HOSPITAL_BASED_OUTPATIENT_CLINIC_OR_DEPARTMENT_OTHER)
Admission: EM | Admit: 2024-10-06 | Discharge: 2024-10-06 | Disposition: A | Discharge status: Home / Self Care | Source: Ambulatory Visit | Attending: Emergency Medicine | Admitting: Emergency Medicine

## 2024-10-06 ENCOUNTER — Emergency Department (HOSPITAL_BASED_OUTPATIENT_CLINIC_OR_DEPARTMENT_OTHER)

## 2024-10-06 ENCOUNTER — Ambulatory Visit (INDEPENDENT_AMBULATORY_CARE_PROVIDER_SITE_OTHER)

## 2024-10-06 ENCOUNTER — Other Ambulatory Visit: Payer: Self-pay

## 2024-10-06 ENCOUNTER — Ambulatory Visit
Admission: EM | Admit: 2024-10-06 | Discharge: 2024-10-06 | Disposition: A | Discharge status: Home / Self Care | Attending: Family Medicine | Admitting: Family Medicine

## 2024-10-06 ENCOUNTER — Encounter (HOSPITAL_BASED_OUTPATIENT_CLINIC_OR_DEPARTMENT_OTHER): Payer: Self-pay

## 2024-10-06 ENCOUNTER — Ambulatory Visit: Payer: Self-pay | Admitting: Nurse Practitioner

## 2024-10-06 DIAGNOSIS — R1013 Epigastric pain: Secondary | ICD-10-CM | POA: Diagnosis present

## 2024-10-06 DIAGNOSIS — R109 Unspecified abdominal pain: Secondary | ICD-10-CM | POA: Diagnosis not present

## 2024-10-06 DIAGNOSIS — Z91040 Latex allergy status: Secondary | ICD-10-CM | POA: Insufficient documentation

## 2024-10-06 DIAGNOSIS — K529 Noninfective gastroenteritis and colitis, unspecified: Secondary | ICD-10-CM | POA: Insufficient documentation

## 2024-10-06 DIAGNOSIS — D72829 Elevated white blood cell count, unspecified: Secondary | ICD-10-CM | POA: Insufficient documentation

## 2024-10-06 LAB — CBC
HCT: 41.4 % (ref 36.0–46.0)
Hemoglobin: 13.9 g/dL (ref 12.0–15.0)
MCH: 31.7 pg (ref 26.0–34.0)
MCHC: 33.6 g/dL (ref 30.0–36.0)
MCV: 94.3 fL (ref 80.0–100.0)
Platelets: 339 K/uL (ref 150–400)
RBC: 4.39 MIL/uL (ref 3.87–5.11)
RDW: 13.1 % (ref 11.5–15.5)
WBC: 13.1 K/uL — ABNORMAL HIGH (ref 4.0–10.5)
nRBC: 0 % (ref 0.0–0.2)

## 2024-10-06 LAB — POCT URINE DIPSTICK
Bilirubin, UA: NEGATIVE
Blood, UA: NEGATIVE
Glucose, UA: NEGATIVE mg/dL
Ketones, POC UA: NEGATIVE mg/dL
Leukocytes, UA: NEGATIVE
Nitrite, UA: NEGATIVE
POC PROTEIN,UA: NEGATIVE
Spec Grav, UA: 1.02 (ref 1.010–1.025)
Urobilinogen, UA: 0.2 U/dL
pH, UA: 6.5 (ref 5.0–8.0)

## 2024-10-06 LAB — COMPREHENSIVE METABOLIC PANEL WITH GFR
ALT: 57 U/L — ABNORMAL HIGH (ref 0–44)
AST: 35 U/L (ref 15–41)
Albumin: 3.6 g/dL (ref 3.5–5.0)
Alkaline Phosphatase: 66 U/L (ref 38–126)
Anion gap: 11 (ref 5–15)
BUN: 6 mg/dL (ref 6–20)
CO2: 26 mmol/L (ref 22–32)
Calcium: 8.2 mg/dL — ABNORMAL LOW (ref 8.9–10.3)
Chloride: 100 mmol/L (ref 98–111)
Creatinine, Ser: 0.72 mg/dL (ref 0.44–1.00)
GFR, Estimated: >60 mL/min (ref >60)
Glucose, Bld: 111 mg/dL — ABNORMAL HIGH (ref 70–99)
Potassium: 3.6 mmol/L (ref 3.5–5.1)
Sodium: 137 mmol/L (ref 135–145)
Total Bilirubin: 0.6 mg/dL (ref 0.0–1.2)
Total Protein: 5.7 g/dL — ABNORMAL LOW (ref 6.5–8.1)

## 2024-10-06 LAB — LIPASE, BLOOD: Lipase: 17 U/L (ref 11–51)

## 2024-10-06 LAB — URINALYSIS, ROUTINE W REFLEX MICROSCOPIC
Bilirubin Urine: NEGATIVE
Glucose, UA: NEGATIVE mg/dL
Hgb urine dipstick: NEGATIVE
Ketones, ur: 15 mg/dL — AB
Leukocytes,Ua: NEGATIVE
Nitrite: NEGATIVE
Protein, ur: NEGATIVE mg/dL
Specific Gravity, Urine: 1.02 (ref 1.005–1.030)
pH: 6 (ref 5.0–8.0)

## 2024-10-06 LAB — PREGNANCY, URINE: Preg Test, Ur: NEGATIVE

## 2024-10-06 LAB — POCT URINE PREGNANCY: Preg Test, Ur: NEGATIVE

## 2024-10-06 MED ORDER — AMOXICILLIN-POT CLAVULANATE 875-125 MG PO TABS
1.0000 | ORAL_TABLET | Freq: Once | ORAL | Status: AC
  Administered 2024-10-06: 1 via ORAL
  Filled 2024-10-06: qty 1

## 2024-10-06 MED ORDER — ONDANSETRON HCL 4 MG/2ML IJ SOLN
4.0000 mg | Freq: Once | INTRAMUSCULAR | Status: AC
  Administered 2024-10-06: 4 mg via INTRAVENOUS
  Filled 2024-10-06: qty 2

## 2024-10-06 MED ORDER — SODIUM CHLORIDE 0.9 % IV BOLUS
500.0000 mL | Freq: Once | INTRAVENOUS | Status: AC
  Administered 2024-10-06: 500 mL via INTRAVENOUS

## 2024-10-06 MED ORDER — MORPHINE SULFATE (PF) 4 MG/ML IV SOLN
4.0000 mg | Freq: Once | INTRAVENOUS | Status: AC
  Administered 2024-10-06: 4 mg via INTRAVENOUS
  Filled 2024-10-06: qty 1

## 2024-10-06 MED ORDER — IOHEXOL 300 MG/ML  SOLN
80.0000 mL | Freq: Once | INTRAMUSCULAR | Status: AC | PRN
  Administered 2024-10-06: 80 mL via INTRAVENOUS

## 2024-10-06 MED ORDER — AMOXICILLIN-POT CLAVULANATE 875-125 MG PO TABS: 1.0000 | ORAL_TABLET | Freq: Two times a day (BID) | ORAL | 0 refills | Status: DC

## 2024-10-06 MED ORDER — ONDANSETRON 4 MG PO TBDP: 4.0000 mg | ORAL_TABLET | Freq: Three times a day (TID) | ORAL | 0 refills | Status: DC | PRN

## 2024-10-06 NOTE — Discharge Instructions (Signed)
My medical recommendation is that you go to the emergency room for further evaluation of your abdominal pain

## 2024-10-06 NOTE — ED Provider Notes (Signed)
 [place] [facility] Provider Note   CSN: [id number] Arrival date & time: 10/06/24  1753     Patient presents with: Abdominal Pain   Lisa Townsend is a 48 y.o. female.   Patient to ED for evaluation of abdominal pain that started one week ago in the epigastrium that she felt was heartburn. Not relieved with antacids. She has had nausea without vomiting. Over the last 2-3 days, the pain started across the lower abdomen. Seen at Urgent Care today and sent to the ED for further evaluation. No fever. She reports loose stool without any blood. No urinary symptoms. She reports pain that also goes across the flank and lower back. No aggravating or alleviating factors. No chest pain or SOB. No previous abdominal surgeries.   The history is provided by the patient. No language interpreter was used.  Abdominal Pain      Prior to Admission medications   Medication Sig Start Date End Date Taking? Authorizing Provider  amoxicillin -clavulanate (AUGMENTIN ) 875-125 MG tablet Take 1 tablet by mouth every 12 (twelve) hours. 10/06/24  Yes Jakory Matsuo, Margit, PA-C  ondansetron  (ZOFRAN -ODT) 4 MG disintegrating tablet Take 1 tablet (4 mg total) by mouth every 8 (eight) hours as needed for nausea or vomiting. 10/06/24  Yes Arnika Larzelere, Margit, PA-C  diazepam  (VALIUM ) 10 MG tablet TAKE 1/2 TO 1 TABLET(5 TO 10 MG) BY MOUTH AT BEDTIME AS NEEDED FOR SLEEP 02/06/23   Thedora Garnette HERO, MD  EPINEPHrine  0.3 mg/0.3 mL IJ SOAJ injection Inject 0.3 mg into the muscle once as needed for up to 1 dose for anaphylaxis. 06/22/23   Iva Marty Saltness, MD  estradiol  (ESTRACE ) 0.1 MG/GM vaginal cream APPLY 1/2 GRAM TO VULVA NIGHTLY FOR 2 WEEKS THEN DECREASE TO 1/2 GRAM TO VULVA TWO NIGHTS A WEEK 06/12/24   Chrzanowski, Jami B, NP  estradiol -norethindrone  (COMBIPATCH) 0.05-0.14 MG/DAY Place 1 patch onto the skin 2 (two) times a week. 08/15/24   Chrzanowski, Jami B, NP  levothyroxine  (SYNTHROID ) 75  MCG tablet TAKE 1 TABLET BY MOUTH EVERY DAY 01/15/24   Thedora Garnette HERO, MD  liothyronine (CYTOMEL) 5 MCG tablet Take 5 mcg by mouth 2 (two) times daily.    [provider]  NONFORMULARY OR COMPOUNDED ITEM Compounded Testosterone  PLO GEL 4% Apply pea sized amount to inner thigh qhs 04/30/24   Chrzanowski, Jami B, NP  sertraline  (ZOLOFT ) 50 MG tablet Take 50 mg by mouth daily.    [provider]    Allergies: Claritin [loratadine], Latex, and Lexapro  [escitalopram ]    Review of Systems  Gastrointestinal:  Positive for abdominal pain.    Updated Vital Signs BP 125/66   Pulse 71   Temp 98.1 F (36.7 C)   Resp 16   Ht 5' 1 (1.549 m)   Wt 57.6 kg   LMP 12/19/2023   SpO2 100%   BMI 23.99 kg/m   Physical Exam Vitals and nursing note reviewed.  Constitutional:      Appearance: She is well-developed. She is not ill-appearing.     Comments: Uncomfortable appearing.  Cardiovascular:     Rate and Rhythm: Normal rate.  Pulmonary:     Effort: Pulmonary effort is normal.  Abdominal:     General: Bowel sounds are normal. There is no distension.     Palpations: Abdomen is soft.     Tenderness: There is abdominal tenderness. There is right CVA tenderness and left CVA tenderness.   Musculoskeletal:  General: Normal range of motion.  Skin:    General: Skin is warm and dry.  Neurological:     Mental Status: She is alert and oriented to person, place, and time.     (all labs ordered are listed, but only abnormal results are displayed) Labs Reviewed  COMPREHENSIVE METABOLIC PANEL WITH GFR - Abnormal; Notable for the following components:      Result Value   Glucose, Bld 111 (*)    Calcium 8.2 (*)    Total Protein 5.7 (*)    ALT 57 (*)    All other components within normal limits  CBC - Abnormal; Notable for the following components:   WBC 13.1 (*)    All other components within normal limits  URINALYSIS, ROUTINE W REFLEX MICROSCOPIC - Abnormal; Notable for  the following components:   Ketones, ur 15 (*)    All other components within normal limits  LIPASE, BLOOD  PREGNANCY, URINE    EKG: None  Radiology: CT ABDOMEN PELVIS W CONTRAST Result Date: 10/06/2024 EXAM: CT ABDOMEN AND PELVIS WITH CONTRAST 10/06/2024 09:00:13 PM TECHNIQUE: CT of the abdomen and pelvis was performed with the administration of intravenous contrast. 80 mL iohexol  (OMNIPAQUE ) 300 MG/ML solution was administered. Multiplanar reformatted images are provided for review. Automated exposure control, iterative reconstruction, and/or weight-based adjustment of the mA/kV was utilized to reduce the radiation dose to as low as reasonably achievable. COMPARISON: Comparison with 09/24/2012. CLINICAL HISTORY: Abdominal pain, acute, nonlocalized. FINDINGS: LOWER CHEST: No acute abnormality. LIVER: The liver is unremarkable. GALLBLADDER AND BILE DUCTS: Gallbladder is unremarkable. No biliary ductal dilatation. SPLEEN: No acute abnormality. PANCREAS: No acute abnormality. ADRENAL GLANDS: No acute abnormality. KIDNEYS, URETERS AND BLADDER: No stones in the kidneys or ureters. No hydronephrosis. No perinephric or periureteral stranding. Urinary bladder is unremarkable. GI AND BOWEL: Stomach demonstrates no acute abnormality. Diffuse wall thickening and mucosal hyperenhancement throughout the colon and rectum. Normal terminal ileum. Normal appendix. There is no bowel obstruction. PERITONEUM AND RETROPERITONEUM: No ascites. No free air. VASCULATURE: Aorta is normal in caliber. LYMPH NODES: No lymphadenopathy. REPRODUCTIVE ORGANS: No acute abnormality. BONES AND SOFT TISSUES: No acute osseous abnormality. No focal soft tissue abnormality. IMPRESSION: 1. Nonspecific infectious or inflammatory pancolitis. Electronically signed by: Norman Gatlin MD 10/06/2024 09:27 PM EST RP Workstation: [id number]   DG Abd 1 View Result Date: 10/06/2024 CLINICAL DATA:  Abdominal pain for 1 week.  History of  constipation. EXAM: ABDOMEN - 1 VIEW COMPARISON:  CT, 07/25/2012. FINDINGS: Normal bowel gas pattern. Mild colonic stool burden. Soft tissues are unremarkable. No significant skeletal abnormality. IMPRESSION: Negative. Electronically Signed   By: Alm Parkins M.D.   On: 10/06/2024 10:45     Procedures   Medications Ordered in the ED  sodium chloride  0.9 % bolus 500 mL ( Intravenous Stopped 10/06/24 1936)  ondansetron  (ZOFRAN ) injection 4 mg (4 mg Intravenous Given 10/06/24 1835)  morphine  (PF) 4 MG/ML injection 4 mg (4 mg Intravenous Given 10/06/24 1836)  iohexol  (OMNIPAQUE ) 300 MG/ML solution 80 mL (80 mLs Intravenous Contrast Given 10/06/24 2038)  morphine  (PF) 4 MG/ML injection 4 mg (4 mg Intravenous Given 10/06/24 2057)  amoxicillin -clavulanate (AUGMENTIN ) 875-125 MG per tablet 1 tablet (1 tablet Oral Given 10/06/24 2147)    Clinical Course as of 10/06/24 2153  Sun Oct 06, 2024  2148 Patient presents to ED from Urgent Care for further evaluation of abdominal pain as described in HPI. She is nontoxic in appearance on arrival, uncomfortable. VSS, afebrile.   IV  fluids, Zofran , morphine  provided for symptoms, which improve. Labs notable for slight leukocytosis of 13, normal hgb, normal electrolytes. UA negative.   CT obtained and shows pancolitis. These findings were discussed with the patient. No recent antibiotic use, doubt C diff. No fever. She is quite healthy otherwise. Will start Augmentin , discussed Zofran  for nausea, advancing diet as tolerated. Discussed GI follow up if symptoms do not improve over 3-4 days, and return precautions to the ED. Patient is comfortable with plan of discharge. She is tolerating PO fluids in the room. Felt appropriate to go home.  [SU]    Clinical Course User Index [SU] Odell Balls, PA-C                                 Medical Decision Making Amount and/or Complexity of Data Reviewed Labs: ordered. Radiology: ordered.  Risk Prescription drug  management.        Final diagnoses:  Colitis    ED Discharge Orders          Ordered    amoxicillin -clavulanate (AUGMENTIN ) 875-125 MG tablet  Every 12 hours        10/06/24 2145    ondansetron  (ZOFRAN -ODT) 4 MG disintegrating tablet  Every 8 hours PRN        10/06/24 2145               Odell Balls, PA-C 10/06/24 2153    Towana Ozell BROCKS, MD 10/07/24 [phone]

## 2024-10-06 NOTE — ED Triage Notes (Signed)
 Pt c/o pain in mid abdomen from upper abdomen to bottom of abdomen, bloating, diarrheax1wk. Pt c/o nausea today

## 2024-10-06 NOTE — ED Provider Notes (Signed)
 [facility]    CSN: [id number] Arrival date & time: 10/06/24  9085      History   Chief Complaint No chief complaint on file.   HPI Lisa Townsend is a 48 y.o. female presents for abd pain. Pt reports 1 week of a intermittent generalized abdominal pain that does not radiate.  She states it was feeling better but over the past day or 2 its worsened.  She endorses some nausea that started today but no vomiting.  Also states she has been having nonbloody diarrhea as well with bloating.  Denies any fevers, dysuria.  No history of GI diagnoses such as Crohn's, IBS, colitis or GERD.  She does have a history of chronic constipation.  Abdominal surgeries include C-section.  She states she is eating and drinking normally.  No change in diet but she does state a week ago she started a new over-the-counter supplement called NAD which is for energy.  She has been taking Gas-X OTC for symptoms.  No other concerns at this time.  HPI  Past Medical History:  Diagnosis Date   Allergy    seasonal- allergy shots weekly to [facility]   Anxiety    Chronic constipation    daily to weekly constipation-  she states has a BM 2-3 x a week   Hypothyroidism    Migraine    Urticaria    Vitamin D  deficiency     Patient Active Problem List   Diagnosis Date Noted   Genital herpes simplex 11/04/2022   Immunotherapy 11/04/2022   History of infertility- ovarian insufficiency 12/29/2021   Seasonal and perennial allergic rhinoconjunctivitis 07/14/2020   Primary insomnia 08/02/2017   Constipation 10/13/2016   Sciatica of right side 08/02/2016   H/O vitamin D  deficiency 06/08/2016   Generalized anxiety disorder 05/21/2015   Chronic migraine without aura without status migrainosus, not intractable 05/05/2015   Hypothyroidism 01/06/2014    Past Surgical History:  Procedure Laterality Date   BREAST BIOPSY Left 02/03/2014   CESAREAN SECTION N/A 08/04/2021   Procedure: CESAREAN SECTION;  Surgeon:  Sudie Lavonia HERO, MD;  Location: [facility] ORS;  Service: Obstetrics;  Laterality: N/A;   NOSE SURGERY      OB History     Gravida  1   Para  1   Term  1   Preterm  0   AB  0   Living  1      SAB  0   IAB  0   Ectopic  0   Multiple  0   Live Births  1            Home Medications    Prior to Admission medications   Medication Sig Start Date End Date Taking? Authorizing Provider  diazepam  (VALIUM ) 10 MG tablet TAKE 1/2 TO 1 TABLET(5 TO 10 MG) BY MOUTH AT BEDTIME AS NEEDED FOR SLEEP 02/06/23   Thedora Garnette HERO, MD  EPINEPHrine  0.3 mg/0.3 mL IJ SOAJ injection Inject 0.3 mg into the muscle once as needed for up to 1 dose for anaphylaxis. 06/22/23   Iva Marty Saltness, MD  estradiol  (ESTRACE ) 0.1 MG/GM vaginal cream APPLY 1/2 GRAM TO VULVA NIGHTLY FOR 2 WEEKS THEN DECREASE TO 1/2 GRAM TO VULVA TWO NIGHTS A WEEK 06/12/24   Chrzanowski, Jami B, NP  estradiol -norethindrone  (COMBIPATCH) 0.05-0.14 MG/DAY Place 1 patch onto the skin 2 (two) times a week. 08/15/24   Chrzanowski, Jami B, NP  levothyroxine  (SYNTHROID ) 75 MCG tablet TAKE 1  TABLET BY MOUTH EVERY DAY 01/15/24   Thedora Garnette HERO, MD  liothyronine (CYTOMEL) 5 MCG tablet Take 5 mcg by mouth 2 (two) times daily.    [provider]  NONFORMULARY OR COMPOUNDED ITEM Compounded Testosterone  PLO GEL 4% Apply pea sized amount to inner thigh qhs 04/30/24   Chrzanowski, Jami B, NP  sertraline  (ZOLOFT ) 50 MG tablet Take 50 mg by mouth daily.    [provider]    Family History Family History  Problem Relation Age of Onset   Cancer Mother    Hypertension Mother    Breast cancer Mother 51   Urolithiasis Father    Allergic rhinitis Brother    Alcohol abuse Maternal Uncle    Prostate cancer Paternal Uncle        mid 27s   Breast cancer Maternal Grandmother        dx under 50   Stomach cancer Paternal Grandmother        dx in her 71s   Asthma Neg Hx    Eczema Neg Hx    Urticaria Neg Hx    Colon cancer Neg Hx     Colon polyps Neg Hx    Esophageal cancer Neg Hx    Rectal cancer Neg Hx     Social History Social History   Tobacco Use   Smoking status: Never    Passive exposure: Past   Smokeless tobacco: Never  Vaping Use   Vaping status: Never Used  Substance Use Topics   Alcohol use: Yes    Comment: rare   Drug use: No     Allergies   Claritin [loratadine], Latex, and Lexapro  [escitalopram ]   Review of Systems Review of Systems  Gastrointestinal:  Positive for abdominal pain and nausea.     Physical Exam Triage Vital Signs ED Triage Vitals  Encounter Vitals Group     BP 10/06/24 0924 113/69     Girls Systolic BP Percentile --      Girls Diastolic BP Percentile --      Boys Systolic BP Percentile --      Boys Diastolic BP Percentile --      Pulse Rate 10/06/24 0924 65     Resp 10/06/24 0924 17     Temp 10/06/24 0924 98.9 F (37.2 C)     Temp Source 10/06/24 0924 Oral     SpO2 10/06/24 0924 98 %     Weight --      Height --      Head Circumference --      Peak Flow --      Pain Score 10/06/24 0921 9     Pain Loc --      Pain Education --      Exclude from Growth Chart --    No data found.  Updated Vital Signs BP 113/69   Pulse 65   Temp 98.9 F (37.2 C) (Oral)   Resp 17   LMP 12/19/2023   SpO2 98%   Visual Acuity Right Eye Distance:   Left Eye Distance:   Bilateral Distance:    Right Eye Near:   Left Eye Near:    Bilateral Near:     Physical Exam Vitals and nursing note reviewed.  Constitutional:      General: She is not in acute distress.    Appearance: Normal appearance. She is not ill-appearing.  HENT:     Head: Normocephalic and atraumatic.  Eyes:     Pupils: Pupils are equal,  round, and reactive to light.  Cardiovascular:     Rate and Rhythm: Normal rate.  Pulmonary:     Effort: Pulmonary effort is normal.  Abdominal:     General: Abdomen is flat. Bowel sounds are normal.     Palpations: Abdomen is soft. There is no hepatomegaly or  splenomegaly.     Tenderness: There is generalized abdominal tenderness. There is no guarding or rebound. Negative signs include Rovsing's sign and McBurney's sign.  Skin:    General: Skin is warm and dry.  Neurological:     General: No focal deficit present.     Mental Status: She is alert and oriented to person, place, and time.  Psychiatric:        Mood and Affect: Mood normal.        Behavior: Behavior normal.      UC Treatments / Results  Labs (all labs ordered are listed, but only abnormal results are displayed) Labs Reviewed  POCT URINE DIPSTICK  POCT URINE PREGNANCY    EKG   Radiology No results found.  Procedures Procedures (including critical care time)  Medications Ordered in UC Medications - No data to display  Initial Impression / Assessment and Plan / UC Course  I have reviewed the triage vital signs and the nursing notes.  Pertinent labs & imaging results that were available during my care of the patient were reviewed by me and considered in my medical decision making (see chart for details).     Reviewed exam and symptoms with patient.  UA and urine hCG negative.  Wet read of abdominal x-ray without any obvious constipation, will contact for any positive results with radiology overread once available.  Patient presenting with 1 week of abdominal pain that has worsened over the past 1 to 2 days.  Given this with no identifiable cause and given workup I am able to do in the setting I advise she go to the emergency room for further evaluation.  Patient declines due to cost concerns.  She states she is already sent a message to her PCP for GI referral.  Advise she can follow-up with her PCP as soon as possible for additional evaluation but that my medical recommendation is that she go to the emergency room.  Patient verbalized understanding Final Clinical Impressions(s) / UC Diagnoses   Final diagnoses:  Abdominal pain, unspecified abdominal location      Discharge Instructions      My medical recommendation is that you go to the emergency room for further evaluation of your abdominal pain     ED Prescriptions   None    PDMP not reviewed this encounter.   Loreda Myla SAUNDERS, NP 10/06/24 1018

## 2024-10-06 NOTE — Discharge Instructions (Signed)
 As we discussed, take Augmentin  twice daily. Zofran  for nausea as directed. If having more than 5 bowel movements daily, recommend Imodium.   Return to the ED if symptoms worsen - severe pain, high fever, bloody stools, frequent vomiting. Otherwise, you can treat this at home. If symptoms do not improve after 3-4 days, follow up with Dr. Burnette by calling the office to schedule a time to be seen.

## 2024-10-06 NOTE — ED Triage Notes (Signed)
 Upper abd pain radiating to leg x3 days. Sent from UC to r/o gallbladder.

## 2024-10-08 ENCOUNTER — Telehealth: Payer: Self-pay | Admitting: Gastroenterology

## 2024-10-08 NOTE — Telephone Encounter (Signed)
 Inbound call from patient stating she was seen in the ER for colitis and she is not doing well. She states she was not given anything for her pain and is request a sooner appointment than January. Patient is requesting a call to discuss symptoms and next steps. Please advise.

## 2024-10-09 MED ORDER — DICYCLOMINE HCL 20 MG PO TABS: 20.0000 mg | ORAL_TABLET | Freq: Three times a day (TID) | ORAL | 0 refills | Status: AC

## 2024-10-09 NOTE — Addendum Note (Signed)
 Addended by: Kari Montero N on: 10/09/2024 12:37 PM   Modules accepted: Orders

## 2024-10-09 NOTE — Telephone Encounter (Addendum)
 Patient has been scheduled for an appt tomorrow at 11 am with Dora, [place]. I spoke with Alan, [place] and she has recommended that we send in a prescription for Dicyclomine 20 mg up to QID PRN for abdominal pain and diarrhea. Alan, [place] will possibly test stool studies tomorrow while in clinic. Patient verbalized understanding and had no concerns at the end of the call.   Prescription for Dicyclomine sent to pharmacy on file. Patient confirmed pharmacy on file.

## 2024-10-09 NOTE — Progress Notes (Unsigned)
 10/10/2024 Lisa Townsend [id number] 23-Feb-1976  Referring provider: Maude Rocker, PA-C Primary GI doctor: Dr. San  ASSESSMENT AND PLAN:  Colitis seen on CT in the ER 11/30 had epigastric pain x weeks, assumed it was GERD, tried gas relief without help, diarrhea with episodes of incontinence, had BRB small volume on TP  Had ABX in Sept and Oct, (augmentin  and doxycycline )  10/06/2024 KUB with mild colonic stool burden 10/06/2024 CTAP W nonspecific infectious or inflammatory pancolitis normal terminal ileum no bowel obstruction stomach normal unremarkable liver, gallbladder WBC 13.1 no anemia normal platelets started on Augmentin , given dicyclomine that has helped Acute abdominal pain with nausea, loose stools, and incontinence. Differential includes infectious colitis and C. difficile infection. Recent colonoscopy normal, reducing likelihood of inflammatory bowel disease. C. difficile suspected due to recent antibiotic use and symptoms. - Ordered C. difficile stool test. - Continue Augmentin  unless C. difficile test is positive. - Provided information on C. difficile infection. - Advised on low FODMAP or BRAT diet. - Recommended heating pad for symptom relief. - Prescribed dicyclomine for pain management. - Ordered labs to rule out inflammatory bowel disease.  Anal fissure and hemorrhoid Posterior fissure and hemorrhoid likely due to frequent bowel movements. Symptoms include rectal discomfort and blood on wiping. - Prescribed compound cream for rectal discomfort and fissure. - Advised on rectal hygiene and care.  Constipaton Has had to take dulcolax 2-3 x a day prior to this  Can address after acute illness Consider pelvic floor PT  Elevated LFTs  CT abdomen pelvis 10/06/2024 unremarkable liver    Latest Ref Rng & Units 10/06/2024    6:32 PM 12/29/2021   10:35 AM 08/04/2021    7:35 AM  Hepatic Function  Total Protein 6.5 - 8.1 g/dL 5.7  7.4  5.4    Albumin 3.5 - 5.0 g/dL 3.6  4.9  2.7   AST 15 - 41 U/L 35  22  29   ALT 0 - 44 U/L 57  23  20   Alk Phosphatase 38 - 126 U/L 66  80  172   Total Bilirubin 0.0 - 1.2 mg/dL 0.6  1.4  0.9    Platelets 339   CCS 02/07/2022 colonoscopy 2 polyps 2 to 4 mm distal sigmoid transverse nonbleeding internal hemorrhoids normal TI good quality prep, benign colonic mucosa recall 10 years  Patient Care Team: Lisa Rocker, PA-C as PCP - General (Physician Assistant) Lisa Orlan HERO, DO as Consulting Physician (Allergy)  HISTORY OF PRESENT ILLNESS: 48 y.o. female with a past medical history listed below presents for evaluation of diarrhea, AB pain, colitis on CT.   Discussed the use of AI scribe software for clinical note transcription with the patient, who gave verbal consent to proceed.  History of Present Illness   Lisa Townsend is a 48 year old female who presents with abdominal pain and diarrhea.  She has been experiencing abdominal pain for almost two weeks, primarily located in the right side of her abdomen. Initially, she attributed the pain to spicy foods and attempted self-treatment with over-the-counter medications such as gas relief and omeprazole, with inconsistent relief. Last week, the pain worsened significantly, prompting her to visit urgent care on Sunday. An x-ray showed no abnormalities. At the ER, she received morphine  for pain management and underwent a scan, which revealed colitis. She was prescribed antibiotics, which were sent to the pharmacy, but did not receive any pain medication.  On Monday, she experienced severe pain, fatigue,  and was unable to function normally. On Tuesday, she contacted her doctor for an appointment. She reports significant pain and diarrhea, and describes soiling herself three times in one day. The diarrhea is described as 'gluey' and she sometimes does not feel the urge to defecate. She has been taking dicyclomine, which was prescribed to her, and  reports that it has helped.  She mentions a sensation of something unusual around her rectum, which she noticed while wiping and observed some blood. She describes the sensation as 'inserts or something in there around.'  Her past medical history includes a UTI treated with antibiotics a few months ago, and she has been on multiple antibiotics recently, including doxycycline  for a throat infection and possibly Augmentin  or Z-Pak for a UTI and sinus infection. She has been taking Dulcolax regularly for constipation, consuming three to four pills daily, but stopped once her current symptoms began.  No recent sick contacts, new medications, or UTIs in the past six months. She reports nausea but no vomiting, fevers, or chills. She confirms passing gas and stools, with pain primarily in the left lower abdomen and center. She occasionally experiences urinary leakage when laughing, sneezing, or coughing.      She  reports that she has never smoked. She has been exposed to tobacco smoke. She has never used smokeless tobacco. She reports current alcohol use. She reports that she does not use drugs.  RELEVANT GI HISTORY, IMAGING AND LABS: Results   RADIOLOGY Abdominal X-ray: Normal (10/06/2024) Abdominal CT: Colitis (10/06/2024)      CBC    Component Value Date/Time   WBC 13.1 (H) 10/06/2024 1832   RBC 4.39 10/06/2024 1832   HGB 13.9 10/06/2024 1832   HCT 41.4 10/06/2024 1832   PLT 339 10/06/2024 1832   MCV 94.3 10/06/2024 1832   MCV 97.5 (A) 08/05/2014 1423   MCH 31.7 10/06/2024 1832   MCHC 33.6 10/06/2024 1832   RDW 13.1 10/06/2024 1832   LYMPHSABS 1.3 11/30/2020 0919   MONOABS 0.3 11/30/2020 0919   EOSABS 0.1 11/30/2020 0919   BASOSABS 0.0 11/30/2020 0919   Recent Labs    10/06/24 1832  HGB 13.9    CMP     Component Value Date/Time   NA 137 10/06/2024 1832   K 3.6 10/06/2024 1832   CL 100 10/06/2024 1832   CO2 26 10/06/2024 1832   GLUCOSE 111 (H) 10/06/2024 1832   BUN 6  10/06/2024 1832   CREATININE 0.72 10/06/2024 1832   CREATININE 0.92 06/08/2016 0925   CALCIUM 8.2 (L) 10/06/2024 1832   PROT 5.7 (L) 10/06/2024 1832   ALBUMIN 3.6 10/06/2024 1832   AST 35 10/06/2024 1832   ALT 57 (H) 10/06/2024 1832   ALKPHOS 66 10/06/2024 1832   BILITOT 0.6 10/06/2024 1832   GFRNONAA >60 10/06/2024 1832   GFRAA >90 07/26/2012 0352      Latest Ref Rng & Units 10/06/2024    6:32 PM 12/29/2021   10:35 AM 08/04/2021    7:35 AM  Hepatic Function  Total Protein 6.5 - 8.1 g/dL 5.7  7.4  5.4   Albumin 3.5 - 5.0 g/dL 3.6  4.9  2.7   AST 15 - 41 U/L 35  22  29   ALT 0 - 44 U/L 57  23  20   Alk Phosphatase 38 - 126 U/L 66  80  172   Total Bilirubin 0.0 - 1.2 mg/dL 0.6  1.4  0.9  Current Medications:   Current Outpatient Medications (Endocrine & Metabolic):    estradiol -norethindrone  (COMBIPATCH) 0.05-0.14 MG/DAY, Place 1 patch onto the skin 2 (two) times a week.   levothyroxine  (SYNTHROID ) 50 MCG tablet, Take 50 mcg by mouth daily before breakfast.   liothyronine (CYTOMEL) 5 MCG tablet, Take 5 mcg by mouth 2 (two) times daily.  Current Outpatient Medications (Cardiovascular):    EPINEPHrine  0.3 mg/0.3 mL IJ SOAJ injection, Inject 0.3 mg into the muscle once as needed for up to 1 dose for anaphylaxis.     Current Outpatient Medications (Other):    AMBULATORY NON FORMULARY MEDICATION, Medication Name: Diltiazem 2%/Lidocaine  2% Using your index finger apply a small amount of medication inside the anal opening and to the external anal area twice daily x 6 weeks.   amoxicillin -clavulanate (AUGMENTIN ) 875-125 MG tablet, Take 1 tablet by mouth every 12 (twelve) hours.   diazepam  (VALIUM ) 10 MG tablet, TAKE 1/2 TO 1 TABLET(5 TO 10 MG) BY MOUTH AT BEDTIME AS NEEDED FOR SLEEP   dicyclomine (BENTYL) 20 MG tablet, Take 1 tablet (20 mg total) by mouth 4 (four) times daily -  before meals and at bedtime.   estradiol  (ESTRACE ) 0.1 MG/GM vaginal cream, APPLY 1/2 GRAM TO VULVA  NIGHTLY FOR 2 WEEKS THEN DECREASE TO 1/2 GRAM TO VULVA TWO NIGHTS A WEEK   NONFORMULARY OR COMPOUNDED ITEM, Compounded Testosterone  PLO GEL 4% Apply pea sized amount to inner thigh qhs   ondansetron  (ZOFRAN -ODT) 4 MG disintegrating tablet, Take 1 tablet (4 mg total) by mouth every 8 (eight) hours as needed for nausea or vomiting.   sertraline  (ZOLOFT ) 50 MG tablet, Take 50 mg by mouth daily.  Medical History:  Past Medical History:  Diagnosis Date   Allergy    seasonal- allergy shots weekly to [facility]   Anxiety    Chronic constipation    daily to weekly constipation-  she states has a BM 2-3 x a week   Hypothyroidism    Iron deficiency anemia    Migraine    Urticaria    Vitamin D  deficiency    Allergies:  Allergies  Allergen Reactions   Claritin [Loratadine]     shakiness   Latex Itching   Lexapro  [Escitalopram ] Nausea Only     Surgical History:  She  has a past surgical history that includes Nose surgery; Breast biopsy (Left, 02/03/2014); and Cesarean section (N/A, 08/04/2021). Family History:  Her family history includes Alcohol abuse in her maternal uncle; Allergic rhinitis in her brother; Breast cancer in her maternal grandmother; Breast cancer (age of onset: 95) in her mother; Heart disease in her father; Hypertension in her mother; Prostate cancer in her paternal uncle; Stomach cancer in her paternal grandmother; Urolithiasis in her father.  REVIEW OF SYSTEMS  : All other systems reviewed and negative except where noted in the History of Present Illness.  PHYSICAL EXAM: BP 90/60 (BP Location: Left Arm, Patient Position: Sitting, Cuff Size: Normal)   Pulse 68   Ht 4' 11.5 (1.511 m)   Wt 139 lb 4 oz (63.2 kg)   LMP 12/19/2023   BMI 27.65 kg/m  Physical Exam   GENERAL APPEARANCE: Well nourished, in no apparent distress. HEENT: No cervical lymphadenopathy, unremarkable thyroid , sclerae anicteric, conjunctiva pink. RESPIRATORY: Respiratory effort normal, BS equal  bilateral without rales, rhonchi, wheezing. CARDIO: RRR with no MRGs, peripheral pulses intact. ABDOMEN: Soft, non distended, active bowel sounds in all 4 quadrants, no tenderness to palpation, no rebound, no mass appreciated. RECTAL: Rectal  exam shows hemorrhoid and posterior fissure. Soft stool with mucus observed. MUSCULOSKELETAL: Full ROM, normal gait, without edema. SKIN: Dry, intact without rashes or lesions. No jaundice. NEURO: Alert, oriented, no focal deficits. PSYCH: Cooperative, normal mood and affect.      Alan JONELLE Coombs, PA-C 11:38 AM

## 2024-10-10 ENCOUNTER — Ambulatory Visit: Admitting: Physician Assistant

## 2024-10-10 ENCOUNTER — Other Ambulatory Visit

## 2024-10-10 ENCOUNTER — Encounter: Payer: Self-pay | Admitting: Physician Assistant

## 2024-10-10 ENCOUNTER — Ambulatory Visit: Admitting: Family Medicine

## 2024-10-10 VITALS — BP 90/60 | HR 68 | Ht 59.5 in | Wt 139.2 lb

## 2024-10-10 DIAGNOSIS — R197 Diarrhea, unspecified: Secondary | ICD-10-CM

## 2024-10-10 DIAGNOSIS — J309 Allergic rhinitis, unspecified: Secondary | ICD-10-CM

## 2024-10-10 LAB — CBC WITH DIFFERENTIAL/PLATELET
Basophils Absolute: 0 K/uL (ref 0.0–0.1)
Basophils Relative: 0.5 % (ref 0.0–3.0)
Eosinophils Absolute: 0.5 K/uL (ref 0.0–0.7)
Eosinophils Relative: 6.6 % — ABNORMAL HIGH (ref 0.0–5.0)
HCT: 36.3 % (ref 36.0–46.0)
Hemoglobin: 12.2 g/dL (ref 12.0–15.0)
Lymphocytes Relative: 20.8 % (ref 12.0–46.0)
Lymphs Abs: 1.5 K/uL (ref 0.7–4.0)
MCHC: 33.6 g/dL (ref 30.0–36.0)
MCV: 95.9 fl (ref 78.0–100.0)
Monocytes Absolute: 0.5 K/uL (ref 0.1–1.0)
Monocytes Relative: 7.1 % (ref 3.0–12.0)
Neutro Abs: 4.8 K/uL (ref 1.4–7.7)
Neutrophils Relative %: 65 % (ref 43.0–77.0)
Platelets: 305 K/uL (ref 150.0–400.0)
RBC: 3.79 Mil/uL — ABNORMAL LOW (ref 3.87–5.11)
RDW: 13.8 % (ref 11.5–15.5)
WBC: 7.5 K/uL (ref 4.0–10.5)

## 2024-10-10 LAB — COMPREHENSIVE METABOLIC PANEL WITH GFR
ALT: 49 U/L — ABNORMAL HIGH (ref 0–35)
AST: 34 U/L (ref 0–37)
Albumin: 3.4 g/dL — ABNORMAL LOW (ref 3.5–5.2)
Alkaline Phosphatase: 65 U/L (ref 39–117)
BUN: 6 mg/dL (ref 6–23)
CO2: 27 meq/L (ref 19–32)
Calcium: 8.5 mg/dL (ref 8.4–10.5)
Chloride: 106 meq/L (ref 96–112)
Creatinine, Ser: 0.64 mg/dL (ref 0.40–1.20)
GFR: 103.89 mL/min (ref >60.00)
Glucose, Bld: 81 mg/dL (ref 70–99)
Potassium: 4.9 meq/L (ref 3.5–5.1)
Sodium: 142 meq/L (ref 135–145)
Total Bilirubin: 0.3 mg/dL (ref 0.2–1.2)
Total Protein: 5.7 g/dL — ABNORMAL LOW (ref 6.0–8.3)

## 2024-10-10 LAB — C-REACTIVE PROTEIN: CRP: 2.1 mg/dL (ref 0.5–20.0)

## 2024-10-10 LAB — SEDIMENTATION RATE: Sed Rate: 4 mm/h (ref 0–20)

## 2024-10-10 MED ORDER — AMBULATORY NON FORMULARY MEDICATION: 1 refills | Status: AC

## 2024-10-10 NOTE — Patient Instructions (Incomplete)
 Allergic rhinitis Continue allergen immunotherapy directed toward grass pollen, ragweed pollen, tree pollen, cat, and mold as listed below Continue montelukast  10 mg once a day to control allergy symptoms  Shortness of breath  Call the clinic if this treatment plan is not working well for you  Follow up in *** or sooner if needed.  Reducing Pollen Exposure The American Academy of Allergy, Asthma and Immunology suggests the following steps to reduce your exposure to pollen during allergy seasons. Do not hang sheets or clothing out to dry; pollen may collect on these items. Do not mow lawns or spend time around freshly cut grass; mowing stirs up pollen. Keep windows closed at night.  Keep car windows closed while driving. Minimize morning activities outdoors, a time when pollen counts are usually at their highest. Stay indoors as much as possible when pollen counts or humidity is high and on windy days when pollen tends to remain in the air longer. Use air conditioning when possible.  Many air conditioners have filters that trap the pollen spores. Use a HEPA room air filter to remove pollen form the indoor air you breathe.  Control of Dog or Cat Allergen Avoidance is the best way to manage a dog or cat allergy. If you have a dog or cat and are allergic to dog or cats, consider removing the dog or cat from the home. If you have a dog or cat but don't want to find it a new home, or if your family wants a pet even though someone in the household is allergic, here are some strategies that may help keep symptoms at bay:  Keep the pet out of your bedroom and restrict it to only a few rooms. Be advised that keeping the dog or cat in only one room will not limit the allergens to that room. Don't pet, hug or kiss the dog or cat; if you do, wash your hands with soap and water . High-efficiency particulate air (HEPA) cleaners run continuously in a bedroom or living room can reduce allergen levels over  time. Regular use of a high-efficiency vacuum cleaner or a central vacuum can reduce allergen levels. Giving your dog or cat a bath at least once a week can reduce airborne allergen.  Control of Mold Allergen Mold and fungi can grow on a variety of surfaces provided certain temperature and moisture conditions exist.  Outdoor molds grow on plants, decaying vegetation and soil.  The major outdoor mold, Alternaria and Cladosporium, are found in very high numbers during hot and dry conditions.  Generally, a late Summer - Fall peak is seen for common outdoor fungal spores.  Rain will temporarily lower outdoor mold spore count, but counts rise rapidly when the rainy period ends.  The most important indoor molds are Aspergillus and Penicillium.  Dark, humid and poorly ventilated basements are ideal sites for mold growth.  The next most common sites of mold growth are the bathroom and the kitchen.  Outdoor [employer] Use air conditioning and keep windows closed Avoid exposure to decaying vegetation. Avoid leaf raking. Avoid grain handling. Consider wearing a face mask if working in moldy areas.  Indoor Mold Control Maintain humidity below 50%. Clean washable surfaces with 5% bleach solution. Remove sources e.g. Contaminated carpets.

## 2024-10-10 NOTE — Patient Instructions (Addendum)
 _______________________________________________________  If your blood pressure at your visit was 140/90 or greater, please contact your primary care physician to follow up on this.  _______________________________________________________  If you are age 49 or older, your body mass index should be between 23-30. Your Body mass index is 27.65 kg/m. If this is out of the aforementioned range listed, please consider follow up with your Primary Care Provider.  If you are age 68 or younger, your body mass index should be between 19-25. Your Body mass index is 27.65 kg/m. If this is out of the aformentioned range listed, please consider follow up with your Primary Care Provider.   ________________________________________________________  The [facility] providers would like to encourage you to use MYCHART to communicate with providers for non-urgent requests or questions.  Due to long hold times on the telephone, sending your provider a message by [facility] may be a faster and more efficient way to get a response.  Please allow 48 business hours for a response.  Please remember that this is for non-urgent requests.  _______________________________________________________  [facility] is using a team-based approach to care.  Your team is made up of your doctor and two to three APPS. Our APPS (Nurse Practitioners and Physician Assistants) work with your physician to ensure care continuity for you. They are fully qualified to address your health concerns and develop a treatment plan. They communicate directly with your gastroenterologist to care for you. Seeing the Advanced Practice Practitioners on your physician's team can help you by facilitating care more promptly, often allowing for earlier appointments, access to diagnostic testing, procedures, and other specialty referrals.   Your provider has requested that you go to the basement level for lab work before leaving today. Press B on the  elevator. The lab is located at the first door on the left as you exit the elevator.  Due to recent changes in healthcare laws, you may see the results of your imaging and laboratory studies on MyChart before your provider has had a chance to review them.  We understand that in some cases there may be results that are confusing or concerning to you. Not all laboratory results come back in the same time frame and the provider may be waiting for multiple results in order to interpret others.  Please give us  48 hours in order for your provider to thoroughly review all the results before contacting the office for clarification of your results.    First do a trial off milk/lactose products if you use them.  Add fiber like benefiber or citracel once a day Can send in an anti spasm medication, Bentyl , to take as needed Can do heating pad  Hopefully this is post infectious IBS   FODMAP stands for fermentable oligo-, di-, mono-saccharides and polyols (1). These are the scientific terms used to classify groups of carbs that are difficult for our body to digest and that are notorious for triggering digestive symptoms like bloating, gas, loose stools and stomach pain.   You can try low FODMAP diet  - start with eliminating just one column at a time that you feel may be a trigger for you. - the table at the very bottom contains foods that are low in FODMAPs   Sometimes trying to eliminate the FODMAP's from your diet is difficult or tricky, if you are stuggling with trying to do the elimination diet you can try an enzyme.  There is a food enzymes that you sprinkle in or on your food that  helps break down the FODMAP. You can read more about the enzyme by going to this site: https://fodzyme.com/   -Start on florastor 250mg  1 cap BID for 1 month -Hand washing, wiping down surfaces, and avoiding contamination discussed with the patient.  -Follow up if any worsening symptoms or no improving symptoms, ER  precautions discussed  Anal Fissure, Adult  A fissure is a linear defect in the anal mucosa, symptoms include burning, itching, discomfort especially with a bowel movement with associated rectal bleeding.  Risk factors include low fiber diet, chronic constipation and straining. Anal fissures can take a very long time to heal so this will be a 2 to 26-month process.  Treatment for a fissure includes:  -decreasing time in the toilet should not be more than 5 minutes -adding fiber supplement such as Benefiber or Citrucel -increasing water .  Diltiazem/lidocaine  3 x daily for 2 months sent to compound pharmacy    Sent this medication to a compound pharmacy:  [facility] [address] [place], [place], [place] [address]  [phone]  Please DO NOT go directly from our office to pick up this medication! Give the pharmacy 1 day to process the prescription. Extra time is required for them to compound your medication.

## 2024-10-10 NOTE — Progress Notes (Deleted)
   [address] [place] [address] Dept: [phone]  FOLLOW UP NOTE  Patient ID: Lisa Townsend, female    DOB: 1976/05/14  Age: 48 y.o. MRN: [id number] Date of Office Visit: 10/10/2024  Assessment  Chief Complaint: No chief complaint on file.  HPI Ambert Virrueta Gutierrez   Discussed the use of AI scribe software for clinical note transcription with the patient, who gave verbal consent to proceed.  History of Present Illness      Drug Allergies:  Allergies  Allergen Reactions   Claritin [Loratadine]     shakiness   Latex Itching   Lexapro  [Escitalopram ] Nausea Only    Physical Exam: LMP 12/19/2023    Physical Exam  Diagnostics:    Assessment and Plan: No diagnosis found.  No orders of the defined types were placed in this encounter.   There are no Patient Instructions on file for this visit.  No follow-ups on file.    Thank you for the opportunity to care for this patient.  Please do not hesitate to contact me with questions.  Arlean Mutter, FNP [facility]

## 2024-10-11 ENCOUNTER — Ambulatory Visit: Payer: Self-pay | Admitting: Physician Assistant

## 2024-10-11 DIAGNOSIS — R197 Diarrhea, unspecified: Secondary | ICD-10-CM

## 2024-10-11 DIAGNOSIS — R7989 Other specified abnormal findings of blood chemistry: Secondary | ICD-10-CM

## 2024-10-14 ENCOUNTER — Other Ambulatory Visit

## 2024-10-14 DIAGNOSIS — R7989 Other specified abnormal findings of blood chemistry: Secondary | ICD-10-CM | POA: Diagnosis not present

## 2024-10-14 LAB — HEPATIC FUNCTION PANEL
ALT: 85 U/L — ABNORMAL HIGH (ref 0–35)
AST: 63 U/L — ABNORMAL HIGH (ref 0–37)
Albumin: 3.7 g/dL (ref 3.5–5.2)
Alkaline Phosphatase: 89 U/L (ref 39–117)
Bilirubin, Direct: 0 mg/dL (ref 0.0–0.3)
Total Bilirubin: 0.3 mg/dL (ref 0.2–1.2)
Total Protein: 6.2 g/dL (ref 6.0–8.3)

## 2024-10-15 ENCOUNTER — Ambulatory Visit

## 2024-10-15 ENCOUNTER — Ambulatory Visit: Payer: Self-pay

## 2024-10-15 ENCOUNTER — Telehealth: Payer: Self-pay | Admitting: Gastroenterology

## 2024-10-15 ENCOUNTER — Ambulatory Visit: Payer: Self-pay | Admitting: Gastroenterology

## 2024-10-15 DIAGNOSIS — R7989 Other specified abnormal findings of blood chemistry: Secondary | ICD-10-CM

## 2024-10-15 DIAGNOSIS — R197 Diarrhea, unspecified: Secondary | ICD-10-CM

## 2024-10-15 LAB — HEPATITIS B SURFACE ANTIBODY,QUALITATIVE: Hep B S Ab: NONREACTIVE

## 2024-10-15 LAB — CK: Total CK: 79 U/L (ref 17–177)

## 2024-10-15 LAB — HEPATITIS C ANTIBODY: Hepatitis C Ab: NONREACTIVE

## 2024-10-15 LAB — HEPATITIS B SURFACE ANTIGEN: Hepatitis B Surface Ag: NONREACTIVE

## 2024-10-15 LAB — HEPATITIS A ANTIBODY, TOTAL: Hepatitis A AB,Total: REACTIVE — AB

## 2024-10-15 MED ORDER — VANCOMYCIN HCL 125 MG PO CAPS: 125.0000 mg | ORAL_CAPSULE | Freq: Four times a day (QID) | ORAL | 0 refills | Status: AC

## 2024-10-15 NOTE — Addendum Note (Signed)
 Addended by: Olivea Sonnen N on: 10/15/2024 04:09 PM   Modules accepted: Orders

## 2024-10-15 NOTE — Telephone Encounter (Signed)
 Reviewed Lisa Townsend's stool studies while she is out in our box and this patient showed positive C diff per [facility] testing.  Stop augmentin  (if still on per her previous note) Start Vancomycin   - oral vancomycin  (125 mg four times daily for 10 days) -Start on florastor 250mg  1 cap BID for 1 month -Hand washing, wiping down surfaces, and avoiding contamination discussed with the patient.  -Avoid ABX in the future -Follow up 4 weeks (with Lisa Townsend) -Follow up if any worsening symptoms or no improving symptoms, ER precautions discussed

## 2024-10-15 NOTE — Telephone Encounter (Signed)
 Vancomycin  prescription sent to [facility] pharmacy on file. MyChart message sent to patient with results and recommendations. Patient already has a follow up with Alan, [place] in January.

## 2024-10-17 LAB — OVA AND PARASITE EXAMINATION
CONCENTRATE RESULT:: NONE SEEN
MICRO NUMBER:: 17326175
SPECIMEN QUALITY:: ADEQUATE
TRICHROME RESULT:: NONE SEEN

## 2024-10-21 NOTE — Addendum Note (Signed)
 Addended by: CRAIG PALMA on: 10/21/2024 09:10 AM   Modules accepted: Orders

## 2024-10-29 ENCOUNTER — Encounter: Payer: Self-pay | Admitting: Physician Assistant

## 2024-10-30 ENCOUNTER — Emergency Department (HOSPITAL_BASED_OUTPATIENT_CLINIC_OR_DEPARTMENT_OTHER): Payer: Self-pay

## 2024-10-30 ENCOUNTER — Other Ambulatory Visit: Payer: Self-pay

## 2024-10-30 ENCOUNTER — Encounter (HOSPITAL_BASED_OUTPATIENT_CLINIC_OR_DEPARTMENT_OTHER): Payer: Self-pay | Admitting: Emergency Medicine

## 2024-10-30 ENCOUNTER — Emergency Department (HOSPITAL_BASED_OUTPATIENT_CLINIC_OR_DEPARTMENT_OTHER)
Admission: EM | Admit: 2024-10-30 | Discharge: 2024-10-31 | Disposition: A | Discharge status: Home / Self Care | Payer: Self-pay | Source: Home / Self Care | Attending: Emergency Medicine | Admitting: Emergency Medicine

## 2024-10-30 DIAGNOSIS — K529 Noninfective gastroenteritis and colitis, unspecified: Secondary | ICD-10-CM | POA: Insufficient documentation

## 2024-10-30 DIAGNOSIS — Z91040 Latex allergy status: Secondary | ICD-10-CM | POA: Insufficient documentation

## 2024-10-30 DIAGNOSIS — D72829 Elevated white blood cell count, unspecified: Secondary | ICD-10-CM | POA: Insufficient documentation

## 2024-10-30 DIAGNOSIS — B974 Respiratory syncytial virus as the cause of diseases classified elsewhere: Secondary | ICD-10-CM | POA: Insufficient documentation

## 2024-10-30 DIAGNOSIS — B338 Other specified viral diseases: Secondary | ICD-10-CM

## 2024-10-30 DIAGNOSIS — R1084 Generalized abdominal pain: Secondary | ICD-10-CM

## 2024-10-30 LAB — RESP PANEL BY RT-PCR (RSV, FLU A&B, COVID)  RVPGX2
Influenza A by PCR: NEGATIVE
Influenza B by PCR: NEGATIVE
Resp Syncytial Virus by PCR: POSITIVE — AB
SARS Coronavirus 2 by RT PCR: NEGATIVE

## 2024-10-30 LAB — CBC
HCT: 39.8 % (ref 36.0–46.0)
Hemoglobin: 13.6 g/dL (ref 12.0–15.0)
MCH: 31.6 pg (ref 26.0–34.0)
MCHC: 34.2 g/dL (ref 30.0–36.0)
MCV: 92.6 fL (ref 80.0–100.0)
Platelets: 305 K/uL (ref 150–400)
RBC: 4.3 MIL/uL (ref 3.87–5.11)
RDW: 12.2 % (ref 11.5–15.5)
WBC: 13.6 K/uL — ABNORMAL HIGH (ref 4.0–10.5)
nRBC: 0 % (ref 0.0–0.2)

## 2024-10-30 LAB — PREGNANCY, URINE: Preg Test, Ur: NEGATIVE

## 2024-10-30 LAB — URINALYSIS, ROUTINE W REFLEX MICROSCOPIC
Bilirubin Urine: NEGATIVE
Glucose, UA: NEGATIVE mg/dL
Hgb urine dipstick: NEGATIVE
Ketones, ur: 15 mg/dL — AB
Leukocytes,Ua: NEGATIVE
Nitrite: NEGATIVE
Protein, ur: NEGATIVE mg/dL
Specific Gravity, Urine: 1.025 (ref 1.005–1.030)
pH: 5.5 (ref 5.0–8.0)

## 2024-10-30 LAB — COMPREHENSIVE METABOLIC PANEL WITH GFR
ALT: 81 U/L — ABNORMAL HIGH (ref 0–44)
AST: 42 U/L — ABNORMAL HIGH (ref 15–41)
Albumin: 4 g/dL (ref 3.5–5.0)
Alkaline Phosphatase: 100 U/L (ref 38–126)
Anion gap: 12 (ref 5–15)
BUN: 10 mg/dL (ref 6–20)
CO2: 26 mmol/L (ref 22–32)
Calcium: 8.4 mg/dL — ABNORMAL LOW (ref 8.9–10.3)
Chloride: 100 mmol/L (ref 98–111)
Creatinine, Ser: 0.75 mg/dL (ref 0.44–1.00)
GFR, Estimated: >60 mL/min
Glucose, Bld: 120 mg/dL — ABNORMAL HIGH (ref 70–99)
Potassium: 4 mmol/L (ref 3.5–5.1)
Sodium: 138 mmol/L (ref 135–145)
Total Bilirubin: 0.3 mg/dL (ref 0.0–1.2)
Total Protein: 6.4 g/dL — ABNORMAL LOW (ref 6.5–8.1)

## 2024-10-30 LAB — LIPASE, BLOOD: Lipase: 18 U/L (ref 11–51)

## 2024-10-30 MED ORDER — FIDAXOMICIN 200 MG PO TABS: 200.0000 mg | ORAL_TABLET | Freq: Once | ORAL | Status: DC

## 2024-10-30 MED ORDER — ONDANSETRON HCL 4 MG/2ML IJ SOLN
4.0000 mg | Freq: Once | INTRAMUSCULAR | Status: AC
  Administered 2024-10-30: 4 mg via INTRAVENOUS
  Filled 2024-10-30: qty 2

## 2024-10-30 MED ORDER — MORPHINE SULFATE (PF) 4 MG/ML IV SOLN
4.0000 mg | Freq: Once | INTRAVENOUS | Status: AC
  Administered 2024-10-30: 4 mg via INTRAVENOUS
  Filled 2024-10-30: qty 1

## 2024-10-30 MED ORDER — LACTATED RINGERS IV BOLUS
1000.0000 mL | Freq: Once | INTRAVENOUS | Status: AC
  Administered 2024-10-30: 1000 mL via INTRAVENOUS

## 2024-10-30 MED ORDER — IOHEXOL 300 MG/ML  SOLN
100.0000 mL | Freq: Once | INTRAMUSCULAR | Status: AC | PRN
  Administered 2024-10-30: 100 mL via INTRAVENOUS

## 2024-10-30 MED ORDER — FIDAXOMICIN 200 MG PO TABS: 200.0000 mg | ORAL_TABLET | Freq: Two times a day (BID) | ORAL | 0 refills | Status: DC

## 2024-10-30 MED ORDER — DICYCLOMINE HCL 10 MG PO CAPS
10.0000 mg | ORAL_CAPSULE | Freq: Once | ORAL | Status: AC
  Administered 2024-10-30: 10 mg via ORAL
  Filled 2024-10-30: qty 1

## 2024-10-30 NOTE — ED Notes (Addendum)
 Patient tolerated PO challenge well. Patient denies N/V/D at this time. Patient given saltine crackers and additional ginger ale.

## 2024-10-30 NOTE — ED Provider Notes (Signed)
 " [place] [facility] Provider Note   CSN: [id number] Arrival date & time: 10/30/24  1521     Patient presents with: Abdominal Pain   Lisa Townsend is a 48 y.o. female.   Patient is here for evaluation of abdominal pain, nausea, vomiting, and diarrhea for the past 2 days.  Patient recently treated in our facility for colitis on 11/30.  She has established with GI since that time and called their office earlier today and was able to make an appointment for Monday in their office.  She also reports cough for the last 2 to 3 days.  She denies any recent fevers.  She has not been around anybody with similar symptoms.  She denies any abnormal food intake.  She has tried her prescribed Zofran  at home with minimal relief.  She reports pain is similar to when she came in for evaluation our facility on 11/30, then goes on to say that it is actually much worse.  During evaluation by GI office post ED visit in late November she was found to have C. difficile and was started on oral vancomycin  on 12/9, this was a 10-day course.  The history is provided by the patient.  Abdominal Pain      Prior to Admission medications  Medication Sig Start Date End Date Taking? Authorizing Provider  fidaxomicin  (DIFICID ) 200 MG TABS tablet Take 1 tablet (200 mg total) by mouth 2 (two) times daily for 10 days. 10/30/24 11/09/24 Yes Rosina Almarie LABOR, PA-C  AMBULATORY NON FORMULARY MEDICATION Medication Name: Diltiazem 2%/Lidocaine  2% Using your index finger apply a small amount of medication inside the anal opening and to the external anal area twice daily x 6 weeks. 10/10/24   Craig Alan SAUNDERS, PA-C  amoxicillin -clavulanate (AUGMENTIN ) 875-125 MG tablet Take 1 tablet by mouth every 12 (twelve) hours. 10/06/24   Odell Balls, PA-C  diazepam  (VALIUM ) 10 MG tablet TAKE 1/2 TO 1 TABLET(5 TO 10 MG) BY MOUTH AT BEDTIME AS NEEDED FOR SLEEP 02/06/23   Thedora Garnette HERO, MD   dicyclomine  (BENTYL ) 20 MG tablet Take 1 tablet (20 mg total) by mouth 4 (four) times daily -  before meals and at bedtime. 10/09/24   Craig Alan SAUNDERS, PA-C  EPINEPHrine  0.3 mg/0.3 mL IJ SOAJ injection Inject 0.3 mg into the muscle once as needed for up to 1 dose for anaphylaxis. 06/22/23   Iva Marty Saltness, MD  estradiol  (ESTRACE ) 0.1 MG/GM vaginal cream APPLY 1/2 GRAM TO VULVA NIGHTLY FOR 2 WEEKS THEN DECREASE TO 1/2 GRAM TO VULVA TWO NIGHTS A WEEK 06/12/24   Chrzanowski, Jami B, NP  estradiol -norethindrone  (COMBIPATCH) 0.05-0.14 MG/DAY Place 1 patch onto the skin 2 (two) times a week. 08/15/24   Chrzanowski, Jami B, NP  levothyroxine  (SYNTHROID ) 50 MCG tablet Take 50 mcg by mouth daily before breakfast. 09/27/24   [provider]  liothyronine  (CYTOMEL ) 5 MCG tablet Take 5 mcg by mouth 2 (two) times daily.    [provider]  NONFORMULARY OR COMPOUNDED ITEM Compounded Testosterone  PLO GEL 4% Apply pea sized amount to inner thigh qhs 04/30/24   Chrzanowski, Jami B, NP  ondansetron  (ZOFRAN -ODT) 4 MG disintegrating tablet Take 1 tablet (4 mg total) by mouth every 8 (eight) hours as needed for nausea or vomiting. 10/06/24   Odell Balls, PA-C  sertraline  (ZOLOFT ) 50 MG tablet Take 50 mg by mouth daily.    [provider]    Allergies: Claritin [loratadine], Latex, and  Lexapro  [escitalopram ]    Review of Systems  Gastrointestinal:  Positive for abdominal pain.    Updated Vital Signs BP 98/65 (BP Location: Left Arm)   Pulse 69   Temp 98.8 F (37.1 C) (Oral)   Resp 16   Ht 5' 1 (1.549 m)   Wt 61.2 kg   LMP 12/19/2023   SpO2 97%   BMI 25.51 kg/m   Physical Exam Vitals and nursing note reviewed.  Constitutional:      General: She is not in acute distress.    Appearance: Normal appearance. She is well-developed and normal weight. She is not ill-appearing, toxic-appearing or diaphoretic.  HENT:     Head: Normocephalic and atraumatic.     Right Ear:  Tympanic membrane, ear canal and external ear normal.     Left Ear: Tympanic membrane, ear canal and external ear normal.     Nose: Nose normal.     Mouth/Throat:     Mouth: Mucous membranes are moist.  Eyes:     General: No scleral icterus.    Extraocular Movements: Extraocular movements intact.     Conjunctiva/sclera: Conjunctivae normal.  Cardiovascular:     Rate and Rhythm: Normal rate and regular rhythm.     Pulses: Normal pulses.     Heart sounds: Normal heart sounds.  Pulmonary:     Effort: Pulmonary effort is normal. No respiratory distress.     Breath sounds: Normal breath sounds. No stridor. No wheezing, rhonchi or rales.  Abdominal:     General: Abdomen is flat. There is no distension.     Palpations: Abdomen is soft.     Tenderness: There is generalized abdominal tenderness. There is guarding. Negative signs include Murphy's sign, Rovsing's sign and McBurney's sign.  Musculoskeletal:     Cervical back: Normal range of motion.  Skin:    General: Skin is warm and dry.     Capillary Refill: Capillary refill takes less than 2 seconds.     Coloration: Skin is not jaundiced or pale.  Neurological:     Mental Status: She is alert and oriented to person, place, and time.     Gait: Gait normal.     (all labs ordered are listed, but only abnormal results are displayed) Labs Reviewed  RESP PANEL BY RT-PCR (RSV, FLU A&B, COVID)  RVPGX2 - Abnormal; Notable for the following components:      Result Value   Resp Syncytial Virus by PCR POSITIVE (*)    All other components within normal limits  COMPREHENSIVE METABOLIC PANEL WITH GFR - Abnormal; Notable for the following components:   Glucose, Bld 120 (*)    Calcium 8.4 (*)    Total Protein 6.4 (*)    AST 42 (*)    ALT 81 (*)    All other components within normal limits  CBC - Abnormal; Notable for the following components:   WBC 13.6 (*)    All other components within normal limits  URINALYSIS, ROUTINE W REFLEX MICROSCOPIC  - Abnormal; Notable for the following components:   Ketones, ur 15 (*)    All other components within normal limits  C DIFFICILE QUICK SCREEN W PCR REFLEX    LIPASE, BLOOD  PREGNANCY, URINE    EKG: None  Radiology: CT ABDOMEN PELVIS W CONTRAST Result Date: 10/30/2024 EXAM: CT ABDOMEN AND PELVIS WITH CONTRAST 10/30/2024 09:34:01 PM TECHNIQUE: CT of the abdomen and pelvis was performed with the administration of 100 mL of iohexol  (OMNIPAQUE ) 300 MG/ML solution. Multiplanar  reformatted images are provided for review. Automated exposure control, iterative reconstruction, and/or weight-based adjustment of the mA/kV was utilized to reduce the radiation dose to as low as reasonably achievable. COMPARISON: CT evidence dated 10/06/2024. CLINICAL HISTORY: Abdominal pain, acute, nonlocalized. FINDINGS: LOWER CHEST: New bronchiolectasis and associated ground-glass opacities and interstitial thickening in a wedge-shaped distribution in the posterior right lower lobe. LIVER: The liver is unremarkable. GALLBLADDER AND BILE DUCTS: Gallbladder is unremarkable. No biliary ductal dilatation. SPLEEN: No acute abnormality. PANCREAS: No acute abnormality. ADRENAL GLANDS: No acute abnormality. KIDNEYS, URETERS AND BLADDER: No stones in the kidneys or ureters. No hydronephrosis. No perinephric or periureteral stranding. Urinary bladder is unremarkable. GI AND BOWEL: Diffuse colonic wall thickening with mucosal hyperenhancement greatest about the ascending and transverse colon. The colitis is unchanged from the 10/06/2024. Normal appendix. Stomach demonstrates no acute abnormality. There is no bowel obstruction. PERITONEUM AND RETROPERITONEUM: No ascites. No free air. VASCULATURE: Aorta is normal in caliber. LYMPH NODES: No lymphadenopathy. REPRODUCTIVE ORGANS: No acute abnormality. BONES AND SOFT TISSUES: No acute osseous abnormality. No focal soft tissue abnormality. IMPRESSION: 1. Pancolitis similar to 11 / 30 / 25. 2.  Small airway infection / inflammation in the posterior right lower lobe. Electronically signed by: Norman Gatlin MD 10/30/2024 09:42 PM EST RP Workstation: [id number]     Procedures   Medications Ordered in the ED  fidaxomicin  (DIFICID ) tablet 200 mg ( Oral Canceled Entry 10/30/24 2359)  lactated ringers  bolus 1,000 mL (0 mLs Intravenous Stopped 10/30/24 2318)  ondansetron  (ZOFRAN ) injection 4 mg (4 mg Intravenous Given 10/30/24 2049)  morphine  (PF) 4 MG/ML injection 4 mg (4 mg Intravenous Given 10/30/24 2110)  iohexol  (OMNIPAQUE ) 300 MG/ML solution 100 mL (100 mLs Intravenous Contrast Given 10/30/24 2124)  dicyclomine  (BENTYL ) capsule 10 mg (10 mg Oral Given 10/30/24 2357)  ondansetron  (ZOFRAN ) injection 4 mg (4 mg Intravenous Given 10/31/24 0013)     Medical Decision Making Amount and/or Complexity of Data Reviewed Labs: ordered. Radiology: ordered.  Risk Prescription drug management.   Patient presents to the ED for concern of abdominal pain with N/V/D, this involves an extensive number of treatment options, and is a complaint that carries with it a high risk of complications and morbidity.  The differential diagnosis includes viral gastritis, colitis, foodborne illness, influenza, pancreatitis, peptic ulcer disease, UTI, continued C diff infection.   Co morbidities that complicate the patient evaluation  Recent history of colitis and C. difficile infection   Additional history obtained:  Additional history obtained from  Outside Medical Records   External records from outside source obtained and reviewed including recent GI visit notes and treatment plan.   Lab Tests:  I Ordered, and personally interpreted labs.  The pertinent results include: Leukocytosis of 13.6.  Afebrile and nontachycardic.  Respiratory panel positive for RSV.   Imaging Studies ordered:  I ordered imaging studies including CT abdomen pelvis Imaging which showed pancolitis similar to 11/30  imaging.  Also seen was a small airway infection/inflammation in the posterior right lower lobe. I agree with the radiologist interpretation   Medicines ordered and prescription drug management:  I ordered medication including fluids, Zofran , morphine , Bentyl  for abdominal pain, nausea Reevaluation of the patient after these medicines showed that the patient improved   Problem List / ED Course:  Clinical Course as of 10/31/24 0041  Wed Oct 30, 2024  2221 Patient informed of CT imaging and respiratory panel results.  We will start p.o. challenge and patient will attempt to give us   a stool sample. [EA]  2350 Patient able to provide stool sample and able to tolerate p.o. intake.  Continues to complain of abdominal pain with a soft blood pressure.  We will try Bentyl  and start antibiotics for suspected continued C. difficile infection. [EA]  Thu Oct 31, 2024  0012 Informed that we do not have the antibiotics available in our facility to start them here.  Patient requesting additional antiemetics and Zofran  ordered.  Plan to see how patient does with Bentyl .  If she does well with this medication then we can send her home with Zofran  and Bentyl  as well as prescription antibiotics to pharmacy.  She already has an appointment scheduled with her GI specialist on Monday.  I do not feel that she needs to stay until C. difficile results come through. [EA]    Clinical Course User Index [EA] Burna, Atlas, PA-C    Possible continued C. difficile infection.  CT abdominal pelvis results similar to results found on 11/30 when the patient was suffering from C. difficile related pancolitis.  She completed p.o. vancomycin  on 12/19.  She is having similar symptoms to previous colitis.  Pain medication and antiemetics given.  Stool sample collected for testing.  Patient continuing to complain of nausea and abdominal pain.  These are still being managed at provider signout. RSV.  Physical exam, vitals, and  history are also consistent with viral URI with no acute or concerning respiratory findings.  Respiratory panel positive for RSV.  CT finding of small airway infection/inflammation in the posterior right lower lobe is consistent with RSV infection.  Spoke with patient about diagnosis, outpatient treatment, return precautions and they verbalized understanding.   Reevaluation:  After the interventions noted above, I reevaluated the patient and found that they have :improved   Dispostion:  Symptom control is still pending at provider signout. Care transferred to Almarie Burner MD.   This note was produced using Dragon Medical voice recognition. While the provider has reviewed and verified all clinical information, transcription errors may remain.    Final diagnoses:  Generalized abdominal pain  Infection due to respiratory syncytial virus (RSV), unspecified infection type  Pancolitis    ED Discharge Orders          Ordered    fidaxomicin  (DIFICID ) 200 MG TABS tablet  2 times daily        10/30/24 2208               Zali, Kamaka, PA-C 10/31/24 0041  "

## 2024-10-30 NOTE — ED Triage Notes (Signed)
 Pt c/o lower abdominal pain radiating to upper abdomen. Started yesterday. Pain rated 9/10 with n/v/d. PMH colitis with similar symptoms.

## 2024-10-30 NOTE — ED Notes (Signed)
 Patient aware of necessity of stool sample.

## 2024-10-30 NOTE — ED Notes (Signed)
 Patient given ginger ale for PO challenge per provider's request.

## 2024-10-30 NOTE — ED Provider Notes (Incomplete)
 " [place] [facility] Provider Note   CSN: [id number] Arrival date & time: 10/30/24  1521     Patient presents with: Abdominal Pain   Lisa Townsend is a 48 y.o. female.   Patient is here for evaluation of abdominal pain, nausea, vomiting, and diarrhea for the past 2 days.  Patient recently treated in our facility for colitis on 11/30.  She has established with GI since that time and called their office earlier today and was able to make an appointment for Monday in their office.  She also reports cough for the last 2 to 3 days.  She denies any recent fevers.  She has not been around anybody with similar symptoms.  She denies any abnormal food intake.  She has tried her prescribed Zofran  at home with minimal relief.  She reports pain is similar to when she came in for evaluation our facility on 11/30, then goes on to say that it is actually much worse.  During evaluation by GI office post ED visit in late November she was found to have C. difficile and was started on oral vancomycin  on 12/9, this was a 10-day course.  The history is provided by the patient.  Abdominal Pain      Prior to Admission medications  Medication Sig Start Date End Date Taking? Authorizing Provider  AMBULATORY NON FORMULARY MEDICATION Medication Name: Diltiazem 2%/Lidocaine  2% Using your index finger apply a small amount of medication inside the anal opening and to the external anal area twice daily x 6 weeks. 10/10/24   Collier, Amanda R, PA-C  amoxicillin -clavulanate (AUGMENTIN ) 875-125 MG tablet Take 1 tablet by mouth every 12 (twelve) hours. 10/06/24   Odell Balls, PA-C  diazepam  (VALIUM ) 10 MG tablet TAKE 1/2 TO 1 TABLET(5 TO 10 MG) BY MOUTH AT BEDTIME AS NEEDED FOR SLEEP 02/06/23   Thedora Garnette HERO, MD  dicyclomine  (BENTYL ) 20 MG tablet Take 1 tablet (20 mg total) by mouth 4 (four) times daily -  before meals and at bedtime. 10/09/24   Craig Alan SAUNDERS, PA-C   EPINEPHrine  0.3 mg/0.3 mL IJ SOAJ injection Inject 0.3 mg into the muscle once as needed for up to 1 dose for anaphylaxis. 06/22/23   Iva Marty Saltness, MD  estradiol  (ESTRACE ) 0.1 MG/GM vaginal cream APPLY 1/2 GRAM TO VULVA NIGHTLY FOR 2 WEEKS THEN DECREASE TO 1/2 GRAM TO VULVA TWO NIGHTS A WEEK 06/12/24   Chrzanowski, Jami B, NP  estradiol -norethindrone  (COMBIPATCH) 0.05-0.14 MG/DAY Place 1 patch onto the skin 2 (two) times a week. 08/15/24   Chrzanowski, Jami B, NP  levothyroxine  (SYNTHROID ) 50 MCG tablet Take 50 mcg by mouth daily before breakfast. 09/27/24   [provider]  liothyronine (CYTOMEL) 5 MCG tablet Take 5 mcg by mouth 2 (two) times daily.    [provider]  NONFORMULARY OR COMPOUNDED ITEM Compounded Testosterone  PLO GEL 4% Apply pea sized amount to inner thigh qhs 04/30/24   Chrzanowski, Jami B, NP  ondansetron  (ZOFRAN -ODT) 4 MG disintegrating tablet Take 1 tablet (4 mg total) by mouth every 8 (eight) hours as needed for nausea or vomiting. 10/06/24   Odell Balls, PA-C  sertraline  (ZOLOFT ) 50 MG tablet Take 50 mg by mouth daily.    [provider]    Allergies: Claritin [loratadine], Latex, and Lexapro  [escitalopram ]    Review of Systems  Gastrointestinal:  Positive for abdominal pain.    Updated Vital Signs BP 114/69 (BP Location: Left Arm)  Pulse 68   Temp 98.9 F (37.2 C) (Oral)   Resp 16   Ht 5' 1 (1.549 m)   Wt 61.2 kg   LMP 12/19/2023   SpO2 97%   BMI 25.51 kg/m   Physical Exam Vitals and nursing note reviewed.  Constitutional:      General: She is not in acute distress.    Appearance: Normal appearance. She is well-developed and normal weight. She is not ill-appearing, toxic-appearing or diaphoretic.  HENT:     Head: Normocephalic and atraumatic.     Right Ear: Tympanic membrane, ear canal and external ear normal.     Left Ear: Tympanic membrane, ear canal and external ear normal.     Nose: Nose normal.      Mouth/Throat:     Mouth: Mucous membranes are moist.  Eyes:     General: No scleral icterus.    Extraocular Movements: Extraocular movements intact.     Conjunctiva/sclera: Conjunctivae normal.  Cardiovascular:     Rate and Rhythm: Normal rate and regular rhythm.     Pulses: Normal pulses.     Heart sounds: Normal heart sounds.  Pulmonary:     Effort: Pulmonary effort is normal. No respiratory distress.     Breath sounds: Normal breath sounds. No stridor. No wheezing, rhonchi or rales.  Abdominal:     General: Abdomen is flat. There is no distension.     Palpations: Abdomen is soft.     Tenderness: There is generalized abdominal tenderness. There is guarding. Negative signs include Murphy's sign, Rovsing's sign and McBurney's sign.  Musculoskeletal:     Cervical back: Normal range of motion.  Skin:    General: Skin is warm and dry.     Capillary Refill: Capillary refill takes less than 2 seconds.     Coloration: Skin is not jaundiced or pale.  Neurological:     Mental Status: She is alert and oriented to person, place, and time.     Gait: Gait normal.     (all labs ordered are listed, but only abnormal results are displayed) Labs Reviewed  COMPREHENSIVE METABOLIC PANEL WITH GFR - Abnormal; Notable for the following components:      Result Value   Glucose, Bld 120 (*)    Calcium 8.4 (*)    Total Protein 6.4 (*)    AST 42 (*)    ALT 81 (*)    All other components within normal limits  CBC - Abnormal; Notable for the following components:   WBC 13.6 (*)    All other components within normal limits  URINALYSIS, ROUTINE W REFLEX MICROSCOPIC - Abnormal; Notable for the following components:   Ketones, ur 15 (*)    All other components within normal limits  RESP PANEL BY RT-PCR (RSV, FLU A&B, COVID)  RVPGX2  LIPASE, BLOOD  PREGNANCY, URINE    EKG: None  Radiology: No results found.  {Document cardiac monitor, telemetry assessment procedure when  appropriate:32947} Procedures   Medications Ordered in the ED  lactated ringers  bolus 1,000 mL (1,000 mLs Intravenous New Bag/Given 10/30/24 2049)  ondansetron  (ZOFRAN ) injection 4 mg (4 mg Intravenous Given 10/30/24 2049)  morphine  (PF) 4 MG/ML injection 4 mg (4 mg Intravenous Given 10/30/24 2110)  iohexol  (OMNIPAQUE ) 300 MG/ML solution 100 mL (100 mLs Intravenous Contrast Given 10/30/24 2124)     Medical Decision Making Amount and/or Complexity of Data Reviewed Labs: ordered. Radiology: ordered.  Risk Prescription drug management.   Patient presents to the ED for  concern of abdominal pain with N/V/D, this involves an extensive number of treatment options, and is a complaint that carries with it a high risk of complications and morbidity.  The differential diagnosis includes viral gastritis, colitis, foodborne illness, influenza, pancreatitis, peptic ulcer disease, UTI.   Co morbidities that complicate the patient evaluation  Recent history of colitis and C. difficile infection   Additional history obtained:  Additional history obtained from  Outside Medical Records   External records from outside source obtained and reviewed including recent GI visit notes and treatment plan.   Lab Tests:  I Ordered, and personally interpreted labs.  The pertinent results include:  ***   Imaging Studies ordered:  I ordered imaging studies including CT abdomen pelvis Imaging which showed *** I agree with the radiologist interpretation   Medicines ordered and prescription drug management:  I ordered medication including fluids, Zofran , morphine , Bentyl ,***for abdominal pain, nausea Reevaluation of the patient after these medicines showed that the patient improved   Consultations Obtained:  I requested consultation with the ***,  and discussed lab and imaging findings as well as pertinent plan - they recommend: ***   Problem List / ED Course:  Clinical Course as of 10/30/24  2356  Wed Oct 30, 2024  2221 Patient informed of CT imaging and respiratory panel results.  We will start p.o. challenge and patient will attempt to give us  a stool sample. [EA]  2350 Patient able to provide stool sample and able to tolerate p.o. intake.  Continues to complain of abdominal pain with a soft blood pressure.  We will try Bentyl  and start antibiotics for suspected continued C. difficile infection. [EA]    Clinical Course User Index [EA] Bruchy, Mikel, PA-C    Possible continued C. difficile infection. RSV.  Respiratory panel positive for RSV. ***   Reevaluation:  After the interventions noted above, I reevaluated the patient and found that they have :{resolved/improved/worsened:23923::improved}   Dispostion:  After consideration of the diagnostic results and the patients response to treatment, I feel that the patent would benefit from ***.    This note was produced using Electronics Engineer. While the provider has reviewed and verified all clinical information, transcription errors may remain.    Final diagnoses:  None    ED Discharge Orders     None        "

## 2024-10-30 NOTE — ED Notes (Signed)
 Report received from [place], [place]. Assuming pt care at this time.

## 2024-10-30 NOTE — ED Notes (Signed)
Patient transported to imaging at this time.

## 2024-10-30 NOTE — ED Notes (Signed)
 Patient requesting pain medication. Provider notified. Pending new orders.

## 2024-10-31 ENCOUNTER — Encounter (HOSPITAL_BASED_OUTPATIENT_CLINIC_OR_DEPARTMENT_OTHER): Payer: Self-pay

## 2024-10-31 ENCOUNTER — Inpatient Hospital Stay (HOSPITAL_BASED_OUTPATIENT_CLINIC_OR_DEPARTMENT_OTHER)
Admission: EM | Admit: 2024-10-31 | Discharge: 2024-11-07 | DRG: 372 | Disposition: A | Discharge status: Home / Self Care | Attending: Internal Medicine | Admitting: Internal Medicine

## 2024-10-31 ENCOUNTER — Observation Stay (HOSPITAL_COMMUNITY)

## 2024-10-31 DIAGNOSIS — F411 Generalized anxiety disorder: Secondary | ICD-10-CM | POA: Diagnosis present

## 2024-10-31 DIAGNOSIS — E039 Hypothyroidism, unspecified: Secondary | ICD-10-CM | POA: Diagnosis present

## 2024-10-31 DIAGNOSIS — J210 Acute bronchiolitis due to respiratory syncytial virus: Secondary | ICD-10-CM | POA: Diagnosis present

## 2024-10-31 DIAGNOSIS — E876 Hypokalemia: Secondary | ICD-10-CM | POA: Diagnosis present

## 2024-10-31 DIAGNOSIS — A0472 Enterocolitis due to Clostridium difficile, not specified as recurrent: Principal | ICD-10-CM | POA: Diagnosis present

## 2024-10-31 DIAGNOSIS — Z888 Allergy status to other drugs, medicaments and biological substances status: Secondary | ICD-10-CM

## 2024-10-31 DIAGNOSIS — F5101 Primary insomnia: Secondary | ICD-10-CM | POA: Diagnosis present

## 2024-10-31 DIAGNOSIS — D72829 Elevated white blood cell count, unspecified: Secondary | ICD-10-CM

## 2024-10-31 DIAGNOSIS — R112 Nausea with vomiting, unspecified: Secondary | ICD-10-CM | POA: Diagnosis not present

## 2024-10-31 DIAGNOSIS — Z800 Family history of malignant neoplasm of digestive organs: Secondary | ICD-10-CM

## 2024-10-31 DIAGNOSIS — B338 Other specified viral diseases: Secondary | ICD-10-CM | POA: Diagnosis present

## 2024-10-31 DIAGNOSIS — Z79890 Hormone replacement therapy (postmenopausal): Secondary | ICD-10-CM

## 2024-10-31 DIAGNOSIS — G4700 Insomnia, unspecified: Secondary | ICD-10-CM | POA: Diagnosis present

## 2024-10-31 DIAGNOSIS — Z1152 Encounter for screening for COVID-19: Secondary | ICD-10-CM

## 2024-10-31 DIAGNOSIS — K567 Ileus, unspecified: Secondary | ICD-10-CM | POA: Diagnosis not present

## 2024-10-31 DIAGNOSIS — J9811 Atelectasis: Secondary | ICD-10-CM

## 2024-10-31 DIAGNOSIS — Z79899 Other long term (current) drug therapy: Secondary | ICD-10-CM

## 2024-10-31 DIAGNOSIS — Z803 Family history of malignant neoplasm of breast: Secondary | ICD-10-CM

## 2024-10-31 DIAGNOSIS — G43709 Chronic migraine without aura, not intractable, without status migrainosus: Secondary | ICD-10-CM | POA: Diagnosis present

## 2024-10-31 DIAGNOSIS — Z8249 Family history of ischemic heart disease and other diseases of the circulatory system: Secondary | ICD-10-CM

## 2024-10-31 DIAGNOSIS — F32A Depression, unspecified: Secondary | ICD-10-CM | POA: Diagnosis present

## 2024-10-31 DIAGNOSIS — Z91040 Latex allergy status: Secondary | ICD-10-CM

## 2024-10-31 LAB — CBC WITH DIFFERENTIAL/PLATELET
Abs Immature Granulocytes: 0.11 K/uL — ABNORMAL HIGH (ref 0.00–0.07)
Basophils Absolute: 0 K/uL (ref 0.0–0.1)
Basophils Relative: 0 %
Eosinophils Absolute: 0 K/uL (ref 0.0–0.5)
Eosinophils Relative: 0 %
HCT: 43.7 % (ref 36.0–46.0)
Hemoglobin: 14.9 g/dL (ref 12.0–15.0)
Immature Granulocytes: 1 %
Lymphocytes Relative: 4 %
Lymphs Abs: 0.9 K/uL (ref 0.7–4.0)
MCH: 31.6 pg (ref 26.0–34.0)
MCHC: 34.1 g/dL (ref 30.0–36.0)
MCV: 92.6 fL (ref 80.0–100.0)
Monocytes Absolute: 1.1 K/uL — ABNORMAL HIGH (ref 0.1–1.0)
Monocytes Relative: 5 %
Neutro Abs: 19.6 K/uL — ABNORMAL HIGH (ref 1.7–7.7)
Neutrophils Relative %: 90 %
Platelets: 312 K/uL (ref 150–400)
RBC: 4.72 MIL/uL (ref 3.87–5.11)
RDW: 12.1 % (ref 11.5–15.5)
WBC: 21.8 K/uL — ABNORMAL HIGH (ref 4.0–10.5)
nRBC: 0 % (ref 0.0–0.2)

## 2024-10-31 LAB — BASIC METABOLIC PANEL WITH GFR
Anion gap: 14 (ref 5–15)
BUN: 9 mg/dL (ref 6–20)
CO2: 25 mmol/L (ref 22–32)
Calcium: 8 mg/dL — ABNORMAL LOW (ref 8.9–10.3)
Chloride: 99 mmol/L (ref 98–111)
Creatinine, Ser: 0.77 mg/dL (ref 0.44–1.00)
GFR, Estimated: >60 mL/min
Glucose, Bld: 147 mg/dL — ABNORMAL HIGH (ref 70–99)
Potassium: 3.7 mmol/L (ref 3.5–5.1)
Sodium: 137 mmol/L (ref 135–145)

## 2024-10-31 LAB — C DIFFICILE QUICK SCREEN W PCR REFLEX
C Diff antigen: POSITIVE — AB
C Diff interpretation: DETECTED
C Diff toxin: POSITIVE — AB

## 2024-10-31 LAB — MAGNESIUM: Magnesium: 2 mg/dL (ref 1.7–2.4)

## 2024-10-31 LAB — HIV ANTIBODY (ROUTINE TESTING W REFLEX): HIV Screen 4th Generation wRfx: NONREACTIVE

## 2024-10-31 MED ORDER — HYDROCOD POLI-CHLORPHE POLI ER 10-8 MG/5ML PO SUER: 5.0000 mL | Freq: Two times a day (BID) | ORAL | Status: DC | PRN

## 2024-10-31 MED ORDER — PROMETHAZINE HCL 25 MG PO TABS
12.5000 mg | ORAL_TABLET | Freq: Four times a day (QID) | ORAL | Status: DC | PRN
  Administered 2024-10-31 – 2024-11-05 (×7): 12.5 mg via ORAL
  Filled 2024-10-31 (×6): qty 1

## 2024-10-31 MED ORDER — HYDROCODONE-ACETAMINOPHEN 5-325 MG PO TABS: 2.0000 | ORAL_TABLET | Freq: Four times a day (QID) | ORAL | 0 refills | Status: AC | PRN

## 2024-10-31 MED ORDER — LACTATED RINGERS IV SOLN: INTRAVENOUS | Status: AC

## 2024-10-31 MED ORDER — FIDAXOMICIN 200 MG PO TABS
200.0000 mg | ORAL_TABLET | Freq: Two times a day (BID) | ORAL | Status: DC
  Administered 2024-10-31 – 2024-11-04 (×9): 200 mg via ORAL
  Filled 2024-10-31 (×8): qty 1

## 2024-10-31 MED ORDER — ONDANSETRON HCL 4 MG/2ML IJ SOLN
4.0000 mg | Freq: Once | INTRAMUSCULAR | Status: AC
  Filled 2024-10-31: qty 2

## 2024-10-31 MED ORDER — ONDANSETRON 4 MG PO TBDP: 4.0000 mg | ORAL_TABLET | Freq: Three times a day (TID) | ORAL | 0 refills | Status: AC | PRN

## 2024-10-31 MED ORDER — HYDROCODONE-ACETAMINOPHEN 5-325 MG PO TABS: 2.0000 | ORAL_TABLET | Freq: Four times a day (QID) | ORAL | 0 refills | Status: DC | PRN

## 2024-10-31 MED ORDER — MORPHINE SULFATE (PF) 4 MG/ML IV SOLN
4.0000 mg | Freq: Once | INTRAVENOUS | Status: AC
  Administered 2024-10-31: 4 mg via INTRAVENOUS
  Filled 2024-10-31: qty 1

## 2024-10-31 MED ORDER — ALBUTEROL SULFATE (2.5 MG/3ML) 0.083% IN NEBU: 2.5000 mg | INHALATION_SOLUTION | Freq: Four times a day (QID) | RESPIRATORY_TRACT | Status: DC | PRN

## 2024-10-31 MED ORDER — DM-GUAIFENESIN ER 30-600 MG PO TB12
1.0000 | ORAL_TABLET | Freq: Two times a day (BID) | ORAL | Status: DC
  Administered 2024-10-31 – 2024-11-03 (×7): 1 via ORAL
  Filled 2024-10-31 (×7): qty 1

## 2024-10-31 MED ORDER — ACETAMINOPHEN 325 MG PO TABS
650.0000 mg | ORAL_TABLET | Freq: Four times a day (QID) | ORAL | Status: DC | PRN
  Administered 2024-10-31 – 2024-11-07 (×3): 650 mg via ORAL
  Filled 2024-10-31 (×2): qty 2

## 2024-10-31 MED ORDER — PROCHLORPERAZINE EDISYLATE 10 MG/2ML IJ SOLN
10.0000 mg | Freq: Four times a day (QID) | INTRAMUSCULAR | Status: DC | PRN
  Administered 2024-10-31 – 2024-11-06 (×12): 10 mg via INTRAVENOUS
  Filled 2024-10-31 (×8): qty 2

## 2024-10-31 MED ORDER — SERTRALINE HCL 50 MG PO TABS
50.0000 mg | ORAL_TABLET | Freq: Every day | ORAL | Status: DC
  Administered 2024-10-31 – 2024-11-07 (×8): 50 mg via ORAL
  Filled 2024-10-31 (×2): qty 1
  Filled 2024-10-31: qty 2
  Filled 2024-10-31: qty 1

## 2024-10-31 MED ORDER — KETOROLAC TROMETHAMINE 15 MG/ML IJ SOLN
15.0000 mg | Freq: Four times a day (QID) | INTRAMUSCULAR | Status: AC | PRN
  Administered 2024-10-31 – 2024-11-05 (×11): 15 mg via INTRAVENOUS
  Filled 2024-10-31 (×7): qty 1

## 2024-10-31 MED ORDER — DIAZEPAM 5 MG PO TABS
10.0000 mg | ORAL_TABLET | Freq: Every evening | ORAL | Status: DC | PRN
  Administered 2024-11-02 – 2024-11-07 (×5): 10 mg via ORAL
  Filled 2024-10-31 (×2): qty 2

## 2024-10-31 MED ORDER — ENOXAPARIN SODIUM 40 MG/0.4ML IJ SOSY
40.0000 mg | PREFILLED_SYRINGE | INTRAMUSCULAR | Status: DC
  Administered 2024-10-31 – 2024-11-06 (×7): 40 mg via SUBCUTANEOUS
  Filled 2024-10-31 (×4): qty 0.4

## 2024-10-31 MED ORDER — FIDAXOMICIN 200 MG PO TABS: 200.0000 mg | ORAL_TABLET | Freq: Two times a day (BID) | ORAL | 0 refills | Status: DC

## 2024-10-31 MED ORDER — LEVOTHYROXINE SODIUM 50 MCG PO TABS
50.0000 ug | ORAL_TABLET | Freq: Every day | ORAL | Status: DC
  Administered 2024-11-01 – 2024-11-02 (×2): 50 ug via ORAL
  Filled 2024-10-31 (×2): qty 1

## 2024-10-31 MED ORDER — SACCHAROMYCES BOULARDII 250 MG PO CAPS
250.0000 mg | ORAL_CAPSULE | Freq: Two times a day (BID) | ORAL | Status: DC
  Administered 2024-10-31 – 2024-11-04 (×8): 250 mg via ORAL
  Filled 2024-10-31 (×7): qty 1

## 2024-10-31 MED ORDER — ONDANSETRON HCL 4 MG/2ML IJ SOLN
4.0000 mg | Freq: Once | INTRAMUSCULAR | Status: DC
  Filled 2024-10-31: qty 2

## 2024-10-31 MED ORDER — FENTANYL CITRATE (PF) 50 MCG/ML IJ SOSY
50.0000 ug | PREFILLED_SYRINGE | Freq: Once | INTRAMUSCULAR | Status: DC
  Filled 2024-10-31: qty 1

## 2024-10-31 MED ORDER — ONDANSETRON 4 MG PO TBDP: 4.0000 mg | ORAL_TABLET | Freq: Three times a day (TID) | ORAL | 0 refills | Status: DC | PRN

## 2024-10-31 MED ORDER — ONDANSETRON HCL 4 MG/2ML IJ SOLN
4.0000 mg | Freq: Once | INTRAMUSCULAR | Status: AC
  Administered 2024-10-31: 4 mg via INTRAVENOUS
  Filled 2024-10-31: qty 2

## 2024-10-31 MED ORDER — PROMETHAZINE HCL 25 MG/ML IJ SOLN
INTRAMUSCULAR | Status: AC
  Administered 2024-10-31: 25 mg
  Filled 2024-10-31: qty 1

## 2024-10-31 MED ORDER — OXYCODONE HCL 5 MG PO TABS
5.0000 mg | ORAL_TABLET | ORAL | Status: DC | PRN
  Administered 2024-10-31 – 2024-11-04 (×14): 5 mg via ORAL
  Filled 2024-10-31 (×12): qty 1

## 2024-10-31 MED ORDER — SODIUM CHLORIDE 0.9 % IV BOLUS
1000.0000 mL | Freq: Once | INTRAVENOUS | Status: AC
  Administered 2024-10-31: 1000 mL via INTRAVENOUS

## 2024-10-31 MED ORDER — ACETAMINOPHEN 650 MG RE SUPP: 650.0000 mg | Freq: Four times a day (QID) | RECTAL | Status: DC | PRN

## 2024-10-31 MED ORDER — DICYCLOMINE HCL 20 MG PO TABS
20.0000 mg | ORAL_TABLET | Freq: Three times a day (TID) | ORAL | Status: DC
  Filled 2024-10-31 (×2): qty 1

## 2024-10-31 MED ORDER — PROCHLORPERAZINE EDISYLATE 10 MG/2ML IJ SOLN: 10.0000 mg | Freq: Four times a day (QID) | INTRAMUSCULAR | Status: DC | PRN

## 2024-10-31 MED ORDER — FENTANYL CITRATE (PF) 50 MCG/ML IJ SOSY
50.0000 ug | PREFILLED_SYRINGE | Freq: Once | INTRAMUSCULAR | Status: AC
  Filled 2024-10-31: qty 1

## 2024-10-31 MED ORDER — SODIUM CHLORIDE 0.9 % IV SOLN
25.0000 mg | Freq: Once | INTRAVENOUS | Status: AC
  Administered 2024-10-31: 25 mg via INTRAVENOUS
  Filled 2024-10-31: qty 1

## 2024-10-31 MED ORDER — ONDANSETRON HCL 4 MG/2ML IJ SOLN
INTRAMUSCULAR | Status: AC
  Administered 2024-10-31: 4 mg via INTRAVENOUS
  Filled 2024-10-31: qty 2

## 2024-10-31 MED ORDER — FENTANYL CITRATE (PF) 50 MCG/ML IJ SOSY
PREFILLED_SYRINGE | INTRAMUSCULAR | Status: AC
  Administered 2024-10-31: 50 ug via INTRAVENOUS
  Filled 2024-10-31: qty 1

## 2024-10-31 NOTE — Discharge Instructions (Addendum)
 It was a pleasure meeting with you today.  As we discussed CT imaging today looks very similar to CT imaging done at the end of November.  We will treat this as suspected continued C. difficile pancolitis.  I have sent antibiotics to your pharmacy.  We have sent collected stool for testing.  Follow-up with your GI specialist on Monday for further management.  You also tested positive for RSV with consistent findings for this infection seen on your CT imaging today.  This is a viral upper respiratory infection.  Viral respiratory infections are treated with symptom management with over-the-counter medications.  Read the instructions below on reasons to return to the emergency department and to learn more about your diagnosis.  Use over the counter medications for symptomatic relief as we discussed (musinex as a decongestant, Tylenol  for fever/pain, Motrin /Ibuprofen  for muscle aches). Followup with your primary care doctor in 4 days if your symptoms persist.  You're more than welcome to return to the emergency department if symptoms worsen or become concerning.  Upper Respiratory Infection, Adult  An upper respiratory infection (URI) is also sometimes known as the common cold. Most people improve within 1 week, but symptoms can last up to 2 weeks. A residual cough may last even longer.   URI is most commonly caused by a virus. Viruses are NOT treated with antibiotics. You can easily spread the virus to others by oral contact. This includes kissing, sharing a glass, coughing, or sneezing. Touching your mouth or nose and then touching a surface, which is then touched by another person, can also spread the virus.   TREATMENT  Treatment is directed at relieving symptoms. There is no cure. Antibiotics are not effective, because the infection is caused by a virus, not by bacteria. Treatment may include:  Increased fluid intake. Sports drinks offer valuable electrolytes, sugars, and fluids.  Breathing heated  mist or steam (vaporizer or shower).  Eating chicken soup or other clear broths, and maintaining good nutrition.  Getting plenty of rest.  Using gargles or lozenges for comfort.  Controlling fevers with ibuprofen  or acetaminophen  as directed by your caregiver.  Increasing usage of your inhaler if you have asthma.  Return to work when your temperature has returned to normal.   SEEK MEDICAL CARE IF:  After the first few days, you feel you are getting worse rather than better.  You develop worsening shortness of breath, or brown or red sputum. These may be signs of pneumonia.  You develop yellow or brown nasal discharge or pain in the face, especially when you bend forward. These may be signs of sinusitis.  You develop a fever, swollen neck glands, pain with swallowing, or white areas in the back of your throat. These may be signs of strep throat.

## 2024-10-31 NOTE — Assessment & Plan Note (Signed)
 Continue home levothyroxine .  Appears patient is also on Cytomel, resume pending med history verification.

## 2024-10-31 NOTE — Assessment & Plan Note (Signed)
 Resume home Valium  at bedtime as needed

## 2024-10-31 NOTE — Assessment & Plan Note (Signed)
 Management as above

## 2024-10-31 NOTE — Assessment & Plan Note (Signed)
 Continue home Zoloft , as needed Valium  at bedtime

## 2024-10-31 NOTE — ED Provider Notes (Signed)
 " [place] [facility] AT [facility] Provider Note   CSN: [id number] Arrival date & time: 10/31/24  9050     Patient presents with: Emesis   Lisa Townsend is a 48 y.o. female.  She had colitis back in the beginning of December treated with Augmentin .  She followed up with GI and her symptoms seem to be improving.  Started back with cough nausea vomiting and diarrhea abdominal pain few days ago.  Tried Zofran  without any improvement.  Seen last night, had labs, CAT scan, C. difficile testing.  Fluids and pain medicine, discharged with prescription for pain medicine nausea medicine and Fidazomicin.  She said she began vomiting again within 30 minutes and has been unable to stop.  No fever.  No blood in the vomitus or diarrhea.  Diarrhea continues.  Unable to tolerate any p.o.   The history is provided by the patient.  Emesis Severity:  Severe Timing:  Constant Quality:  Stomach contents Associated symptoms: abdominal pain and diarrhea   Associated symptoms: no fever   Risk factors: no suspect food intake and no travel to endemic areas        Prior to Admission medications  Medication Sig Start Date End Date Taking? Authorizing Provider  AMBULATORY NON FORMULARY MEDICATION Medication Name: Diltiazem 2%/Lidocaine  2% Using your index finger apply a small amount of medication inside the anal opening and to the external anal area twice daily x 6 weeks. 10/10/24   Craig Alan SAUNDERS, PA-C  amoxicillin -clavulanate (AUGMENTIN ) 875-125 MG tablet Take 1 tablet by mouth every 12 (twelve) hours. 10/06/24   Odell Balls, PA-C  diazepam  (VALIUM ) 10 MG tablet TAKE 1/2 TO 1 TABLET(5 TO 10 MG) BY MOUTH AT BEDTIME AS NEEDED FOR SLEEP 02/06/23   Thedora Garnette HERO, MD  dicyclomine  (BENTYL ) 20 MG tablet Take 1 tablet (20 mg total) by mouth 4 (four) times daily -  before meals and at bedtime. 10/09/24   Craig Alan SAUNDERS, PA-C  EPINEPHrine  0.3 mg/0.3 mL IJ SOAJ injection Inject 0.3 mg  into the muscle once as needed for up to 1 dose for anaphylaxis. 06/22/23   Iva Marty Saltness, MD  estradiol  (ESTRACE ) 0.1 MG/GM vaginal cream APPLY 1/2 GRAM TO VULVA NIGHTLY FOR 2 WEEKS THEN DECREASE TO 1/2 GRAM TO VULVA TWO NIGHTS A WEEK 06/12/24   Chrzanowski, Jami B, NP  estradiol -norethindrone  (COMBIPATCH) 0.05-0.14 MG/DAY Place 1 patch onto the skin 2 (two) times a week. 08/15/24   Chrzanowski, Jami B, NP  fidaxomicin  (DIFICID ) 200 MG TABS tablet Take 1 tablet (200 mg total) by mouth 2 (two) times daily for 10 days. 10/31/24 11/10/24  Griselda Almarie, MD  HYDROcodone -acetaminophen  (NORCO/VICODIN) 5-325 MG tablet Take 2 tablets by mouth every 6 (six) hours as needed. 10/31/24   Griselda Almarie, MD  levothyroxine  (SYNTHROID ) 50 MCG tablet Take 50 mcg by mouth daily before breakfast. 09/27/24   [provider]  liothyronine  (CYTOMEL ) 5 MCG tablet Take 5 mcg by mouth 2 (two) times daily.    [provider]  NONFORMULARY OR COMPOUNDED ITEM Compounded Testosterone  PLO GEL 4% Apply pea sized amount to inner thigh qhs 04/30/24   Chrzanowski, Jami B, NP  ondansetron  (ZOFRAN -ODT) 4 MG disintegrating tablet Take 1 tablet (4 mg total) by mouth every 8 (eight) hours as needed. 10/31/24   Griselda Almarie, MD  sertraline  (ZOLOFT ) 50 MG tablet Take 50 mg by mouth daily.    [provider]    Allergies: Claritin [loratadine], Latex, and Lexapro  [  escitalopram ]    Review of Systems  Constitutional:  Negative for fever.  Gastrointestinal:  Positive for abdominal pain, diarrhea and vomiting.    Updated Vital Signs BP 126/82   Pulse 84   Temp 98.3 F (36.8 C) (Oral)   Resp 18   LMP 12/19/2023   SpO2 100%   Physical Exam Vitals and nursing note reviewed.  Constitutional:      General: She is not in acute distress.    Appearance: Normal appearance. She is well-developed.  HENT:     Head: Normocephalic and atraumatic.  Eyes:     Conjunctiva/sclera: Conjunctivae normal.   Cardiovascular:     Rate and Rhythm: Normal rate and regular rhythm.     Heart sounds: No murmur heard. Pulmonary:     Effort: Pulmonary effort is normal. No respiratory distress.     Breath sounds: Normal breath sounds.  Abdominal:     Palpations: Abdomen is soft.     Tenderness: There is no abdominal tenderness. There is no guarding or rebound.  Musculoskeletal:        General: No swelling.     Cervical back: Neck supple.  Skin:    General: Skin is warm and dry.     Capillary Refill: Capillary refill takes less than 2 seconds.  Neurological:     General: No focal deficit present.     Mental Status: She is alert.     (all labs ordered are listed, but only abnormal results are displayed) Labs Reviewed  BASIC METABOLIC PANEL WITH GFR - Abnormal; Notable for the following components:      Result Value   Glucose, Bld 147 (*)    Calcium 8.0 (*)    All other components within normal limits  CBC WITH DIFFERENTIAL/PLATELET - Abnormal; Notable for the following components:   WBC 21.8 (*)    Neutro Abs 19.6 (*)    Monocytes Absolute 1.1 (*)    Abs Immature Granulocytes 0.11 (*)    All other components within normal limits  MAGNESIUM  HIV ANTIBODY (ROUTINE TESTING W REFLEX)    EKG: None  Radiology: CT ABDOMEN PELVIS W CONTRAST Result Date: 10/30/2024 EXAM: CT ABDOMEN AND PELVIS WITH CONTRAST 10/30/2024 09:34:01 PM TECHNIQUE: CT of the abdomen and pelvis was performed with the administration of 100 mL of iohexol  (OMNIPAQUE ) 300 MG/ML solution. Multiplanar reformatted images are provided for review. Automated exposure control, iterative reconstruction, and/or weight-based adjustment of the mA/kV was utilized to reduce the radiation dose to as low as reasonably achievable. COMPARISON: CT evidence dated 10/06/2024. CLINICAL HISTORY: Abdominal pain, acute, nonlocalized. FINDINGS: LOWER CHEST: New bronchiolectasis and associated ground-glass opacities and interstitial thickening in a  wedge-shaped distribution in the posterior right lower lobe. LIVER: The liver is unremarkable. GALLBLADDER AND BILE DUCTS: Gallbladder is unremarkable. No biliary ductal dilatation. SPLEEN: No acute abnormality. PANCREAS: No acute abnormality. ADRENAL GLANDS: No acute abnormality. KIDNEYS, URETERS AND BLADDER: No stones in the kidneys or ureters. No hydronephrosis. No perinephric or periureteral stranding. Urinary bladder is unremarkable. GI AND BOWEL: Diffuse colonic wall thickening with mucosal hyperenhancement greatest about the ascending and transverse colon. The colitis is unchanged from the 10/06/2024. Normal appendix. Stomach demonstrates no acute abnormality. There is no bowel obstruction. PERITONEUM AND RETROPERITONEUM: No ascites. No free air. VASCULATURE: Aorta is normal in caliber. LYMPH NODES: No lymphadenopathy. REPRODUCTIVE ORGANS: No acute abnormality. BONES AND SOFT TISSUES: No acute osseous abnormality. No focal soft tissue abnormality. IMPRESSION: 1. Pancolitis similar to 11 / 30 / 25.  2. Small airway infection / inflammation in the posterior right lower lobe. Electronically signed by: Norman Gatlin MD 10/30/2024 09:42 PM EST RP Workstation: [id number]     Procedures   Medications Ordered in the ED  fidaxomicin  (DIFICID ) tablet 200 mg (200 mg Oral Given 10/31/24 1512)  diazepam  (VALIUM ) tablet 10 mg (has no administration in time range)  sertraline  (ZOLOFT ) tablet 50 mg (50 mg Oral Given 10/31/24 1203)  levothyroxine  (SYNTHROID ) tablet 50 mcg (has no administration in time range)  enoxaparin  (LOVENOX ) injection 40 mg (has no administration in time range)  lactated ringers  infusion ( Intravenous New Bag/Given 10/31/24 1511)  acetaminophen  (TYLENOL ) tablet 650 mg (has no administration in time range)    Or  acetaminophen  (TYLENOL ) suppository 650 mg (has no administration in time range)  oxyCODONE  (Oxy IR/ROXICODONE ) immediate release tablet 5 mg (has no administration in time  range)  ketorolac  (TORADOL ) 15 MG/ML injection 15 mg (15 mg Intravenous Given 10/31/24 1513)  prochlorperazine  (COMPAZINE ) injection 10 mg (10 mg Intravenous Given 10/31/24 1534)  promethazine  (PHENERGAN ) tablet 12.5 mg (has no administration in time range)  dextromethorphan -guaiFENesin  (MUCINEX  DM) 30-600 MG per 12 hr tablet 1 tablet (1 tablet Oral Given 10/31/24 1512)  chlorpheniramine-HYDROcodone  (TUSSIONEX) 10-8 MG/5ML suspension 5 mL (has no administration in time range)  albuterol  (PROVENTIL ) (2.5 MG/3ML) 0.083% nebulizer solution 2.5 mg (has no administration in time range)  saccharomyces boulardii (FLORASTOR) capsule 250 mg (has no administration in time range)  sodium chloride  0.9 % bolus 1,000 mL (0 mLs Intravenous Stopped 10/31/24 1145)  morphine  (PF) 4 MG/ML injection 4 mg (4 mg Intravenous Given 10/31/24 1033)  promethazine  (PHENERGAN ) 25 mg in sodium chloride  0.9 % 50 mL IVPB (0 mg Intravenous Stopped 10/31/24 1100)  promethazine  (PHENERGAN ) 25 MG/ML injection (25 mg  Given 10/31/24 1034)  morphine  (PF) 4 MG/ML injection 4 mg (4 mg Intravenous Given 10/31/24 1311)    Clinical Course as of 10/31/24 1626  Thu Oct 31, 2024  1055 Discussed with Dr. Fausto [facility] hospitalist who will evaluate for admission. [MB]    Clinical Course User Index [MB] Towana Ozell BROCKS, MD                                 Medical Decision Making Amount and/or Complexity of Data Reviewed Labs: ordered.  Risk Prescription drug management. Decision regarding hospitalization.   This patient complains of abdominal pain nausea vomiting diarrhea; this involves an extensive number of treatment Options and is a complaint that carries with it a high risk of complications and morbidity. The differential includes C. difficile, colitis, dehydration, gastritis  I ordered, reviewed and interpreted labs, which included CBC with elevated white count, chemistries with elevated glucose I ordered medication IV  fluids and pain medicine, nausea medicine and reviewed PMP when indicated. Previous records obtained and reviewed including recent ED visits I consulted [facility] hospitalist Dr. Fausto and discussed lab and imaging findings and discussed disposition.  Cardiac monitoring reviewed, sinus rhythm Social determinants considered, no significant barriers Critical Interventions: None  After the interventions stated above, I reevaluated the patient and found patient still having difficulty taking p.o. Admission and further testing considered, she would benefit from mission to the hospital for further management.  She is in agreement with plan.      Final diagnoses:  C. difficile colitis  Intractable nausea and vomiting    ED Discharge Orders     None  Towana Ozell BROCKS, MD 10/31/24 [phone]  "

## 2024-10-31 NOTE — ED Provider Notes (Signed)
 Care assumed at 2300.  Patient with recent C. Difficile treatment here for evaluation of abdominal pain and diarrhea.  Care assumed pending repeat evaluation.  Patient does have improvement in her pain after medications in department, did have vomiting but is able to tolerate p.o. on recheck.  She has already been started on empiric treatment for C. difficile infection.  C. difficile antigen is pending, not available at this facility at this time.  Feel she is hemodynamically stable for outpatient follow-up with return precautions.  No current evidence of sepsis.  Discussed findings of RSV on lab testing.   Lisa Norris, MD 10/31/24 [phone]

## 2024-10-31 NOTE — Progress Notes (Signed)
" °   10/31/24 2124  Assess: MEWS Score  Temp 100.2 F (37.9 C)  BP 91/61  MAP (mmHg) 71  Pulse Rate (!) 108  Resp 19  SpO2 93 %  O2 Device Room Air  Assess: MEWS Score  MEWS Temp 0  MEWS Systolic 1  MEWS Pulse 1  MEWS RR 0  MEWS LOC 0  MEWS Score 2  MEWS Score Color Yellow  Assess: if the MEWS score is Yellow or Red  Were vital signs accurate and taken at a resting state? Yes  Does the patient meet 2 or more of the SIRS criteria? No  MEWS guidelines implemented  Yes, yellow  Treat  MEWS Interventions Considered administering scheduled or prn medications/treatments as ordered  Take Vital Signs  Increase Vital Sign Frequency  Yellow: Q2hr x1, continue Q4hrs until patient remains green for 12hrs  Escalate  MEWS: Escalate Yellow: Discuss with charge nurse and consider notifying provider and/or RRT  Notify: Charge Nurse/RN  Name of Charge Nurse/RN Notified Angelica,RN  Provider Notification  Provider Name/Title J. Daniels,NP  Date Provider Notified 10/31/24  Time Provider Notified 2140  Method of Notification Page  Notification Reason Other (Comment) (yellow mews d/t tachy/low grade temp 100.2)  Provider response No new orders  Date of Provider Response 10/31/24  Time of Provider Response 2141  Assess: SIRS CRITERIA  SIRS Temperature  0  SIRS Respirations  0  SIRS Pulse 1  SIRS WBC 0  SIRS Score Sum  1    "

## 2024-10-31 NOTE — Assessment & Plan Note (Addendum)
 Intractable nausea & vomiting Patient had colitis findings on CT abdomen pelvis on 10/06/2024, was empirically treated with a course of Augmentin .  No stool studies or C. difficile testing was done at that time it appears.  Patient now with similar colitis findings on imaging and positive C. difficile antigen and toxin, significant leukocytosis, abdominal pain nausea vomiting and persistent diarrhea. --Start Dificid  10-day course --Enteric precautions --Continue IV fluids: LR at 100 cc/h --Antiemetics IV PRN or PO if tolerating --Repeat CBC, BMP in AM --Monitor hydration status and electrolytes

## 2024-10-31 NOTE — H&P (Addendum)
 "   Telemedicine History and Physical    Patient: Lisa Townsend FMW:[id number] DOB: [birth year] DOA: 10/31/2024 DOS: the patient was seen and examined on 10/31/2024 PCP: Maude Rocker, PA-C   Referring Provider: Dr. Towana, MD Telemedicine Provider: Burnard Cunning, DO Patient Location: [facility] ED Referring Diagnosis: Intractable Nausea/Vomiting, C diff Patient Name and DOB verified: Lisa Townsend, Jul 13, 1976 Patient consented to Telemedicine Evaluation: Yes RN virtual assistant: Quintin Abernethy, RN Video encounter time and date: 10/31/2024 11:40 AM   Patient coming from: Home  Chief Complaint:  Chief Complaint  Patient presents with   Emesis   HPI: Lisa Townsend is a 48 y.o. female with medical history significant of migraines, anxiety, hypothyroidism who presented to [facility] ED for evaluation of persistent abdominal pain with nausea/vomiting and diarrhea.   Patient had been treated with a course of Augmentin  for colitis at the beginning of December (seen in the ED 11/30).  She reportedly had follow-up with GI and was feeling better after finishing antibiotics.  2 days ago, patient had recurrence of abdominal pain which became severe yesterday morning so she presented again to the ED.  CT scan colitis similar to the study on 11/30.  Patient reports ongoing diarrhea, approximately 20 episodes in the past 2 to 3 days.  Also with nausea vomiting and not tolerating p.o. intake including after IV antiemetics given in the ED.  Patient also has recently developed a productive cough, sore throat and congestion that started 5 to 6 days ago.  She reports some subjective chills but no known fevers.  ED course: Initial vitals--temp 98.3 F, HR 84, RR 18, BP 126/82, SpO2 100% on room air. Labs obtained including BMP and CBC were notable for significant leukocytosis WBC 21.8 neutrophil predominant (up from 13.6 on 12/24), glucose 147, calcium 8.0. Viral PCR was positive for RSV, negative  for COVID and influenza A/B. C. Difficile antigen and toxin positive. Imaging--CT abdomen pelvis with contrast showed pancolitis similar to that seen on 10/06/2024, small airway infection/inflammation in the posterior right lower lobe.  Patient was treated in the ED with IV Phenergan , IV morphine , 1 L bolus NS fluids.  Dificid  was ordered for C. difficile infection but not stocked in the ED pharmacy at [facility]  Due to intractable nausea vomiting despite IV antiemetics and ongoing significant abdominal pain in the ED, patient is being admitted for observation and further management as outlined in detail below.      Review of Systems: As mentioned in the history of present illness. All other systems reviewed and are negative.   Past Medical History:  Diagnosis Date   Allergy    seasonal- allergy shots weekly to [facility]   Anxiety    Chronic constipation    daily to weekly constipation-  she states has a BM 2-3 x a week   Hypothyroidism    Iron deficiency anemia    Migraine    Urticaria    Vitamin D  deficiency    Past Surgical History:  Procedure Laterality Date   BREAST BIOPSY Left 02/03/2014   CESAREAN SECTION N/A 08/04/2021   Procedure: CESAREAN SECTION;  Surgeon: Sudie Lavonia HERO, MD;  Location: [facility] LD ORS;  Service: Obstetrics;  Laterality: N/A;   NOSE SURGERY     Social History:  reports that she has never smoked. She has been exposed to tobacco smoke. She has never used smokeless tobacco. She reports current alcohol use. She reports that she does not use drugs.  Allergies[1]  Family History  Problem Relation Age of Onset   Hypertension Mother    Breast cancer Mother 19   Urolithiasis Father    Heart disease Father    Allergic rhinitis Brother    Breast cancer Maternal Grandmother        dx under 29   Stomach cancer Paternal Grandmother        dx in her 18s   Alcohol abuse Maternal Uncle    Prostate cancer Paternal Uncle        mid 4s   Asthma Neg Hx    Eczema Neg  Hx    Urticaria Neg Hx    Colon cancer Neg Hx    Colon polyps Neg Hx    Esophageal cancer Neg Hx    Rectal cancer Neg Hx     Prior to Admission medications  Medication Sig Start Date End Date Taking? Authorizing Provider  AMBULATORY NON FORMULARY MEDICATION Medication Name: Diltiazem 2%/Lidocaine  2% Using your index finger apply a small amount of medication inside the anal opening and to the external anal area twice daily x 6 weeks. 10/10/24   Craig Alan SAUNDERS, PA-C  amoxicillin -clavulanate (AUGMENTIN ) 875-125 MG tablet Take 1 tablet by mouth every 12 (twelve) hours. 10/06/24   Odell Balls, PA-C  diazepam  (VALIUM ) 10 MG tablet TAKE 1/2 TO 1 TABLET(5 TO 10 MG) BY MOUTH AT BEDTIME AS NEEDED FOR SLEEP 02/06/23   Thedora Garnette HERO, MD  dicyclomine  (BENTYL ) 20 MG tablet Take 1 tablet (20 mg total) by mouth 4 (four) times daily -  before meals and at bedtime. 10/09/24   Craig Alan SAUNDERS, PA-C  EPINEPHrine  0.3 mg/0.3 mL IJ SOAJ injection Inject 0.3 mg into the muscle once as needed for up to 1 dose for anaphylaxis. 06/22/23   Iva Marty Saltness, MD  estradiol  (ESTRACE ) 0.1 MG/GM vaginal cream APPLY 1/2 GRAM TO VULVA NIGHTLY FOR 2 WEEKS THEN DECREASE TO 1/2 GRAM TO VULVA TWO NIGHTS A WEEK 06/12/24   Chrzanowski, Jami B, NP  estradiol -norethindrone  (COMBIPATCH) 0.05-0.14 MG/DAY Place 1 patch onto the skin 2 (two) times a week. 08/15/24   Chrzanowski, Jami B, NP  fidaxomicin  (DIFICID ) 200 MG TABS tablet Take 1 tablet (200 mg total) by mouth 2 (two) times daily for 10 days. 10/31/24 11/10/24  Griselda Norris, MD  HYDROcodone -acetaminophen  (NORCO/VICODIN) 5-325 MG tablet Take 2 tablets by mouth every 6 (six) hours as needed. 10/31/24   Griselda Norris, MD  levothyroxine  (SYNTHROID ) 50 MCG tablet Take 50 mcg by mouth daily before breakfast. 09/27/24   [provider]  liothyronine (CYTOMEL) 5 MCG tablet Take 5 mcg by mouth 2 (two) times daily.    [provider]  NONFORMULARY OR COMPOUNDED  ITEM Compounded Testosterone  PLO GEL 4% Apply pea sized amount to inner thigh qhs 04/30/24   Chrzanowski, Jami B, NP  ondansetron  (ZOFRAN -ODT) 4 MG disintegrating tablet Take 1 tablet (4 mg total) by mouth every 8 (eight) hours as needed. 10/31/24   Griselda Norris, MD  sertraline  (ZOLOFT ) 50 MG tablet Take 50 mg by mouth daily.    [provider]    Physical Exam: Vitals:   10/31/24 0957  BP: 126/82  Pulse: 84  Resp: 18  Temp: 98.3 F (36.8 C)  TempSrc: Oral  SpO2: 100%   Bedside physical exam was performed by RN listed above. Below exam findings are based on their in person physical exam findings and my observations during virtual encounter.  General exam: awake, alert, no acute distress HEENT: voice clear, hearing grossly  normal  Respiratory system: CTAB with mildly diminished bases, no wheezes or rhonchi, normal respiratory effort.  On room air Cardiovascular system: normal S1/S2, RRR, no murmurs, rubs, gallops, no pedal edema.   Gastrointestinal system: Bowel sounds present, abdomen soft and reported diffusely tender on palpation Central nervous system: A&O x 3. no gross focal neurologic deficits, normal speech Skin: RN reports skin dry intact and normal temp Psychiatry: normal mood, congruent affect, judgement and insight appear normal   Data Reviewed:  As reviewed in detail above  Assessment and Plan:  * C. difficile colitis Intractable nausea & vomiting Patient had colitis findings on CT abdomen pelvis on 10/06/2024, was empirically treated with a course of Augmentin .  No stool studies or C. difficile testing was done at that time it appears.  Patient now with similar colitis findings on imaging and positive C. difficile antigen and toxin, significant leukocytosis, abdominal pain nausea vomiting and persistent diarrhea. --Start Dificid  10-day course --Enteric precautions --Continue IV fluids: LR at 100 cc/h --Antiemetics IV PRN or PO if tolerating --Repeat  CBC, BMP in AM --Monitor hydration status and electrolytes  RSV infection Supportive care with Mucinex  twice daily, Tussionex as needed.  As needed albuterol  nebs.  Droplet precautions.  Due to C diff infection and confirmed RSV viral respiratory infection, will avoid further antibiotics at this time.  Monitor clinically and consider adding PNA coverage for secondary bacterial process pending clinical course, given RLL small area of infiltrate on CT which could be inflammation / pneumonitis if she aspirated during vomiting episodes.      Intractable nausea and vomiting Management as above  Primary insomnia Resume home Valium  at bedtime as needed  Generalized anxiety disorder Continue home Zoloft , as needed Valium  at bedtime  Hypothyroidism Continue home levothyroxine .  Appears patient is also on Cytomel , resume pending med history verification.        Advance Care Planning: Code status - full code  Consults: none  Family Communication:   Severity of Illness: The appropriate patient status for this patient is OBSERVATION. Observation status is judged to be reasonable and necessary in order to provide the required intensity of service to ensure the patient's safety. The patient's presenting symptoms, physical exam findings, and initial radiographic and laboratory data in the context of their medical condition is felt to place them at decreased risk for further clinical deterioration. Furthermore, it is anticipated that the patient will be medically stable for discharge from the hospital within 2 midnights of admission.   Author: Burnard DELENA Cunning, DO 10/31/2024 12:44 PM  For on call review [email or web address].      [1]  Allergies Allergen Reactions   Claritin [Loratadine]     shakiness   Latex Itching   Lexapro  [Escitalopram ] Nausea Only   "

## 2024-10-31 NOTE — Assessment & Plan Note (Addendum)
 Supportive care with Mucinex  twice daily, Tussionex as needed.  As needed albuterol  nebs.  Droplet precautions.  Due to C diff infection and confirmed RSV viral respiratory infection, will avoid further antibiotics at this time.  Monitor clinically and consider adding PNA coverage for secondary bacterial process pending clinical course, given RLL small area of infiltrate on CT which could be inflammation / pneumonitis if she aspirated during vomiting episodes.

## 2024-10-31 NOTE — Plan of Care (Signed)
 48 year old female admitted with abdominal pain and diarrhea.  Recently tested for C. difficile on 10/10/2024 and started on oral vancomycin  on 10/15/2024 for 10 days which she states she completed. Symptoms recurred on 10/29/2024 with recurrent diarrhea and abdominal pain.  She tells me this is her second episode of C. difficile in her lifetime.  C. difficile testing was positive prior to admission (antigen positive, toxin positive). Respiratory testing also positive for RSV.  She was started on fidaxomicin  for Cdiff treatment and supportive care for RSV.  Sleepy on evaluation but had just received pain medication and nausea medication prior to that. Has some generalized abdominal discomfort but nothing severe.  Lung exam negative for any wheezing nor significant rhonchi.  CT A/P performed 12/24 showed similar pancolitis as seen on prior CT from 10/06/2024. No CXR has been obtained thus far.  Will do so now.  No change in treatment regimen as ordered on admission.  Will add Florastor however.  Alm Apo, MD [facility] 10/31/2024, 3:59 PM

## 2024-10-31 NOTE — ED Triage Notes (Signed)
 Pt states that she left here this morning around 0300; and has been vomiting since.   Also endorses abdominal pain.

## 2024-11-01 ENCOUNTER — Other Ambulatory Visit (HOSPITAL_COMMUNITY): Payer: Self-pay

## 2024-11-01 DIAGNOSIS — R112 Nausea with vomiting, unspecified: Secondary | ICD-10-CM | POA: Diagnosis not present

## 2024-11-01 DIAGNOSIS — K5100 Ulcerative (chronic) pancolitis without complications: Secondary | ICD-10-CM | POA: Diagnosis not present

## 2024-11-01 DIAGNOSIS — B974 Respiratory syncytial virus as the cause of diseases classified elsewhere: Secondary | ICD-10-CM | POA: Diagnosis not present

## 2024-11-01 DIAGNOSIS — A0472 Enterocolitis due to Clostridium difficile, not specified as recurrent: Secondary | ICD-10-CM | POA: Diagnosis present

## 2024-11-01 DIAGNOSIS — J210 Acute bronchiolitis due to respiratory syncytial virus: Secondary | ICD-10-CM | POA: Diagnosis present

## 2024-11-01 DIAGNOSIS — R109 Unspecified abdominal pain: Secondary | ICD-10-CM | POA: Diagnosis present

## 2024-11-01 DIAGNOSIS — D72829 Elevated white blood cell count, unspecified: Secondary | ICD-10-CM | POA: Diagnosis not present

## 2024-11-01 DIAGNOSIS — Z800 Family history of malignant neoplasm of digestive organs: Secondary | ICD-10-CM | POA: Diagnosis not present

## 2024-11-01 DIAGNOSIS — G43709 Chronic migraine without aura, not intractable, without status migrainosus: Secondary | ICD-10-CM | POA: Diagnosis present

## 2024-11-01 DIAGNOSIS — Z79899 Other long term (current) drug therapy: Secondary | ICD-10-CM | POA: Diagnosis not present

## 2024-11-01 DIAGNOSIS — F32A Depression, unspecified: Secondary | ICD-10-CM | POA: Diagnosis present

## 2024-11-01 DIAGNOSIS — E039 Hypothyroidism, unspecified: Secondary | ICD-10-CM | POA: Diagnosis present

## 2024-11-01 DIAGNOSIS — Z803 Family history of malignant neoplasm of breast: Secondary | ICD-10-CM | POA: Diagnosis not present

## 2024-11-01 DIAGNOSIS — Z1152 Encounter for screening for COVID-19: Secondary | ICD-10-CM | POA: Diagnosis not present

## 2024-11-01 DIAGNOSIS — J90 Pleural effusion, not elsewhere classified: Secondary | ICD-10-CM | POA: Diagnosis not present

## 2024-11-01 DIAGNOSIS — F5101 Primary insomnia: Secondary | ICD-10-CM | POA: Diagnosis present

## 2024-11-01 DIAGNOSIS — A0471 Enterocolitis due to Clostridium difficile, recurrent: Secondary | ICD-10-CM | POA: Diagnosis not present

## 2024-11-01 DIAGNOSIS — Z888 Allergy status to other drugs, medicaments and biological substances status: Secondary | ICD-10-CM | POA: Diagnosis not present

## 2024-11-01 DIAGNOSIS — K567 Ileus, unspecified: Secondary | ICD-10-CM | POA: Diagnosis not present

## 2024-11-01 DIAGNOSIS — G4700 Insomnia, unspecified: Secondary | ICD-10-CM | POA: Diagnosis present

## 2024-11-01 DIAGNOSIS — Z79890 Hormone replacement therapy (postmenopausal): Secondary | ICD-10-CM | POA: Diagnosis not present

## 2024-11-01 DIAGNOSIS — Z8249 Family history of ischemic heart disease and other diseases of the circulatory system: Secondary | ICD-10-CM | POA: Diagnosis not present

## 2024-11-01 DIAGNOSIS — Z91040 Latex allergy status: Secondary | ICD-10-CM | POA: Diagnosis not present

## 2024-11-01 DIAGNOSIS — F411 Generalized anxiety disorder: Secondary | ICD-10-CM | POA: Diagnosis present

## 2024-11-01 DIAGNOSIS — E876 Hypokalemia: Secondary | ICD-10-CM | POA: Diagnosis present

## 2024-11-01 LAB — BASIC METABOLIC PANEL WITH GFR
Anion gap: 9 (ref 5–15)
BUN: 11 mg/dL (ref 6–20)
CO2: 22 mmol/L (ref 22–32)
Calcium: 6.7 mg/dL — ABNORMAL LOW (ref 8.9–10.3)
Chloride: 103 mmol/L (ref 98–111)
Creatinine, Ser: 0.66 mg/dL (ref 0.44–1.00)
GFR, Estimated: >60 mL/min
Glucose, Bld: 123 mg/dL — ABNORMAL HIGH (ref 70–99)
Potassium: 3.4 mmol/L — ABNORMAL LOW (ref 3.5–5.1)
Sodium: 134 mmol/L — ABNORMAL LOW (ref 135–145)

## 2024-11-01 LAB — CBC
HCT: 43.5 % (ref 36.0–46.0)
Hemoglobin: 14.8 g/dL (ref 12.0–15.0)
MCH: 31.8 pg (ref 26.0–34.0)
MCHC: 34 g/dL (ref 30.0–36.0)
MCV: 93.5 fL (ref 80.0–100.0)
Platelets: 289 K/uL (ref 150–400)
RBC: 4.65 MIL/uL (ref 3.87–5.11)
RDW: 12.4 % (ref 11.5–15.5)
WBC: 21.9 K/uL — ABNORMAL HIGH (ref 4.0–10.5)
nRBC: 0 % (ref 0.0–0.2)

## 2024-11-01 LAB — MAGNESIUM: Magnesium: 1.9 mg/dL (ref 1.7–2.4)

## 2024-11-01 MED ORDER — POTASSIUM CHLORIDE CRYS ER 20 MEQ PO TBCR
40.0000 meq | EXTENDED_RELEASE_TABLET | Freq: Once | ORAL | Status: AC
  Administered 2024-11-01: 40 meq via ORAL
  Filled 2024-11-01: qty 2

## 2024-11-01 NOTE — Progress Notes (Signed)
 " PROGRESS NOTE    Lisa Townsend  FMW:[id number] DOB: 21-Jul-1976 DOA: 10/31/2024 PCP: Maude Rocker, PA-C     Brief Narrative:  Lisa Townsend is a 48 y.o. female with medical history significant of migraines, anxiety, hypothyroidism who presented to [facility] ED for evaluation of persistent abdominal pain with nausea/vomiting and diarrhea.   Patient had been treated with a course of Augmentin  for colitis at the beginning of December (seen in the ED 11/30).  She reportedly had follow-up with GI and was feeling better after finishing antibiotics.  2 days ago, patient had recurrence of abdominal pain which became severe yesterday morning so she presented again to the ED.  CT scan colitis similar to the study on 11/30.  Patient reports ongoing diarrhea, approximately 20 episodes in the past 2 to 3 days. Patient also has recently developed a productive cough, sore throat and congestion that started 5 to 6 days ago.   Viral PCR was positive for RSV, negative for COVID and influenza A/B. C. Difficile antigen and toxin positive. Imaging--CT abdomen pelvis with contrast showed pancolitis similar to that seen on 10/06/2024, small airway infection/inflammation in the posterior right lower lobe.  Patient started on Dificid  and admitted to the hospital.  New events last 24 hours / Subjective: Continues to have abdominal pain.  Continues to have loose watery bowel movements  Assessment & Plan:   Principal Problem:   C. difficile colitis Active Problems:   Intractable nausea and vomiting   RSV infection   Hypothyroidism   Chronic migraine without aura without status migrainosus, not intractable   Generalized anxiety disorder   Primary insomnia  C. difficile colitis - Recently finished course of Augmentin  at the end of November for colitis.  She subsequently tested positive for C. difficile December 9 and was started on oral vancomycin  for 10 days.  She states that her symptoms improved and  she completed her treatment on December 19, however her symptoms did returned - Started on Dificid  12/25 - Supportive care, IV fluid  RSV - Supportive care  Insomnia - Valium  as needed  Generalized anxiety disorder - Zoloft   Hypothyroidism - Levothyroxine   Hypokalemia - Replace  DVT prophylaxis:  enoxaparin  (LOVENOX ) injection 40 mg Start: 10/31/24 2200  Code Status: Full code Family Communication: None Disposition Plan: Home Status is: Observation The patient will require care spanning > 2 midnights and should be moved to inpatient because: Continue IV fluid    Antimicrobials:  Anti-infectives (From admission, onward)    Start     Dose/Rate Route Frequency Ordered Stop   10/31/24 1415  fidaxomicin  (DIFICID ) tablet 200 mg        200 mg Oral 2 times daily 10/31/24 1021 11/10/24 0959        Objective: Vitals:   10/31/24 2355 11/01/24 0041 11/01/24 0135 11/01/24 0521  BP: 98/61 97/60 109/69 123/71  Pulse: 71 91 90 85  Resp: 16 18 18 17   Temp: 99.1 F (37.3 C) 99.9 F (37.7 C) 97.7 F (36.5 C) 98.1 F (36.7 C)  TempSrc: Oral Oral Oral Oral  SpO2: 95% 94% 95% 96%    Intake/Output Summary (Last 24 hours) at 11/01/2024 1049 Last data filed at 11/01/2024 0600 Gross per 24 hour  Intake 1688.75 ml  Output 0 ml  Net 1688.75 ml   There were no vitals filed for this visit.  Examination:  General exam: Appears calm and comfortable  Respiratory system: Clear to auscultation. Respiratory effort normal. No respiratory distress. No conversational  dyspnea.  On room air Cardiovascular system: S1 & S2 heard, RRR.  Gastrointestinal system: Abdomen is nondistended, soft, tender to palpation right hemiabdomen Central nervous system: Alert and oriented. No focal neurological deficits. Speech clear.  Extremities: Symmetric in appearance  Skin: No rashes, lesions or ulcers on exposed skin  Psychiatry: Judgement and insight appear normal. Mood & affect appropriate.    Data Reviewed: I have personally reviewed following labs and imaging studies  CBC: Recent Labs  Lab 10/30/24 1543 10/31/24 1042 11/01/24 0428  WBC 13.6* 21.8* 21.9*  NEUTROABS  --  19.6*  --   HGB 13.6 14.9 14.8  HCT 39.8 43.7 43.5  MCV 92.6 92.6 93.5  PLT 305 312 289   Basic Metabolic Panel: Recent Labs  Lab 10/30/24 1543 10/31/24 1042 11/01/24 0428  NA 138 137 134*  K 4.0 3.7 3.4*  CL 100 99 103  CO2 26 25 22   GLUCOSE 120* 147* 123*  BUN 10 9 11   CREATININE 0.75 0.77 0.66  CALCIUM 8.4* 8.0* 6.7*  MG  --  2.0 1.9   GFR: Estimated Creatinine Clearance: 71.4 mL/min (by C-G formula based on SCr of 0.66 mg/dL). Liver Function Tests: Recent Labs  Lab 10/30/24 1543  AST 42*  ALT 81*  ALKPHOS 100  BILITOT 0.3  PROT 6.4*  ALBUMIN 4.0   Recent Labs  Lab 10/30/24 1543  LIPASE 18   No results for input(s): AMMONIA in the last 168 hours. Coagulation Profile: No results for input(s): INR, PROTIME in the last 168 hours. Cardiac Enzymes: No results for input(s): CKTOTAL, CKMB, CKMBINDEX, TROPONINI in the last 168 hours. BNP (last 3 results) No results for input(s): PROBNP in the last 8760 hours. HbA1C: No results for input(s): HGBA1C in the last 72 hours. CBG: No results for input(s): GLUCAP in the last 168 hours. Lipid Profile: No results for input(s): CHOL, HDL, LDLCALC, TRIG, CHOLHDL, LDLDIRECT in the last 72 hours. Thyroid  Function Tests: No results for input(s): TSH, T4TOTAL, FREET4, T3FREE, THYROIDAB in the last 72 hours. Anemia Panel: No results for input(s): VITAMINB12, FOLATE, FERRITIN, TIBC, IRON, RETICCTPCT in the last 72 hours. Sepsis Labs: No results for input(s): PROCALCITON, LATICACIDVEN in the last 168 hours.  Recent Results (from the past 240 hours)  Resp panel by RT-PCR (RSV, Flu A&B, Covid) Anterior Nasal Swab     Status: Abnormal   Collection Time: 10/30/24  8:49 PM   Specimen:  Anterior Nasal Swab  Result Value Ref Range Status   SARS Coronavirus 2 by RT PCR NEGATIVE NEGATIVE Final    Comment: (NOTE) SARS-CoV-2 target nucleic acids are NOT DETECTED.  The SARS-CoV-2 RNA is generally detectable in upper respiratory specimens during the acute phase of infection. The lowest concentration of SARS-CoV-2 viral copies this assay can detect is 138 copies/mL. A negative result does not preclude SARS-Cov-2 infection and should not be used as the sole basis for treatment or other patient management decisions. A negative result may occur with  improper specimen collection/handling, submission of specimen other than nasopharyngeal swab, presence of viral mutation(s) within the areas targeted by this assay, and inadequate number of viral copies(<138 copies/mL). A negative result must be combined with clinical observations, patient history, and epidemiological information. The expected result is Negative.  Fact Sheet for Patients:  bloggercourse.com  Fact Sheet for Healthcare Providers:  seriousbroker.it  This test is no t yet approved or cleared by the [place]  FDA and  has been authorized for detection and/or diagnosis of SARS-CoV-2 by  FDA under an Emergency Use Authorization (EUA). This EUA will remain  in effect (meaning this test can be used) for the duration of the COVID-19 declaration under Section 564(b)(1) of the Act, 21 U.S.C.section 360bbb-3(b)(1), unless the authorization is terminated  or revoked sooner.       Influenza A by PCR NEGATIVE NEGATIVE Final   Influenza B by PCR NEGATIVE NEGATIVE Final    Comment: (NOTE) The Xpert Xpress SARS-CoV-2/FLU/RSV plus assay is intended as an aid in the diagnosis of influenza from Nasopharyngeal swab specimens and should not be used as a sole basis for treatment. Nasal washings and aspirates are unacceptable for Xpert Xpress SARS-CoV-2/FLU/RSV testing.  Fact  Sheet for Patients: bloggercourse.com  Fact Sheet for Healthcare Providers: seriousbroker.it  This test is not yet approved or cleared by the [place]  FDA and has been authorized for detection and/or diagnosis of SARS-CoV-2 by FDA under an Emergency Use Authorization (EUA). This EUA will remain in effect (meaning this test can be used) for the duration of the COVID-19 declaration under Section 564(b)(1) of the Act, 21 U.S.C. section 360bbb-3(b)(1), unless the authorization is terminated or revoked.     Resp Syncytial Virus by PCR POSITIVE (A) NEGATIVE Final    Comment: (NOTE) Fact Sheet for Patients: bloggercourse.com  Fact Sheet for Healthcare Providers: seriousbroker.it  This test is not yet approved or cleared by the [place]  FDA and has been authorized for detection and/or diagnosis of SARS-CoV-2 by FDA under an Emergency Use Authorization (EUA). This EUA will remain in effect (meaning this test can be used) for the duration of the COVID-19 declaration under Section 564(b)(1) of the Act, 21 U.S.C. section 360bbb-3(b)(1), unless the authorization is terminated or revoked.  Performed at [facility], [address] [place], [place] [address]   C Difficile Quick Screen w PCR reflex     Status: Abnormal   Collection Time: 10/30/24 11:10 PM   Specimen: STOOL  Result Value Ref Range Status   C Diff antigen POSITIVE (A) NEGATIVE Final   C Diff toxin POSITIVE (A) NEGATIVE Final   C Diff interpretation Toxin producing C. difficile detected.  Final    Comment: CRITICAL RESULT CALLED TO, READ BACK BY AND VERIFIED WITH: RN S.GOUGE AT 9066 ON 10/31/2024 B T.SAAD. Performed at [facility], [address] [place], [place] [address]       Radiology Studies: DG CHEST PORT 1 VIEW Result Date: 10/31/2024 EXAM: 1 VIEW(S) XRAY OF THE CHEST 10/31/2024  04:18:00 PM COMPARISON: 08/05/2014 CLINICAL HISTORY: RSV (acute bronchiolitis due to respiratory syncytial virus) FINDINGS: LUNGS AND PLEURA: 1.3 cm nodular opacity in right upper lung zone, unchanged, benign. No pleural effusion. No pneumothorax. HEART AND MEDIASTINUM: No acute abnormality of the cardiac and mediastinal silhouettes. BONES AND SOFT TISSUES: No acute osseous abnormality. IMPRESSION: 1. No acute cardiopulmonary abnormality. Electronically signed by: Rogelia Myers MD 10/31/2024 07:29 PM EST RP Workstation: [id number]   CT ABDOMEN PELVIS W CONTRAST Result Date: 10/30/2024 EXAM: CT ABDOMEN AND PELVIS WITH CONTRAST 10/30/2024 09:34:01 PM TECHNIQUE: CT of the abdomen and pelvis was performed with the administration of 100 mL of iohexol  (OMNIPAQUE ) 300 MG/ML solution. Multiplanar reformatted images are provided for review. Automated exposure control, iterative reconstruction, and/or weight-based adjustment of the mA/kV was utilized to reduce the radiation dose to as low as reasonably achievable. COMPARISON: CT evidence dated 10/06/2024. CLINICAL HISTORY: Abdominal pain, acute, nonlocalized. FINDINGS: LOWER CHEST: New bronchiolectasis and associated ground-glass opacities and interstitial thickening in  a wedge-shaped distribution in the posterior right lower lobe. LIVER: The liver is unremarkable. GALLBLADDER AND BILE DUCTS: Gallbladder is unremarkable. No biliary ductal dilatation. SPLEEN: No acute abnormality. PANCREAS: No acute abnormality. ADRENAL GLANDS: No acute abnormality. KIDNEYS, URETERS AND BLADDER: No stones in the kidneys or ureters. No hydronephrosis. No perinephric or periureteral stranding. Urinary bladder is unremarkable. GI AND BOWEL: Diffuse colonic wall thickening with mucosal hyperenhancement greatest about the ascending and transverse colon. The colitis is unchanged from the 10/06/2024. Normal appendix. Stomach demonstrates no acute abnormality. There is no bowel obstruction.  PERITONEUM AND RETROPERITONEUM: No ascites. No free air. VASCULATURE: Aorta is normal in caliber. LYMPH NODES: No lymphadenopathy. REPRODUCTIVE ORGANS: No acute abnormality. BONES AND SOFT TISSUES: No acute osseous abnormality. No focal soft tissue abnormality. IMPRESSION: 1. Pancolitis similar to 11 / 30 / 25. 2. Small airway infection / inflammation in the posterior right lower lobe. Electronically signed by: Norman Gatlin MD 10/30/2024 09:42 PM EST RP Workstation: [id number]      Scheduled Meds:  dextromethorphan -guaiFENesin   1 tablet Oral BID   enoxaparin  (LOVENOX ) injection  40 mg Subcutaneous Q24H   fidaxomicin   200 mg Oral BID   levothyroxine   50 mcg Oral QAC breakfast   saccharomyces boulardii  250 mg Oral BID   sertraline   50 mg Oral Daily   Continuous Infusions:  lactated ringers  100 mL/hr at 10/31/24 1511     LOS: 0 days   Time spent: 35 minutes   Delon Hoe, DO [facility] 11/01/2024, 10:49 AM   Available via Epic secure chat 7am-7pm After these hours, please refer to coverage provider listed on [email or web address]  "

## 2024-11-02 DIAGNOSIS — A0472 Enterocolitis due to Clostridium difficile, not specified as recurrent: Secondary | ICD-10-CM | POA: Diagnosis not present

## 2024-11-02 LAB — CBC
HCT: 42 % (ref 36.0–46.0)
Hemoglobin: 14.1 g/dL (ref 12.0–15.0)
MCH: 31.6 pg (ref 26.0–34.0)
MCHC: 33.6 g/dL (ref 30.0–36.0)
MCV: 94.2 fL (ref 80.0–100.0)
Platelets: 261 K/uL (ref 150–400)
RBC: 4.46 MIL/uL (ref 3.87–5.11)
RDW: 12.3 % (ref 11.5–15.5)
WBC: 17.7 K/uL — ABNORMAL HIGH (ref 4.0–10.5)
nRBC: 0 % (ref 0.0–0.2)

## 2024-11-02 LAB — MAGNESIUM: Magnesium: 2.3 mg/dL (ref 1.7–2.4)

## 2024-11-02 LAB — BASIC METABOLIC PANEL WITH GFR
Anion gap: 15 (ref 5–15)
BUN: 14 mg/dL (ref 6–20)
CO2: 19 mmol/L — ABNORMAL LOW (ref 22–32)
Calcium: 7.5 mg/dL — ABNORMAL LOW (ref 8.9–10.3)
Chloride: 102 mmol/L (ref 98–111)
Creatinine, Ser: 0.75 mg/dL (ref 0.44–1.00)
GFR, Estimated: >60 mL/min
Glucose, Bld: 103 mg/dL — ABNORMAL HIGH (ref 70–99)
Potassium: 3.8 mmol/L (ref 3.5–5.1)
Sodium: 136 mmol/L (ref 135–145)

## 2024-11-02 MED ORDER — SODIUM CHLORIDE 0.9 % IV SOLN: INTRAVENOUS | Status: DC

## 2024-11-02 MED ORDER — LIOTHYRONINE SODIUM 5 MCG PO TABS
5.0000 ug | ORAL_TABLET | Freq: Two times a day (BID) | ORAL | Status: DC
  Administered 2024-11-02 – 2024-11-07 (×11): 5 ug via ORAL
  Filled 2024-11-02 (×4): qty 1

## 2024-11-02 NOTE — Progress Notes (Signed)
 " PROGRESS NOTE    Lisa Townsend  FMW:[id number] DOB: [birth year] DOA: 10/31/2024 PCP: Maude Rocker, PA-C     Brief Narrative:  Lisa Townsend is a 48 y.o. female with medical history significant of migraines, anxiety, hypothyroidism who presented to [facility] ED for evaluation of persistent abdominal pain with nausea/vomiting and diarrhea.   Patient had been treated with a course of Augmentin  for colitis at the beginning of December (seen in the ED 11/30).  She reportedly had follow-up with GI and was feeling better after finishing antibiotics.  2 days ago, patient had recurrence of abdominal pain which became severe yesterday morning so she presented again to the ED.  CT scan colitis similar to the study on 11/30.  Patient reports ongoing diarrhea, approximately 20 episodes in the past 2 to 3 days. Patient also has recently developed a productive cough, sore throat and congestion that started 5 to 6 days ago.   Viral PCR was positive for RSV, negative for COVID and influenza A/B. C. Difficile antigen and toxin positive. Imaging--CT abdomen pelvis with contrast showed pancolitis similar to that seen on 10/06/2024, small airway infection/inflammation in the posterior right lower lobe.  Patient started on Dificid  and admitted to the hospital.  New events last 24 hours / Subjective: Feels about the same.  Had about 3 bowel movements yesterday, had 2 so far this morning.  Continues to have some abdominal pain.  Oral intake remains poor, no appetite  Assessment & Plan:   Principal Problem:   C. difficile colitis Active Problems:   Intractable nausea and vomiting   RSV infection   Hypothyroidism   Chronic migraine without aura without status migrainosus, not intractable   Generalized anxiety disorder   Primary insomnia  C. difficile colitis - Recently finished course of Augmentin  at the end of November for colitis.  She subsequently tested positive for C. difficile December 9 and  was started on oral vancomycin  for 10 days.  She states that her symptoms improved and she completed her treatment on December 19, however her symptoms did returned - Started on Dificid  12/25 - Supportive care, IV fluid - WBC improving  RSV - Supportive care  Insomnia - Valium  as needed  Generalized anxiety disorder - Zoloft   Hypothyroidism - Levothyroxine    DVT prophylaxis:  enoxaparin  (LOVENOX ) injection 40 mg Start: 10/31/24 2200  Code Status: Full code Family Communication: None Disposition Plan: Home Status is: Inpatient   Antimicrobials:  Anti-infectives (From admission, onward)    Start     Dose/Rate Route Frequency Ordered Stop   10/31/24 1415  fidaxomicin  (DIFICID ) tablet 200 mg        200 mg Oral 2 times daily 10/31/24 1021 11/10/24 0959        Objective: Vitals:   11/01/24 1119 11/01/24 1517 11/01/24 2224 11/02/24 0539  BP: 118/74 109/66 107/68 114/72  Pulse: 90 (!) 101  85  Resp: 18 18 18 18   Temp: 98.2 F (36.8 C) 98.3 F (36.8 C) 99.2 F (37.3 C) 98.5 F (36.9 C)  TempSrc: Oral Oral Oral Oral  SpO2: 100% 93% 94% 94%    Intake/Output Summary (Last 24 hours) at 11/02/2024 1103 Last data filed at 11/02/2024 0540 Gross per 24 hour  Intake 720 ml  Output 0 ml  Net 720 ml   There were no vitals filed for this visit.  Examination:  General exam: Appears calm and comfortable  Respiratory system: Respiratory effort normal. No respiratory distress. No conversational dyspnea.  On room  air Gastrointestinal system: Abdomen is nondistended, soft, some tenderness to palpation lower abdomen n Central nervous system: Alert and oriented. No focal neurological deficits. Speech clear.  Extremities: Symmetric in appearance  Skin: No rashes, lesions or ulcers on exposed skin  Psychiatry: Judgement and insight appear normal. Mood & affect appropriate.   Data Reviewed: I have personally reviewed following labs and imaging studies  CBC: Recent Labs  Lab  10/30/24 1543 10/31/24 1042 11/01/24 0428 11/02/24 0507  WBC 13.6* 21.8* 21.9* 17.7*  NEUTROABS  --  19.6*  --   --   HGB 13.6 14.9 14.8 14.1  HCT 39.8 43.7 43.5 42.0  MCV 92.6 92.6 93.5 94.2  PLT 305 312 289 261   Basic Metabolic Panel: Recent Labs  Lab 10/30/24 1543 10/31/24 1042 11/01/24 0428 11/02/24 0507  NA 138 137 134* 136  K 4.0 3.7 3.4* 3.8  CL 100 99 103 102  CO2 26 25 22  19*  GLUCOSE 120* 147* 123* 103*  BUN 10 9 11 14   CREATININE 0.75 0.77 0.66 0.75  CALCIUM 8.4* 8.0* 6.7* 7.5*  MG  --  2.0 1.9 2.3   GFR: Estimated Creatinine Clearance: 71.4 mL/min (by C-G formula based on SCr of 0.75 mg/dL). Liver Function Tests: Recent Labs  Lab 10/30/24 1543  AST 42*  ALT 81*  ALKPHOS 100  BILITOT 0.3  PROT 6.4*  ALBUMIN 4.0   Recent Labs  Lab 10/30/24 1543  LIPASE 18   No results for input(s): AMMONIA in the last 168 hours. Coagulation Profile: No results for input(s): INR, PROTIME in the last 168 hours. Cardiac Enzymes: No results for input(s): CKTOTAL, CKMB, CKMBINDEX, TROPONINI in the last 168 hours. BNP (last 3 results) No results for input(s): PROBNP in the last 8760 hours. HbA1C: No results for input(s): HGBA1C in the last 72 hours. CBG: No results for input(s): GLUCAP in the last 168 hours. Lipid Profile: No results for input(s): CHOL, HDL, LDLCALC, TRIG, CHOLHDL, LDLDIRECT in the last 72 hours. Thyroid  Function Tests: No results for input(s): TSH, T4TOTAL, FREET4, T3FREE, THYROIDAB in the last 72 hours. Anemia Panel: No results for input(s): VITAMINB12, FOLATE, FERRITIN, TIBC, IRON, RETICCTPCT in the last 72 hours. Sepsis Labs: No results for input(s): PROCALCITON, LATICACIDVEN in the last 168 hours.  Recent Results (from the past 240 hours)  Resp panel by RT-PCR (RSV, Flu A&B, Covid) Anterior Nasal Swab     Status: Abnormal   Collection Time: 10/30/24  8:49 PM   Specimen: Anterior  Nasal Swab  Result Value Ref Range Status   SARS Coronavirus 2 by RT PCR NEGATIVE NEGATIVE Final    Comment: (NOTE) SARS-CoV-2 target nucleic acids are NOT DETECTED.  The SARS-CoV-2 RNA is generally detectable in upper respiratory specimens during the acute phase of infection. The lowest concentration of SARS-CoV-2 viral copies this assay can detect is 138 copies/mL. A negative result does not preclude SARS-Cov-2 infection and should not be used as the sole basis for treatment or other patient management decisions. A negative result may occur with  improper specimen collection/handling, submission of specimen other than nasopharyngeal swab, presence of viral mutation(s) within the areas targeted by this assay, and inadequate number of viral copies(<138 copies/mL). A negative result must be combined with clinical observations, patient history, and epidemiological information. The expected result is Negative.  Fact Sheet for Patients:  bloggercourse.com  Fact Sheet for Healthcare Providers:  seriousbroker.it  This test is no t yet approved or cleared by the [place]  FDA and  has been authorized for detection and/or diagnosis of SARS-CoV-2 by FDA under an Emergency Use Authorization (EUA). This EUA will remain  in effect (meaning this test can be used) for the duration of the COVID-19 declaration under Section 564(b)(1) of the Act, 21 U.S.C.section 360bbb-3(b)(1), unless the authorization is terminated  or revoked sooner.       Influenza A by PCR NEGATIVE NEGATIVE Final   Influenza B by PCR NEGATIVE NEGATIVE Final    Comment: (NOTE) The Xpert Xpress SARS-CoV-2/FLU/RSV plus assay is intended as an aid in the diagnosis of influenza from Nasopharyngeal swab specimens and should not be used as a sole basis for treatment. Nasal washings and aspirates are unacceptable for Xpert Xpress SARS-CoV-2/FLU/RSV testing.  Fact Sheet for  Patients: bloggercourse.com  Fact Sheet for Healthcare Providers: seriousbroker.it  This test is not yet approved or cleared by the [place]  FDA and has been authorized for detection and/or diagnosis of SARS-CoV-2 by FDA under an Emergency Use Authorization (EUA). This EUA will remain in effect (meaning this test can be used) for the duration of the COVID-19 declaration under Section 564(b)(1) of the Act, 21 U.S.C. section 360bbb-3(b)(1), unless the authorization is terminated or revoked.     Resp Syncytial Virus by PCR POSITIVE (A) NEGATIVE Final    Comment: (NOTE) Fact Sheet for Patients: bloggercourse.com  Fact Sheet for Healthcare Providers: seriousbroker.it  This test is not yet approved or cleared by the [place]  FDA and has been authorized for detection and/or diagnosis of SARS-CoV-2 by FDA under an Emergency Use Authorization (EUA). This EUA will remain in effect (meaning this test can be used) for the duration of the COVID-19 declaration under Section 564(b)(1) of the Act, 21 U.S.C. section 360bbb-3(b)(1), unless the authorization is terminated or revoked.  Performed at [facility], [address] [place], [place] [address]   C Difficile Quick Screen w PCR reflex     Status: Abnormal   Collection Time: 10/30/24 11:10 PM   Specimen: STOOL  Result Value Ref Range Status   C Diff antigen POSITIVE (A) NEGATIVE Final   C Diff toxin POSITIVE (A) NEGATIVE Final   C Diff interpretation Toxin producing C. difficile detected.  Final    Comment: CRITICAL RESULT CALLED TO, READ BACK BY AND VERIFIED WITH: RN S.GOUGE AT 9066 ON 10/31/2024 B T.SAAD. Performed at [facility], [address], [place], [place] [address]       Radiology Studies: DG CHEST PORT 1 VIEW Result Date: 10/31/2024 EXAM: 1 VIEW(S) XRAY OF THE CHEST 10/31/2024 04:18:00 PM  COMPARISON: 08/05/2014 CLINICAL HISTORY: RSV (acute bronchiolitis due to respiratory syncytial virus) FINDINGS: LUNGS AND PLEURA: 1.3 cm nodular opacity in right upper lung zone, unchanged, benign. No pleural effusion. No pneumothorax. HEART AND MEDIASTINUM: No acute abnormality of the cardiac and mediastinal silhouettes. BONES AND SOFT TISSUES: No acute osseous abnormality. IMPRESSION: 1. No acute cardiopulmonary abnormality. Electronically signed by: Rogelia Myers MD 10/31/2024 07:29 PM EST RP Workstation: [id number]      Scheduled Meds:  dextromethorphan -guaiFENesin   1 tablet Oral BID   enoxaparin  (LOVENOX ) injection  40 mg Subcutaneous Q24H   fidaxomicin   200 mg Oral BID   liothyronine   5 mcg Oral BID   saccharomyces boulardii  250 mg Oral BID   sertraline   50 mg Oral Daily   Continuous Infusions:  sodium chloride        LOS: 1 day   Time spent: 25 minutes   Delon Hoe, DO [facility]  11/02/2024, 11:03 AM   Available via Epic secure chat 7am-7pm After these hours, please refer to coverage provider listed on [email or web address]  "

## 2024-11-03 ENCOUNTER — Inpatient Hospital Stay (HOSPITAL_COMMUNITY)

## 2024-11-03 DIAGNOSIS — A0472 Enterocolitis due to Clostridium difficile, not specified as recurrent: Secondary | ICD-10-CM | POA: Diagnosis not present

## 2024-11-03 LAB — BASIC METABOLIC PANEL WITH GFR
Anion gap: 8 (ref 5–15)
BUN: 9 mg/dL (ref 6–20)
CO2: 22 mmol/L (ref 22–32)
Calcium: 7.1 mg/dL — ABNORMAL LOW (ref 8.9–10.3)
Chloride: 102 mmol/L (ref 98–111)
Creatinine, Ser: 0.61 mg/dL (ref 0.44–1.00)
GFR, Estimated: >60 mL/min
Glucose, Bld: 113 mg/dL — ABNORMAL HIGH (ref 70–99)
Potassium: 3.5 mmol/L (ref 3.5–5.1)
Sodium: 132 mmol/L — ABNORMAL LOW (ref 135–145)

## 2024-11-03 LAB — CBC
HCT: 37.4 % (ref 36.0–46.0)
Hemoglobin: 12.6 g/dL (ref 12.0–15.0)
MCH: 31.4 pg (ref 26.0–34.0)
MCHC: 33.7 g/dL (ref 30.0–36.0)
MCV: 93.3 fL (ref 80.0–100.0)
Platelets: 262 K/uL (ref 150–400)
RBC: 4.01 MIL/uL (ref 3.87–5.11)
RDW: 12.2 % (ref 11.5–15.5)
WBC: 19.4 K/uL — ABNORMAL HIGH (ref 4.0–10.5)
nRBC: 0 % (ref 0.0–0.2)

## 2024-11-03 NOTE — Plan of Care (Signed)
  Problem: Activity: Goal: Risk for activity intolerance will decrease Outcome: Progressing   Problem: Coping: Goal: Level of anxiety will decrease Outcome: Progressing   Problem: Pain Managment: Goal: General experience of comfort will improve and/or be controlled Outcome: Progressing   Problem: Safety: Goal: Ability to remain free from injury will improve Outcome: Progressing

## 2024-11-03 NOTE — Progress Notes (Signed)
 " PROGRESS NOTE    Lisa Townsend  FMW:[id number] DOB: 08/24/1976 DOA: 10/31/2024 PCP: Maude Rocker, PA-C     Brief Narrative:  Lisa Townsend is a 48 y.o. female with medical history significant of migraines, anxiety, hypothyroidism who presented to [facility] ED for evaluation of persistent abdominal pain with nausea/vomiting and diarrhea.   Patient had been treated with a course of Augmentin  for colitis at the beginning of December (seen in the ED 11/30).  She reportedly had follow-up with GI and was feeling better after finishing antibiotics.  2 days ago, patient had recurrence of abdominal pain which became severe yesterday morning so she presented again to the ED.  CT scan colitis similar to the study on 11/30.  Patient reports ongoing diarrhea, approximately 20 episodes in the past 2 to 3 days. Patient also has recently developed a productive cough, sore throat and congestion that started 5 to 6 days ago.   Viral PCR was positive for RSV, negative for COVID and influenza A/B. C. Difficile antigen and toxin positive. Imaging--CT abdomen pelvis with contrast showed pancolitis similar to that seen on 10/06/2024, small airway infection/inflammation in the posterior right lower lobe.  Patient started on Dificid  and admitted to the hospital.  New events last 24 hours / Subjective: Had 3 bowel movements yesterday.  Still no appetite, still not any improved since admission on 12/25.  Feeling bloated today.  Assessment & Plan:   Principal Problem:   C. difficile colitis Active Problems:   Intractable nausea and vomiting   RSV infection   Hypothyroidism   Chronic migraine without aura without status migrainosus, not intractable   Generalized anxiety disorder   Primary insomnia  C. difficile colitis - Recently finished course of Augmentin  at the end of November for colitis.  She subsequently tested positive for C. difficile December 9 and was started on oral vancomycin  for 10 days.   She states that her symptoms improved and she completed her treatment on December 19, however her symptoms did returned - Started on Dificid  12/25 - Supportive care, IV fluid - WBC improved but then bumped up today to 19.4 - Check KUB  RSV - Supportive care  Insomnia - Valium  as needed  Generalized anxiety disorder - Zoloft   Hypothyroidism - Levothyroxine    DVT prophylaxis:  enoxaparin  (LOVENOX ) injection 40 mg Start: 10/31/24 2200  Code Status: Full code Family Communication: None Disposition Plan: Home Status is: Inpatient   Antimicrobials:  Anti-infectives (From admission, onward)    Start     Dose/Rate Route Frequency Ordered Stop   10/31/24 1415  fidaxomicin  (DIFICID ) tablet 200 mg        200 mg Oral 2 times daily 10/31/24 1021 11/10/24 0959        Objective: Vitals:   11/02/24 1409 11/02/24 2139 11/03/24 0524 11/03/24 1037  BP: 112/79 107/69 112/69 113/79  Pulse: 99 93 98 100  Resp: 18 16 16 14   Temp: (!) 97.5 F (36.4 C) 98.6 F (37 C) 99.8 F (37.7 C) 98.1 F (36.7 C)  TempSrc: Oral Oral Oral Oral  SpO2: 95% 99% 94% 95%    Intake/Output Summary (Last 24 hours) at 11/03/2024 1319 Last data filed at 11/03/2024 9390 Gross per 24 hour  Intake 2195.99 ml  Output --  Net 2195.99 ml   There were no vitals filed for this visit.  Examination:  General exam: Appears calm and comfortable  Respiratory system: Respiratory effort normal. No respiratory distress. No conversational dyspnea.  On room air  Gastrointestinal system: Abdomen is nondistended, soft, some tenderness to palpation lower abdomen  Central nervous system: Alert and oriented. No focal neurological deficits. Speech clear.  Extremities: Symmetric in appearance  Skin: No rashes, lesions or ulcers on exposed skin  Psychiatry: Judgement and insight appear normal. Mood & affect appropriate.   Data Reviewed: I have personally reviewed following labs and imaging studies  CBC: Recent Labs   Lab 10/30/24 1543 10/31/24 1042 11/01/24 0428 11/02/24 0507 11/03/24 0517  WBC 13.6* 21.8* 21.9* 17.7* 19.4*  NEUTROABS  --  19.6*  --   --   --   HGB 13.6 14.9 14.8 14.1 12.6  HCT 39.8 43.7 43.5 42.0 37.4  MCV 92.6 92.6 93.5 94.2 93.3  PLT 305 312 289 261 262   Basic Metabolic Panel: Recent Labs  Lab 10/30/24 1543 10/31/24 1042 11/01/24 0428 11/02/24 0507 11/03/24 0517  NA 138 137 134* 136 132*  K 4.0 3.7 3.4* 3.8 3.5  CL 100 99 103 102 102  CO2 26 25 22  19* 22  GLUCOSE 120* 147* 123* 103* 113*  BUN 10 9 11 14 9   CREATININE 0.75 0.77 0.66 0.75 0.61  CALCIUM 8.4* 8.0* 6.7* 7.5* 7.1*  MG  --  2.0 1.9 2.3  --    GFR: Estimated Creatinine Clearance: 71.4 mL/min (by C-G formula based on SCr of 0.61 mg/dL). Liver Function Tests: Recent Labs  Lab 10/30/24 1543  AST 42*  ALT 81*  ALKPHOS 100  BILITOT 0.3  PROT 6.4*  ALBUMIN 4.0   Recent Labs  Lab 10/30/24 1543  LIPASE 18   No results for input(s): AMMONIA in the last 168 hours. Coagulation Profile: No results for input(s): INR, PROTIME in the last 168 hours. Cardiac Enzymes: No results for input(s): CKTOTAL, CKMB, CKMBINDEX, TROPONINI in the last 168 hours. BNP (last 3 results) No results for input(s): PROBNP in the last 8760 hours. HbA1C: No results for input(s): HGBA1C in the last 72 hours. CBG: No results for input(s): GLUCAP in the last 168 hours. Lipid Profile: No results for input(s): CHOL, HDL, LDLCALC, TRIG, CHOLHDL, LDLDIRECT in the last 72 hours. Thyroid  Function Tests: No results for input(s): TSH, T4TOTAL, FREET4, T3FREE, THYROIDAB in the last 72 hours. Anemia Panel: No results for input(s): VITAMINB12, FOLATE, FERRITIN, TIBC, IRON, RETICCTPCT in the last 72 hours. Sepsis Labs: No results for input(s): PROCALCITON, LATICACIDVEN in the last 168 hours.  Recent Results (from the past 240 hours)  Resp panel by RT-PCR (RSV, Flu A&B,  Covid) Anterior Nasal Swab     Status: Abnormal   Collection Time: 10/30/24  8:49 PM   Specimen: Anterior Nasal Swab  Result Value Ref Range Status   SARS Coronavirus 2 by RT PCR NEGATIVE NEGATIVE Final    Comment: (NOTE) SARS-CoV-2 target nucleic acids are NOT DETECTED.  The SARS-CoV-2 RNA is generally detectable in upper respiratory specimens during the acute phase of infection. The lowest concentration of SARS-CoV-2 viral copies this assay can detect is 138 copies/mL. A negative result does not preclude SARS-Cov-2 infection and should not be used as the sole basis for treatment or other patient management decisions. A negative result may occur with  improper specimen collection/handling, submission of specimen other than nasopharyngeal swab, presence of viral mutation(s) within the areas targeted by this assay, and inadequate number of viral copies(<138 copies/mL). A negative result must be combined with clinical observations, patient history, and epidemiological information. The expected result is Negative.  Fact Sheet for Patients:  bloggercourse.com  Fact  Sheet for Healthcare Providers:  seriousbroker.it  This test is no t yet approved or cleared by the [place]  FDA and  has been authorized for detection and/or diagnosis of SARS-CoV-2 by FDA under an Emergency Use Authorization (EUA). This EUA will remain  in effect (meaning this test can be used) for the duration of the COVID-19 declaration under Section 564(b)(1) of the Act, 21 U.S.C.section 360bbb-3(b)(1), unless the authorization is terminated  or revoked sooner.       Influenza A by PCR NEGATIVE NEGATIVE Final   Influenza B by PCR NEGATIVE NEGATIVE Final    Comment: (NOTE) The Xpert Xpress SARS-CoV-2/FLU/RSV plus assay is intended as an aid in the diagnosis of influenza from Nasopharyngeal swab specimens and should not be used as a sole basis for treatment.  Nasal washings and aspirates are unacceptable for Xpert Xpress SARS-CoV-2/FLU/RSV testing.  Fact Sheet for Patients: bloggercourse.com  Fact Sheet for Healthcare Providers: seriousbroker.it  This test is not yet approved or cleared by the [place]  FDA and has been authorized for detection and/or diagnosis of SARS-CoV-2 by FDA under an Emergency Use Authorization (EUA). This EUA will remain in effect (meaning this test can be used) for the duration of the COVID-19 declaration under Section 564(b)(1) of the Act, 21 U.S.C. section 360bbb-3(b)(1), unless the authorization is terminated or revoked.     Resp Syncytial Virus by PCR POSITIVE (A) NEGATIVE Final    Comment: (NOTE) Fact Sheet for Patients: bloggercourse.com  Fact Sheet for Healthcare Providers: seriousbroker.it  This test is not yet approved or cleared by the [place]  FDA and has been authorized for detection and/or diagnosis of SARS-CoV-2 by FDA under an Emergency Use Authorization (EUA). This EUA will remain in effect (meaning this test can be used) for the duration of the COVID-19 declaration under Section 564(b)(1) of the Act, 21 U.S.C. section 360bbb-3(b)(1), unless the authorization is terminated or revoked.  Performed at [facility], [address] [place], [place] [address]   C Difficile Quick Screen w PCR reflex     Status: Abnormal   Collection Time: 10/30/24 11:10 PM   Specimen: STOOL  Result Value Ref Range Status   C Diff antigen POSITIVE (A) NEGATIVE Final   C Diff toxin POSITIVE (A) NEGATIVE Final   C Diff interpretation Toxin producing C. difficile detected.  Final    Comment: CRITICAL RESULT CALLED TO, READ BACK BY AND VERIFIED WITH: RN S.GOUGE AT 9066 ON 10/31/2024 B T.SAAD. Performed at [facility], [address] [place], [place] [address]       Radiology  Studies: No results found.     Scheduled Meds:  dextromethorphan -guaiFENesin   1 tablet Oral BID   enoxaparin  (LOVENOX ) injection  40 mg Subcutaneous Q24H   fidaxomicin   200 mg Oral BID   liothyronine   5 mcg Oral BID   saccharomyces boulardii  250 mg Oral BID   sertraline   50 mg Oral Daily   Continuous Infusions:  sodium chloride  100 mL/hr at 11/03/24 0710     LOS: 2 days   Time spent: 25 minutes   Delon Hoe, DO [facility] 11/03/2024, 1:19 PM   Available via Epic secure chat 7am-7pm After these hours, please refer to coverage provider listed on [email or web address]  "

## 2024-11-04 ENCOUNTER — Ambulatory Visit: Admitting: Physician Assistant

## 2024-11-04 DIAGNOSIS — A0471 Enterocolitis due to Clostridium difficile, recurrent: Secondary | ICD-10-CM | POA: Diagnosis not present

## 2024-11-04 DIAGNOSIS — B974 Respiratory syncytial virus as the cause of diseases classified elsewhere: Secondary | ICD-10-CM | POA: Diagnosis not present

## 2024-11-04 DIAGNOSIS — D72829 Elevated white blood cell count, unspecified: Secondary | ICD-10-CM | POA: Diagnosis not present

## 2024-11-04 DIAGNOSIS — A0472 Enterocolitis due to Clostridium difficile, not specified as recurrent: Secondary | ICD-10-CM | POA: Diagnosis not present

## 2024-11-04 DIAGNOSIS — E876 Hypokalemia: Secondary | ICD-10-CM | POA: Diagnosis not present

## 2024-11-04 DIAGNOSIS — K5100 Ulcerative (chronic) pancolitis without complications: Secondary | ICD-10-CM

## 2024-11-04 DIAGNOSIS — R112 Nausea with vomiting, unspecified: Secondary | ICD-10-CM | POA: Diagnosis not present

## 2024-11-04 DIAGNOSIS — K567 Ileus, unspecified: Secondary | ICD-10-CM | POA: Diagnosis not present

## 2024-11-04 DIAGNOSIS — J90 Pleural effusion, not elsewhere classified: Secondary | ICD-10-CM | POA: Diagnosis not present

## 2024-11-04 LAB — BASIC METABOLIC PANEL WITH GFR
Anion gap: 9 (ref 5–15)
BUN: 7 mg/dL (ref 6–20)
CO2: 18 mmol/L — ABNORMAL LOW (ref 22–32)
Calcium: 6.8 mg/dL — ABNORMAL LOW (ref 8.9–10.3)
Chloride: 104 mmol/L (ref 98–111)
Creatinine, Ser: 0.46 mg/dL (ref 0.44–1.00)
GFR, Estimated: >60 mL/min
Glucose, Bld: 110 mg/dL — ABNORMAL HIGH (ref 70–99)
Potassium: 3.1 mmol/L — ABNORMAL LOW (ref 3.5–5.1)
Sodium: 131 mmol/L — ABNORMAL LOW (ref 135–145)

## 2024-11-04 LAB — CBC
HCT: 35.2 % — ABNORMAL LOW (ref 36.0–46.0)
Hemoglobin: 11.9 g/dL — ABNORMAL LOW (ref 12.0–15.0)
MCH: 31.5 pg (ref 26.0–34.0)
MCHC: 33.8 g/dL (ref 30.0–36.0)
MCV: 93.1 fL (ref 80.0–100.0)
Platelets: 296 K/uL (ref 150–400)
RBC: 3.78 MIL/uL — ABNORMAL LOW (ref 3.87–5.11)
RDW: 12.4 % (ref 11.5–15.5)
WBC: 21.5 K/uL — ABNORMAL HIGH (ref 4.0–10.5)
nRBC: 0 % (ref 0.0–0.2)

## 2024-11-04 MED ORDER — METRONIDAZOLE 500 MG/100ML IV SOLN
500.0000 mg | Freq: Three times a day (TID) | INTRAVENOUS | Status: DC
  Administered 2024-11-04 – 2024-11-06 (×6): 500 mg via INTRAVENOUS
  Filled 2024-11-04 (×6): qty 100

## 2024-11-04 MED ORDER — POTASSIUM CHLORIDE CRYS ER 20 MEQ PO TBCR
40.0000 meq | EXTENDED_RELEASE_TABLET | Freq: Two times a day (BID) | ORAL | Status: AC
  Administered 2024-11-04 (×2): 40 meq via ORAL
  Filled 2024-11-04 (×2): qty 2

## 2024-11-04 MED ORDER — VANCOMYCIN HCL 250 MG PO CAPS
500.0000 mg | ORAL_CAPSULE | Freq: Four times a day (QID) | ORAL | Status: DC
  Administered 2024-11-04 – 2024-11-07 (×12): 500 mg via ORAL
  Filled 2024-11-04 (×12): qty 2

## 2024-11-04 MED ORDER — VANCOMYCIN HCL 500 MG IV SOLR
500.0000 mg | Freq: Four times a day (QID) | Status: DC
  Administered 2024-11-04 – 2024-11-07 (×11): 500 mg via RECTAL
  Filled 2024-11-04 (×12): qty 10
  Filled 2024-11-04: qty 500

## 2024-11-04 NOTE — Consult Note (Addendum)
 "  Referring Provider: Dr. Delon Hoe Primary Care Physician:  Maude Rocker, PA-C Primary Gastroenterologist:  Dr. San   Reason for Consultation: C. Difficile infection  HPI: Lisa Townsend is a 48 y.o. female with a past medical history of anxiety, migraine headaches, hypothyroidism, chronic constipation and was recently diagnosed with C. Difficile colitis.    She was seen by Alan Coombs, PA-C in her outpatient GI clinic 10/10/2024, for hospital follow-up.  Patient was evaluated in the ED 10/06/2024 with epigastric pain and diarrhea.  CTAP with contrast showed nonspecific infectious or inflammatory pancolitis normal terminal ileum.  Treated with a course of Augmentin .  Diatherix C. difficile toxin B gene tested positive 10/10/2024.  Patient was instructed to stop Augmentin .  She was prescribed Vancomycin  125 mg 1 capsule 4 times daily for 10 days and Florastor 1 p.o. twice daily x 4 weeks.  She stated her diarrhea abated after she completed the course of Vancomycin  and then recurred around 10/29/2024.  She presented to the ED 10/30/2024 with nausea, vomiting, abdominal pain and persistent diarrhea.  WBC 13.6.  Hemoglobin 13.6.  CTAP with contrast showed pancolitis similar to CT 10/06/2024. C. difficile antigen and toxin were + 10/30/2024. She was prescribed Dificid  200 mg 1 p.o. twice daily x 10 days.  RSV was positive.  CTA showed small airway infection/inflammation in the posterior right lower lobe.  She was discharged home.  She stated taking her first dose of Dificid  on 12/25.    She continued to vomit with persistent diarrhea and abdominal pain therefore she presented back to the ED 10/31/2024. .  Labs in the ED showed a WBC count of 21.8.  Hemoglobin 14.9.  BUN 9.  Creatinine 0.77.  Sodium 137.  Potassium 3.7.  Magnesium 2.0.  HIV nonreactive.  Chest x-ray was negative.  Abdominal x-ray 11/03/2024 showed few air-filled loops of small bowel in the central abdomen,  nonspecific, possible representing an ileus without obstruction small bowel distention.  A GI consult was requested for further management of C. difficile colitis.  She continues to have nausea and vomited nonbloody emesis x 1 last night.  She is unable to keep any fluid down.  She endorses passing 1 or 2 episodes of diarrhea overnight and 3-4 episodes thus far today.  She saw small amount of blood on the toilet tissue x 1.  She continues to have generalized abdominal pain.  No prior history of C. difficile.  No family history of IBD. She underwent a colonoscopy 02/07/2022 which showed 2 polyps measuring 2 to 4 mm removed from the distal sigmoid transverse colon,  nonbleeding internal hemorrhoids and normal TI.  Path report consistent with benign colonic mucosa, recall colonoscopy recommended in 10 years   Past Medical History:  Diagnosis Date   Allergy    seasonal- allergy shots weekly to [facility]   Anxiety    Chronic constipation    daily to weekly constipation-  she states has a BM 2-3 x a week   Hypothyroidism    Iron deficiency anemia    Migraine    Urticaria    Vitamin D  deficiency     Past Surgical History:  Procedure Laterality Date   BREAST BIOPSY Left 02/03/2014   CESAREAN SECTION N/A 08/04/2021   Procedure: CESAREAN SECTION;  Surgeon: Sudie Lavonia HERO, MD;  Location: [facility] LD ORS;  Service: Obstetrics;  Laterality: N/A;   NOSE SURGERY      Prior to Admission medications  Medication Sig Start Date End Date Taking?  Authorizing Provider  diazepam  (VALIUM ) 10 MG tablet TAKE 1/2 TO 1 TABLET(5 TO 10 MG) BY MOUTH AT BEDTIME AS NEEDED FOR SLEEP 02/06/23  Yes Thedora Garnette HERO, MD  dicyclomine  (BENTYL ) 20 MG tablet Take 1 tablet (20 mg total) by mouth 4 (four) times daily -  before meals and at bedtime. 10/09/24  Yes Craig Palma R, PA-C  estradiol  (ESTRACE ) 0.1 MG/GM vaginal cream APPLY 1/2 GRAM TO VULVA NIGHTLY FOR 2 WEEKS THEN DECREASE TO 1/2 GRAM TO VULVA TWO NIGHTS A WEEK 06/12/24  Yes  Chrzanowski, Jami B, NP  estradiol -norethindrone  (COMBIPATCH) 0.05-0.14 MG/DAY Place 1 patch onto the skin 2 (two) times a week. 08/15/24  Yes Chrzanowski, Jami B, NP  fidaxomicin  (DIFICID ) 200 MG TABS tablet Take 1 tablet (200 mg total) by mouth 2 (two) times daily for 10 days. 10/31/24 11/10/24 Yes Griselda Norris, MD  HYDROcodone -acetaminophen  (NORCO/VICODIN) 5-325 MG tablet Take 2 tablets by mouth every 6 (six) hours as needed. 10/31/24  Yes Griselda Norris, MD  levothyroxine  (SYNTHROID ) 50 MCG tablet Take 50 mcg by mouth daily before breakfast. 09/27/24  Yes [provider]  liothyronine  (CYTOMEL ) 5 MCG tablet Take 5 mcg by mouth 2 (two) times daily.   Yes [provider]  NONFORMULARY OR COMPOUNDED ITEM Compounded Testosterone  PLO GEL 4% Apply pea sized amount to inner thigh qhs 04/30/24  Yes Chrzanowski, Jami B, NP  ondansetron  (ZOFRAN -ODT) 4 MG disintegrating tablet Take 1 tablet (4 mg total) by mouth every 8 (eight) hours as needed. 10/31/24  Yes Griselda Norris, MD  sertraline  (ZOLOFT ) 50 MG tablet Take 50 mg by mouth daily.   Yes [provider]  AMBULATORY NON FORMULARY MEDICATION Medication Name: Diltiazem 2%/Lidocaine  2% Using your index finger apply a small amount of medication inside the anal opening and to the external anal area twice daily x 6 weeks. 10/10/24   Craig Palma SAUNDERS, PA-C  EPINEPHrine  0.3 mg/0.3 mL IJ SOAJ injection Inject 0.3 mg into the muscle once as needed for up to 1 dose for anaphylaxis. 06/22/23   Iva Marty Saltness, MD    Current Facility-Administered Medications  Medication Dose Route Frequency Provider Last Rate Last Admin   0.9 %  sodium chloride  infusion   Intravenous Continuous Rojelio Nest, DO 100 mL/hr at 11/04/24 0243 New Bag at 11/04/24 0243   acetaminophen  (TYLENOL ) tablet 650 mg  650 mg Oral Q6H PRN Fausto Burnard LABOR, DO   650 mg at 11/01/24 2213   Or   acetaminophen  (TYLENOL ) suppository 650 mg  650 mg Rectal Q6H PRN  Fausto Burnard A, DO       albuterol  (PROVENTIL ) (2.5 MG/3ML) 0.083% nebulizer solution 2.5 mg  2.5 mg Nebulization Q6H PRN Fausto Burnard A, DO       diazepam  (VALIUM ) tablet 10 mg  10 mg Oral QHS PRN Fausto Burnard A, DO   10 mg at 11/03/24 2133   enoxaparin  (LOVENOX ) injection 40 mg  40 mg Subcutaneous Q24H Fausto Burnard A, DO   40 mg at 11/03/24 2114   fidaxomicin  (DIFICID ) tablet 200 mg  200 mg Oral BID Butler, Michael C, MD   200 mg at 11/04/24 0954   ketorolac  (TORADOL ) 15 MG/ML injection 15 mg  15 mg Intravenous Q6H PRN Fausto Burnard A, DO   15 mg at 11/04/24 9587   liothyronine  (CYTOMEL ) tablet 5 mcg  5 mcg Oral BID Rojelio Nest, DO   5 mcg at 11/04/24 9045   oxyCODONE  (Oxy IR/ROXICODONE ) immediate release tablet 5 mg  5 mg Oral Q4H PRN Fausto Sor A, DO   5 mg at 11/04/24 9046   potassium chloride  SA (KLOR-CON  M) CR tablet 40 mEq  40 mEq Oral BID Rojelio Nest, DO   40 mEq at 11/04/24 9045   prochlorperazine  (COMPAZINE ) injection 10 mg  10 mg Intravenous Q6H PRN Fausto Sor LABOR, DO   10 mg at 11/04/24 9587   promethazine  (PHENERGAN ) tablet 12.5 mg  12.5 mg Oral Q6H PRN Fausto Sor LABOR, DO   12.5 mg at 11/04/24 9046   saccharomyces boulardii (FLORASTOR) capsule 250 mg  250 mg Oral BID Patsy Lenis, MD   250 mg at 11/04/24 9044   sertraline  (ZOLOFT ) tablet 50 mg  50 mg Oral Daily Fausto Sor A, DO   50 mg at 11/04/24 9044    Allergies as of 10/31/2024 - Review Complete 10/31/2024  Allergen Reaction Noted   Claritin [loratadine]  06/26/2017   Latex Itching 08/04/2021   Lexapro  [escitalopram ] Nausea Only 05/21/2015    Family History  Problem Relation Age of Onset   Hypertension Mother    Breast cancer Mother 14   Urolithiasis Father    Heart disease Father    Allergic rhinitis Brother    Breast cancer Maternal Grandmother        dx under 50   Stomach cancer Paternal Grandmother        dx in her 75s   Alcohol abuse Maternal Uncle    Prostate cancer  Paternal Uncle        mid 69s   Asthma Neg Hx    Eczema Neg Hx    Urticaria Neg Hx    Colon cancer Neg Hx    Colon polyps Neg Hx    Esophageal cancer Neg Hx    Rectal cancer Neg Hx     Social History   Socioeconomic History   Marital status: Married    Spouse name: Richard   Number of children: 1   Years of education: Not on file   Highest education level: Not on file  Occupational History   Not on file  Tobacco Use   Smoking status: Never    Passive exposure: Past   Smokeless tobacco: Never  Vaping Use   Vaping status: Never Used  Substance and Sexual Activity   Alcohol use: Yes    Comment: rare   Drug use: No   Sexual activity: Yes    Partners: Male    Comment: menarche 13yo, sexual debut 48yo  Other Topics Concern   Not on file  Social History Narrative   Not on file   Social Drivers of Health   Tobacco Use: Low Risk (10/31/2024)   Patient History    Smoking Tobacco Use: Never    Smokeless Tobacco Use: Never    Passive Exposure: Past  Financial Resource Strain: Low Risk (06/14/2023)   Received from [facility]   Overall Financial Resource Strain (CARDIA)    Difficulty of Paying Living Expenses: Not hard at all  Food Insecurity: No Food Insecurity (10/31/2024)   Epic    Worried About Programme Researcher, Broadcasting/film/video in the Last Year: Never true    [employer] of Food in the Last Year: Never true  Transportation Needs: No Transportation Needs (10/31/2024)   Epic    Lack of Transportation (Medical): No    Lack of Transportation (Non-Medical): No  Physical Activity: Not on file  Stress: Not on file  Social Connections: Moderately Integrated (10/31/2024)   Social Connection and  Isolation Panel    Frequency of Communication with Friends and Family: More than three times a week    Frequency of Social Gatherings with Friends and Family: More than three times a week    Attends Religious Services: Never    Database Administrator or Organizations: Yes    Attends Museum/gallery Exhibitions Officer: More than 4 times per year    Marital Status: Married  Catering Manager Violence: Not At Risk (10/31/2024)   Epic    Fear of Current or Ex-Partner: No    Emotionally Abused: No    Physically Abused: No    Sexually Abused: No  Depression (PHQ2-9): Low Risk (04/30/2024)   Depression (PHQ2-9)    PHQ-2 Score: 0  Alcohol Screen: Not on file  Housing: Low Risk (10/31/2024)   Epic    Unable to Pay for Housing in the Last Year: No    Number of Times Moved in the Last Year: 0    Homeless in the Last Year: No  Utilities: Not At Risk (10/31/2024)   Epic    Threatened with loss of utilities: No  Health Literacy: Not on file    Review of Systems: Gen: Denies fever, sweats or chills. No weight loss.  CV: Denies chest pain, palpitations or edema. Resp: Denies cough, shortness of breath of hemoptysis.  GI: See HPI. No GERD symptoms.    GU : Denies urinary burning, blood in urine, increased urinary frequency or incontinence. MS: Denies joint pain, muscles aches or weakness. Derm: Denies rash, itchiness, skin lesions or unhealing ulcers. Psych: Denies depression, anxiety, memory loss or confusion. Heme: Denies easy bruising, bleeding. Neuro:  Denies headaches, dizziness or paresthesias. Endo:  Denies any problems with DM, thyroid  or adrenal function.  Physical Exam: Vital signs in last 24 hours: Temp:  [98.1 F (36.7 C)-99.1 F (37.3 C)] 99.1 F (37.3 C) (12/28 1934) Pulse Rate:  [100-107] 107 (12/28 1934) Resp:  [14-19] 19 (12/28 1934) BP: (113-126)/(78-79) 126/78 (12/28 1934) SpO2:  [95 %-96 %] 96 % (12/28 1934) Weight:  [66.7 kg] 66.7 kg (12/29 0956) Last BM Date : 11/04/24 General:  Alert lethargic appearing 48 year old female, conversant. Head:  Normocephalic and atraumatic. Eyes:  No scleral icterus. Conjunctiva pink. Ears:  Normal auditory acuity. Nose:  No deformity, discharge or lesions. Mouth:  Dentition intact. No ulcers or lesions.  Neck:  Supple.  No lymphadenopathy or thyromegaly.  Lungs: Breath sounds clear throughout. No wheezes, rhonchi or crackles.  Heart: Regular rate and rhythm, no murmurs. Abdomen: Soft, nondistended.  Generalized tenderness without rebound or guarding.  Hypoactive bowel sounds to all 4 quadrants. Rectal: Deferred. Musculoskeletal:  Symmetrical without gross deformities.  Pulses:  Normal pulses noted. Extremities:  Without clubbing or edema. Neurologic:  Alert and  oriented x 4. No focal deficits.  Skin:  Intact without significant lesions or rashes. Psych:  Alert and cooperative. Normal mood and affect.  Intake/Output from previous day: 12/28 0701 - 12/29 0700 In: 2528.5 [P.O.:120; I.V.:2408.5] Out: -  Intake/Output this shift: No intake/output data recorded.  Lab Results: Recent Labs    11/02/24 0507 11/03/24 0517 11/04/24 0447  WBC 17.7* 19.4* 21.5*  HGB 14.1 12.6 11.9*  HCT 42.0 37.4 35.2*  PLT 261 262 296   BMET Recent Labs    11/02/24 0507 11/03/24 0517 11/04/24 0447  NA 136 132* 131*  K 3.8 3.5 3.1*  CL 102 102 104  CO2 19* 22 18*  GLUCOSE 103* 113* 110*  BUN 14  9 7  CREATININE 0.75 0.61 0.46  CALCIUM 7.5* 7.1* 6.8*   LFT No results for input(s): PROT, ALBUMIN, AST, ALT, ALKPHOS, BILITOT, BILIDIR, IBILI in the last 72 hours. PT/INR No results for input(s): LABPROT, INR in the last 72 hours. Hepatitis Panel No results for input(s): HEPBSAG, HCVAB, HEPAIGM, HEPBIGM in the last 72 hours.    Studies/Results: DG Abd 1 View Result Date: 11/03/2024 CLINICAL DATA:  Ileus.  Abdominal pain. EXAM: DG ABDOMEN 1V COMPARISON:  CT 10/30/2024 FINDINGS: Few air-filled loops of small bowel in the central abdomen. No abnormal small bowel distension. No significant formed stool in the colon. No evidence of free air on supine views. No radiopaque calculi. IMPRESSION: Few air-filled loops of small bowel in the central abdomen, nonspecific, may represent ileus. No  abnormal small bowel distension. Electronically Signed   By: Andrea Gasman M.D.   On: 11/03/2024 17:16   PRIOR CTAP with contrast 10/06/2024: LOWER CHEST: No acute abnormality.   LIVER: The liver is unremarkable.   GALLBLADDER AND BILE DUCTS: Gallbladder is unremarkable. No biliary ductal dilatation.   SPLEEN: No acute abnormality.   PANCREAS: No acute abnormality.   ADRENAL GLANDS: No acute abnormality.   KIDNEYS, URETERS AND BLADDER: No stones in the kidneys or ureters. No hydronephrosis. No perinephric or periureteral stranding. Urinary bladder is unremarkable.   GI AND BOWEL: Stomach demonstrates no acute abnormality. Diffuse wall thickening and mucosal hyperenhancement throughout the colon and rectum. Normal terminal ileum. Normal appendix. There is no bowel obstruction.   PERITONEUM AND RETROPERITONEUM: No ascites. No free air.   VASCULATURE: Aorta is normal in caliber.   LYMPH NODES: No lymphadenopathy.   REPRODUCTIVE ORGANS: No acute abnormality.   BONES AND SOFT TISSUES: No acute osseous abnormality. No focal soft tissue abnormality.   IMPRESSION: 1. Nonspecific infectious or inflammatory pancolitis.  CTAP with contrast 10/30/2024: LOWER CHEST: New bronchiolectasis and associated ground-glass opacities and interstitial thickening in a wedge-shaped distribution in the posterior right lower lobe.   LIVER: The liver is unremarkable.   GALLBLADDER AND BILE DUCTS: Gallbladder is unremarkable. No biliary ductal dilatation.   SPLEEN: No acute abnormality.   PANCREAS: No acute abnormality.   ADRENAL GLANDS: No acute abnormality.   KIDNEYS, URETERS AND BLADDER: No stones in the kidneys or ureters. No hydronephrosis. No perinephric or periureteral stranding. Urinary bladder is unremarkable.   GI AND BOWEL: Diffuse colonic wall thickening with mucosal hyperenhancement greatest about the ascending and transverse colon. The colitis is unchanged  from the 10/06/2024. Normal appendix. Stomach demonstrates no acute abnormality. There is no bowel obstruction.   PERITONEUM AND RETROPERITONEUM: No ascites. No free air.   VASCULATURE: Aorta is normal in caliber.   LYMPH NODES: No lymphadenopathy.   REPRODUCTIVE ORGANS: No acute abnormality.   BONES AND SOFT TISSUES: No acute osseous abnormality. No focal soft tissue abnormality.   IMPRESSION: 1. Pancolitis similar to 11 / 30 / 25. 2. Small airway infection / inflammation in the posterior right lower lobe.    IMPRESSION/PLAN:  48 year old female previously diagnosed with pancolitis per CTAP 10/06/2024 treated with a course of Augmentin , subsequently developed diarrhea, tested positive for C. difficile 10/10/2024 as an outpatient and prescribed a course of Vancomycin  125 mg 4 times daily for 10 days. Diarrhea initially abated then recurred 10/29/2024. Patient presented to the ED 12/24 with N/V/D and abdominal pain. CTAP showed pancolitis, unchanged when compared to prior CT. She was prescribed Dificid  200 mg p.o. twice daily x 10 days and was discharged  home. She took 1 dose of Dificid  12/25 but was readmitted later the same day with persistent N/V/D and generalized abdominal pain consistent with C. difficile colitis. Dificid  has been continued since admission. Temp 99.22F.  Hemodynamically stable. - Clear liquid diet as tolerated  - IV fluids and pain management per the hospitalist - Continue Compazine  10 mg IV every 6 hours as needed and Phenergan  12.5 mg p.o. every 6 hours as needed - Consider stopping Dificid  and starting high-dose oral Vancomycin  and Flagyl  IV. ID consult requested per the hospitalist, await their recommendations. - Enteric precautions maintained - CBC and CMP in am - Await further recommendations per Dr. San  Possible ileus per abdominal x-ray - Clear liquid diet - Keep k+ > 4 < 5 and magnesium 2 to 2.5  RSV positive 12/24, no respiratory symptoms at  this time   Lisa Townsend  11/04/2024, 11:32 AM    Attending physician's note   I have taken a history, reviewed the chart, and examined the patient. I performed a substantive portion of this encounter, including complete performance of at least one of the key components, in conjunction with the APP. I agree with the APP's note, impression, and recommendations with my edits.  48 year old female with medical history as outlined above, including recent diagnosis of C. difficile colitis.  Initially did well with course of vancomycin  and Florastor, but had recurrence of diarrhea on 12/23 with associated nausea/vomiting and generalized abdominal pain.  She re-presented to the ER on 12/24 and ER evaluation notable for C. difficile positive, continued colitis on CT along with RLL infection/inflammation with RSV positive.  Nausea/vomiting improved and she was discharged home with Rx for Dificid .  She came back to the ER on 12/25 with recurrence of nausea/vomiting requiring admission.  Dificid  was continued on admission, but continues to have nausea/vomiting and nonbloody, watery diarrhea.  Poor p.o. intake.  1) C. difficile infection 2) Diarrhea - No appreciable response to 4 days of Dificid .  Recommend changing to high-dose vancomycin  and Flagyl  - Agree with inpatient ID consult - Continue IV fluids - Liquid diet as tolerated - Enteric precautions - Daily CBC, BMP  3) Nausea/vomiting - Could be related to the C. difficile colitis, but also can see nausea/vomiting with RSV.  Abdominal x-ray yesterday with few air-fluid loops of small bowel possibly representing ileus without significant SB distention. - Treating CDI as above - Compazine , Phenergan  as outlined  4) RSV - Droplet precautions - Supportive care - Management per primary Hospitalist service and consulting ID service  [address] DO, FACG [phone] office   A total of 75 minutes of time was spent on this  encounter, including in depth chart review, independent review of results as outlined above, communicating results with the patient directly, face-to-face time with the patient, coordinating care, and ordering studies and medications as appropriate, and documentation.            "

## 2024-11-04 NOTE — Consult Note (Signed)
 "        [facility]    Date of Admission:  10/31/2024     Reason for Consult: recurrent cdiff    Referring Provider: Rojelio     Lines:  Peripheral iv's  Abx: 12/25-12/29 difficid        Assessment: 48 yo female who was admitted 12/25 for recurrent cdiff, not responding to difficid, now with rising wbc, ileus  No sign of toxic megacolon yet Xray kub suggestive of ileus. She is not passing flatus as of 12/29 Exam is nontoxic but does have tense abd distension that is mildly tender  Wbc rising to low 20s  Afebrile   Direction is concerning. Would treat as severe disease and would use enema/iv flagyl /po vanc   Had asked primary team to discuss with surgery to have close monitoring in case she develops toxic megacolon. Gi consulted by primary team as well    Micro: Rsv positive Cdiff antigen and toxin positive    She has rsv infection but doesn't have much of uri sx or any hypoxic distress   Plan: Start oral vanc, enema vanc, and iv flagyl  Discussed with nursing to get enema vanc going Supportive care for rsv uri Maintain droplet and enteric cdiff precaution Discuss with primary team      ------------------------------------------------ Principal Problem:   C. difficile colitis Active Problems:   Hypothyroidism   Chronic migraine without aura without status migrainosus, not intractable   Generalized anxiety disorder   Primary insomnia   Intractable nausea and vomiting   RSV infection    HPI: Lisa Townsend is a 48 y.o. female medical history significant of migraines, anxiety, hypothyroidism, cdiff, admitted 10/31/24 for abd pain, n/v/diarrhea found to have recurrent cdiff    Patient received augmentin  for colitis earlier this month but cdiff testing returned positive so started on po vanc  Improved sx but then 2 days prior to this presentation developed nv/abd pain/diarrhea again   On admission: Afebrile Wbc  13 CT scan colitis similar to the study on 11/30 Started on difficid  5 days in worsening abd distension and rising leukocytosis Kub repeat shows ileus Id called  Gi pending consutl as well    Of note, she also has positive rsv pcr on 10/30/24 in setting 5 days sorethroat, cough, congestion   Family History  Problem Relation Age of Onset   Hypertension Mother    Breast cancer Mother 17   Urolithiasis Father    Heart disease Father    Allergic rhinitis Brother    Breast cancer Maternal Grandmother        dx under 50   Stomach cancer Paternal Grandmother        dx in her 35s   Alcohol abuse Maternal Uncle    Prostate cancer Paternal Uncle        mid 33s   Asthma Neg Hx    Eczema Neg Hx    Urticaria Neg Hx    Colon cancer Neg Hx    Colon polyps Neg Hx    Esophageal cancer Neg Hx    Rectal cancer Neg Hx     Social History[1]  Allergies[2]  Review of Systems: ROS All Other ROS was negative, except mentioned above   Past Medical History:  Diagnosis Date   Allergy    seasonal- allergy shots weekly to [facility]   Anxiety    Chronic constipation    daily to weekly constipation-  she states has a BM 2-3  x a week   Hypothyroidism    Iron deficiency anemia    Migraine    Urticaria    Vitamin D  deficiency        Scheduled Meds:  enoxaparin  (LOVENOX ) injection  40 mg Subcutaneous Q24H   liothyronine   5 mcg Oral BID   potassium chloride   40 mEq Oral BID   sertraline   50 mg Oral Daily   vancomycin  (VANCOCIN ) 500 mg in sodium chloride  irrigation 0.9 % 100 mL ENEMA  500 mg Rectal Q6H   vancomycin   500 mg Oral Q6H   Continuous Infusions:  sodium chloride  100 mL/hr at 11/04/24 1337   metronidazole  500 mg (11/04/24 1559)   PRN Meds:.acetaminophen  **OR** acetaminophen , albuterol , diazepam , ketorolac , prochlorperazine , promethazine    OBJECTIVE: Blood pressure 121/77, pulse 96, temperature 98.8 F (37.1 C), temperature source Oral, resp. rate 18, height 5' 1  (1.549 m), weight 66.7 kg, last menstrual period 12/19/2023, SpO2 99%.  Physical Exam  General/constitutional: no distress, pleasant HEENT: Normocephalic, PER, Conj Clear, EOMI, Oropharynx clear Neck supple CV: rrr no mrg Lungs: clear to auscultation, normal respiratory effort Abd: tense, distended, mildly tender Ext: no edema Skin: No Rash Neuro: nonfocal MSK: no peripheral joint swelling/tenderness/warmth; back spines nontender    Lab Results Lab Results  Component Value Date   WBC 21.5 (H) 11/04/2024   HGB 11.9 (L) 11/04/2024   HCT 35.2 (L) 11/04/2024   MCV 93.1 11/04/2024   PLT 296 11/04/2024    Lab Results  Component Value Date   CREATININE 0.46 11/04/2024   BUN 7 11/04/2024   NA 131 (L) 11/04/2024   K 3.1 (L) 11/04/2024   CL 104 11/04/2024   CO2 18 (L) 11/04/2024    Lab Results  Component Value Date   ALT 81 (H) 10/30/2024   AST 42 (H) 10/30/2024   ALKPHOS 100 10/30/2024   BILITOT 0.3 10/30/2024      Microbiology: Recent Results (from the past 240 hours)  Resp panel by RT-PCR (RSV, Flu A&B, Covid) Anterior Nasal Swab     Status: Abnormal   Collection Time: 10/30/24  8:49 PM   Specimen: Anterior Nasal Swab  Result Value Ref Range Status   SARS Coronavirus 2 by RT PCR NEGATIVE NEGATIVE Final    Comment: (NOTE) SARS-CoV-2 target nucleic acids are NOT DETECTED.  The SARS-CoV-2 RNA is generally detectable in upper respiratory specimens during the acute phase of infection. The lowest concentration of SARS-CoV-2 viral copies this assay can detect is 138 copies/mL. A negative result does not preclude SARS-Cov-2 infection and should not be used as the sole basis for treatment or other patient management decisions. A negative result may occur with  improper specimen collection/handling, submission of specimen other than nasopharyngeal swab, presence of viral mutation(s) within the areas targeted by this assay, and inadequate number of viral copies(<138  copies/mL). A negative result must be combined with clinical observations, patient history, and epidemiological information. The expected result is Negative.  Fact Sheet for Patients:  bloggercourse.com  Fact Sheet for Healthcare Providers:  seriousbroker.it  This test is no t yet approved or cleared by the [place]  FDA and  has been authorized for detection and/or diagnosis of SARS-CoV-2 by FDA under an Emergency Use Authorization (EUA). This EUA will remain  in effect (meaning this test can be used) for the duration of the COVID-19 declaration under Section 564(b)(1) of the Act, 21 U.S.C.section 360bbb-3(b)(1), unless the authorization is terminated  or revoked sooner.  Influenza A by PCR NEGATIVE NEGATIVE Final   Influenza B by PCR NEGATIVE NEGATIVE Final    Comment: (NOTE) The Xpert Xpress SARS-CoV-2/FLU/RSV plus assay is intended as an aid in the diagnosis of influenza from Nasopharyngeal swab specimens and should not be used as a sole basis for treatment. Nasal washings and aspirates are unacceptable for Xpert Xpress SARS-CoV-2/FLU/RSV testing.  Fact Sheet for Patients: bloggercourse.com  Fact Sheet for Healthcare Providers: seriousbroker.it  This test is not yet approved or cleared by the [place]  FDA and has been authorized for detection and/or diagnosis of SARS-CoV-2 by FDA under an Emergency Use Authorization (EUA). This EUA will remain in effect (meaning this test can be used) for the duration of the COVID-19 declaration under Section 564(b)(1) of the Act, 21 U.S.C. section 360bbb-3(b)(1), unless the authorization is terminated or revoked.     Resp Syncytial Virus by PCR POSITIVE (A) NEGATIVE Final    Comment: (NOTE) Fact Sheet for Patients: bloggercourse.com  Fact Sheet for Healthcare  Providers: seriousbroker.it  This test is not yet approved or cleared by the [place]  FDA and has been authorized for detection and/or diagnosis of SARS-CoV-2 by FDA under an Emergency Use Authorization (EUA). This EUA will remain in effect (meaning this test can be used) for the duration of the COVID-19 declaration under Section 564(b)(1) of the Act, 21 U.S.C. section 360bbb-3(b)(1), unless the authorization is terminated or revoked.  Performed at [facility], [address] [place], [place] [address]   C Difficile Quick Screen w PCR reflex     Status: Abnormal   Collection Time: 10/30/24 11:10 PM   Specimen: STOOL  Result Value Ref Range Status   C Diff antigen POSITIVE (A) NEGATIVE Final   C Diff toxin POSITIVE (A) NEGATIVE Final   C Diff interpretation Toxin producing C. difficile detected.  Final    Comment: CRITICAL RESULT CALLED TO, READ BACK BY AND VERIFIED WITH: RN S.GOUGE AT 9066 ON 10/31/2024 B T.SAAD. Performed at [facility], [address] [place], [place] [address]      Serology:    Imaging: If present, new imagings (plain films, ct scans, and mri) have been personally visualized and interpreted; radiology reports have been reviewed. Decision making incorporated into the Impression / Recommendations.  12/24 ct abd pelv with contrast 1. Pancolitis similar to 11 / 30 / 25. 2. Small airway infection / inflammation in the posterior right lower lobe.     Constance ONEIDA Passer, MD [facility] [phone] pager    11/04/2024, 7:44 PM      [1]  Social History Tobacco Use   Smoking status: Never    Passive exposure: Past   Smokeless tobacco: Never  Vaping Use   Vaping status: Never Used  Substance Use Topics   Alcohol use: Yes    Comment: rare   Drug use: No  [2]  Allergies Allergen Reactions   Claritin [Loratadine]     shakiness   Latex Itching    Lexapro  [Escitalopram ] Nausea Only   "

## 2024-11-04 NOTE — Progress Notes (Deleted)
 "    11/04/2024 Lisa Townsend [id number] Mar 30, 1976  Referring provider: Maude Rocker, PA-C Primary GI doctor: Dr. San  ASSESSMENT AND PLAN:  Positive C. difficile 10/15/2024 with colitis seen on CT in the ER 11/30 10/06/2024 KUB with mild colonic stool burden 10/06/2024 CTAP W nonspecific infectious or inflammatory pancolitis normal terminal ileum no bowel obstruction stomach normal unremarkable liver, gallbladder Status post vancomycin  4 times daily 125 for 4 days - Provided information on C. difficile infection. - Advised on low FODMAP or BRAT diet. - Recommended heating pad for symptom relief. - Prescribed dicyclomine  for pain management. - Ordered labs to rule out inflammatory bowel disease.  Anal fissure and hemorrhoid Posterior fissure and hemorrhoid likely due to frequent bowel movements. Symptoms include rectal discomfort and blood on wiping. - Prescribed compound cream for rectal discomfort and fissure. - Advised on rectal hygiene and care.  Constipaton Has had to take dulcolax 2-3 x a day prior to this  Can address after acute illness Consider pelvic floor PT  Elevated LFTs  CT abdomen pelvis 10/06/2024 unremarkable liver    Latest Ref Rng & Units 10/30/2024    3:43 PM 10/14/2024   10:15 AM 10/10/2024   11:23 AM  Hepatic Function  Total Protein 6.5 - 8.1 g/dL 6.4  6.2  5.7   Albumin 3.5 - 5.0 g/dL 4.0  3.7  3.4   AST 15 - 41 U/L 42  63  34   ALT 0 - 44 U/L 81  85  49   Alk Phosphatase 38 - 126 U/L 100  89  65   Total Bilirubin 0.0 - 1.2 mg/dL 0.3  0.3  0.3   Bilirubin, Direct 0.0 - 0.3 mg/dL  0.0     Platelets 705 10/30/2024 FIB4= 0.95  CCS 02/07/2022 colonoscopy 2 polyps 2 to 4 mm distal sigmoid transverse nonbleeding internal hemorrhoids normal TI good quality prep, benign colonic mucosa recall 10 years  Patient Care Team: Maude Rocker, PA-C as PCP - General (Physician Assistant) Luke Orlan HERO, DO as Consulting Physician  (Allergy)  HISTORY OF PRESENT ILLNESS: 48 y.o. female with a past medical history listed below presents for evaluation of diarrhea, AB pain, colitis on CT, treated for C. difficile colitis, here for follow-up.   I last saw the patient in the office 10/10/2024 for abnormal CT showing colitis with diarrhea and abdominal pain.  Patient had positive C. difficile treated with vancomycin  125 4 times daily for 10 days.  Discussed the use of AI scribe software for clinical note transcription with the patient, who gave verbal consent to proceed.  History of Present Illness          She  reports that she has never smoked. She has been exposed to tobacco smoke. She has never used smokeless tobacco. She reports current alcohol use. She reports that she does not use drugs.  RELEVANT GI HISTORY, IMAGING AND LABS: Results          CBC    Component Value Date/Time   WBC 21.5 (H) 11/04/2024 0447   RBC 3.78 (L) 11/04/2024 0447   HGB 11.9 (L) 11/04/2024 0447   HCT 35.2 (L) 11/04/2024 0447   PLT 296 11/04/2024 0447   MCV 93.1 11/04/2024 0447   MCV 97.5 (A) 08/05/2014 1423   MCH 31.5 11/04/2024 0447   MCHC 33.8 11/04/2024 0447   RDW 12.4 11/04/2024 0447   LYMPHSABS 0.9 10/31/2024 1042   MONOABS 1.1 (H) 10/31/2024 1042   EOSABS 0.0 10/31/2024  1042   BASOSABS 0.0 10/31/2024 1042   Recent Labs    10/06/24 1832 10/10/24 1123 10/30/24 1543 10/31/24 1042 11/01/24 0428 11/02/24 0507 11/03/24 0517 11/04/24 0447  HGB 13.9 12.2 13.6 14.9 14.8 14.1 12.6 11.9*    CMP     Component Value Date/Time   NA 131 (L) 11/04/2024 0447   K 3.1 (L) 11/04/2024 0447   CL 104 11/04/2024 0447   CO2 18 (L) 11/04/2024 0447   GLUCOSE 110 (H) 11/04/2024 0447   BUN 7 11/04/2024 0447   CREATININE 0.46 11/04/2024 0447   CREATININE 0.92 06/08/2016 0925   CALCIUM 6.8 (L) 11/04/2024 0447   PROT 6.4 (L) 10/30/2024 1543   ALBUMIN 4.0 10/30/2024 1543   AST 42 (H) 10/30/2024 1543   ALT 81 (H) 10/30/2024 1543    ALKPHOS 100 10/30/2024 1543   BILITOT 0.3 10/30/2024 1543   GFRNONAA >60 11/04/2024 0447   GFRAA >90 07/26/2012 0352      Latest Ref Rng & Units 10/30/2024    3:43 PM 10/14/2024   10:15 AM 10/10/2024   11:23 AM  Hepatic Function  Total Protein 6.5 - 8.1 g/dL 6.4  6.2  5.7   Albumin 3.5 - 5.0 g/dL 4.0  3.7  3.4   AST 15 - 41 U/L 42  63  34   ALT 0 - 44 U/L 81  85  49   Alk Phosphatase 38 - 126 U/L 100  89  65   Total Bilirubin 0.0 - 1.2 mg/dL 0.3  0.3  0.3   Bilirubin, Direct 0.0 - 0.3 mg/dL  0.0        Current Medications:   Facility-Administered Medications Ordered in Other Visits (Endocrine & Metabolic):    liothyronine  (CYTOMEL ) tablet 5 mcg  Facility-Administered Medications Ordered in Other Visits (Respiratory):    albuterol  (PROVENTIL ) (2.5 MG/3ML) 0.083% nebulizer solution 2.5 mg   promethazine  (PHENERGAN ) tablet 12.5 mg  Facility-Administered Medications Ordered in Other Visits (Analgesics):    acetaminophen  (TYLENOL ) tablet 650 mg **OR** acetaminophen  (TYLENOL ) suppository 650 mg   ketorolac  (TORADOL ) 15 MG/ML injection 15 mg   oxyCODONE  (Oxy IR/ROXICODONE ) immediate release tablet 5 mg  Facility-Administered Medications Ordered in Other Visits (Hematological):    enoxaparin  (LOVENOX ) injection 40 mg  Facility-Administered Medications Ordered in Other Visits (Other):    0.9 %  sodium chloride  infusion   diazepam  (VALIUM ) tablet 10 mg   fidaxomicin  (DIFICID ) tablet 200 mg   potassium chloride  SA (KLOR-CON  M) CR tablet 40 mEq   prochlorperazine  (COMPAZINE ) injection 10 mg   saccharomyces boulardii (FLORASTOR) capsule 250 mg   sertraline  (ZOLOFT ) tablet 50 mg  No current facility-administered medications for this visit. No current outpatient medications on file.  Medical History:  Past Medical History:  Diagnosis Date   Allergy    seasonal- allergy shots weekly to [facility]   Anxiety    Chronic constipation    daily to weekly constipation-  she states has a  BM 2-3 x a week   Hypothyroidism    Iron deficiency anemia    Migraine    Urticaria    Vitamin D  deficiency    Allergies:  Allergies  Allergen Reactions   Claritin [Loratadine]     shakiness   Latex Itching   Lexapro  [Escitalopram ] Nausea Only     Surgical History:  She  has a past surgical history that includes Nose surgery; Breast biopsy (Left, 02/03/2014); and Cesarean section (N/A, 08/04/2021). Family History:  Her family history includes Alcohol abuse  in her maternal uncle; Allergic rhinitis in her brother; Breast cancer in her maternal grandmother; Breast cancer (age of onset: 95) in her mother; Heart disease in her father; Hypertension in her mother; Prostate cancer in her paternal uncle; Stomach cancer in her paternal grandmother; Urolithiasis in her father.  REVIEW OF SYSTEMS  : All other systems reviewed and negative except where noted in the History of Present Illness.  PHYSICAL EXAM: LMP 12/19/2023  Physical Exam          Alan JONELLE Coombs, PA-C 7:40 AM   "

## 2024-11-04 NOTE — Progress Notes (Signed)
 Pt voided. However no release of the Vancomycin  enema that was reported as being given earlier. Will continue to monitor

## 2024-11-04 NOTE — Plan of Care (Signed)
   Problem: Coping: Goal: Level of anxiety will decrease Outcome: Progressing   Problem: Pain Managment: Goal: General experience of comfort will improve and/or be controlled Outcome: Progressing

## 2024-11-04 NOTE — Progress Notes (Signed)
" °   11/04/24 1129  TOC Brief Assessment  Insurance and Status Reviewed  Patient has primary care physician Yes  Home environment has been reviewed single family home  Prior level of function: independent  Prior/Current Home Services No current home services  Social Drivers of Health Review SDOH reviewed no interventions necessary  Readmission risk has been reviewed Yes  Transition of care needs no transition of care needs at this time    Signed: Heather Saltness, MSW, LCSW Clinical Social Worker Inpatient Care Management 11/04/2024 11:29 AM   "

## 2024-11-04 NOTE — Progress Notes (Addendum)
 " PROGRESS NOTE    Lisa Townsend  FMW:[id number] DOB: 05/20/1976 DOA: 10/31/2024 PCP: Maude Rocker, PA-C     Brief Narrative:  Lisa Townsend is a 48 y.o. female with medical history significant of migraines, anxiety, hypothyroidism who presented to [facility] ED for evaluation of persistent abdominal pain with nausea/vomiting and diarrhea.   Patient had been treated with a course of Augmentin  for colitis at the beginning of December (seen in the ED 11/30).  She reportedly had follow-up with GI and was feeling better after finishing antibiotics.  2 days ago, patient had recurrence of abdominal pain which became severe yesterday morning so she presented again to the ED.  CT scan colitis similar to the study on 11/30.  Patient reports ongoing diarrhea, approximately 20 episodes in the past 2 to 3 days. Patient also has recently developed a productive cough, sore throat and congestion that started 5 to 6 days ago.   Viral PCR was positive for RSV, negative for COVID and influenza A/B. C. Difficile antigen and toxin positive. Imaging--CT abdomen pelvis with contrast showed pancolitis similar to that seen on 10/06/2024, small airway infection/inflammation in the posterior right lower lobe.  Patient started on Dificid  and admitted to the hospital.  New events last 24 hours / Subjective: Feeling worse today.  Has no appetite, continues to have nausea.  Has had 1 liquid stool yesterday.  Abdomen feels bloated, some tenderness in the lower quadrants.  States that yesterday, she did notice some bleeding after having a bowel movement.  Assessment & Plan:   Principal Problem:   C. difficile colitis Active Problems:   Intractable nausea and vomiting   RSV infection   Hypothyroidism   Chronic migraine without aura without status migrainosus, not intractable   Generalized anxiety disorder   Primary insomnia   C. difficile colitis - Recently finished course of Augmentin  at the end of November  for colitis.  She subsequently tested positive for C. difficile December 9 and was started on oral vancomycin  for 10 days.  She states that her symptoms improved and she completed her treatment on December 19, however her symptoms did returned - Started on Dificid  12/25 - Supportive care, IV fluid - KUB revealed few air-filled loops of small bowel, may represent ileus - WBC improved initially 21.8-->17.7 but then worsened the past couple days, WBC 21.5 today - Patient really has not had improvement as would be expected.  GI and infectious disease consulted today.  Sent message to Dr. Overton and Dr. San  Addendum: Spoke with Dr. Overton.  Patient is getting started on p.o. Vanco, Vanco enema, Flagyl .  He recommended getting general surgery consult.  Spoke with Dr. Rubin over the phone and discussed case.  He did not believe there was need for surgical intervention at this point for ileus, recommended continue antibiotic management, GI involvement.  RSV - Supportive care  Insomnia - Valium  as needed  Generalized anxiety disorder - Zoloft   Hypothyroidism - Levothyroxine   Hypokalemia - Replace   DVT prophylaxis:  enoxaparin  (LOVENOX ) injection 40 mg Start: 10/31/24 2200  Code Status: Full code Family Communication: None Disposition Plan: Home Status is: Inpatient   Antimicrobials:  Anti-infectives (From admission, onward)    Start     Dose/Rate Route Frequency Ordered Stop   10/31/24 1415  fidaxomicin  (DIFICID ) tablet 200 mg        200 mg Oral 2 times daily 10/31/24 1021 11/10/24 0959        Objective: Vitals:  11/03/24 0524 11/03/24 1037 11/03/24 1934 11/04/24 0956  BP: 112/69 113/79 126/78   Pulse: 98 100 (!) 107   Resp: 16 14 19    Temp: 99.8 F (37.7 C) 98.1 F (36.7 C) 99.1 F (37.3 C)   TempSrc: Oral Oral Oral   SpO2: 94% 95% 96%   Weight:    66.7 kg  Height:    5' 1 (1.549 m)    Intake/Output Summary (Last 24 hours) at 11/04/2024 1244 Last data filed  at 11/04/2024 1000 Gross per 24 hour  Intake 2408.46 ml  Output 0 ml  Net 2408.46 ml   Filed Weights   11/04/24 0956  Weight: 66.7 kg    Examination:  General exam: Appears calm, uncomfortable Respiratory system: Respiratory effort normal. No respiratory distress. No conversational dyspnea.  On room air Gastrointestinal system: Abdomen is nondistended, soft, some tenderness to palpation lower abdomen  Central nervous system: Alert and oriented. No focal neurological deficits. Speech clear.  Extremities: Symmetric in appearance  Skin: No rashes, lesions or ulcers on exposed skin  Psychiatry: Judgement and insight appear normal. Mood & affect appropriate.   Data Reviewed: I have personally reviewed following labs and imaging studies  CBC: Recent Labs  Lab 10/31/24 1042 11/01/24 0428 11/02/24 0507 11/03/24 0517 11/04/24 0447  WBC 21.8* 21.9* 17.7* 19.4* 21.5*  NEUTROABS 19.6*  --   --   --   --   HGB 14.9 14.8 14.1 12.6 11.9*  HCT 43.7 43.5 42.0 37.4 35.2*  MCV 92.6 93.5 94.2 93.3 93.1  PLT 312 289 261 262 296   Basic Metabolic Panel: Recent Labs  Lab 10/31/24 1042 11/01/24 0428 11/02/24 0507 11/03/24 0517 11/04/24 0447  NA 137 134* 136 132* 131*  K 3.7 3.4* 3.8 3.5 3.1*  CL 99 103 102 102 104  CO2 25 22 19* 22 18*  GLUCOSE 147* 123* 103* 113* 110*  BUN 9 11 14 9 7   CREATININE 0.77 0.66 0.75 0.61 0.46  CALCIUM 8.0* 6.7* 7.5* 7.1* 6.8*  MG 2.0 1.9 2.3  --   --    GFR: Estimated Creatinine Clearance: 74.4 mL/min (by C-G formula based on SCr of 0.46 mg/dL). Liver Function Tests: Recent Labs  Lab 10/30/24 1543  AST 42*  ALT 81*  ALKPHOS 100  BILITOT 0.3  PROT 6.4*  ALBUMIN 4.0   Recent Labs  Lab 10/30/24 1543  LIPASE 18   No results for input(s): AMMONIA in the last 168 hours. Coagulation Profile: No results for input(s): INR, PROTIME in the last 168 hours. Cardiac Enzymes: No results for input(s): CKTOTAL, CKMB, CKMBINDEX, TROPONINI  in the last 168 hours. BNP (last 3 results) No results for input(s): PROBNP in the last 8760 hours. HbA1C: No results for input(s): HGBA1C in the last 72 hours. CBG: No results for input(s): GLUCAP in the last 168 hours. Lipid Profile: No results for input(s): CHOL, HDL, LDLCALC, TRIG, CHOLHDL, LDLDIRECT in the last 72 hours. Thyroid  Function Tests: No results for input(s): TSH, T4TOTAL, FREET4, T3FREE, THYROIDAB in the last 72 hours. Anemia Panel: No results for input(s): VITAMINB12, FOLATE, FERRITIN, TIBC, IRON, RETICCTPCT in the last 72 hours. Sepsis Labs: No results for input(s): PROCALCITON, LATICACIDVEN in the last 168 hours.  Recent Results (from the past 240 hours)  Resp panel by RT-PCR (RSV, Flu A&B, Covid) Anterior Nasal Swab     Status: Abnormal   Collection Time: 10/30/24  8:49 PM   Specimen: Anterior Nasal Swab  Result Value Ref Range Status  SARS Coronavirus 2 by RT PCR NEGATIVE NEGATIVE Final    Comment: (NOTE) SARS-CoV-2 target nucleic acids are NOT DETECTED.  The SARS-CoV-2 RNA is generally detectable in upper respiratory specimens during the acute phase of infection. The lowest concentration of SARS-CoV-2 viral copies this assay can detect is 138 copies/mL. A negative result does not preclude SARS-Cov-2 infection and should not be used as the sole basis for treatment or other patient management decisions. A negative result may occur with  improper specimen collection/handling, submission of specimen other than nasopharyngeal swab, presence of viral mutation(s) within the areas targeted by this assay, and inadequate number of viral copies(<138 copies/mL). A negative result must be combined with clinical observations, patient history, and epidemiological information. The expected result is Negative.  Fact Sheet for Patients:  bloggercourse.com  Fact Sheet for Healthcare Providers:   seriousbroker.it  This test is no t yet approved or cleared by the [place]  FDA and  has been authorized for detection and/or diagnosis of SARS-CoV-2 by FDA under an Emergency Use Authorization (EUA). This EUA will remain  in effect (meaning this test can be used) for the duration of the COVID-19 declaration under Section 564(b)(1) of the Act, 21 U.S.C.section 360bbb-3(b)(1), unless the authorization is terminated  or revoked sooner.       Influenza A by PCR NEGATIVE NEGATIVE Final   Influenza B by PCR NEGATIVE NEGATIVE Final    Comment: (NOTE) The Xpert Xpress SARS-CoV-2/FLU/RSV plus assay is intended as an aid in the diagnosis of influenza from Nasopharyngeal swab specimens and should not be used as a sole basis for treatment. Nasal washings and aspirates are unacceptable for Xpert Xpress SARS-CoV-2/FLU/RSV testing.  Fact Sheet for Patients: bloggercourse.com  Fact Sheet for Healthcare Providers: seriousbroker.it  This test is not yet approved or cleared by the [place]  FDA and has been authorized for detection and/or diagnosis of SARS-CoV-2 by FDA under an Emergency Use Authorization (EUA). This EUA will remain in effect (meaning this test can be used) for the duration of the COVID-19 declaration under Section 564(b)(1) of the Act, 21 U.S.C. section 360bbb-3(b)(1), unless the authorization is terminated or revoked.     Resp Syncytial Virus by PCR POSITIVE (A) NEGATIVE Final    Comment: (NOTE) Fact Sheet for Patients: bloggercourse.com  Fact Sheet for Healthcare Providers: seriousbroker.it  This test is not yet approved or cleared by the [place]  FDA and has been authorized for detection and/or diagnosis of SARS-CoV-2 by FDA under an Emergency Use Authorization (EUA). This EUA will remain in effect (meaning this test can be used)  for the duration of the COVID-19 declaration under Section 564(b)(1) of the Act, 21 U.S.C. section 360bbb-3(b)(1), unless the authorization is terminated or revoked.  Performed at [facility], [address] [place], [place] [address]   C Difficile Quick Screen w PCR reflex     Status: Abnormal   Collection Time: 10/30/24 11:10 PM   Specimen: STOOL  Result Value Ref Range Status   C Diff antigen POSITIVE (A) NEGATIVE Final   C Diff toxin POSITIVE (A) NEGATIVE Final   C Diff interpretation Toxin producing C. difficile detected.  Final    Comment: CRITICAL RESULT CALLED TO, READ BACK BY AND VERIFIED WITH: RN S.GOUGE AT 9066 ON 10/31/2024 B T.SAAD. Performed at [facility], [address], [place], [place] [address]       Radiology Studies: DG Abd 1 View Result Date: 11/03/2024 CLINICAL DATA:  Ileus.  Abdominal  pain. EXAM: DG ABDOMEN 1V COMPARISON:  CT 10/30/2024 FINDINGS: Few air-filled loops of small bowel in the central abdomen. No abnormal small bowel distension. No significant formed stool in the colon. No evidence of free air on supine views. No radiopaque calculi. IMPRESSION: Few air-filled loops of small bowel in the central abdomen, nonspecific, may represent ileus. No abnormal small bowel distension. Electronically Signed   By: Andrea Gasman M.D.   On: 11/03/2024 17:16       Scheduled Meds:  enoxaparin  (LOVENOX ) injection  40 mg Subcutaneous Q24H   fidaxomicin   200 mg Oral BID   liothyronine   5 mcg Oral BID   potassium chloride   40 mEq Oral BID   saccharomyces boulardii  250 mg Oral BID   sertraline   50 mg Oral Daily   Continuous Infusions:  sodium chloride  100 mL/hr at 11/04/24 0243     LOS: 3 days   Time spent: 25 minutes   Delon Hoe, DO [facility] 11/04/2024, 12:44 PM   Available via Epic secure chat 7am-7pm After these hours, please refer to coverage provider listed on [email or web address]  "

## 2024-11-05 ENCOUNTER — Inpatient Hospital Stay (HOSPITAL_COMMUNITY)

## 2024-11-05 DIAGNOSIS — K5100 Ulcerative (chronic) pancolitis without complications: Secondary | ICD-10-CM | POA: Diagnosis not present

## 2024-11-05 DIAGNOSIS — A0472 Enterocolitis due to Clostridium difficile, not specified as recurrent: Secondary | ICD-10-CM | POA: Diagnosis not present

## 2024-11-05 DIAGNOSIS — D72829 Elevated white blood cell count, unspecified: Secondary | ICD-10-CM

## 2024-11-05 DIAGNOSIS — R112 Nausea with vomiting, unspecified: Secondary | ICD-10-CM | POA: Diagnosis not present

## 2024-11-05 LAB — CBC
HCT: 33.3 % — ABNORMAL LOW (ref 36.0–46.0)
Hemoglobin: 11.4 g/dL — ABNORMAL LOW (ref 12.0–15.0)
MCH: 31.5 pg (ref 26.0–34.0)
MCHC: 34.2 g/dL (ref 30.0–36.0)
MCV: 92 fL (ref 80.0–100.0)
Platelets: 296 K/uL (ref 150–400)
RBC: 3.62 MIL/uL — ABNORMAL LOW (ref 3.87–5.11)
RDW: 12.2 % (ref 11.5–15.5)
WBC: 20.2 K/uL — ABNORMAL HIGH (ref 4.0–10.5)
nRBC: 0 % (ref 0.0–0.2)

## 2024-11-05 LAB — BASIC METABOLIC PANEL WITH GFR
Anion gap: 9 (ref 5–15)
BUN: 5 mg/dL — ABNORMAL LOW (ref 6–20)
CO2: 19 mmol/L — ABNORMAL LOW (ref 22–32)
Calcium: 7.2 mg/dL — ABNORMAL LOW (ref 8.9–10.3)
Chloride: 107 mmol/L (ref 98–111)
Creatinine, Ser: 0.52 mg/dL (ref 0.44–1.00)
GFR, Estimated: >60 mL/min
Glucose, Bld: 118 mg/dL — ABNORMAL HIGH (ref 70–99)
Potassium: 3.4 mmol/L — ABNORMAL LOW (ref 3.5–5.1)
Sodium: 134 mmol/L — ABNORMAL LOW (ref 135–145)

## 2024-11-05 LAB — MAGNESIUM: Magnesium: 2.4 mg/dL (ref 1.7–2.4)

## 2024-11-05 MED ORDER — POTASSIUM CHLORIDE CRYS ER 20 MEQ PO TBCR
40.0000 meq | EXTENDED_RELEASE_TABLET | Freq: Once | ORAL | Status: AC
  Administered 2024-11-05: 40 meq via ORAL
  Filled 2024-11-05: qty 2

## 2024-11-05 MED ORDER — OXYCODONE HCL 5 MG PO TABS
5.0000 mg | ORAL_TABLET | Freq: Four times a day (QID) | ORAL | Status: AC | PRN
  Administered 2024-11-05 – 2024-11-06 (×2): 5 mg via ORAL
  Filled 2024-11-05 (×2): qty 1

## 2024-11-05 NOTE — Progress Notes (Addendum)
 "    [facility] Progress Note  CC: C. difficile colitis  Subjective: She is feeling significantly better today. Less nausea, tolerating clear liquid diet.  Diarrhea has decreased.  She endorsed passing 4 episodes of nonbloody diarrhea overnight and x 2 episodes this morning since 6 AM.  She is hungry and wishes to advance her diet. She has a mild intermittent nonproductive cough.  No shortness of breath.  No chest pain.  RN at the bedside.   Objective:  Vital signs in last 24 hours: Temp:  [97.7 F (36.5 C)-98.8 F (37.1 C)] 97.7 F (36.5 C) (12/30 0533) Pulse Rate:  [87-96] 87 (12/30 0533) Resp:  [18] 18 (12/30 0533) BP: (101-121)/(59-77) 108/75 (12/30 0533) SpO2:  [97 %-99 %] 97 % (12/30 0533) Weight:  [66.7 kg] 66.7 kg (12/29 0956) Last BM Date : 11/04/24 General: Alert 48 year old female in no acute distress.  She is less lethargic and more conversant today. Heart: Regular rate and rhythm, no murmurs. Pulm: Breath sounds clear on room air. Abdomen: Abdomen is soft, mildly distended.  Nontender.  Positive bowel sounds to all 4 quadrants. Extremities: No lower extremity edema. Neurologic:  Alert and oriented x 4. Grossly normal neurologically. Psych:  Alert and cooperative. Normal mood and affect.  Intake/Output from previous day: 12/29 0701 - 12/30 0700 In: 2603.4 [P.O.:240; I.V.:2163.4; IV Piggyback:200] Out: 0  Intake/Output this shift: No intake/output data recorded.  Lab Results: Recent Labs    11/03/24 0517 11/04/24 0447 11/05/24 0506  WBC 19.4* 21.5* 20.2*  HGB 12.6 11.9* 11.4*  HCT 37.4 35.2* 33.3*  PLT 262 296 296   BMET Recent Labs    11/03/24 0517 11/04/24 0447 11/05/24 0506  NA 132* 131* 134*  K 3.5 3.1* 3.4*  CL 102 104 107  CO2 22 18* 19*  GLUCOSE 113* 110* 118*  BUN 9 7 5*  CREATININE 0.61 0.46 0.52  CALCIUM 7.1* 6.8* 7.2*   LFT No results for input(s): PROT, ALBUMIN, AST, ALT, ALKPHOS, BILITOT, BILIDIR,  IBILI in the last 72 hours. PT/INR No results for input(s): LABPROT, INR in the last 72 hours. Hepatitis Panel No results for input(s): HEPBSAG, HCVAB, HEPAIGM, HEPBIGM in the last 72 hours.  DG Abd 1 View Result Date: 11/03/2024 CLINICAL DATA:  Ileus.  Abdominal pain. EXAM: DG ABDOMEN 1V COMPARISON:  CT 10/30/2024 FINDINGS: Few air-filled loops of small bowel in the central abdomen. No abnormal small bowel distension. No significant formed stool in the colon. No evidence of free air on supine views. No radiopaque calculi. IMPRESSION: Few air-filled loops of small bowel in the central abdomen, nonspecific, may represent ileus. No abnormal small bowel distension. Electronically Signed   By: Andrea Gasman M.D.   On: 11/03/2024 17:16    Assessment / Plan:  48 year old female previously diagnosed with pancolitis per CTAP 10/06/2024 treated with a course of Augmentin , subsequently developed diarrhea, tested positive for C. difficile 10/10/2024 as an outpatient and prescribed a course of Vancomycin  125 mg 4 times daily for 10 days. Diarrhea initially abated then recurred 10/29/2024. Patient presented to the ED 12/24 with N/V/D and abdominal pain. CTAP showed pancolitis, unchanged when compared to prior CT. She was prescribed Dificid  200 mg p.o. twice daily x 10 days and was discharged home. She took 1 dose of Dificid  12/25 but was readmitted later the same day with persistent N/V/D and generalized abdominal pain consistent with C. difficile colitis.  ID consulted, Dificid  discontinued 12/29 and started on Vancomycin  500mg  p.o Q 6  hrs, Vancomycin  500mg  enemas Q 6hrs and Flagyl  IV Q 8 hrs. WBC 21.5 -> 20.2. Afebrile.  Hemodynamically stable. - Clear liquid diet - Abdominal x-ray, if no evidence of ileus will advance diet as tolerated - IV fluids and pain management per the hospitalist - Continue Compazine  10 mg IV every 6 hours as needed and Phenergan  12.5 mg p.o. every 6 hours as needed -  Enteric precautions maintained - Nursing staff to document bowel movement frequency every shift - CBC and CMP in am - Await further recommendations per Dr. San   Possible ileus per abdominal x-ray 12/28. K 3.4. Magnesium 2.4.  - Clear liquid diet - Keep k+ > 4 < 5 and magnesium 2 to 2.5   RSV positive 12/24, mild intermittent nonproductive cough    Principal Problem:   C. difficile colitis Active Problems:   Hypothyroidism   Chronic migraine without aura without status migrainosus, not intractable   Generalized anxiety disorder   Primary insomnia   Intractable nausea and vomiting   RSV infection     LOS: 4 days   Elida HERO Kennedy-Smith  11/05/2024, 9:58 AM   Attending physician's note   I have taken a history, reviewed the chart, and examined the patient. I performed a substantive portion of this encounter, including complete performance of at least one of the key components, in conjunction with the APP. I agree with the APP's note, impression, and recommendations with my edits.   Feeling overall better today.  Decreased stool output and urgency, nausea/vomiting improving and tolerating CLD and asking to advance diet.  Abdomen feels better and less tender and not distended.  WBC improved slightly 21 --> 20 with normal renal function.  1) C. difficile infection 2) Diarrhea 3) Leukocytosis-improving - Continue vancomycin  high-dose oral and PR - Continue Flagyl  - ID service consulted and following - Daily CBC and BMP check - Will slowly advance diet - Enteric contact precautions  4) Nausea/vomiting - Symptoms improving - Abdominal x-ray repeated today.  Will follow-up on images and report - Continue Compazine , Phenergan  - Daily electrolytes with repletion per protocol  5) RSV - Droplet precautions - Supportive management  Antoniette Peake, DO, FACG [phone] office   A total of 50 minutes of time was spent on this encounter, including in depth chart  review, independent review of results as outlined above, communicating results with the patient directly, face-to-face time with the patient, coordinating care, and ordering studies and medications as appropriate, and documentation.         "

## 2024-11-05 NOTE — Plan of Care (Signed)
   Problem: Activity: Goal: Risk for activity intolerance will decrease Outcome: Progressing   Problem: Coping: Goal: Level of anxiety will decrease Outcome: Progressing   Problem: Pain Managment: Goal: General experience of comfort will improve and/or be controlled Outcome: Progressing

## 2024-11-05 NOTE — Progress Notes (Signed)
 " PROGRESS NOTE    Lisa Townsend  FMW:[id number] DOB: 07-20-1976 DOA: 10/31/2024 PCP: Maude Rocker, PA-C     Brief Narrative:  Lisa Townsend is a 48 y.o. female with medical history significant of migraines, anxiety, hypothyroidism who presented to [facility] ED for evaluation of persistent abdominal pain with nausea/vomiting and diarrhea.   Patient had been treated with a course of Augmentin  for colitis at the beginning of December (seen in the ED 11/30).  She reportedly had follow-up with GI and was feeling better after finishing antibiotics.  2 days ago, patient had recurrence of abdominal pain which became severe yesterday morning so she presented again to the ED.  CT scan colitis similar to the study on 11/30.  Patient reports ongoing diarrhea, approximately 20 episodes in the past 2 to 3 days. Patient also has recently developed a productive cough, sore throat and congestion that started 5 to 6 days ago.   Viral PCR was positive for RSV, negative for COVID and influenza A/B. C. Difficile antigen and toxin positive. Imaging--CT abdomen pelvis with contrast showed pancolitis similar to that seen on 10/06/2024, small airway infection/inflammation in the posterior right lower lobe.  Patient started on Dificid  and admitted to the hospital.  Patient had lack of improvement, developed ileus.  GI and ID were consulted.  She was started on p.o. vancomycin , rectal vancomycin  enema, IV Flagyl .  New events last 24 hours / Subjective: Feeling a little bit better this morning.  Denies any worsening abdominal pain or distention.  Assessment & Plan:   Principal Problem:   C. difficile colitis Active Problems:   Intractable nausea and vomiting   RSV infection   Hypothyroidism   Chronic migraine without aura without status migrainosus, not intractable   Generalized anxiety disorder   Primary insomnia   Severe C. difficile colitis - Recently finished course of Augmentin  at the end of  November for colitis.  She subsequently tested positive for C. difficile December 9 and was started on oral vancomycin  for 10 days.  She states that her symptoms improved and she completed her treatment on December 19, however her symptoms did return  - Started on Dificid  12/25 - Supportive care, IV fluid - KUB revealed few air-filled loops of small bowel, may represent ileus - WBC improved initially 21.8-->17.7 but then worsened the past couple days, WBC 21.5.  - GI and infectious disease consulted  - Discussed with surgery team, no role for surgery at this time.  Call back if needed. - Started oral Vanco, Vanco enema, Flagyl  12/29  RSV - Supportive care  Insomnia - Valium  as needed  Generalized anxiety disorder - Zoloft   Hypothyroidism - Levothyroxine   Hypokalemia - Replace   DVT prophylaxis:  enoxaparin  (LOVENOX ) injection 40 mg Start: 10/31/24 2200  Code Status: Full code Family Communication: None Disposition Plan: Home Status is: Inpatient   Antimicrobials:  Anti-infectives (From admission, onward)    Start     Dose/Rate Route Frequency Ordered Stop   11/04/24 1800  vancomycin  (VANCOCIN ) capsule 500 mg        500 mg Oral Every 6 hours 11/04/24 1424 11/18/24 1759   11/04/24 1800  vancomycin  (VANCOCIN ) 500 mg in sodium chloride  irrigation 0.9 % 100 mL ENEMA        500 mg Rectal Every 6 hours 11/04/24 1424 11/18/24 1759   11/04/24 1515  metroNIDAZOLE  (FLAGYL ) IVPB 500 mg        500 mg 100 mL/hr over 60 Minutes Intravenous Every 8  hours 11/04/24 1424 11/18/24 1514   10/31/24 1415  fidaxomicin  (DIFICID ) tablet 200 mg  Status:  Discontinued        200 mg Oral 2 times daily 10/31/24 1021 11/04/24 1424        Objective: Vitals:   11/04/24 0956 11/04/24 1402 11/04/24 2000 11/05/24 0533  BP:  121/77 (!) 101/59 108/75  Pulse:  96 96 87  Resp:  18 18 18   Temp:  98.8 F (37.1 C) 98.4 F (36.9 C) 97.7 F (36.5 C)  TempSrc:  Oral Oral Oral  SpO2:  99% 99% 97%   Weight: 66.7 kg     Height: 5' 1 (1.549 m)       Intake/Output Summary (Last 24 hours) at 11/05/2024 1219 Last data filed at 11/05/2024 0654 Gross per 24 hour  Intake 2243.47 ml  Output 0 ml  Net 2243.47 ml   Filed Weights   11/04/24 0956  Weight: 66.7 kg    Examination:  General exam: Appears calm, comfortable  Respiratory system: Respiratory effort normal. No respiratory distress. No conversational dyspnea.  On room air Gastrointestinal system: Abdomen is nondistended, soft, not TTP  Central nervous system: Alert and oriented. No focal neurological deficits. Speech clear.  Extremities: Symmetric in appearance  Skin: No rashes, lesions or ulcers on exposed skin  Psychiatry: Judgement and insight appear normal. Mood & affect appropriate.   Data Reviewed: I have personally reviewed following labs and imaging studies  CBC: Recent Labs  Lab 10/31/24 1042 11/01/24 0428 11/02/24 0507 11/03/24 0517 11/04/24 0447 11/05/24 0506  WBC 21.8* 21.9* 17.7* 19.4* 21.5* 20.2*  NEUTROABS 19.6*  --   --   --   --   --   HGB 14.9 14.8 14.1 12.6 11.9* 11.4*  HCT 43.7 43.5 42.0 37.4 35.2* 33.3*  MCV 92.6 93.5 94.2 93.3 93.1 92.0  PLT 312 289 261 262 296 296   Basic Metabolic Panel: Recent Labs  Lab 10/31/24 1042 11/01/24 0428 11/02/24 0507 11/03/24 0517 11/04/24 0447 11/05/24 0506  NA 137 134* 136 132* 131* 134*  K 3.7 3.4* 3.8 3.5 3.1* 3.4*  CL 99 103 102 102 104 107  CO2 25 22 19* 22 18* 19*  GLUCOSE 147* 123* 103* 113* 110* 118*  BUN 9 11 14 9 7  5*  CREATININE 0.77 0.66 0.75 0.61 0.46 0.52  CALCIUM 8.0* 6.7* 7.5* 7.1* 6.8* 7.2*  MG 2.0 1.9 2.3  --   --  2.4   GFR: Estimated Creatinine Clearance: 74.4 mL/min (by C-G formula based on SCr of 0.52 mg/dL). Liver Function Tests: Recent Labs  Lab 10/30/24 1543  AST 42*  ALT 81*  ALKPHOS 100  BILITOT 0.3  PROT 6.4*  ALBUMIN 4.0   Recent Labs  Lab 10/30/24 1543  LIPASE 18   No results for input(s): AMMONIA in  the last 168 hours. Coagulation Profile: No results for input(s): INR, PROTIME in the last 168 hours. Cardiac Enzymes: No results for input(s): CKTOTAL, CKMB, CKMBINDEX, TROPONINI in the last 168 hours. BNP (last 3 results) No results for input(s): PROBNP in the last 8760 hours. HbA1C: No results for input(s): HGBA1C in the last 72 hours. CBG: No results for input(s): GLUCAP in the last 168 hours. Lipid Profile: No results for input(s): CHOL, HDL, LDLCALC, TRIG, CHOLHDL, LDLDIRECT in the last 72 hours. Thyroid  Function Tests: No results for input(s): TSH, T4TOTAL, FREET4, T3FREE, THYROIDAB in the last 72 hours. Anemia Panel: No results for input(s): VITAMINB12, FOLATE, FERRITIN, TIBC, IRON, RETICCTPCT  in the last 72 hours. Sepsis Labs: No results for input(s): PROCALCITON, LATICACIDVEN in the last 168 hours.  Recent Results (from the past 240 hours)  Resp panel by RT-PCR (RSV, Flu A&B, Covid) Anterior Nasal Swab     Status: Abnormal   Collection Time: 10/30/24  8:49 PM   Specimen: Anterior Nasal Swab  Result Value Ref Range Status   SARS Coronavirus 2 by RT PCR NEGATIVE NEGATIVE Final    Comment: (NOTE) SARS-CoV-2 target nucleic acids are NOT DETECTED.  The SARS-CoV-2 RNA is generally detectable in upper respiratory specimens during the acute phase of infection. The lowest concentration of SARS-CoV-2 viral copies this assay can detect is 138 copies/mL. A negative result does not preclude SARS-Cov-2 infection and should not be used as the sole basis for treatment or other patient management decisions. A negative result may occur with  improper specimen collection/handling, submission of specimen other than nasopharyngeal swab, presence of viral mutation(s) within the areas targeted by this assay, and inadequate number of viral copies(<138 copies/mL). A negative result must be combined with clinical observations, patient  history, and epidemiological information. The expected result is Negative.  Fact Sheet for Patients:  bloggercourse.com  Fact Sheet for Healthcare Providers:  seriousbroker.it  This test is no t yet approved or cleared by the [place]  FDA and  has been authorized for detection and/or diagnosis of SARS-CoV-2 by FDA under an Emergency Use Authorization (EUA). This EUA will remain  in effect (meaning this test can be used) for the duration of the COVID-19 declaration under Section 564(b)(1) of the Act, 21 U.S.C.section 360bbb-3(b)(1), unless the authorization is terminated  or revoked sooner.       Influenza A by PCR NEGATIVE NEGATIVE Final   Influenza B by PCR NEGATIVE NEGATIVE Final    Comment: (NOTE) The Xpert Xpress SARS-CoV-2/FLU/RSV plus assay is intended as an aid in the diagnosis of influenza from Nasopharyngeal swab specimens and should not be used as a sole basis for treatment. Nasal washings and aspirates are unacceptable for Xpert Xpress SARS-CoV-2/FLU/RSV testing.  Fact Sheet for Patients: bloggercourse.com  Fact Sheet for Healthcare Providers: seriousbroker.it  This test is not yet approved or cleared by the [place]  FDA and has been authorized for detection and/or diagnosis of SARS-CoV-2 by FDA under an Emergency Use Authorization (EUA). This EUA will remain in effect (meaning this test can be used) for the duration of the COVID-19 declaration under Section 564(b)(1) of the Act, 21 U.S.C. section 360bbb-3(b)(1), unless the authorization is terminated or revoked.     Resp Syncytial Virus by PCR POSITIVE (A) NEGATIVE Final    Comment: (NOTE) Fact Sheet for Patients: bloggercourse.com  Fact Sheet for Healthcare Providers: seriousbroker.it  This test is not yet approved or cleared by the [place]   FDA and has been authorized for detection and/or diagnosis of SARS-CoV-2 by FDA under an Emergency Use Authorization (EUA). This EUA will remain in effect (meaning this test can be used) for the duration of the COVID-19 declaration under Section 564(b)(1) of the Act, 21 U.S.C. section 360bbb-3(b)(1), unless the authorization is terminated or revoked.  Performed at [facility], [address] [place], [place] [address]   C Difficile Quick Screen w PCR reflex     Status: Abnormal   Collection Time: 10/30/24 11:10 PM   Specimen: STOOL  Result Value Ref Range Status   C Diff antigen POSITIVE (A) NEGATIVE Final   C Diff toxin POSITIVE (A) NEGATIVE Final  C Diff interpretation Toxin producing C. difficile detected.  Final    Comment: CRITICAL RESULT CALLED TO, READ BACK BY AND VERIFIED WITH: RN S.GOUGE AT 9066 ON 10/31/2024 B T.SAAD. Performed at [facility], [address], [place], [place] [address]       Radiology Studies: No results found.      Scheduled Meds:  enoxaparin  (LOVENOX ) injection  40 mg Subcutaneous Q24H   liothyronine   5 mcg Oral BID   sertraline   50 mg Oral Daily   vancomycin  (VANCOCIN ) 500 mg in sodium chloride  irrigation 0.9 % 100 mL ENEMA  500 mg Rectal Q6H   vancomycin   500 mg Oral Q6H   Continuous Infusions:  sodium chloride  100 mL/hr at 11/05/24 1013   metronidazole  500 mg (11/05/24 0634)     LOS: 4 days   Time spent: 25 minutes   Delon Hoe, DO [facility] 11/05/2024, 12:19 PM   Available via Epic secure chat 7am-7pm After these hours, please refer to coverage provider listed on [email or web address]  "

## 2024-11-06 DIAGNOSIS — J9811 Atelectasis: Secondary | ICD-10-CM

## 2024-11-06 DIAGNOSIS — J210 Acute bronchiolitis due to respiratory syncytial virus: Secondary | ICD-10-CM

## 2024-11-06 DIAGNOSIS — J90 Pleural effusion, not elsewhere classified: Secondary | ICD-10-CM

## 2024-11-06 DIAGNOSIS — A0472 Enterocolitis due to Clostridium difficile, not specified as recurrent: Secondary | ICD-10-CM | POA: Diagnosis not present

## 2024-11-06 DIAGNOSIS — K567 Ileus, unspecified: Secondary | ICD-10-CM | POA: Diagnosis not present

## 2024-11-06 DIAGNOSIS — K5100 Ulcerative (chronic) pancolitis without complications: Secondary | ICD-10-CM | POA: Diagnosis not present

## 2024-11-06 LAB — CBC
HCT: 32.7 % — ABNORMAL LOW (ref 36.0–46.0)
Hemoglobin: 11 g/dL — ABNORMAL LOW (ref 12.0–15.0)
MCH: 31.4 pg (ref 26.0–34.0)
MCHC: 33.6 g/dL (ref 30.0–36.0)
MCV: 93.4 fL (ref 80.0–100.0)
Platelets: 329 K/uL (ref 150–400)
RBC: 3.5 MIL/uL — ABNORMAL LOW (ref 3.87–5.11)
RDW: 12.5 % (ref 11.5–15.5)
WBC: 11.9 K/uL — ABNORMAL HIGH (ref 4.0–10.5)
nRBC: 0 % (ref 0.0–0.2)

## 2024-11-06 LAB — BASIC METABOLIC PANEL WITH GFR
Anion gap: 7 (ref 5–15)
BUN: 5 mg/dL — ABNORMAL LOW (ref 6–20)
CO2: 21 mmol/L — ABNORMAL LOW (ref 22–32)
Calcium: 7.4 mg/dL — ABNORMAL LOW (ref 8.9–10.3)
Chloride: 110 mmol/L (ref 98–111)
Creatinine, Ser: 0.59 mg/dL (ref 0.44–1.00)
GFR, Estimated: >60 mL/min
Glucose, Bld: 92 mg/dL (ref 70–99)
Potassium: 3.4 mmol/L — ABNORMAL LOW (ref 3.5–5.1)
Sodium: 139 mmol/L (ref 135–145)

## 2024-11-06 LAB — MAGNESIUM: Magnesium: 2.3 mg/dL (ref 1.7–2.4)

## 2024-11-06 MED ORDER — POTASSIUM CHLORIDE 20 MEQ PO PACK
60.0000 meq | PACK | Freq: Once | ORAL | Status: AC
  Administered 2024-11-06: 60 meq via ORAL
  Filled 2024-11-06: qty 3

## 2024-11-06 NOTE — Progress Notes (Signed)
 "        [facility]  Date of Admission:  10/31/2024      Abx: Po vanc Enema vanc   ASSESSMENT: Severe cdiff Ileus   12/31 assessment Wbc improving after starting enema/flagyl  Afebrile   Ileus seems to be improving Appreciate gi help  Patient passing flatus and eating -- asking to go home   Rsv relatively assymptomatic  PLAN: Reasonable to continue tx as suggested by GI Probably could stop enema given return of bowel function Maintain enteric cdiff isolation precaution Will sign off Discussed with primary team  Principal Problem:   C. difficile colitis Active Problems:   Hypothyroidism   Chronic migraine without aura without status migrainosus, not intractable   Generalized anxiety disorder   Primary insomnia   Intractable nausea and vomiting   RSV infection   Leukocytosis   Atelectasis   Acute bronchiolitis due to respiratory syncytial virus (RSV)   Allergies[1]  Scheduled Meds:  enoxaparin  (LOVENOX ) injection  40 mg Subcutaneous Q24H   liothyronine   5 mcg Oral BID   sertraline   50 mg Oral Daily   vancomycin  (VANCOCIN ) 500 mg in sodium chloride  irrigation 0.9 % 100 mL ENEMA  500 mg Rectal Q6H   vancomycin   500 mg Oral Q6H   Continuous Infusions:  sodium chloride  100 mL/hr at 11/06/24 1703   PRN Meds:.acetaminophen  **OR** acetaminophen , albuterol , diazepam , prochlorperazine , promethazine    SUBJECTIVE: Feels less bloated Afebrile Iv flagyl  stopped today  Wants to go home  Abd bloating resolved  Review of Systems: ROS All other ROS was negative, except mentioned above     OBJECTIVE: Vitals:   11/05/24 1551 11/05/24 2104 11/06/24 0606 11/06/24 1419  BP: 122/80 128/72 125/63 127/84  Pulse: 80 91 83 93  Resp: 18 16 16 18   Temp: 98.2 F (36.8 C) (!) 97.5 F (36.4 C) 98 F (36.7 C) 98.2 F (36.8 C)  TempSrc: Oral Oral Oral Oral  SpO2: 100% 100% 99% 97%  Weight:      Height:       Body mass index  is 27.78 kg/m.  Physical Exam  General/constitutional: no distress, pleasant HEENT: Normocephalic, PER, Conj Clear, EOMI, Oropharynx clear Neck supple CV: rrr no mrg Lungs: clear to auscultation, normal respiratory effort Abd: Soft, Nontender Ext: no edema Skin: No Rash Neuro: nonfocal MSK: no peripheral joint swelling/tenderness/warmth; back spines nontender   Lab Results Lab Results  Component Value Date   WBC 11.9 (H) 11/06/2024   HGB 11.0 (L) 11/06/2024   HCT 32.7 (L) 11/06/2024   MCV 93.4 11/06/2024   PLT 329 11/06/2024    Lab Results  Component Value Date   CREATININE 0.59 11/06/2024   BUN 5 (L) 11/06/2024   NA 139 11/06/2024   K 3.4 (L) 11/06/2024   CL 110 11/06/2024   CO2 21 (L) 11/06/2024    Lab Results  Component Value Date   ALT 81 (H) 10/30/2024   AST 42 (H) 10/30/2024   ALKPHOS 100 10/30/2024   BILITOT 0.3 10/30/2024      Microbiology: Recent Results (from the past 240 hours)  Resp panel by RT-PCR (RSV, Flu A&B, Covid) Anterior Nasal Swab     Status: Abnormal   Collection Time: 10/30/24  8:49 PM   Specimen: Anterior Nasal Swab  Result Value Ref Range Status   SARS Coronavirus 2 by RT PCR NEGATIVE NEGATIVE Final    Comment: (NOTE) SARS-CoV-2 target nucleic acids are NOT DETECTED.  The SARS-CoV-2 RNA is generally  detectable in upper respiratory specimens during the acute phase of infection. The lowest concentration of SARS-CoV-2 viral copies this assay can detect is 138 copies/mL. A negative result does not preclude SARS-Cov-2 infection and should not be used as the sole basis for treatment or other patient management decisions. A negative result may occur with  improper specimen collection/handling, submission of specimen other than nasopharyngeal swab, presence of viral mutation(s) within the areas targeted by this assay, and inadequate number of viral copies(<138 copies/mL). A negative result must be combined with clinical observations,  patient history, and epidemiological information. The expected result is Negative.  Fact Sheet for Patients:  bloggercourse.com  Fact Sheet for Healthcare Providers:  seriousbroker.it  This test is no t yet approved or cleared by the [place]  FDA and  has been authorized for detection and/or diagnosis of SARS-CoV-2 by FDA under an Emergency Use Authorization (EUA). This EUA will remain  in effect (meaning this test can be used) for the duration of the COVID-19 declaration under Section 564(b)(1) of the Act, 21 U.S.C.section 360bbb-3(b)(1), unless the authorization is terminated  or revoked sooner.       Influenza A by PCR NEGATIVE NEGATIVE Final   Influenza B by PCR NEGATIVE NEGATIVE Final    Comment: (NOTE) The Xpert Xpress SARS-CoV-2/FLU/RSV plus assay is intended as an aid in the diagnosis of influenza from Nasopharyngeal swab specimens and should not be used as a sole basis for treatment. Nasal washings and aspirates are unacceptable for Xpert Xpress SARS-CoV-2/FLU/RSV testing.  Fact Sheet for Patients: bloggercourse.com  Fact Sheet for Healthcare Providers: seriousbroker.it  This test is not yet approved or cleared by the [place]  FDA and has been authorized for detection and/or diagnosis of SARS-CoV-2 by FDA under an Emergency Use Authorization (EUA). This EUA will remain in effect (meaning this test can be used) for the duration of the COVID-19 declaration under Section 564(b)(1) of the Act, 21 U.S.C. section 360bbb-3(b)(1), unless the authorization is terminated or revoked.     Resp Syncytial Virus by PCR POSITIVE (A) NEGATIVE Final    Comment: (NOTE) Fact Sheet for Patients: bloggercourse.com  Fact Sheet for Healthcare Providers: seriousbroker.it  This test is not yet approved or cleared by the [place]  [place] FDA and has been authorized for detection and/or diagnosis of SARS-CoV-2 by FDA under an Emergency Use Authorization (EUA). This EUA will remain in effect (meaning this test can be used) for the duration of the COVID-19 declaration under Section 564(b)(1) of the Act, 21 U.S.C. section 360bbb-3(b)(1), unless the authorization is terminated or revoked.  Performed at [facility], [address] [place], [place] [address]   C Difficile Quick Screen w PCR reflex     Status: Abnormal   Collection Time: 10/30/24 11:10 PM   Specimen: STOOL  Result Value Ref Range Status   C Diff antigen POSITIVE (A) NEGATIVE Final   C Diff toxin POSITIVE (A) NEGATIVE Final   C Diff interpretation Toxin producing C. difficile detected.  Final    Comment: CRITICAL RESULT CALLED TO, READ BACK BY AND VERIFIED WITH: RN S.GOUGE AT 9066 ON 10/31/2024 B T.SAAD. Performed at [facility], [address], [place], [place] [address]      Serology:   Imaging: If present, new imagings (plain films, ct scans, and mri) have been personally visualized and interpreted; radiology reports have been reviewed. Decision making incorporated into the Impression / Recommendations.  12/30 kub 1. Findings consistent with ileus without bowel  dilatation.   Constance ONEIDA Passer, MD [facility] [phone] pager    11/06/2024, 6:11 PM     [1]  Allergies Allergen Reactions   Claritin [Loratadine]     shakiness   Latex Itching   Lexapro  [Escitalopram ] Nausea Only   "

## 2024-11-06 NOTE — Progress Notes (Addendum)
 "   Progress Note   LOS: 5 days   Chief Complaint: C. difficile colitis   Subjective   Patient ate solid lunch and ate chicken yesterday without difficulty.  Has had continued bowel movements but states she is not passing gas.  States her abdomen remains distended and bloating.  Denies nausea/vomiting.  She is somnolent today at the time of my evaluation and she recently received sleep medication.  No shortness of breath, no chest pain, no bleeding    Objective   Vital signs in last 24 hours: Temp:  [97.5 F (36.4 C)-98.2 F (36.8 C)] 98 F (36.7 C) (12/31 0606) Pulse Rate:  [80-91] 83 (12/31 0606) Resp:  [16-18] 16 (12/31 0606) BP: (122-128)/(63-80) 125/63 (12/31 0606) SpO2:  [99 %-100 %] 99 % (12/31 0606) Last BM Date : 11/05/24 Last BM recorded by nurses in past 5 days Stool Type: Type 7 (Liquid consistency with no solid pieces) (11/01/2024  5:00 AM)  General:   female in no acute distress  Heart:  Regular rate and rhythm; no murmurs Pulm: Clear anteriorly; no wheezing Abdomen: Soft, mildly distended, positive bowel signs in all 4 quadrants. Extremities:  No edema Neurologic:  Alert and  oriented x4;  No focal deficits.  Psych:  Cooperative. Normal mood and affect.  Lethargic  Intake/Output from previous day: 12/30 0701 - 12/31 0700 In: 1763.5 [P.O.:350; I.V.:1213.5; IV Piggyback:200] Out: 0  Intake/Output this shift: No intake/output data recorded.  Studies/Results: DG CHEST PORT 1 VIEW Result Date: 11/05/2024 CLINICAL DATA:  Shortness of breath. EXAM: PORTABLE CHEST 1 VIEW COMPARISON:  10/31/2024 FINDINGS: Development of left pleural effusion and ill-defined left lung base opacity. The heart is normal in size. No pulmonary edema. No pneumothorax. On limited assessment, no acute osseous findings. IMPRESSION: Development of left pleural effusion and ill-defined left lung base opacity, atelectasis versus pneumonia. Electronically Signed   By: Andrea Gasman M.D.   On:  11/05/2024 23:25   DG Abd Portable 1V Result Date: 11/05/2024 EXAM: 1 VIEW XRAY OF THE ABDOMEN 11/05/2024 11:05:00 AM COMPARISON: 11/03/2024 CLINICAL HISTORY: Ileus (HCC) FINDINGS: BOWEL: Centrally located air-filled small bowel loops with mild increased gas, nonspecific but consistent with ileus. No bowel dilatation. SOFT TISSUES: No abnormal calcifications. BONES: No acute fracture. IMPRESSION: 1. Findings consistent with ileus without bowel dilatation. Electronically signed by: Kate Plummer MD 11/05/2024 02:42 PM EST RP Workstation: [id number]    Lab Results: Recent Labs    11/04/24 0447 11/05/24 0506 11/06/24 0526  WBC 21.5* 20.2* 11.9*  HGB 11.9* 11.4* 11.0*  HCT 35.2* 33.3* 32.7*  PLT 296 296 329   BMET Recent Labs    11/04/24 0447 11/05/24 0506 11/06/24 0526  NA 131* 134* 139  K 3.1* 3.4* 3.4*  CL 104 107 110  CO2 18* 19* 21*  GLUCOSE 110* 118* 92  BUN 7 5* 5*  CREATININE 0.46 0.52 0.59  CALCIUM 6.8* 7.2* 7.4*   LFT No results for input(s): PROT, ALBUMIN, AST, ALT, ALKPHOS, BILITOT, BILIDIR, IBILI in the last 72 hours. PT/INR No results for input(s): LABPROT, INR in the last 72 hours.   Scheduled Meds:  enoxaparin  (LOVENOX ) injection  40 mg Subcutaneous Q24H   liothyronine   5 mcg Oral BID   sertraline   50 mg Oral Daily   vancomycin  (VANCOCIN ) 500 mg in sodium chloride  irrigation 0.9 % 100 mL ENEMA  500 mg Rectal Q6H   vancomycin   500 mg Oral Q6H   Continuous Infusions:  sodium chloride  100 mL/hr at 11/06/24  9194   metronidazole  500 mg (11/06/24 0631)      Patient profile:   48 year old female diagnosed with pancolitis per CTAP 10/06/2024 s/p course of Augmentin  with diarrhea that developed soon after and positive C. difficile 10/10/2024 as an outpatient.  Given course of vancomycin  125 4 times daily for 10 days and the diarrhea recurred 10/29/2024.  Presented to ED 12/24 with nausea vomiting diarrhea and abdominal pain with CTAP  showing pancolitis unchanged compared to prior CT late November.  Given Dificid  200 mg twice daily for 10 days discharged home and after her first dose of Dificid  12/25 she was readmitted for persistent nausea vomiting diarrhea and generalized abdominal pain consistent with C. difficile colitis.  ID consulted, Dificid  discontinued 12/29 started on vancomycin  500 mg every 6 hours, vancomycin  500 mg enemas every 6 hours and Flagyl  IV every 8 hours    Impression/Plan   C. difficile colitis Failed outpatient vancomycin  and Dificid  now on vancomycin  500 mg every 6 hours, vancomycin  500 mg enemas every 6 hours and Flagyl  IV every 8 hours Improvement in WBC currently 11.9 (down from 20.2) Reported improvement in bowel movements - Continue clear liquid diet secondary to ileus - IV fluids and pain management per hospitalist - Continue Compazine  10 mg IV every 6 hours as needed and Phenergan  12.5 mg p.o. every 6 hours as needed - CBC/CMP daily  Ileus Abdominal x-ray 12/28 and 12/30 showing ileus - Clear liquid diet - Keep k+ > 4 < 5 and magnesium 2 to 2.5 - serial abdominal xrays  Pleural effusion CXR with left pleural effusion and ill-defined left lung base atelectasis versus pneumonia - Antibiotics per hospitalist  RSV positive 12/24, mild intermittent nonproductive cough    Bayley M McMichael  11/06/2024, 10:04 AM    Attending physician's note   I have taken a history, reviewed the chart, and examined the patient. I performed a substantive portion of this encounter, including complete performance of at least one of the key components, in conjunction with the APP. I agree with the APP's note, impression, and recommendations with my edits.  Feeling better today.  Tolerated solid food yesterday, but changed back to liquids when repeat x-ray again showed ileus.  Asking to again advance diet.  No more nausea/vomiting.  Stool frequency decreasing and more formed.  WBC much improved to 11.9.   CXR with left pleural effusion and likely atelectasis.  - Will discontinue metronidazole  given clinical improvement - Continue oral vancomycin  500 mg 4 times daily with a plan for 14 days - Provided she is tolerating oral intake and no further clinical signs of ileus, will then be able to stop rectal vancomycin  - Advance diet back to soft and slowly advance as tolerated - CBC and BMP check in the morning with electrolyte repletion per protocol - Supportive care for RSV - Reviewed CXR results with the Hospitalist.  Looks more like atelectasis rather than pneumonia and very hopeful to avoid adding any additional antibiotics - Incentive spirometry, ambulation  Elishah Ashmore, DO, FACG [phone] office   A total of 50 minutes of time was spent on this encounter, including in depth chart review, independent review of results as outlined above, communicating results with the patient directly, face-to-face time with the patient, coordinating care, and ordering studies and medications as appropriate, and documentation.           "

## 2024-11-06 NOTE — Plan of Care (Signed)

## 2024-11-06 NOTE — Progress Notes (Signed)
 " PROGRESS NOTE    Lisa Townsend  FMW:[id number] DOB: 04-May-1976 DOA: 10/31/2024 PCP: Maude Rocker, PA-C    Brief Narrative:   Lisa Townsend is a 48 y.o. female with past medical history significant for hypothyroidism, anxiety, migraine headache who presented to [facility] ED on 10/31/2024 with complaint of persistent abdominal pain associate with nausea, vomiting and diarrhea.  Patient reports recently treated with a course of Augmentin  for colitis at the bating of December (seen in the ED 11/30).  She reportedly had follow-up with GI and was feeling better after finishing antibiotics.  2 days prior to admission, patient reports recurrence of the abdominal pain with reported 20 episodes of diarrhea., that significant progressed in which she sought further evaluation in the ED.  In the ED, C. difficile antigen and toxin positive.  CT ab/pelvis with contrast with pancolitis similar to 10/06/2024, small airway infection/inflammation posterior right lower lobe.  RSV PCR positive.  Patient was started on Dificid .  GI and ID were consulted.  TRH was consulted, patient was transferred to [facility] for further evaluation and management.  Assessment & Plan:   C. difficile colitis, severe Patient presenting to ED with recurrent abdominal pain associated with excessive diarrhea, 20 episodes over the past 2-3 days.  Recently completed course of Augmentin  outpatient for colitis.  Recently tested positive for C. difficile October 15, 2024 and started on oral vancomycin .  Initially her symptoms improved, but worsened on December 19.  Patient was afebrile with elevated WBC count of 21.8.  Seen by infectious ease, Dr. Overton on 11/04/2024; with recommendation of starting oral vancomycin /vancomycin  as needed IV Flagyl .  Discussed ( Dr. Rojelio) with general surgery, Dr. Rubin 12/29, no need for surgical intervention at this time. -- [facility] following, appreciate assistance -- WBC  21.8>21.9>17.7>19.4>21.5>20.2>11.9 -- Vancomycin  500 mg PO q6h -- Vancomycin  500 mgPR every 6 hours -- Discontinue IV metronidazole  today -- Diet advance to soft diet today -- NS at 100 mL/h -- Phenergan /Compazine  as needed nausea -- Strict I's and O's, monitor bowel movements  -- Enteric precautions  -- CBC in am  RSV -- Supportive care -- Droplet precaution  Hypokalemia Potassium 3.4, will continue repletion. --Repeat electrolytes in a.m.   Insomnia -- Valium  10 mg p.o. nightly insomnia   Generalized anxiety disorder --Sertraline  50 mg p.o. daily   Hypothyroidism -- Liothyronine  5 mg p.o. twice daily     DVT prophylaxis: enoxaparin  (LOVENOX ) injection 40 mg Start: 10/31/24 2200    Code Status: Full Code Family Communication: No family present at bedside this morning  Disposition Plan:  Level of care: Med-Surg Status is: Inpatient Remains inpatient appropriate because: Rectal vancomycin , IV fluids, needs to tolerate further diet advancement and GI signed off before stable for discharge home    Consultants:  [facility] Infectious disease, Dr. Overton Previous hospitalist, Dr. Rojelio discussed with [facility], Dr. Rubin  Procedures:  None  Antimicrobials:  Dificid  12/25 - 12/29 Vancomycin  PO/PR 12/29>> Metronidazole  12/29 - 12/30   Subjective: Patient seen examined bedside, lying in bed.  Reports improved frequency/quantity of bowel movements, 2 over the past 24 hours.  Discussed with GI, Dr. San this morning.  Okay to discontinue metronidazole  and continue oral/rectal vancomycin .  Advancing to soft diet.  Patient with no other specific questions, concerns at this time.  Denies headache, no dizziness, no chest pain, no palpitation, no shortness of breath, no current abdominal pain, no fever/chills/night sweats, no nausea cefonicid diarrhea, no focal weakness, no  fatigue, no paresthesia.  No acute events overnight per nursing  staff.  Objective: Vitals:   11/05/24 0533 11/05/24 1551 11/05/24 2104 11/06/24 0606  BP: 108/75 122/80 128/72 125/63  Pulse: 87 80 91 83  Resp: 18 18 16 16   Temp: 97.7 F (36.5 C) 98.2 F (36.8 C) (!) 97.5 F (36.4 C) 98 F (36.7 C)  TempSrc: Oral Oral Oral Oral  SpO2: 97% 100% 100% 99%  Weight:      Height:        Intake/Output Summary (Last 24 hours) at 11/06/2024 1416 Last data filed at 11/06/2024 1000 Gross per 24 hour  Intake 1108.33 ml  Output 0 ml  Net 1108.33 ml   Filed Weights   11/04/24 0956  Weight: 66.7 kg    Examination:  Physical Exam: GEN: NAD, alert and oriented x 3, wd/wn HEENT: NCAT, PERRL, EOMI, sclera clear, MMM PULM: CTAB w/o wheezes/crackles, normal respiratory effort, room air with SpO2 99% at rest CV: RRR w/o M/G/R GI: abd soft, NTND, + BS MSK: no peripheral edema, moves all extremities with preserved muscle strength NEURO: No focal neurological deficit PSYCH: normal mood/affect Integumentary: No concerning rashes/skin/wounds noted on exposed skin surfaces    Data Reviewed: I have personally reviewed following labs and imaging studies  CBC: Recent Labs  Lab 10/31/24 1042 11/01/24 0428 11/02/24 0507 11/03/24 0517 11/04/24 0447 11/05/24 0506 11/06/24 0526  WBC 21.8*   < > 17.7* 19.4* 21.5* 20.2* 11.9*  NEUTROABS 19.6*  --   --   --   --   --   --   HGB 14.9   < > 14.1 12.6 11.9* 11.4* 11.0*  HCT 43.7   < > 42.0 37.4 35.2* 33.3* 32.7*  MCV 92.6   < > 94.2 93.3 93.1 92.0 93.4  PLT 312   < > 261 262 296 296 329   < > = values in this interval not displayed.   Basic Metabolic Panel: Recent Labs  Lab 10/31/24 1042 11/01/24 0428 11/02/24 0507 11/03/24 0517 11/04/24 0447 11/05/24 0506 11/06/24 0526  NA 137 134* 136 132* 131* 134* 139  K 3.7 3.4* 3.8 3.5 3.1* 3.4* 3.4*  CL 99 103 102 102 104 107 110  CO2 25 22 19* 22 18* 19* 21*  GLUCOSE 147* 123* 103* 113* 110* 118* 92  BUN 9 11 14 9 7  5* 5*  CREATININE 0.77 0.66 0.75  0.61 0.46 0.52 0.59  CALCIUM 8.0* 6.7* 7.5* 7.1* 6.8* 7.2* 7.4*  MG 2.0 1.9 2.3  --   --  2.4 2.3   GFR: Estimated Creatinine Clearance: 74.4 mL/min (by C-G formula based on SCr of 0.59 mg/dL). Liver Function Tests: Recent Labs  Lab 10/30/24 1543  AST 42*  ALT 81*  ALKPHOS 100  BILITOT 0.3  PROT 6.4*  ALBUMIN 4.0   Recent Labs  Lab 10/30/24 1543  LIPASE 18   No results for input(s): AMMONIA in the last 168 hours. Coagulation Profile: No results for input(s): INR, PROTIME in the last 168 hours. Cardiac Enzymes: No results for input(s): CKTOTAL, CKMB, CKMBINDEX, TROPONINI in the last 168 hours. BNP (last 3 results) No results for input(s): PROBNP in the last 8760 hours. HbA1C: No results for input(s): HGBA1C in the last 72 hours. CBG: No results for input(s): GLUCAP in the last 168 hours. Lipid Profile: No results for input(s): CHOL, HDL, LDLCALC, TRIG, CHOLHDL, LDLDIRECT in the last 72 hours. Thyroid  Function Tests: No results for input(s): TSH, T4TOTAL, FREET4, T3FREE, THYROIDAB  in the last 72 hours. Anemia Panel: No results for input(s): VITAMINB12, FOLATE, FERRITIN, TIBC, IRON, RETICCTPCT in the last 72 hours. Sepsis Labs: No results for input(s): PROCALCITON, LATICACIDVEN in the last 168 hours.  Recent Results (from the past 240 hours)  Resp panel by RT-PCR (RSV, Flu A&B, Covid) Anterior Nasal Swab     Status: Abnormal   Collection Time: 10/30/24  8:49 PM   Specimen: Anterior Nasal Swab  Result Value Ref Range Status   SARS Coronavirus 2 by RT PCR NEGATIVE NEGATIVE Final    Comment: (NOTE) SARS-CoV-2 target nucleic acids are NOT DETECTED.  The SARS-CoV-2 RNA is generally detectable in upper respiratory specimens during the acute phase of infection. The lowest concentration of SARS-CoV-2 viral copies this assay can detect is 138 copies/mL. A negative result does not preclude SARS-Cov-2 infection and  should not be used as the sole basis for treatment or other patient management decisions. A negative result may occur with  improper specimen collection/handling, submission of specimen other than nasopharyngeal swab, presence of viral mutation(s) within the areas targeted by this assay, and inadequate number of viral copies(<138 copies/mL). A negative result must be combined with clinical observations, patient history, and epidemiological information. The expected result is Negative.  Fact Sheet for Patients:  bloggercourse.com  Fact Sheet for Healthcare Providers:  seriousbroker.it  This test is no t yet approved or cleared by the [place]  FDA and  has been authorized for detection and/or diagnosis of SARS-CoV-2 by FDA under an Emergency Use Authorization (EUA). This EUA will remain  in effect (meaning this test can be used) for the duration of the COVID-19 declaration under Section 564(b)(1) of the Act, 21 U.S.C.section 360bbb-3(b)(1), unless the authorization is terminated  or revoked sooner.       Influenza A by PCR NEGATIVE NEGATIVE Final   Influenza B by PCR NEGATIVE NEGATIVE Final    Comment: (NOTE) The Xpert Xpress SARS-CoV-2/FLU/RSV plus assay is intended as an aid in the diagnosis of influenza from Nasopharyngeal swab specimens and should not be used as a sole basis for treatment. Nasal washings and aspirates are unacceptable for Xpert Xpress SARS-CoV-2/FLU/RSV testing.  Fact Sheet for Patients: bloggercourse.com  Fact Sheet for Healthcare Providers: seriousbroker.it  This test is not yet approved or cleared by the [place]  FDA and has been authorized for detection and/or diagnosis of SARS-CoV-2 by FDA under an Emergency Use Authorization (EUA). This EUA will remain in effect (meaning this test can be used) for the duration of the COVID-19 declaration  under Section 564(b)(1) of the Act, 21 U.S.C. section 360bbb-3(b)(1), unless the authorization is terminated or revoked.     Resp Syncytial Virus by PCR POSITIVE (A) NEGATIVE Final    Comment: (NOTE) Fact Sheet for Patients: bloggercourse.com  Fact Sheet for Healthcare Providers: seriousbroker.it  This test is not yet approved or cleared by the [place]  FDA and has been authorized for detection and/or diagnosis of SARS-CoV-2 by FDA under an Emergency Use Authorization (EUA). This EUA will remain in effect (meaning this test can be used) for the duration of the COVID-19 declaration under Section 564(b)(1) of the Act, 21 U.S.C. section 360bbb-3(b)(1), unless the authorization is terminated or revoked.  Performed at [facility], [address] [place], [place] [address]   C Difficile Quick Screen w PCR reflex     Status: Abnormal   Collection Time: 10/30/24 11:10 PM   Specimen: STOOL  Result Value Ref Range Status   C  Diff antigen POSITIVE (A) NEGATIVE Final   C Diff toxin POSITIVE (A) NEGATIVE Final   C Diff interpretation Toxin producing C. difficile detected.  Final    Comment: CRITICAL RESULT CALLED TO, READ BACK BY AND VERIFIED WITH: RN S.GOUGE AT 9066 ON 10/31/2024 B T.SAAD. Performed at [facility], [address], [place], [place] [address]          Radiology Studies: DG CHEST PORT 1 VIEW Result Date: 11/05/2024 CLINICAL DATA:  Shortness of breath. EXAM: PORTABLE CHEST 1 VIEW COMPARISON:  10/31/2024 FINDINGS: Development of left pleural effusion and ill-defined left lung base opacity. The heart is normal in size. No pulmonary edema. No pneumothorax. On limited assessment, no acute osseous findings. IMPRESSION: Development of left pleural effusion and ill-defined left lung base opacity, atelectasis versus pneumonia. Electronically Signed   By: Andrea Gasman M.D.   On: 11/05/2024 23:25   DG  Abd Portable 1V Result Date: 11/05/2024 EXAM: 1 VIEW XRAY OF THE ABDOMEN 11/05/2024 11:05:00 AM COMPARISON: 11/03/2024 CLINICAL HISTORY: Ileus (HCC) FINDINGS: BOWEL: Centrally located air-filled small bowel loops with mild increased gas, nonspecific but consistent with ileus. No bowel dilatation. SOFT TISSUES: No abnormal calcifications. BONES: No acute fracture. IMPRESSION: 1. Findings consistent with ileus without bowel dilatation. Electronically signed by: Morgane Naveau MD 11/05/2024 02:42 PM EST RP Workstation: [id number]        Scheduled Meds:  enoxaparin  (LOVENOX ) injection  40 mg Subcutaneous Q24H   liothyronine   5 mcg Oral BID   sertraline   50 mg Oral Daily   vancomycin  (VANCOCIN ) 500 mg in sodium chloride  irrigation 0.9 % 100 mL ENEMA  500 mg Rectal Q6H   vancomycin   500 mg Oral Q6H   Continuous Infusions:  sodium chloride  100 mL/hr at 11/06/24 0805     LOS: 5 days    Time spent: 52 minutes spent on 11/06/2024 caring for this patient face-to-face including chart review, ordering labs/tests, documenting, discussion with nursing staff, consultants, updating family and interview/physical exam    Camellia PARAS Jalin Alicea, DO [facility] Available via Epic secure chat 7am-7pm After these hours, please refer to coverage provider listed on [email or web address] 11/06/2024, 2:16 PM   "

## 2024-11-07 DIAGNOSIS — A0472 Enterocolitis due to Clostridium difficile, not specified as recurrent: Secondary | ICD-10-CM | POA: Diagnosis not present

## 2024-11-07 DIAGNOSIS — B974 Respiratory syncytial virus as the cause of diseases classified elsewhere: Secondary | ICD-10-CM | POA: Diagnosis not present

## 2024-11-07 DIAGNOSIS — K5100 Ulcerative (chronic) pancolitis without complications: Secondary | ICD-10-CM | POA: Diagnosis not present

## 2024-11-07 DIAGNOSIS — E876 Hypokalemia: Secondary | ICD-10-CM

## 2024-11-07 LAB — BASIC METABOLIC PANEL WITH GFR
Anion gap: 7 (ref 5–15)
BUN: <5 mg/dL — ABNORMAL LOW (ref 6–20)
CO2: 22 mmol/L (ref 22–32)
Calcium: 7.2 mg/dL — ABNORMAL LOW (ref 8.9–10.3)
Chloride: 111 mmol/L (ref 98–111)
Creatinine, Ser: 0.44 mg/dL (ref 0.44–1.00)
GFR, Estimated: >60 mL/min
Glucose, Bld: 93 mg/dL (ref 70–99)
Potassium: 3.3 mmol/L — ABNORMAL LOW (ref 3.5–5.1)
Sodium: 139 mmol/L (ref 135–145)

## 2024-11-07 LAB — CBC
HCT: 32.5 % — ABNORMAL LOW (ref 36.0–46.0)
Hemoglobin: 11.2 g/dL — ABNORMAL LOW (ref 12.0–15.0)
MCH: 31.6 pg (ref 26.0–34.0)
MCHC: 34.5 g/dL (ref 30.0–36.0)
MCV: 91.8 fL (ref 80.0–100.0)
Platelets: 376 K/uL (ref 150–400)
RBC: 3.54 MIL/uL — ABNORMAL LOW (ref 3.87–5.11)
RDW: 12.5 % (ref 11.5–15.5)
WBC: 8.6 K/uL (ref 4.0–10.5)
nRBC: 0 % (ref 0.0–0.2)

## 2024-11-07 LAB — PHOSPHORUS: Phosphorus: 2.5 mg/dL (ref 2.5–4.6)

## 2024-11-07 LAB — MAGNESIUM: Magnesium: 1.9 mg/dL (ref 1.7–2.4)

## 2024-11-07 MED ORDER — ALUM & MAG HYDROXIDE-SIMETH 200-200-20 MG/5ML PO SUSP
30.0000 mL | ORAL | Status: DC | PRN
  Administered 2024-11-07: 30 mL via ORAL
  Filled 2024-11-07: qty 30

## 2024-11-07 MED ORDER — VANCOMYCIN HCL 250 MG PO CAPS: 500.0000 mg | ORAL_CAPSULE | Freq: Four times a day (QID) | ORAL | 0 refills | Status: DC

## 2024-11-07 MED ORDER — SACCHAROMYCES BOULARDII 250 MG PO CAPS: 250.0000 mg | ORAL_CAPSULE | Freq: Two times a day (BID) | ORAL | 0 refills | Status: AC

## 2024-11-07 MED ORDER — POTASSIUM CHLORIDE CRYS ER 20 MEQ PO TBCR
60.0000 meq | EXTENDED_RELEASE_TABLET | Freq: Once | ORAL | Status: AC
  Administered 2024-11-07: 60 meq via ORAL
  Filled 2024-11-07: qty 3

## 2024-11-07 NOTE — Progress Notes (Addendum)
 [facility] ROUNDING NOTE   Subjective: No acute events overnight.  Tolerating p.o. intake and hoping to advance diet.  WBC now normal at 8.6.   Objective: Vital signs in last 24 hours: Temp:  [98 F (36.7 C)-98.2 F (36.8 C)] 98.2 F (36.8 C) (01/01 0609) Pulse Rate:  [79-93] 79 (01/01 0609) Resp:  [16-18] 16 (01/01 0609) BP: (120-131)/(79-84) 120/79 (01/01 0609) SpO2:  [97 %-99 %] 98 % (01/01 0609) Last BM Date : 11/05/24 General: NAD    Intake/Output from previous day: 12/31 0701 - 01/01 0700 In: 3640 [P.O.:840; I.V.:2800] Out: 0  Intake/Output this shift: No intake/output data recorded.   Lab Results: Recent Labs    11/05/24 0506 11/06/24 0526 11/07/24 0519  WBC 20.2* 11.9* 8.6  HGB 11.4* 11.0* 11.2*  PLT 296 329 376  MCV 92.0 93.4 91.8   BMET Recent Labs    11/05/24 0506 11/06/24 0526 11/07/24 0519  NA 134* 139 139  K 3.4* 3.4* 3.3*  CL 107 110 111  CO2 19* 21* 22  GLUCOSE 118* 92 93  BUN 5* 5* <5*  CREATININE 0.52 0.59 0.44  CALCIUM 7.2* 7.4* 7.2*   LFT No results for input(s): PROT, ALBUMIN, AST, ALT, ALKPHOS, BILITOT, BILIDIR, IBILI in the last 72 hours. PT/INR No results for input(s): INR in the last 72 hours.    Imaging/Other results: DG CHEST PORT 1 VIEW Result Date: 11/05/2024 CLINICAL DATA:  Shortness of breath. EXAM: PORTABLE CHEST 1 VIEW COMPARISON:  10/31/2024 FINDINGS: Development of left pleural effusion and ill-defined left lung base opacity. The heart is normal in size. No pulmonary edema. No pneumothorax. On limited assessment, no acute osseous findings. IMPRESSION: Development of left pleural effusion and ill-defined left lung base opacity, atelectasis versus pneumonia. Electronically Signed   By: Andrea Gasman M.D.   On: 11/05/2024 23:25   DG Abd Portable 1V Result Date: 11/05/2024 EXAM: 1 VIEW XRAY OF THE ABDOMEN 11/05/2024 11:05:00 AM COMPARISON: 11/03/2024 CLINICAL HISTORY: Ileus (HCC)  FINDINGS: BOWEL: Centrally located air-filled small bowel loops with mild increased gas, nonspecific but consistent with ileus. No bowel dilatation. SOFT TISSUES: No abnormal calcifications. BONES: No acute fracture. IMPRESSION: 1. Findings consistent with ileus without bowel dilatation. Electronically signed by: Morgane Naveau MD 11/05/2024 02:42 PM EST RP Workstation: [id number]      Assessment and Plan:  1) C. difficile colitis 2) Leukocytosis-resolved 3) Diarrhea-improving 4) Hypokalemia - WBC normalized now at 8.6.  Flagyl  DC'd yesterday.  Now on vancomycin  oral monotherapy. - Continue advancing diet as tolerated - Anticipate she should be able to discharge home today - Please provide discharge Rx for vancomycin  p.o. 500 mg 4 times daily to complete 14-day course - Continue Florastor and recommended continued probiotic for 4 weeks beyond completion of vancomycin  course - Will arrange for outpatient follow-up in the GI clinic - K repletion per protocol  5) RSV - Continue incentive spirometry as outpatient - Encouraged ambulation  Inpatient GI service will sign off at this time     Sandor LULLA Flatter, DO  11/07/2024, 9:42 AM  Gastroenterology Pager [phone]  A total of 35 minutes of time was spent on this encounter, including in depth chart review, independent review of results as outlined above, communicating results with the patient directly, face-to-face time with the patient, coordinating care, and ordering studies and medications as appropriate, and documentation.

## 2024-11-07 NOTE — Discharge Summary (Signed)
 " Physician Discharge Summary  Lisa Townsend FMW:[id number] DOB: 1976/02/21 DOA: 10/31/2024  PCP: Maude Rocker, PA-C  Admit date: 10/31/2024 Discharge date: 11/07/2024  Admitted From: Home Disposition: Home  Recommendations for Outpatient Follow-up:  Follow up with PCP in 1-2 weeks Follow-up with gastroenterology, Dr. San as needed Continue high-dose vancomycin  500 mg p.o. every 4 hours to complete 14-day course for severe C. difficile colitis Continue Florastor for at least 2 weeks following completion of vancomycin  Repeat BMP 1 week to ensure electrolytes remained stable.  Home Health: No Equipment/Devices: None  Discharge Condition: Stable CODE STATUS: Full code Diet recommendation: Regular diet  History of present illness:  Lisa Townsend is a 49 y.o. female with past medical history significant for hypothyroidism, anxiety, migraine headache who presented to [facility] ED on 10/31/2024 with complaint of persistent abdominal pain associate with nausea, vomiting and diarrhea.  Patient reports recently treated with a course of Augmentin  for colitis at the bating of December (seen in the ED 11/30).  She reportedly had follow-up with GI and was feeling better after finishing antibiotics.  2 days prior to admission, patient reports recurrence of the abdominal pain with reported 20 episodes of diarrhea., that significant progressed in which she sought further evaluation in the ED.   In the ED, C. difficile antigen and toxin positive.  CT ab/pelvis with contrast with pancolitis similar to 10/06/2024, small airway infection/inflammation posterior right lower lobe.  RSV PCR positive.  Patient was started on Dificid .  GI and ID were consulted.  TRH was consulted, patient was transferred to [facility] for further evaluation and management.  Hospital course:  C. difficile colitis, severe Patient presenting to ED with recurrent abdominal pain associated  with excessive diarrhea, 20 episodes over the past 2-3 days.  Recently completed course of Augmentin  outpatient for colitis.  Recently tested positive for C. difficile October 15, 2024 and started on oral vancomycin .  Initially her symptoms improved, but worsened on December 19.  Patient was afebrile with elevated WBC count of 21.8.  Seen by infectious ease, Dr. Overton on 11/04/2024; with recommendation of starting oral vancomycin /vancomycin  as needed IV Flagyl .  Discussed ( Dr. Rojelio) with general surgery, Dr. Rubin 12/29, no need for surgical intervention at this time.  [place] [facility], Dr. Honor was consulted and followed during hospital course.  Patient was started on aggressive treatment with oral vancomycin  5 mg p.o. every 6 hours, vancomycin  5 mg per rectum every 6 hours and IV metronidazole .  Patient's symptoms and WBC count slowly improved and eventually IV metronidazole  and rectal vancomycin  was discontinued.  WBC count improved to 8.6 at time of discharge.  Will continue oral vancomycin  5 mg p.o. every 6 hours to complete 14-day course.  Will continue probiotic with Florastor for at least 2 weeks following completion of oral vancomycin .  Outpatient follow-up PCP/gastroenterology.   RSV Not oxygen dependent.  Supportive care.  Hypokalemia Repleted during hospitalization.  Should now improved given stability and treatment of C. difficile as above.   Insomnia Valium  10 mg p.o. nightly insomnia   Generalized anxiety disorder Sertraline  50 mg p.o. daily   Hypothyroidism Liothyronine  5 mg p.o. twice daily  Discharge Diagnoses:  Principal Problem:   C. difficile colitis Active Problems:   Intractable nausea and vomiting   RSV infection   Hypothyroidism   Chronic migraine without aura without status migrainosus, not intractable   Generalized anxiety disorder   Primary insomnia   Leukocytosis   Atelectasis   Acute  bronchiolitis due to respiratory syncytial virus (RSV)    Hypokalemia    Discharge Instructions  Discharge Instructions     Call MD for:  difficulty breathing, headache or visual disturbances   Complete by: As directed    Call MD for:  extreme fatigue   Complete by: As directed    Call MD for:  persistant dizziness or light-headedness   Complete by: As directed    Call MD for:  persistant nausea and vomiting   Complete by: As directed    Call MD for:  severe uncontrolled pain   Complete by: As directed    Call MD for:  temperature >100.4   Complete by: As directed    Increase activity slowly   Complete by: As directed       Allergies as of 11/07/2024       Reactions   Claritin [loratadine]    shakiness   Latex Itching   Lexapro  [escitalopram ] Nausea Only        Medication List     STOP taking these medications    fidaxomicin  200 MG Tabs tablet Commonly known as: DIFICID        TAKE these medications    AMBULATORY NON FORMULARY MEDICATION Medication Name: Diltiazem 2%/Lidocaine  2% Using your index finger apply a small amount of medication inside the anal opening and to the external anal area twice daily x 6 weeks.   diazepam  10 MG tablet Commonly known as: VALIUM  TAKE 1/2 TO 1 TABLET(5 TO 10 MG) BY MOUTH AT BEDTIME AS NEEDED FOR SLEEP   dicyclomine  20 MG tablet Commonly known as: BENTYL  Take 1 tablet (20 mg total) by mouth 4 (four) times daily -  before meals and at bedtime.   EPINEPHrine  0.3 mg/0.3 mL Soaj injection Commonly known as: EPI-PEN Inject 0.3 mg into the muscle once as needed for up to 1 dose for anaphylaxis.   estradiol  0.1 MG/GM vaginal cream Commonly known as: ESTRACE  APPLY 1/2 GRAM TO VULVA NIGHTLY FOR 2 WEEKS THEN DECREASE TO 1/2 GRAM TO VULVA TWO NIGHTS A WEEK   estradiol -norethindrone  0.05-0.14 MG/DAY Commonly known as: COMBIPATCH Place 1 patch onto the skin 2 (two) times a week.   HYDROcodone -acetaminophen  5-325 MG tablet Commonly known as: NORCO/VICODIN Take 2 tablets by mouth every  6 (six) hours as needed.   levothyroxine  50 MCG tablet Commonly known as: SYNTHROID  Take 50 mcg by mouth daily before breakfast.   liothyronine  5 MCG tablet Commonly known as: CYTOMEL  Take 5 mcg by mouth 2 (two) times daily.   NONFORMULARY OR COMPOUNDED ITEM Compounded Testosterone  PLO GEL 4% Apply pea sized amount to inner thigh qhs   ondansetron  4 MG disintegrating tablet Commonly known as: ZOFRAN -ODT Take 1 tablet (4 mg total) by mouth every 8 (eight) hours as needed.   saccharomyces boulardii 250 MG capsule Commonly known as: Florastor Take 1 capsule (250 mg total) by mouth 2 (two) times daily.   sertraline  50 MG tablet Commonly known as: ZOLOFT  Take 50 mg by mouth daily.   vancomycin  250 MG capsule Commonly known as: VANCOCIN  Take 2 capsules (500 mg total) by mouth 4 (four) times daily for 11 days.        Follow-up Information     Laberge, Meagan, PA-C. Schedule an appointment as soon as possible for a visit in 1 week(s).   Specialty: Physician Assistant Contact information: [address] [place] [address] [phone]         San Sandor GAILS, DO. Schedule  an appointment as soon as possible for a visit.   Specialty: Gastroenterology Why: As needed Contact information: [address] [place] [address] [phone]                Allergies[1]  Consultations: [facility], Dr. San Infectious disease, Dr. Overton Previous hospitalist, Dr. Rojelio discussed with [facility], Dr. Rubin   Procedures/Studies: DG CHEST PORT 1 VIEW Result Date: 11/05/2024 CLINICAL DATA:  Shortness of breath. EXAM: PORTABLE CHEST 1 VIEW COMPARISON:  10/31/2024 FINDINGS: Development of left pleural effusion and ill-defined left lung base opacity. The heart is normal in size. No pulmonary edema. No pneumothorax. On limited assessment, no acute osseous findings. IMPRESSION: Development of left pleural effusion and ill-defined left lung base  opacity, atelectasis versus pneumonia. Electronically Signed   By: Andrea Gasman M.D.   On: 11/05/2024 23:25   DG Abd Portable 1V Result Date: 11/05/2024 EXAM: 1 VIEW XRAY OF THE ABDOMEN 11/05/2024 11:05:00 AM COMPARISON: 11/03/2024 CLINICAL HISTORY: Ileus (HCC) FINDINGS: BOWEL: Centrally located air-filled small bowel loops with mild increased gas, nonspecific but consistent with ileus. No bowel dilatation. SOFT TISSUES: No abnormal calcifications. BONES: No acute fracture. IMPRESSION: 1. Findings consistent with ileus without bowel dilatation. Electronically signed by: Morgane Naveau MD 11/05/2024 02:42 PM EST RP Workstation: [id number]   DG Abd 1 View Result Date: 11/03/2024 CLINICAL DATA:  Ileus.  Abdominal pain. EXAM: DG ABDOMEN 1V COMPARISON:  CT 10/30/2024 FINDINGS: Few air-filled loops of small bowel in the central abdomen. No abnormal small bowel distension. No significant formed stool in the colon. No evidence of free air on supine views. No radiopaque calculi. IMPRESSION: Few air-filled loops of small bowel in the central abdomen, nonspecific, may represent ileus. No abnormal small bowel distension. Electronically Signed   By: Andrea Gasman M.D.   On: 11/03/2024 17:16   DG CHEST PORT 1 VIEW Result Date: 10/31/2024 EXAM: 1 VIEW(S) XRAY OF THE CHEST 10/31/2024 04:18:00 PM COMPARISON: 08/05/2014 CLINICAL HISTORY: RSV (acute bronchiolitis due to respiratory syncytial virus) FINDINGS: LUNGS AND PLEURA: 1.3 cm nodular opacity in right upper lung zone, unchanged, benign. No pleural effusion. No pneumothorax. HEART AND MEDIASTINUM: No acute abnormality of the cardiac and mediastinal silhouettes. BONES AND SOFT TISSUES: No acute osseous abnormality. IMPRESSION: 1. No acute cardiopulmonary abnormality. Electronically signed by: Rogelia Myers MD 10/31/2024 07:29 PM EST RP Workstation: [id number]   CT ABDOMEN PELVIS W CONTRAST Result Date: 10/30/2024 EXAM: CT ABDOMEN AND PELVIS WITH CONTRAST  10/30/2024 09:34:01 PM TECHNIQUE: CT of the abdomen and pelvis was performed with the administration of 100 mL of iohexol  (OMNIPAQUE ) 300 MG/ML solution. Multiplanar reformatted images are provided for review. Automated exposure control, iterative reconstruction, and/or weight-based adjustment of the mA/kV was utilized to reduce the radiation dose to as low as reasonably achievable. COMPARISON: CT evidence dated 10/06/2024. CLINICAL HISTORY: Abdominal pain, acute, nonlocalized. FINDINGS: LOWER CHEST: New bronchiolectasis and associated ground-glass opacities and interstitial thickening in a wedge-shaped distribution in the posterior right lower lobe. LIVER: The liver is unremarkable. GALLBLADDER AND BILE DUCTS: Gallbladder is unremarkable. No biliary ductal dilatation. SPLEEN: No acute abnormality. PANCREAS: No acute abnormality. ADRENAL GLANDS: No acute abnormality. KIDNEYS, URETERS AND BLADDER: No stones in the kidneys or ureters. No hydronephrosis. No perinephric or periureteral stranding. Urinary bladder is unremarkable. GI AND BOWEL: Diffuse colonic wall thickening with mucosal hyperenhancement greatest about the ascending and transverse colon. The colitis is unchanged from the 10/06/2024. Normal appendix. Stomach demonstrates no acute abnormality. There is no bowel obstruction. PERITONEUM AND  RETROPERITONEUM: No ascites. No free air. VASCULATURE: Aorta is normal in caliber. LYMPH NODES: No lymphadenopathy. REPRODUCTIVE ORGANS: No acute abnormality. BONES AND SOFT TISSUES: No acute osseous abnormality. No focal soft tissue abnormality. IMPRESSION: 1. Pancolitis similar to 11 / 30 / 25. 2. Small airway infection / inflammation in the posterior right lower lobe. Electronically signed by: Norman Gatlin MD 10/30/2024 09:42 PM EST RP Workstation: [id number]     Subjective: Patient seen examined bedside, sitting at edge of bed.  Ready for discharge home.  Discussed with ID and GI, okay for discharge home on  high-dose oral vancomycin  to complete 14-day course for C. difficile colitis.  Updated patient's spouse via speaker phone in room.  No other questions or concerns at this time.  Denied headache, no dizziness, no chest pain, no palpitations, no shortness of breath, no abdominal pain, no fever/chills/night sweats, no current nausea/vomiting, no diarrhea, no abdominal pain, no focal weakness, no fatigue, no paresthesia.  No acute events overnight per nurse staff.  Discharge Exam: Vitals:   11/06/24 2141 11/07/24 0609  BP: 131/79 120/79  Pulse: 85 79  Resp: 18 16  Temp: 98 F (36.7 C) 98.2 F (36.8 C)  SpO2: 99% 98%   Vitals:   11/06/24 0606 11/06/24 1419 11/06/24 2141 11/07/24 0609  BP: 125/63 127/84 131/79 120/79  Pulse: 83 93 85 79  Resp: 16 18 18 16   Temp: 98 F (36.7 C) 98.2 F (36.8 C) 98 F (36.7 C) 98.2 F (36.8 C)  TempSrc: Oral Oral Oral Oral  SpO2: 99% 97% 99% 98%  Weight:      Height:        Physical Exam: GEN: NAD, alert and oriented x 3, wd/wn HEENT: NCAT, PERRL, EOMI, sclera clear, MMM PULM: CTAB w/o wheezes/crackles, normal respiratory effort, on room air with SpO2 98% at rest CV: RRR w/o M/G/R GI: abd soft, NTND, + BS MSK: no peripheral edema, muscle strength globally intact 5/5 bilateral upper/lower extremities NEURO: CN II-XII intact, no focal deficits, sensation to light touch intact PSYCH: normal mood/affect Integumentary: dry/intact, no rashes or wounds    The results of significant diagnostics from this hospitalization (including imaging, microbiology, ancillary and laboratory) are listed below for reference.     Microbiology: Recent Results (from the past 240 hours)  Resp panel by RT-PCR (RSV, Flu A&B, Covid) Anterior Nasal Swab     Status: Abnormal   Collection Time: 10/30/24  8:49 PM   Specimen: Anterior Nasal Swab  Result Value Ref Range Status   SARS Coronavirus 2 by RT PCR NEGATIVE NEGATIVE Final    Comment: (NOTE) SARS-CoV-2 target  nucleic acids are NOT DETECTED.  The SARS-CoV-2 RNA is generally detectable in upper respiratory specimens during the acute phase of infection. The lowest concentration of SARS-CoV-2 viral copies this assay can detect is 138 copies/mL. A negative result does not preclude SARS-Cov-2 infection and should not be used as the sole basis for treatment or other patient management decisions. A negative result may occur with  improper specimen collection/handling, submission of specimen other than nasopharyngeal swab, presence of viral mutation(s) within the areas targeted by this assay, and inadequate number of viral copies(<138 copies/mL). A negative result must be combined with clinical observations, patient history, and epidemiological information. The expected result is Negative.  Fact Sheet for Patients:  bloggercourse.com  Fact Sheet for Healthcare Providers:  seriousbroker.it  This test is no t yet approved or cleared by the [place]  FDA and  has been authorized for  detection and/or diagnosis of SARS-CoV-2 by FDA under an Emergency Use Authorization (EUA). This EUA will remain  in effect (meaning this test can be used) for the duration of the COVID-19 declaration under Section 564(b)(1) of the Act, 21 U.S.C.section 360bbb-3(b)(1), unless the authorization is terminated  or revoked sooner.       Influenza A by PCR NEGATIVE NEGATIVE Final   Influenza B by PCR NEGATIVE NEGATIVE Final    Comment: (NOTE) The Xpert Xpress SARS-CoV-2/FLU/RSV plus assay is intended as an aid in the diagnosis of influenza from Nasopharyngeal swab specimens and should not be used as a sole basis for treatment. Nasal washings and aspirates are unacceptable for Xpert Xpress SARS-CoV-2/FLU/RSV testing.  Fact Sheet for Patients: bloggercourse.com  Fact Sheet for Healthcare  Providers: seriousbroker.it  This test is not yet approved or cleared by the [place]  FDA and has been authorized for detection and/or diagnosis of SARS-CoV-2 by FDA under an Emergency Use Authorization (EUA). This EUA will remain in effect (meaning this test can be used) for the duration of the COVID-19 declaration under Section 564(b)(1) of the Act, 21 U.S.C. section 360bbb-3(b)(1), unless the authorization is terminated or revoked.     Resp Syncytial Virus by PCR POSITIVE (A) NEGATIVE Final    Comment: (NOTE) Fact Sheet for Patients: bloggercourse.com  Fact Sheet for Healthcare Providers: seriousbroker.it  This test is not yet approved or cleared by the [place]  FDA and has been authorized for detection and/or diagnosis of SARS-CoV-2 by FDA under an Emergency Use Authorization (EUA). This EUA will remain in effect (meaning this test can be used) for the duration of the COVID-19 declaration under Section 564(b)(1) of the Act, 21 U.S.C. section 360bbb-3(b)(1), unless the authorization is terminated or revoked.  Performed at [facility], [address] [place], [place] [address]   C Difficile Quick Screen w PCR reflex     Status: Abnormal   Collection Time: 10/30/24 11:10 PM   Specimen: STOOL  Result Value Ref Range Status   C Diff antigen POSITIVE (A) NEGATIVE Final   C Diff toxin POSITIVE (A) NEGATIVE Final   C Diff interpretation Toxin producing C. difficile detected.  Final    Comment: CRITICAL RESULT CALLED TO, READ BACK BY AND VERIFIED WITH: RN S.GOUGE AT 9066 ON 10/31/2024 B T.SAAD. Performed at [facility], [address] [place], [place] [address]      Labs: BNP (last 3 results) No results for input(s): BNP in the last 8760 hours. Basic Metabolic Panel: Recent Labs  Lab 11/01/24 0428 11/02/24 0507 11/03/24 0517 11/04/24 0447 11/05/24 0506  11/06/24 0526 11/07/24 0519  NA 134* 136 132* 131* 134* 139 139  K 3.4* 3.8 3.5 3.1* 3.4* 3.4* 3.3*  CL 103 102 102 104 107 110 111  CO2 22 19* 22 18* 19* 21* 22  GLUCOSE 123* 103* 113* 110* 118* 92 93  BUN 11 14 9 7  5* 5* <5*  CREATININE 0.66 0.75 0.61 0.46 0.52 0.59 0.44  CALCIUM 6.7* 7.5* 7.1* 6.8* 7.2* 7.4* 7.2*  MG 1.9 2.3  --   --  2.4 2.3 1.9  PHOS  --   --   --   --   --   --  2.5   Liver Function Tests: No results for input(s): AST, ALT, ALKPHOS, BILITOT, PROT, ALBUMIN in the last 168 hours. No results for input(s): LIPASE, AMYLASE in the last 168 hours. No results for input(s): AMMONIA in the last 168  hours. CBC: Recent Labs  Lab 11/03/24 0517 11/04/24 0447 11/05/24 0506 11/06/24 0526 11/07/24 0519  WBC 19.4* 21.5* 20.2* 11.9* 8.6  HGB 12.6 11.9* 11.4* 11.0* 11.2*  HCT 37.4 35.2* 33.3* 32.7* 32.5*  MCV 93.3 93.1 92.0 93.4 91.8  PLT 262 296 296 329 376   Cardiac Enzymes: No results for input(s): CKTOTAL, CKMB, CKMBINDEX, TROPONINI in the last 168 hours. BNP: Invalid input(s): POCBNP CBG: No results for input(s): GLUCAP in the last 168 hours. D-Dimer No results for input(s): DDIMER in the last 72 hours. Hgb A1c No results for input(s): HGBA1C in the last 72 hours. Lipid Profile No results for input(s): CHOL, HDL, LDLCALC, TRIG, CHOLHDL, LDLDIRECT in the last 72 hours. Thyroid  function studies No results for input(s): TSH, T4TOTAL, T3FREE, THYROIDAB in the last 72 hours.  Invalid input(s): FREET3 Anemia work up No results for input(s): VITAMINB12, FOLATE, FERRITIN, TIBC, IRON, RETICCTPCT in the last 72 hours. Urinalysis    Component Value Date/Time   COLORURINE YELLOW 10/30/2024 1711   APPEARANCEUR CLEAR 10/30/2024 1711   LABSPEC 1.025 10/30/2024 1711   PHURINE 5.5 10/30/2024 1711   GLUCOSEU NEGATIVE 10/30/2024 1711   HGBUR NEGATIVE 10/30/2024 1711   BILIRUBINUR NEGATIVE 10/30/2024  1711   BILIRUBINUR negative 10/06/2024 0938   BILIRUBINUR neg 08/05/2014 1423   KETONESUR 15 (A) 10/30/2024 1711   PROTEINUR NEGATIVE 10/30/2024 1711   UROBILINOGEN 0.2 10/06/2024 0938   UROBILINOGEN 0.2 11/03/2014 0928   NITRITE NEGATIVE 10/30/2024 1711   LEUKOCYTESUR NEGATIVE 10/30/2024 1711   Sepsis Labs Recent Labs  Lab 11/04/24 0447 11/05/24 0506 11/06/24 0526 11/07/24 0519  WBC 21.5* 20.2* 11.9* 8.6   Microbiology Recent Results (from the past 240 hours)  Resp panel by RT-PCR (RSV, Flu A&B, Covid) Anterior Nasal Swab     Status: Abnormal   Collection Time: 10/30/24  8:49 PM   Specimen: Anterior Nasal Swab  Result Value Ref Range Status   SARS Coronavirus 2 by RT PCR NEGATIVE NEGATIVE Final    Comment: (NOTE) SARS-CoV-2 target nucleic acids are NOT DETECTED.  The SARS-CoV-2 RNA is generally detectable in upper respiratory specimens during the acute phase of infection. The lowest concentration of SARS-CoV-2 viral copies this assay can detect is 138 copies/mL. A negative result does not preclude SARS-Cov-2 infection and should not be used as the sole basis for treatment or other patient management decisions. A negative result may occur with  improper specimen collection/handling, submission of specimen other than nasopharyngeal swab, presence of viral mutation(s) within the areas targeted by this assay, and inadequate number of viral copies(<138 copies/mL). A negative result must be combined with clinical observations, patient history, and epidemiological information. The expected result is Negative.  Fact Sheet for Patients:  bloggercourse.com  Fact Sheet for Healthcare Providers:  seriousbroker.it  This test is no t yet approved or cleared by the [place]  FDA and  has been authorized for detection and/or diagnosis of SARS-CoV-2 by FDA under an Emergency Use Authorization (EUA). This EUA will remain  in  effect (meaning this test can be used) for the duration of the COVID-19 declaration under Section 564(b)(1) of the Act, 21 U.S.C.section 360bbb-3(b)(1), unless the authorization is terminated  or revoked sooner.       Influenza A by PCR NEGATIVE NEGATIVE Final   Influenza B by PCR NEGATIVE NEGATIVE Final    Comment: (NOTE) The Xpert Xpress SARS-CoV-2/FLU/RSV plus assay is intended as an aid in the diagnosis of influenza from Nasopharyngeal swab specimens and should not  be used as a sole basis for treatment. Nasal washings and aspirates are unacceptable for Xpert Xpress SARS-CoV-2/FLU/RSV testing.  Fact Sheet for Patients: bloggercourse.com  Fact Sheet for Healthcare Providers: seriousbroker.it  This test is not yet approved or cleared by the [place]  FDA and has been authorized for detection and/or diagnosis of SARS-CoV-2 by FDA under an Emergency Use Authorization (EUA). This EUA will remain in effect (meaning this test can be used) for the duration of the COVID-19 declaration under Section 564(b)(1) of the Act, 21 U.S.C. section 360bbb-3(b)(1), unless the authorization is terminated or revoked.     Resp Syncytial Virus by PCR POSITIVE (A) NEGATIVE Final    Comment: (NOTE) Fact Sheet for Patients: bloggercourse.com  Fact Sheet for Healthcare Providers: seriousbroker.it  This test is not yet approved or cleared by the [place]  FDA and has been authorized for detection and/or diagnosis of SARS-CoV-2 by FDA under an Emergency Use Authorization (EUA). This EUA will remain in effect (meaning this test can be used) for the duration of the COVID-19 declaration under Section 564(b)(1) of the Act, 21 U.S.C. section 360bbb-3(b)(1), unless the authorization is terminated or revoked.  Performed at [facility], [address] [place], [place] [address]   C  Difficile Quick Screen w PCR reflex     Status: Abnormal   Collection Time: 10/30/24 11:10 PM   Specimen: STOOL  Result Value Ref Range Status   C Diff antigen POSITIVE (A) NEGATIVE Final   C Diff toxin POSITIVE (A) NEGATIVE Final   C Diff interpretation Toxin producing C. difficile detected.  Final    Comment: CRITICAL RESULT CALLED TO, READ BACK BY AND VERIFIED WITH: RN S.GOUGE AT 9066 ON 10/31/2024 B T.SAAD. Performed at [facility], [address] [place], [place] [address]      Time coordinating discharge: Over 30 minutes  SIGNED:   Camellia PARAS Magic Mohler, DO  [facility] 11/07/2024, 11:14 AM     [1]  Allergies Allergen Reactions   Claritin [Loratadine]     shakiness   Latex Itching   Lexapro  [Escitalopram ] Nausea Only   "

## 2024-11-13 NOTE — Progress Notes (Signed)
 "    11/15/2024 Lisa Townsend [id number] [birth year]  Referring provider: Maude Rocker, PA-C Primary GI doctor: Dr. San  ASSESSMENT AND PLAN:  Positive C. difficile 10/15/2024 with colitis seen on CT in the ER 11/30 10/06/2024 KUB with mild colonic stool burden 10/06/2024 CTAP W nonspecific infectious or inflammatory pancolitis normal terminal ileum no bowel obstruction stomach normal unremarkable liver, gallbladder 10/15/2024 positive C. Difficile treated vancomycin  4 times daily 125 for 4 days 12/25 through 1/1 hospital admission C. difficile colitis and hypokalemia discharged on vancomycin  500 mg 4 times daily 14-day course due to lack of response to 4 days of Dificid  - Educated on recurrence risk and advised to avoid amoxicillin . - Recommended separate bathroom use to reduce transmission. - Provided guidance on recurrence symptoms and instructed to notify if symptoms return. - Discussed future management options including vancomycin  taper and microbiome therapy (VOWST ) - Ordered abdominal x-ray to evaluate for complications or stool retention.  Iron deficiency anemia Chronic anemia with concern for iron deficiency; recent iron studies not performed. - Ordered repeat CBC and iron studies to reassess anemia and iron status.  Hypokalemia Recent hypokalemia and mild hypomagnesemia; monitoring for normalization. - Ordered repeat potassium and magnesium levels to reassess and monitor for normalization  Constipaton - check KUB -Consider pelvic floor PT  Elevated LFTs  CT abdomen pelvis 10/06/2024 unremarkable liver    Latest Ref Rng & Units 10/30/2024    3:43 PM 10/14/2024   10:15 AM 10/10/2024   11:23 AM  Hepatic Function  Total Protein 6.5 - 8.1 g/dL 6.4  6.2  5.7   Albumin 3.5 - 5.0 g/dL 4.0  3.7  3.4   AST 15 - 41 U/L 42  63  34   ALT 0 - 44 U/L 81  85  49   Alk Phosphatase 38 - 126 U/L 100  89  65   Total Bilirubin 0.0 - 1.2 mg/dL 0.3  0.3  0.3   Bilirubin,  Direct 0.0 - 0.3 mg/dL  0.0     Platelets 705 10/30/2024 FIB4= 0.95, low risk May be from acute illness, continue to monitor closely, consider RUQ elastography  [facility] 02/07/2022 colonoscopy 2 polyps 2 to 4 mm distal sigmoid transverse nonbleeding internal hemorrhoids normal TI good quality prep, benign colonic mucosa recall 10 years  Patient Care Team: Maude Rocker, PA-C as PCP - General (Physician Assistant) Luke Orlan HERO, DO as Consulting Physician (Allergy)  HISTORY OF PRESENT ILLNESS: 49 y.o. female with a past medical history listed below presents for evaluation of diarrhea, AB pain, colitis on CT, treated for C. difficile colitis, here for follow-up.   I last saw the patient in the office 10/10/2024 for abnormal CT showing colitis with diarrhea and abdominal pain.  Patient had positive C. difficile treated with vancomycin  125 4 times daily for 10 days.  Discussed the use of AI scribe software for clinical note transcription with the patient, who gave verbal consent to proceed.  History of Present Illness   Lisa Townsend is a 49 year old female with recent Clostridioides difficile colitis who presents for post-treatment follow-up and evaluation of ongoing gastrointestinal symptoms.  Completed a course of vancomycin  for C. difficile infection, finishing the medication last night. No bowel movements today, which is a change from prior diarrhea. Passing gas and describes abdominal distension as big, grody. Abdominal pain persists, with significant discomfort localized to the flanks and generalized pain worse on the sides. Ongoing chills are present, but no fever reported.  Nausea and vomiting occurred during recent hospitalization, with improvement over the past two days; intermittent nausea remains, resolving after taking medication. Eating and drinking have improved, and she denies dark urine.  Constipation was present prior to C. difficile infection, and she expresses concern that  current symptoms may be related to stool retention now that the infection has resolved. Generalized discomfort and pain throughout the abdomen and flanks persist.  Ongoing issues with anemia and low iron are noted, stating that's another thing that I'm always fighting with. One is not going to the bathroom and the other is my low iron. She recalls prior laboratory findings of low potassium, low magnesium, and elevated liver enzymes during hospitalization.  Expresses concern about possible C. difficile transmission to her child, who recently had ear surgery and received multiple courses of antibiotics; she shares a bathroom at home.      She  reports that she has never smoked. She has been exposed to tobacco smoke. She has never used smokeless tobacco. She reports current alcohol use. She reports that she does not use drugs.  RELEVANT GI HISTORY, IMAGING AND LABS: Results          CBC    Component Value Date/Time   WBC 8.6 11/07/2024 0519   RBC 3.54 (L) 11/07/2024 0519   HGB 11.2 (L) 11/07/2024 0519   HCT 32.5 (L) 11/07/2024 0519   PLT 376 11/07/2024 0519   MCV 91.8 11/07/2024 0519   MCV 97.5 (A) 08/05/2014 1423   MCH 31.6 11/07/2024 0519   MCHC 34.5 11/07/2024 0519   RDW 12.5 11/07/2024 0519   LYMPHSABS 0.9 10/31/2024 1042   MONOABS 1.1 (H) 10/31/2024 1042   EOSABS 0.0 10/31/2024 1042   BASOSABS 0.0 10/31/2024 1042   Recent Labs    10/10/24 1123 10/30/24 1543 10/31/24 1042 11/01/24 0428 11/02/24 0507 11/03/24 0517 11/04/24 0447 11/05/24 0506 11/06/24 0526 11/07/24 0519  HGB 12.2 13.6 14.9 14.8 14.1 12.6 11.9* 11.4* 11.0* 11.2*    CMP     Component Value Date/Time   NA 139 11/07/2024 0519   K 3.3 (L) 11/07/2024 0519   CL 111 11/07/2024 0519   CO2 22 11/07/2024 0519   GLUCOSE 93 11/07/2024 0519   BUN <5 (L) 11/07/2024 0519   CREATININE 0.44 11/07/2024 0519   CREATININE 0.92 06/08/2016 0925   CALCIUM 7.2 (L) 11/07/2024 0519   PROT 6.4 (L) 10/30/2024 1543    ALBUMIN 4.0 10/30/2024 1543   AST 42 (H) 10/30/2024 1543   ALT 81 (H) 10/30/2024 1543   ALKPHOS 100 10/30/2024 1543   BILITOT 0.3 10/30/2024 1543   GFRNONAA >60 11/07/2024 0519   GFRAA >90 07/26/2012 0352      Latest Ref Rng & Units 10/30/2024    3:43 PM 10/14/2024   10:15 AM 10/10/2024   11:23 AM  Hepatic Function  Total Protein 6.5 - 8.1 g/dL 6.4  6.2  5.7   Albumin 3.5 - 5.0 g/dL 4.0  3.7  3.4   AST 15 - 41 U/L 42  63  34   ALT 0 - 44 U/L 81  85  49   Alk Phosphatase 38 - 126 U/L 100  89  65   Total Bilirubin 0.0 - 1.2 mg/dL 0.3  0.3  0.3   Bilirubin, Direct 0.0 - 0.3 mg/dL  0.0        Current Medications:   Current Outpatient Medications (Endocrine & Metabolic):    estradiol -norethindrone  (COMBIPATCH) 0.05-0.14 MG/DAY, Place 1 patch onto the  skin 2 (two) times a week.   levothyroxine  (SYNTHROID ) 50 MCG tablet, Take 50 mcg by mouth daily before breakfast.   liothyronine  (CYTOMEL ) 5 MCG tablet, Take 5 mcg by mouth 2 (two) times daily.  Current Outpatient Medications (Cardiovascular):    EPINEPHrine  0.3 mg/0.3 mL IJ SOAJ injection, Inject 0.3 mg into the muscle once as needed for up to 1 dose for anaphylaxis.  Current Outpatient Medications (Analgesics):    HYDROcodone -acetaminophen  (NORCO/VICODIN) 5-325 MG tablet, Take 2 tablets by mouth every 6 (six) hours as needed.  Current Outpatient Medications (Other):    AMBULATORY NON FORMULARY MEDICATION, Medication Name: Diltiazem 2%/Lidocaine  2% Using your index finger apply a small amount of medication inside the anal opening and to the external anal area twice daily x 6 weeks.   diazepam  (VALIUM ) 10 MG tablet, TAKE 1/2 TO 1 TABLET(5 TO 10 MG) BY MOUTH AT BEDTIME AS NEEDED FOR SLEEP   dicyclomine  (BENTYL ) 20 MG tablet, Take 1 tablet (20 mg total) by mouth 4 (four) times daily -  before meals and at bedtime.   estradiol  (ESTRACE ) 0.1 MG/GM vaginal cream, APPLY 1/2 GRAM TO VULVA NIGHTLY FOR 2 WEEKS THEN DECREASE TO 1/2 GRAM TO VULVA  TWO NIGHTS A WEEK   NONFORMULARY OR COMPOUNDED ITEM, Compounded Testosterone  PLO GEL 4% Apply pea sized amount to inner thigh qhs   ondansetron  (ZOFRAN -ODT) 4 MG disintegrating tablet, Take 1 tablet (4 mg total) by mouth every 8 (eight) hours as needed.   saccharomyces boulardii (FLORASTOR) 250 MG capsule, Take 1 capsule (250 mg total) by mouth 2 (two) times daily.   sertraline  (ZOLOFT ) 50 MG tablet, Take 50 mg by mouth daily.  Medical History:  Past Medical History:  Diagnosis Date   Allergy    seasonal- allergy shots weekly to [facility]   Anxiety    Chronic constipation    daily to weekly constipation-  she states has a BM 2-3 x a week   Hypothyroidism    Iron deficiency anemia    Migraine    Urticaria    Vitamin D  deficiency    Allergies:  Allergies  Allergen Reactions   Claritin [Loratadine]     shakiness   Latex Itching   Lexapro  [Escitalopram ] Nausea Only     Surgical History:  She  has a past surgical history that includes Nose surgery; Breast biopsy (Left, 02/03/2014); and Cesarean section (N/A, 08/04/2021). Family History:  Her family history includes Alcohol abuse in her maternal uncle; Allergic rhinitis in her brother; Breast cancer in her maternal grandmother; Breast cancer (age of onset: 77) in her mother; Heart disease in her father; Hypertension in her mother; Prostate cancer in her paternal uncle; Stomach cancer in her paternal grandmother; Urolithiasis in her father.  REVIEW OF SYSTEMS  : All other systems reviewed and negative except where noted in the History of Present Illness.  PHYSICAL EXAM: BP 110/70   Pulse 81   Ht 5' 1 (1.549 m)   Wt 134 lb (60.8 kg)   BMI 25.32 kg/m  Physical Exam   GENERAL APPEARANCE: Well nourished, in no apparent distress. HEENT: No cervical lymphadenopathy, unremarkable thyroid , sclerae anicteric, conjunctiva pink. RESPIRATORY: Respiratory effort normal, breath sounds equal bilateral without rales, rhonchi, wheezing. CARDIO:  Regular rate and rhythm with no murmurs, rubs, or gallops, peripheral pulses intact. ABDOMEN: Soft, non-distended, active bowel sounds in all four quadrants, no tenderness to palpation, no rebound, no mass appreciated. RECTAL: Declines. MUSCULOSKELETAL: Full range of motion, normal gait, without edema. SKIN: Dry,  intact without rashes or lesions. No jaundice. NEURO: Alert, oriented, no focal deficits. PSYCH: Cooperative, normal mood and affect.      Alan JONELLE Coombs, PA-C 11:40 AM   "

## 2024-11-15 ENCOUNTER — Encounter: Payer: Self-pay | Admitting: Physician Assistant

## 2024-11-15 ENCOUNTER — Ambulatory Visit: Payer: Self-pay | Admitting: Physician Assistant

## 2024-11-15 ENCOUNTER — Ambulatory Visit
Admission: RE | Admit: 2024-11-15 | Discharge: 2024-11-15 | Disposition: A | Source: Ambulatory Visit | Attending: Physician Assistant | Admitting: Physician Assistant

## 2024-11-15 ENCOUNTER — Other Ambulatory Visit

## 2024-11-15 ENCOUNTER — Ambulatory Visit: Admitting: Physician Assistant

## 2024-11-15 VITALS — BP 110/70 | HR 81 | Ht 61.0 in | Wt 134.0 lb

## 2024-11-15 DIAGNOSIS — E876 Hypokalemia: Secondary | ICD-10-CM

## 2024-11-15 DIAGNOSIS — A0472 Enterocolitis due to Clostridium difficile, not specified as recurrent: Secondary | ICD-10-CM | POA: Diagnosis not present

## 2024-11-15 DIAGNOSIS — D509 Iron deficiency anemia, unspecified: Secondary | ICD-10-CM

## 2024-11-15 DIAGNOSIS — K5900 Constipation, unspecified: Secondary | ICD-10-CM

## 2024-11-15 DIAGNOSIS — R7989 Other specified abnormal findings of blood chemistry: Secondary | ICD-10-CM

## 2024-11-15 DIAGNOSIS — K5904 Chronic idiopathic constipation: Secondary | ICD-10-CM

## 2024-11-15 LAB — CBC WITH DIFFERENTIAL/PLATELET
Basophils Absolute: 0 K/uL (ref 0.0–0.1)
Basophils Relative: 0.6 % (ref 0.0–3.0)
Eosinophils Absolute: 0.3 K/uL (ref 0.0–0.7)
Eosinophils Relative: 4.6 % (ref 0.0–5.0)
HCT: 35.8 % — ABNORMAL LOW (ref 36.0–46.0)
Hemoglobin: 12.2 g/dL (ref 12.0–15.0)
Lymphocytes Relative: 28.2 % (ref 12.0–46.0)
Lymphs Abs: 1.7 K/uL (ref 0.7–4.0)
MCHC: 34.2 g/dL (ref 30.0–36.0)
MCV: 91.8 fl (ref 78.0–100.0)
Monocytes Absolute: 0.5 K/uL (ref 0.1–1.0)
Monocytes Relative: 8.1 % (ref 3.0–12.0)
Neutro Abs: 3.6 K/uL (ref 1.4–7.7)
Neutrophils Relative %: 58.5 % (ref 43.0–77.0)
Platelets: 533 K/uL — ABNORMAL HIGH (ref 150.0–400.0)
RBC: 3.89 Mil/uL (ref 3.87–5.11)
RDW: 13 % (ref 11.5–15.5)
WBC: 6.1 K/uL (ref 4.0–10.5)

## 2024-11-15 LAB — COMPREHENSIVE METABOLIC PANEL WITH GFR
ALT: 46 U/L — ABNORMAL HIGH (ref 3–35)
AST: 32 U/L (ref 5–37)
Albumin: 3.7 g/dL (ref 3.5–5.2)
Alkaline Phosphatase: 86 U/L (ref 39–117)
BUN: 7 mg/dL (ref 6–23)
CO2: 31 meq/L (ref 19–32)
Calcium: 8.7 mg/dL (ref 8.4–10.5)
Chloride: 102 meq/L (ref 96–112)
Creatinine, Ser: 0.62 mg/dL (ref 0.40–1.20)
GFR: 104.61 mL/min
Glucose, Bld: 83 mg/dL (ref 70–99)
Potassium: 4.4 meq/L (ref 3.5–5.1)
Sodium: 141 meq/L (ref 135–145)
Total Bilirubin: 0.3 mg/dL (ref 0.2–1.2)
Total Protein: 6.6 g/dL (ref 6.0–8.3)

## 2024-11-15 LAB — SEDIMENTATION RATE: Sed Rate: 20 mm/h (ref 0–20)

## 2024-11-15 LAB — IBC + FERRITIN
Ferritin: 117.5 ng/mL (ref 10.0–291.0)
Iron: 64 ug/dL (ref 42–145)
Saturation Ratios: 19.8 % — ABNORMAL LOW (ref 20.0–50.0)
TIBC: 323.4 ug/dL (ref 250.0–450.0)
Transferrin: 231 mg/dL (ref 212.0–360.0)

## 2024-11-15 LAB — VITAMIN B12: Vitamin B-12: 1190 pg/mL — ABNORMAL HIGH (ref 211–911)

## 2024-11-15 LAB — MAGNESIUM: Magnesium: 2 mg/dL (ref 1.5–2.5)

## 2024-11-15 NOTE — Patient Instructions (Addendum)
 Your provider has requested that you have an abdominal x ray before leaving today. Please go to the basement floor to our Radiology department for the test.  Your provider has requested that you go to the basement level for lab work before leaving today. Press B on the elevator. The lab is located at the first door on the left as you exit the elevator.   Due to recent changes in healthcare laws, you may see the results of your imaging and laboratory studies on MyChart before your provider has had a chance to review them.  We understand that in some cases there may be results that are confusing or concerning to you. Not all laboratory results come back in the same time frame and the provider may be waiting for multiple results in order to interpret others.  Please give us  48 hours in order for your provider to thoroughly review all the results before contacting the office for clarification of your results.    I appreciate the  opportunity to care for you  Thank You   [facility]

## 2024-11-21 ENCOUNTER — Telehealth: Payer: Self-pay | Admitting: Physician Assistant

## 2024-11-21 ENCOUNTER — Other Ambulatory Visit

## 2024-11-21 ENCOUNTER — Other Ambulatory Visit (INDEPENDENT_AMBULATORY_CARE_PROVIDER_SITE_OTHER)

## 2024-11-21 DIAGNOSIS — R7989 Other specified abnormal findings of blood chemistry: Secondary | ICD-10-CM | POA: Diagnosis not present

## 2024-11-21 DIAGNOSIS — A0472 Enterocolitis due to Clostridium difficile, not specified as recurrent: Secondary | ICD-10-CM

## 2024-11-21 LAB — HEPATIC FUNCTION PANEL
ALT: 38 U/L — ABNORMAL HIGH (ref 3–35)
AST: 25 U/L (ref 5–37)
Albumin: 4.2 g/dL (ref 3.5–5.2)
Alkaline Phosphatase: 93 U/L (ref 39–117)
Bilirubin, Direct: 0.1 mg/dL (ref 0.1–0.3)
Total Bilirubin: 0.7 mg/dL (ref 0.2–1.2)
Total Protein: 7.1 g/dL (ref 6.0–8.3)

## 2024-11-21 NOTE — Telephone Encounter (Signed)
 Inbound call from patient stating she's been up since 4am with pain and throwing up would like to speak to nurse and be advised on what to do  Please advise  Thank you

## 2024-11-21 NOTE — Telephone Encounter (Signed)
 See mychart, patient has written in twice & message was previously sent to the provider.

## 2024-11-21 NOTE — Telephone Encounter (Signed)
 Duplicate note

## 2024-11-22 ENCOUNTER — Ambulatory Visit: Payer: Self-pay | Admitting: Physician Assistant

## 2024-11-22 DIAGNOSIS — A0472 Enterocolitis due to Clostridium difficile, not specified as recurrent: Secondary | ICD-10-CM

## 2024-11-22 LAB — EPSTEIN-BARR VIRUS VCA ANTIBODY PANEL
EBV NA IgG: >600 U/mL — ABNORMAL HIGH
EBV VCA IgG: 471 U/mL — ABNORMAL HIGH
EBV VCA IgM: <36 U/mL

## 2024-11-22 LAB — C. DIFFICILE GDH AND TOXIN A/B
GDH ANTIGEN: DETECTED
MICRO NUMBER:: 17473891
SPECIMEN QUALITY:: ADEQUATE
TOXIN A AND B: DETECTED

## 2024-11-22 MED ORDER — VOWST PO CAPS: 4.0000 | ORAL_CAPSULE | Freq: Every day | ORAL | 0 refills | Status: AC

## 2024-11-22 MED ORDER — VANCOMYCIN HCL 125 MG PO CAPS: ORAL_CAPSULE | ORAL | 0 refills | Status: AC

## 2024-11-26 NOTE — Telephone Encounter (Signed)
 [facility]  paperwork completed and faxed to [phone] with patient's notes and result report.

## 2024-12-04 MED ORDER — LUBIPROSTONE 24 MCG PO CAPS: 24.0000 ug | ORAL_CAPSULE | Freq: Two times a day (BID) | ORAL | 0 refills | Status: AC

## 2024-12-04 NOTE — Addendum Note (Signed)
 Addended by: CRAIG PALMA on: 12/04/2024 01:19 PM   Modules accepted: Orders

## 2024-12-09 ENCOUNTER — Telehealth: Payer: Self-pay

## 2024-12-09 ENCOUNTER — Other Ambulatory Visit (HOSPITAL_COMMUNITY): Payer: Self-pay

## 2024-12-09 NOTE — Telephone Encounter (Signed)
 Pharmacy Patient Advocate Encounter   Received notification from Patient Advice Request messages that prior authorization for Amitiza  24 MCG Capsule is required/requested.   Insurance verification completed.   The patient is insured through [facility] [employer].   Per test claim: PA required; PA submitted to above mentioned insurance via Fax Key/confirmation #/EOC [id number] Status is pending

## 2024-12-09 NOTE — Telephone Encounter (Signed)
 PA request has been Submitted. New Encounter has been or will be created for follow up. For additional info see Pharmacy Prior Auth telephone encounter from 12-09-2024.

## 2024-12-18 ENCOUNTER — Encounter: Admitting: Medical

## 2024-12-24 ENCOUNTER — Ambulatory Visit: Admitting: Physician Assistant

## 2025-01-07 ENCOUNTER — Ambulatory Visit: Admitting: Physician Assistant
